# Patient Record
Sex: Female | Born: 1970 | Race: Black or African American | Hispanic: No | Marital: Married
Health system: Southern US, Academic
[De-identification: ages and names within clinical notes are randomized; demographics above are authoritative.]

## PROBLEM LIST (undated history)

## (undated) ENCOUNTER — Telehealth

## (undated) ENCOUNTER — Encounter

## (undated) ENCOUNTER — Ambulatory Visit: Payer: MEDICARE

## (undated) ENCOUNTER — Encounter: Attending: Nephrology | Primary: Nephrology

## (undated) ENCOUNTER — Ambulatory Visit: Payer: Medicare (Managed Care)

## (undated) ENCOUNTER — Ambulatory Visit

## (undated) ENCOUNTER — Other Ambulatory Visit

## (undated) ENCOUNTER — Inpatient Hospital Stay: Payer: Medicare (Managed Care)

## (undated) ENCOUNTER — Institutional Professional Consult (permissible substitution)

## (undated) ENCOUNTER — Inpatient Hospital Stay

## (undated) ENCOUNTER — Encounter: Attending: Adult Health | Primary: Adult Health

## (undated) ENCOUNTER — Ambulatory Visit: Payer: MEDICARE | Attending: Vascular Surgery | Primary: Vascular Surgery

## (undated) ENCOUNTER — Encounter: Attending: Registered Nurse | Primary: Registered Nurse

## (undated) ENCOUNTER — Telehealth: Attending: Internal Medicine | Primary: Internal Medicine

## (undated) ENCOUNTER — Telehealth
Attending: Student in an Organized Health Care Education/Training Program | Primary: Student in an Organized Health Care Education/Training Program

## (undated) ENCOUNTER — Encounter: Attending: Acute Care | Primary: Acute Care

## (undated) ENCOUNTER — Telehealth: Attending: Podiatrist | Primary: Podiatrist

## (undated) ENCOUNTER — Encounter
Payer: MEDICARE | Attending: Rehabilitative and Restorative Service Providers" | Primary: Rehabilitative and Restorative Service Providers"

## (undated) ENCOUNTER — Telehealth: Attending: Vascular Surgery | Primary: Vascular Surgery

## (undated) ENCOUNTER — Encounter
Attending: Student in an Organized Health Care Education/Training Program | Primary: Student in an Organized Health Care Education/Training Program

## (undated) ENCOUNTER — Ambulatory Visit: Attending: Podiatrist | Primary: Podiatrist

## (undated) ENCOUNTER — Telehealth: Attending: Nephrology | Primary: Nephrology

## (undated) ENCOUNTER — Ambulatory Visit: Attending: Adult Health | Primary: Adult Health

## (undated) ENCOUNTER — Encounter: Attending: Internal Medicine | Primary: Internal Medicine

## (undated) ENCOUNTER — Ambulatory Visit: Payer: MEDICARE | Attending: Psychologist | Primary: Psychologist

## (undated) ENCOUNTER — Ambulatory Visit: Attending: Internal Medicine | Primary: Internal Medicine

## (undated) ENCOUNTER — Ambulatory Visit
Payer: MEDICARE | Attending: Rehabilitative and Restorative Service Providers" | Primary: Rehabilitative and Restorative Service Providers"

## (undated) ENCOUNTER — Telehealth: Attending: Community Health | Primary: Community Health

## (undated) ENCOUNTER — Ambulatory Visit: Attending: Urology | Primary: Urology

## (undated) ENCOUNTER — Ambulatory Visit: Payer: MEDICARE | Attending: Obstetrics & Gynecology | Primary: Obstetrics & Gynecology

## (undated) ENCOUNTER — Encounter: Attending: Family | Primary: Family

## (undated) ENCOUNTER — Ambulatory Visit: Attending: Nephrology | Primary: Nephrology

## (undated) ENCOUNTER — Non-Acute Institutional Stay: Payer: MEDICARE

## (undated) ENCOUNTER — Encounter: Attending: Clinical | Primary: Clinical

## (undated) ENCOUNTER — Encounter: Attending: Vascular & Interventional Radiology | Primary: Vascular & Interventional Radiology

## (undated) ENCOUNTER — Encounter: Attending: Physical Medicine & Rehabilitation | Primary: Physical Medicine & Rehabilitation

## (undated) ENCOUNTER — Encounter: Payer: MEDICAID | Attending: Vascular Surgery | Primary: Vascular Surgery

## (undated) ENCOUNTER — Ambulatory Visit
Attending: Rehabilitative and Restorative Service Providers" | Primary: Rehabilitative and Restorative Service Providers"

## (undated) ENCOUNTER — Telehealth: Attending: Family | Primary: Family

## (undated) ENCOUNTER — Ambulatory Visit
Payer: Medicare (Managed Care) | Attending: Student in an Organized Health Care Education/Training Program | Primary: Student in an Organized Health Care Education/Training Program

## (undated) ENCOUNTER — Ambulatory Visit: Payer: MEDICARE | Attending: Clinical | Primary: Clinical

## (undated) ENCOUNTER — Non-Acute Institutional Stay
Payer: MEDICARE | Attending: Rehabilitative and Restorative Service Providers" | Primary: Rehabilitative and Restorative Service Providers"

## (undated) ENCOUNTER — Telehealth: Attending: Surgery | Primary: Surgery

## (undated) MED ORDER — APIXABAN 5 MG TABLET: Freq: Two times a day (BID) | ORAL | 0.00000 days | Status: SS

---
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
## 2017-04-30 ENCOUNTER — Ambulatory Visit
Admission: RE | Admit: 2017-04-30 | Discharge: 2017-04-30 | Disposition: A | Discharge status: Home / Self Care | Payer: MEDICAID | Attending: Nephrology | Admitting: Nephrology

## 2017-04-30 DIAGNOSIS — M549 Dorsalgia, unspecified: Secondary | ICD-10-CM

## 2017-04-30 DIAGNOSIS — N051 Unspecified nephritic syndrome with focal and segmental glomerular lesions: Principal | ICD-10-CM

## 2017-04-30 DIAGNOSIS — N183 Chronic kidney disease, stage 3 (moderate): Secondary | ICD-10-CM

## 2017-04-30 DIAGNOSIS — I150 Renovascular hypertension: Secondary | ICD-10-CM

## 2017-05-01 MED ORDER — LOSARTAN 50 MG TABLET
ORAL_TABLET | Freq: Every day | ORAL | 3 refills | 0.00000 days | Status: CP
Start: 2017-05-01 — End: 2018-05-08

## 2017-05-01 MED ORDER — LOSARTAN 50 MG TABLET: 25 mg | tablet | Freq: Every day | 3 refills | 0 days | Status: AC

## 2017-06-19 ENCOUNTER — Ambulatory Visit
Admit: 2017-06-19 | Discharge: 2017-06-19 | Disposition: A | Discharge status: Home / Self Care | Payer: MEDICAID | Attending: Family | Admitting: Family

## 2017-06-19 DIAGNOSIS — M109 Gout, unspecified: Principal | ICD-10-CM

## 2017-06-19 DIAGNOSIS — N183 Chronic kidney disease, stage 3 (moderate): Secondary | ICD-10-CM

## 2017-06-27 ENCOUNTER — Ambulatory Visit: Admission: RE | Admit: 2017-06-27 | Discharge: 2017-06-27 | Payer: MEDICAID

## 2017-06-27 DIAGNOSIS — L409 Psoriasis, unspecified: Principal | ICD-10-CM

## 2017-07-09 ENCOUNTER — Ambulatory Visit
Admission: RE | Admit: 2017-07-09 | Discharge: 2017-07-09 | Payer: MEDICAID | Attending: Internal Medicine | Admitting: Internal Medicine

## 2017-07-09 DIAGNOSIS — K529 Noninfective gastroenteritis and colitis, unspecified: Principal | ICD-10-CM

## 2017-07-09 DIAGNOSIS — R1013 Epigastric pain: Secondary | ICD-10-CM

## 2017-07-09 MED ORDER — CHOLESTYRAMINE-ASPARTAME 4 GRAM ORAL POWDER FOR SUSP IN A PACKET
PACK | Freq: Three times a day (TID) | ORAL | 3 refills | 0 days | Status: CP
Start: 2017-07-09 — End: 2018-07-01

## 2017-07-09 MED ORDER — HYOSCYAMINE SULFATE 0.125 MG TABLET
ORAL_TABLET | ORAL | 0 refills | 0 days | Status: CP | PRN
Start: 2017-07-09 — End: 2018-05-08

## 2017-08-13 ENCOUNTER — Emergency Department
Admission: EM | Admit: 2017-08-13 | Discharge: 2017-08-13 | Disposition: A | Discharge status: Home / Self Care | Source: Intra-hospital | Attending: Family | Admitting: Family

## 2017-08-13 ENCOUNTER — Emergency Department
Admission: EM | Admit: 2017-08-13 | Discharge: 2017-08-13 | Disposition: A | Discharge status: Home / Self Care | Payer: MEDICAID | Source: Intra-hospital

## 2017-08-13 DIAGNOSIS — R509 Fever, unspecified: Principal | ICD-10-CM

## 2017-08-13 MED ORDER — BENZONATATE 100 MG CAPSULE
ORAL_CAPSULE | Freq: Two times a day (BID) | ORAL | 0 refills | 0 days | Status: CP | PRN
Start: 2017-08-13 — End: 2017-08-20

## 2017-08-28 DIAGNOSIS — R197 Diarrhea, unspecified: Principal | ICD-10-CM

## 2017-08-29 ENCOUNTER — Ambulatory Visit
Admission: RE | Admit: 2017-08-29 | Discharge: 2017-08-29 | Disposition: A | Discharge status: Home / Self Care | Payer: MEDICAID | Attending: Anesthesiology | Admitting: Anesthesiology

## 2017-08-29 ENCOUNTER — Ambulatory Visit
Admission: RE | Admit: 2017-08-29 | Discharge: 2017-08-29 | Disposition: A | Discharge status: Home / Self Care | Payer: MEDICAID | Attending: Internal Medicine | Admitting: Internal Medicine

## 2017-08-29 DIAGNOSIS — R197 Diarrhea, unspecified: Principal | ICD-10-CM

## 2017-09-20 ENCOUNTER — Ambulatory Visit
Admit: 2017-09-20 | Discharge: 2017-09-20 | Disposition: A | Discharge status: Home / Self Care | Payer: MEDICAID | Attending: Registered Nurse | Admitting: Registered Nurse

## 2017-09-20 DIAGNOSIS — I129 Hypertensive chronic kidney disease with stage 1 through stage 4 chronic kidney disease, or unspecified chronic kidney disease: Principal | ICD-10-CM

## 2017-09-30 ENCOUNTER — Ambulatory Visit: Admit: 2017-09-30 | Discharge: 2017-09-30 | Disposition: A | Discharge status: Home / Self Care | Payer: MEDICAID

## 2017-10-09 ENCOUNTER — Ambulatory Visit
Admission: RE | Admit: 2017-10-09 | Discharge: 2017-10-09 | Disposition: A | Discharge status: Home / Self Care | Payer: MEDICAID

## 2017-10-09 DIAGNOSIS — N6452 Nipple discharge: Secondary | ICD-10-CM

## 2017-10-09 DIAGNOSIS — R928 Other abnormal and inconclusive findings on diagnostic imaging of breast: Principal | ICD-10-CM

## 2017-10-09 DIAGNOSIS — N644 Mastodynia: Principal | ICD-10-CM

## 2017-10-09 DIAGNOSIS — Z8543 Personal history of malignant neoplasm of ovary: Secondary | ICD-10-CM

## 2017-10-11 ENCOUNTER — Ambulatory Visit: Admit: 2017-10-11 | Discharge: 2017-10-11 | Payer: MEDICAID

## 2017-10-11 DIAGNOSIS — N644 Mastodynia: Principal | ICD-10-CM

## 2017-11-12 ENCOUNTER — Encounter: Admit: 2017-11-12 | Discharge: 2017-11-12 | Payer: MEDICARE | Attending: Registered Nurse | Primary: Registered Nurse

## 2017-11-12 DIAGNOSIS — I10 Essential (primary) hypertension: Principal | ICD-10-CM

## 2017-11-12 DIAGNOSIS — M109 Gout, unspecified: Secondary | ICD-10-CM

## 2018-01-13 ENCOUNTER — Encounter
Admit: 2018-01-13 | Discharge: 2018-01-13 | Payer: MEDICARE | Attending: Student in an Organized Health Care Education/Training Program | Primary: Student in an Organized Health Care Education/Training Program

## 2018-01-13 ENCOUNTER — Encounter: Admit: 2018-01-13 | Discharge: 2018-01-13 | Payer: MEDICARE

## 2018-01-13 DIAGNOSIS — N183 Chronic kidney disease, stage 3 (moderate): Principal | ICD-10-CM

## 2018-01-13 DIAGNOSIS — N051 Unspecified nephritic syndrome with focal and segmental glomerular lesions: Secondary | ICD-10-CM

## 2018-01-14 ENCOUNTER — Encounter
Admit: 2018-01-14 | Discharge: 2018-01-14 | Disposition: A | Discharge status: Home / Self Care | Payer: MEDICARE | Attending: Family

## 2018-01-14 DIAGNOSIS — S0990XA Unspecified injury of head, initial encounter: Principal | ICD-10-CM

## 2018-02-18 ENCOUNTER — Ambulatory Visit: Admit: 2018-02-18 | Discharge: 2018-02-19 | Payer: MEDICARE | Attending: Internal Medicine | Primary: Internal Medicine

## 2018-02-18 DIAGNOSIS — K580 Irritable bowel syndrome with diarrhea: Secondary | ICD-10-CM

## 2018-02-18 DIAGNOSIS — R109 Unspecified abdominal pain: Principal | ICD-10-CM

## 2018-02-18 MED ORDER — RIFAXIMIN 550 MG TABLET
ORAL_TABLET | Freq: Three times a day (TID) | ORAL | 0 refills | 0 days | Status: CP
Start: 2018-02-18 — End: 2018-03-04

## 2018-03-03 ENCOUNTER — Ambulatory Visit
Admit: 2018-03-03 | Discharge: 2018-03-04 | Payer: MEDICARE | Attending: Student in an Organized Health Care Education/Training Program | Primary: Student in an Organized Health Care Education/Training Program

## 2018-03-03 DIAGNOSIS — N183 Chronic kidney disease, stage 3 (moderate): Principal | ICD-10-CM

## 2018-03-05 MED ORDER — METRONIDAZOLE 500 MG TABLET
ORAL_TABLET | Freq: Two times a day (BID) | ORAL | 0 refills | 0 days | Status: CP
Start: 2018-03-05 — End: 2018-03-15

## 2018-03-07 ENCOUNTER — Non-Acute Institutional Stay: Admit: 2018-03-07 | Discharge: 2018-03-08 | Payer: MEDICARE

## 2018-03-07 ENCOUNTER — Encounter: Admit: 2018-03-07 | Discharge: 2018-03-08 | Payer: MEDICARE

## 2018-03-07 DIAGNOSIS — N183 Chronic kidney disease, stage 3 (moderate): Principal | ICD-10-CM

## 2018-04-23 ENCOUNTER — Ambulatory Visit: Admit: 2018-04-23 | Discharge: 2018-04-23 | Payer: MEDICARE

## 2018-04-23 DIAGNOSIS — R928 Other abnormal and inconclusive findings on diagnostic imaging of breast: Principal | ICD-10-CM

## 2018-05-05 ENCOUNTER — Encounter
Admit: 2018-05-05 | Discharge: 2018-05-05 | Payer: MEDICARE | Attending: Student in an Organized Health Care Education/Training Program | Primary: Student in an Organized Health Care Education/Training Program

## 2018-05-05 ENCOUNTER — Encounter: Admit: 2018-05-05 | Discharge: 2018-05-05 | Payer: MEDICARE

## 2018-05-05 DIAGNOSIS — R300 Dysuria: Secondary | ICD-10-CM

## 2018-05-05 DIAGNOSIS — I10 Essential (primary) hypertension: Secondary | ICD-10-CM

## 2018-05-05 DIAGNOSIS — N183 Chronic kidney disease, stage 3 (moderate): Principal | ICD-10-CM

## 2018-05-05 DIAGNOSIS — N189 Chronic kidney disease, unspecified: Secondary | ICD-10-CM

## 2018-05-05 DIAGNOSIS — N051 Unspecified nephritic syndrome with focal and segmental glomerular lesions: Secondary | ICD-10-CM

## 2018-05-05 MED ORDER — FUROSEMIDE 40 MG TABLET
ORAL_TABLET | Freq: Once | ORAL | 11 refills | 0.00000 days | Status: CP | PRN
Start: 2018-05-05 — End: ?

## 2018-05-08 ENCOUNTER — Encounter: Admit: 2018-05-08 | Discharge: 2018-05-08 | Payer: MEDICARE | Attending: Internal Medicine | Primary: Internal Medicine

## 2018-05-08 DIAGNOSIS — I10 Essential (primary) hypertension: Secondary | ICD-10-CM

## 2018-05-08 DIAGNOSIS — R0609 Other forms of dyspnea: Secondary | ICD-10-CM

## 2018-05-08 DIAGNOSIS — R0789 Other chest pain: Principal | ICD-10-CM

## 2018-05-08 DIAGNOSIS — N183 Chronic kidney disease, stage 3 (moderate): Secondary | ICD-10-CM

## 2018-05-08 MED ORDER — LOSARTAN 100 MG TABLET
ORAL_TABLET | Freq: Every day | ORAL | 3 refills | 0 days | Status: CP
Start: 2018-05-08 — End: 2019-05-08

## 2018-05-08 MED ORDER — ATORVASTATIN 40 MG TABLET
ORAL_TABLET | Freq: Every day | ORAL | 6 refills | 0.00000 days | Status: CP
Start: 2018-05-08 — End: 2018-07-01

## 2018-05-09 ENCOUNTER — Encounter: Admit: 2018-05-09 | Discharge: 2018-05-10 | Payer: MEDICARE

## 2018-05-09 DIAGNOSIS — N183 Chronic kidney disease, stage 3 (moderate): Principal | ICD-10-CM

## 2018-05-19 ENCOUNTER — Encounter: Admit: 2018-05-19 | Discharge: 2018-05-19 | Payer: MEDICARE

## 2018-05-19 ENCOUNTER — Encounter: Admit: 2018-05-19 | Discharge: 2018-06-01 | Payer: MEDICARE

## 2018-05-19 ENCOUNTER — Ambulatory Visit: Admit: 2018-05-19 | Discharge: 2018-05-19 | Payer: MEDICARE

## 2018-05-19 DIAGNOSIS — R0789 Other chest pain: Principal | ICD-10-CM

## 2018-06-10 ENCOUNTER — Encounter: Admit: 2018-06-10 | Discharge: 2018-06-10 | Attending: Registered Nurse | Primary: Registered Nurse

## 2018-07-01 ENCOUNTER — Ambulatory Visit: Admit: 2018-07-01 | Discharge: 2018-07-02 | Attending: Adult Health | Primary: Adult Health

## 2018-07-01 DIAGNOSIS — R079 Chest pain, unspecified: Principal | ICD-10-CM

## 2018-07-01 DIAGNOSIS — I10 Essential (primary) hypertension: Secondary | ICD-10-CM

## 2018-07-01 DIAGNOSIS — R0609 Other forms of dyspnea: Secondary | ICD-10-CM

## 2018-07-01 DIAGNOSIS — Z9189 Other specified personal risk factors, not elsewhere classified: Secondary | ICD-10-CM

## 2018-07-01 MED ORDER — ATORVASTATIN 80 MG TABLET
ORAL_TABLET | Freq: Every day | ORAL | 3 refills | 0 days | Status: CP
Start: 2018-07-01 — End: ?

## 2018-07-01 MED ORDER — METOPROLOL SUCCINATE ER 25 MG TABLET,EXTENDED RELEASE 24 HR
ORAL_TABLET | Freq: Every day | ORAL | 6 refills | 0 days | Status: CP
Start: 2018-07-01 — End: 2018-07-31

## 2018-07-15 ENCOUNTER — Ambulatory Visit: Admit: 2018-07-15 | Discharge: 2018-07-16 | Attending: Podiatrist | Primary: Podiatrist

## 2018-07-15 DIAGNOSIS — Z01818 Encounter for other preprocedural examination: Secondary | ICD-10-CM

## 2018-07-15 DIAGNOSIS — E118 Type 2 diabetes mellitus with unspecified complications: Secondary | ICD-10-CM

## 2018-07-15 DIAGNOSIS — L600 Ingrowing nail: Principal | ICD-10-CM

## 2018-07-15 DIAGNOSIS — L602 Onychogryphosis: Secondary | ICD-10-CM

## 2018-07-20 ENCOUNTER — Ambulatory Visit: Admit: 2018-07-20 | Discharge: 2018-07-22

## 2018-07-20 DIAGNOSIS — Z9189 Other specified personal risk factors, not elsewhere classified: Principal | ICD-10-CM

## 2018-07-31 ENCOUNTER — Ambulatory Visit: Admit: 2018-07-31 | Discharge: 2018-08-01 | Attending: Adult Health | Primary: Adult Health

## 2018-07-31 DIAGNOSIS — N183 Chronic kidney disease, stage 3 (moderate): Secondary | ICD-10-CM

## 2018-07-31 DIAGNOSIS — I10 Essential (primary) hypertension: Secondary | ICD-10-CM

## 2018-07-31 DIAGNOSIS — I208 Other forms of angina pectoris: Principal | ICD-10-CM

## 2018-07-31 DIAGNOSIS — G4733 Obstructive sleep apnea (adult) (pediatric): Secondary | ICD-10-CM

## 2018-07-31 DIAGNOSIS — E785 Hyperlipidemia, unspecified: Secondary | ICD-10-CM

## 2018-07-31 MED ORDER — ISOSORBIDE MONONITRATE ER 30 MG TABLET,EXTENDED RELEASE 24 HR
ORAL_TABLET | Freq: Every day | ORAL | 6 refills | 0 days | Status: CP
Start: 2018-07-31 — End: 2018-09-11

## 2018-07-31 MED ORDER — METOPROLOL SUCCINATE ER 50 MG TABLET,EXTENDED RELEASE 24 HR
ORAL_TABLET | Freq: Every day | ORAL | 6 refills | 0 days | Status: CP
Start: 2018-07-31 — End: 2018-09-11

## 2018-08-05 ENCOUNTER — Encounter: Admit: 2018-08-05 | Discharge: 2018-08-05 | Disposition: A | Discharge status: Home / Self Care | Payer: MEDICARE

## 2018-08-05 DIAGNOSIS — R1031 Right lower quadrant pain: Secondary | ICD-10-CM

## 2018-08-05 DIAGNOSIS — R1032 Left lower quadrant pain: Principal | ICD-10-CM

## 2018-08-14 ENCOUNTER — Ambulatory Visit: Admit: 2018-08-14 | Discharge: 2018-08-15 | Attending: Family | Primary: Family

## 2018-08-14 DIAGNOSIS — G4733 Obstructive sleep apnea (adult) (pediatric): Principal | ICD-10-CM

## 2018-09-09 ENCOUNTER — Ambulatory Visit: Admit: 2018-09-09 | Discharge: 2018-09-09 | Attending: Podiatrist | Primary: Podiatrist

## 2018-09-09 ENCOUNTER — Ambulatory Visit: Admit: 2018-09-09 | Discharge: 2018-09-09

## 2018-09-09 DIAGNOSIS — B351 Tinea unguium: Secondary | ICD-10-CM

## 2018-09-09 DIAGNOSIS — M79675 Pain in left toe(s): Secondary | ICD-10-CM

## 2018-09-09 DIAGNOSIS — L600 Ingrowing nail: Principal | ICD-10-CM

## 2018-09-09 DIAGNOSIS — Z01818 Encounter for other preprocedural examination: Principal | ICD-10-CM

## 2018-09-09 DIAGNOSIS — M79674 Pain in right toe(s): Secondary | ICD-10-CM

## 2018-09-11 ENCOUNTER — Ambulatory Visit: Admit: 2018-09-11 | Discharge: 2018-09-12 | Attending: Adult Health | Primary: Adult Health

## 2018-09-11 DIAGNOSIS — G4733 Obstructive sleep apnea (adult) (pediatric): Secondary | ICD-10-CM

## 2018-09-11 DIAGNOSIS — I208 Other forms of angina pectoris: Secondary | ICD-10-CM

## 2018-09-11 DIAGNOSIS — I10 Essential (primary) hypertension: Secondary | ICD-10-CM

## 2018-09-11 DIAGNOSIS — E785 Hyperlipidemia, unspecified: Principal | ICD-10-CM

## 2018-09-11 MED ORDER — METOPROLOL SUCCINATE ER 100 MG TABLET,EXTENDED RELEASE 24 HR
ORAL_TABLET | Freq: Every day | ORAL | 3 refills | 0.00000 days | Status: CP
Start: 2018-09-11 — End: ?

## 2018-09-11 MED ORDER — ISOSORBIDE MONONITRATE ER 30 MG TABLET,EXTENDED RELEASE 24 HR
ORAL_TABLET | Freq: Every day | ORAL | 3 refills | 0 days | Status: CP
Start: 2018-09-11 — End: 2018-11-06

## 2018-09-12 ENCOUNTER — Ambulatory Visit: Admit: 2018-09-12 | Discharge: 2018-09-13

## 2018-09-12 DIAGNOSIS — E785 Hyperlipidemia, unspecified: Principal | ICD-10-CM

## 2018-09-16 MED ORDER — EZETIMIBE 10 MG TABLET
ORAL_TABLET | Freq: Every day | ORAL | 3 refills | 0 days | Status: CP
Start: 2018-09-16 — End: 2019-09-16

## 2018-09-17 ENCOUNTER — Ambulatory Visit
Admit: 2018-09-17 | Discharge: 2018-09-17 | Attending: Student in an Organized Health Care Education/Training Program | Primary: Student in an Organized Health Care Education/Training Program

## 2018-09-17 ENCOUNTER — Ambulatory Visit: Admit: 2018-09-17 | Discharge: 2018-09-17

## 2018-09-17 DIAGNOSIS — N183 Chronic kidney disease, stage 3 (moderate): Principal | ICD-10-CM

## 2018-09-17 DIAGNOSIS — N184 Chronic kidney disease, stage 4 (severe): Principal | ICD-10-CM

## 2018-09-26 MED ORDER — CHOLECALCIFEROL (VITAMIN D3) 125 MCG (5,000 UNIT) TABLET
ORAL_TABLET | Freq: Every day | ORAL | 3 refills | 0 days | Status: CP
Start: 2018-09-26 — End: 2018-12-22

## 2018-11-03 ENCOUNTER — Encounter: Admit: 2018-11-03 | Discharge: 2018-11-04 | Payer: MEDICARE

## 2018-11-03 DIAGNOSIS — M533 Sacrococcygeal disorders, not elsewhere classified: Secondary | ICD-10-CM

## 2018-11-03 DIAGNOSIS — G8929 Other chronic pain: Principal | ICD-10-CM

## 2018-11-03 MED ORDER — TIZANIDINE 2 MG TABLET
ORAL_TABLET | Freq: Two times a day (BID) | ORAL | 3 refills | 0.00000 days | Status: CP | PRN
Start: 2018-11-03 — End: 2019-02-03

## 2018-11-03 MED ORDER — DICLOFENAC 1 % TOPICAL GEL
Freq: Two times a day (BID) | TOPICAL | 2 refills | 0 days | Status: CP | PRN
Start: 2018-11-03 — End: 2018-11-06

## 2018-11-05 ENCOUNTER — Encounter: Admit: 2018-11-05 | Discharge: 2018-11-06 | Payer: MEDICARE

## 2018-11-05 DIAGNOSIS — R928 Other abnormal and inconclusive findings on diagnostic imaging of breast: Principal | ICD-10-CM

## 2018-11-06 ENCOUNTER — Encounter: Admit: 2018-11-06 | Discharge: 2018-11-07

## 2018-11-06 ENCOUNTER — Ambulatory Visit: Admit: 2018-11-06 | Discharge: 2018-11-07 | Attending: Adult Health | Primary: Adult Health

## 2018-11-06 DIAGNOSIS — R69 Illness, unspecified: Principal | ICD-10-CM

## 2018-11-06 DIAGNOSIS — E782 Mixed hyperlipidemia: Secondary | ICD-10-CM

## 2018-11-06 DIAGNOSIS — G4733 Obstructive sleep apnea (adult) (pediatric): Secondary | ICD-10-CM

## 2018-11-06 DIAGNOSIS — I10 Essential (primary) hypertension: Secondary | ICD-10-CM

## 2018-11-06 DIAGNOSIS — I208 Other forms of angina pectoris: Secondary | ICD-10-CM

## 2018-11-06 MED ORDER — ISOSORBIDE MONONITRATE ER 120 MG TABLET,EXTENDED RELEASE 24 HR
ORAL_TABLET | Freq: Every day | ORAL | 3 refills | 0 days | Status: CP
Start: 2018-11-06 — End: ?

## 2018-11-21 ENCOUNTER — Encounter: Admit: 2018-11-21 | Discharge: 2018-11-21 | Disposition: A | Discharge status: Home / Self Care | Payer: MEDICARE

## 2018-11-21 DIAGNOSIS — M25561 Pain in right knee: Principal | ICD-10-CM

## 2018-11-21 MED ORDER — OXYCODONE 5 MG CAPSULE
ORAL_CAPSULE | ORAL | 0 refills | 0 days | Status: CP | PRN
Start: 2018-11-21 — End: 2018-11-26

## 2018-12-09 ENCOUNTER — Ambulatory Visit: Admit: 2018-12-09 | Discharge: 2018-12-10 | Attending: Podiatrist | Primary: Podiatrist

## 2018-12-09 DIAGNOSIS — L600 Ingrowing nail: Principal | ICD-10-CM

## 2018-12-09 DIAGNOSIS — I7300 Raynaud's syndrome without gangrene: Secondary | ICD-10-CM

## 2018-12-09 DIAGNOSIS — B351 Tinea unguium: Secondary | ICD-10-CM

## 2018-12-09 DIAGNOSIS — M79674 Pain in right toe(s): Secondary | ICD-10-CM

## 2018-12-09 DIAGNOSIS — M79675 Pain in left toe(s): Secondary | ICD-10-CM

## 2018-12-09 MED ORDER — CICLOPIROX 8 % TOPICAL SOLUTION
Freq: Every evening | TOPICAL | 5 refills | 0 days | Status: CP
Start: 2018-12-09 — End: ?

## 2018-12-10 ENCOUNTER — Encounter
Admit: 2018-12-10 | Discharge: 2018-12-10 | Disposition: A | Discharge status: Home / Self Care | Payer: MEDICARE | Attending: Emergency Medicine

## 2018-12-10 DIAGNOSIS — M25569 Pain in unspecified knee: Principal | ICD-10-CM

## 2018-12-10 MED ORDER — OXYCODONE-ACETAMINOPHEN 5 MG-325 MG TABLET
ORAL_TABLET | ORAL | 0 refills | 0 days | Status: CP | PRN
Start: 2018-12-10 — End: 2018-12-15

## 2018-12-10 MED ORDER — LIDOCAINE 4 % TOPICAL PATCH
MEDICATED_PATCH | Freq: Every day | TRANSDERMAL | 0 refills | 0 days | Status: CP
Start: 2018-12-10 — End: 2018-12-20

## 2018-12-22 ENCOUNTER — Ambulatory Visit
Admit: 2018-12-22 | Discharge: 2018-12-23 | Attending: Student in an Organized Health Care Education/Training Program | Primary: Student in an Organized Health Care Education/Training Program

## 2018-12-22 DIAGNOSIS — N19 Unspecified kidney failure: Principal | ICD-10-CM

## 2018-12-22 DIAGNOSIS — R3915 Urgency of urination: Principal | ICD-10-CM

## 2018-12-22 DIAGNOSIS — E7800 Pure hypercholesterolemia, unspecified: Principal | ICD-10-CM

## 2018-12-22 DIAGNOSIS — I10 Essential (primary) hypertension: Principal | ICD-10-CM

## 2018-12-22 DIAGNOSIS — K859 Acute pancreatitis without necrosis or infection, unspecified: Principal | ICD-10-CM

## 2018-12-22 DIAGNOSIS — J45909 Unspecified asthma, uncomplicated: Principal | ICD-10-CM

## 2018-12-22 DIAGNOSIS — N051 Unspecified nephritic syndrome with focal and segmental glomerular lesions: Principal | ICD-10-CM

## 2018-12-22 DIAGNOSIS — R569 Unspecified convulsions: Principal | ICD-10-CM

## 2018-12-22 DIAGNOSIS — Z9089 Acquired absence of other organs: Principal | ICD-10-CM

## 2018-12-22 DIAGNOSIS — R928 Other abnormal and inconclusive findings on diagnostic imaging of breast: Principal | ICD-10-CM

## 2018-12-22 DIAGNOSIS — N184 Chronic kidney disease, stage 4 (severe): Principal | ICD-10-CM

## 2018-12-22 DIAGNOSIS — M549 Dorsalgia, unspecified: Principal | ICD-10-CM

## 2018-12-22 MED ORDER — CHOLECALCIFEROL (VITAMIN D3) 125 MCG (5,000 UNIT) TABLET
ORAL_TABLET | Freq: Every day | ORAL | 3 refills | 0.00000 days | Status: CP
Start: 2018-12-22 — End: 2019-12-22

## 2018-12-22 MED ORDER — CIPROFLOXACIN 250 MG TABLET
ORAL_TABLET | Freq: Two times a day (BID) | ORAL | 0 refills | 0 days | Status: CP
Start: 2018-12-22 — End: 2018-12-27

## 2018-12-23 ENCOUNTER — Ambulatory Visit: Admit: 2018-12-23 | Discharge: 2018-12-24

## 2018-12-23 ENCOUNTER — Ambulatory Visit
Admit: 2018-12-23 | Discharge: 2018-12-24 | Attending: Rehabilitative and Restorative Service Providers" | Primary: Rehabilitative and Restorative Service Providers"

## 2018-12-23 DIAGNOSIS — R3915 Urgency of urination: Principal | ICD-10-CM

## 2018-12-23 DIAGNOSIS — N184 Chronic kidney disease, stage 4 (severe): Principal | ICD-10-CM

## 2018-12-23 DIAGNOSIS — E782 Mixed hyperlipidemia: Principal | ICD-10-CM

## 2018-12-25 ENCOUNTER — Ambulatory Visit: Admit: 2018-12-25 | Discharge: 2018-12-26

## 2018-12-25 DIAGNOSIS — M549 Dorsalgia, unspecified: Principal | ICD-10-CM

## 2018-12-25 DIAGNOSIS — M1990 Unspecified osteoarthritis, unspecified site: Principal | ICD-10-CM

## 2018-12-25 DIAGNOSIS — E7800 Pure hypercholesterolemia, unspecified: Principal | ICD-10-CM

## 2018-12-25 DIAGNOSIS — K219 Gastro-esophageal reflux disease without esophagitis: Principal | ICD-10-CM

## 2018-12-25 DIAGNOSIS — J45909 Unspecified asthma, uncomplicated: Principal | ICD-10-CM

## 2018-12-25 DIAGNOSIS — Z9089 Acquired absence of other organs: Principal | ICD-10-CM

## 2018-12-25 DIAGNOSIS — F419 Anxiety disorder, unspecified: Principal | ICD-10-CM

## 2018-12-25 DIAGNOSIS — N19 Unspecified kidney failure: Principal | ICD-10-CM

## 2018-12-25 DIAGNOSIS — M109 Gout, unspecified: Principal | ICD-10-CM

## 2018-12-25 DIAGNOSIS — I10 Essential (primary) hypertension: Principal | ICD-10-CM

## 2018-12-25 DIAGNOSIS — R928 Other abnormal and inconclusive findings on diagnostic imaging of breast: Principal | ICD-10-CM

## 2018-12-25 DIAGNOSIS — M25361 Other instability, right knee: Principal | ICD-10-CM

## 2018-12-25 DIAGNOSIS — F329 Major depressive disorder, single episode, unspecified: Principal | ICD-10-CM

## 2018-12-25 DIAGNOSIS — R569 Unspecified convulsions: Principal | ICD-10-CM

## 2018-12-25 DIAGNOSIS — K859 Acute pancreatitis without necrosis or infection, unspecified: Principal | ICD-10-CM

## 2018-12-25 DIAGNOSIS — N051 Unspecified nephritic syndrome with focal and segmental glomerular lesions: Principal | ICD-10-CM

## 2019-01-07 DIAGNOSIS — Z9089 Acquired absence of other organs: Principal | ICD-10-CM

## 2019-01-07 DIAGNOSIS — K859 Acute pancreatitis without necrosis or infection, unspecified: Principal | ICD-10-CM

## 2019-01-07 DIAGNOSIS — N19 Unspecified kidney failure: Principal | ICD-10-CM

## 2019-01-07 DIAGNOSIS — M549 Dorsalgia, unspecified: Principal | ICD-10-CM

## 2019-01-07 DIAGNOSIS — M1990 Unspecified osteoarthritis, unspecified site: Principal | ICD-10-CM

## 2019-01-07 DIAGNOSIS — I10 Essential (primary) hypertension: Principal | ICD-10-CM

## 2019-01-07 DIAGNOSIS — J45909 Unspecified asthma, uncomplicated: Principal | ICD-10-CM

## 2019-01-07 DIAGNOSIS — R569 Unspecified convulsions: Principal | ICD-10-CM

## 2019-01-07 DIAGNOSIS — F329 Major depressive disorder, single episode, unspecified: Principal | ICD-10-CM

## 2019-01-07 DIAGNOSIS — M109 Gout, unspecified: Principal | ICD-10-CM

## 2019-01-07 DIAGNOSIS — N051 Unspecified nephritic syndrome with focal and segmental glomerular lesions: Principal | ICD-10-CM

## 2019-01-07 DIAGNOSIS — K219 Gastro-esophageal reflux disease without esophagitis: Principal | ICD-10-CM

## 2019-01-07 DIAGNOSIS — F419 Anxiety disorder, unspecified: Principal | ICD-10-CM

## 2019-01-07 DIAGNOSIS — E7800 Pure hypercholesterolemia, unspecified: Principal | ICD-10-CM

## 2019-01-07 DIAGNOSIS — R928 Other abnormal and inconclusive findings on diagnostic imaging of breast: Principal | ICD-10-CM

## 2019-01-17 ENCOUNTER — Encounter: Admit: 2019-01-17 | Discharge: 2019-01-17 | Disposition: A | Discharge status: Home / Self Care | Payer: MEDICARE

## 2019-01-17 DIAGNOSIS — N189 Chronic kidney disease, unspecified: Principal | ICD-10-CM

## 2019-01-17 DIAGNOSIS — J45909 Unspecified asthma, uncomplicated: Principal | ICD-10-CM

## 2019-01-17 DIAGNOSIS — Z91018 Allergy to other foods: Principal | ICD-10-CM

## 2019-01-17 DIAGNOSIS — J111 Influenza due to unidentified influenza virus with other respiratory manifestations: Principal | ICD-10-CM

## 2019-01-17 DIAGNOSIS — E7800 Pure hypercholesterolemia, unspecified: Principal | ICD-10-CM

## 2019-01-17 DIAGNOSIS — R531 Weakness: Principal | ICD-10-CM

## 2019-01-17 DIAGNOSIS — Z79899 Other long term (current) drug therapy: Principal | ICD-10-CM

## 2019-01-17 DIAGNOSIS — M791 Myalgia, unspecified site: Principal | ICD-10-CM

## 2019-01-17 DIAGNOSIS — Z9221 Personal history of antineoplastic chemotherapy: Principal | ICD-10-CM

## 2019-01-17 DIAGNOSIS — I129 Hypertensive chronic kidney disease with stage 1 through stage 4 chronic kidney disease, or unspecified chronic kidney disease: Principal | ICD-10-CM

## 2019-01-17 DIAGNOSIS — R6889 Other general symptoms and signs: Principal | ICD-10-CM

## 2019-01-17 DIAGNOSIS — Z87891 Personal history of nicotine dependence: Principal | ICD-10-CM

## 2019-01-17 DIAGNOSIS — M6281 Muscle weakness (generalized): Principal | ICD-10-CM

## 2019-01-17 DIAGNOSIS — Z20828 Contact with and (suspected) exposure to other viral communicable diseases: Principal | ICD-10-CM

## 2019-01-17 DIAGNOSIS — R6883 Chills (without fever): Principal | ICD-10-CM

## 2019-01-17 DIAGNOSIS — R05 Cough: Principal | ICD-10-CM

## 2019-01-17 DIAGNOSIS — Z91012 Allergy to eggs: Principal | ICD-10-CM

## 2019-01-17 DIAGNOSIS — R0602 Shortness of breath: Principal | ICD-10-CM

## 2019-01-17 DIAGNOSIS — Z7982 Long term (current) use of aspirin: Principal | ICD-10-CM

## 2019-02-03 DIAGNOSIS — M545 Low back pain: Secondary | ICD-10-CM

## 2019-02-03 DIAGNOSIS — M25561 Pain in right knee: Secondary | ICD-10-CM

## 2019-02-03 DIAGNOSIS — G8929 Other chronic pain: Secondary | ICD-10-CM

## 2019-02-03 DIAGNOSIS — M533 Sacrococcygeal disorders, not elsewhere classified: Secondary | ICD-10-CM

## 2019-02-03 DIAGNOSIS — M25562 Pain in left knee: Secondary | ICD-10-CM

## 2019-02-03 MED ORDER — TIZANIDINE 2 MG TABLET
ORAL_TABLET | Freq: Two times a day (BID) | ORAL | 5 refills | 0 days | Status: CP | PRN
Start: 2019-02-03 — End: ?

## 2019-02-03 MED ORDER — DICLOFENAC 1 % TOPICAL GEL
Freq: Four times a day (QID) | TOPICAL | 5 refills | 0.00000 days | Status: CP | PRN
Start: 2019-02-03 — End: 2020-02-03

## 2019-03-06 ENCOUNTER — Ambulatory Visit
Admit: 2019-03-06 | Discharge: 2019-04-04 | Attending: Rehabilitative and Restorative Service Providers" | Primary: Rehabilitative and Restorative Service Providers"

## 2019-03-06 ENCOUNTER — Telehealth
Admit: 2019-03-06 | Discharge: 2019-04-04 | Attending: Rehabilitative and Restorative Service Providers" | Primary: Rehabilitative and Restorative Service Providers"

## 2019-03-06 DIAGNOSIS — R262 Difficulty in walking, not elsewhere classified: Secondary | ICD-10-CM

## 2019-03-06 DIAGNOSIS — G8929 Other chronic pain: Secondary | ICD-10-CM

## 2019-03-06 DIAGNOSIS — M25551 Pain in right hip: Secondary | ICD-10-CM

## 2019-03-06 DIAGNOSIS — M533 Sacrococcygeal disorders, not elsewhere classified: Secondary | ICD-10-CM

## 2019-03-06 DIAGNOSIS — M25561 Pain in right knee: Secondary | ICD-10-CM

## 2019-03-06 DIAGNOSIS — M25552 Pain in left hip: Secondary | ICD-10-CM

## 2019-03-06 DIAGNOSIS — M25562 Pain in left knee: Secondary | ICD-10-CM

## 2019-04-06 ENCOUNTER — Ambulatory Visit
Admit: 2019-04-06 | Discharge: 2019-05-04 | Attending: Rehabilitative and Restorative Service Providers" | Primary: Rehabilitative and Restorative Service Providers"

## 2019-04-06 ENCOUNTER — Telehealth
Admit: 2019-04-06 | Discharge: 2019-05-04 | Attending: Rehabilitative and Restorative Service Providers" | Primary: Rehabilitative and Restorative Service Providers"

## 2019-04-06 DIAGNOSIS — G8929 Other chronic pain: Secondary | ICD-10-CM

## 2019-04-06 DIAGNOSIS — M25562 Pain in left knee: Secondary | ICD-10-CM

## 2019-04-06 DIAGNOSIS — R262 Difficulty in walking, not elsewhere classified: Secondary | ICD-10-CM

## 2019-04-06 DIAGNOSIS — M25561 Pain in right knee: Secondary | ICD-10-CM

## 2019-04-06 DIAGNOSIS — M25551 Pain in right hip: Secondary | ICD-10-CM

## 2019-04-06 DIAGNOSIS — M25552 Pain in left hip: Secondary | ICD-10-CM

## 2019-04-07 ENCOUNTER — Encounter: Admit: 2019-04-07 | Discharge: 2019-04-08 | Payer: MEDICARE | Attending: Adult Health | Primary: Adult Health

## 2019-04-07 DIAGNOSIS — G4733 Obstructive sleep apnea (adult) (pediatric): Secondary | ICD-10-CM

## 2019-04-07 DIAGNOSIS — N183 Chronic kidney disease, stage 3 (moderate): Secondary | ICD-10-CM

## 2019-04-07 DIAGNOSIS — I10 Essential (primary) hypertension: Secondary | ICD-10-CM

## 2019-04-07 DIAGNOSIS — E785 Hyperlipidemia, unspecified: Secondary | ICD-10-CM

## 2019-04-07 DIAGNOSIS — I208 Other forms of angina pectoris: Principal | ICD-10-CM

## 2019-04-15 ENCOUNTER — Ambulatory Visit: Admit: 2019-04-15 | Discharge: 2019-04-16

## 2019-04-15 DIAGNOSIS — I208 Other forms of angina pectoris: Principal | ICD-10-CM

## 2019-04-17 DIAGNOSIS — M25552 Pain in left hip: Secondary | ICD-10-CM

## 2019-04-17 DIAGNOSIS — M25561 Pain in right knee: Secondary | ICD-10-CM

## 2019-04-17 DIAGNOSIS — M25551 Pain in right hip: Secondary | ICD-10-CM

## 2019-04-17 DIAGNOSIS — M25562 Pain in left knee: Secondary | ICD-10-CM

## 2019-04-17 DIAGNOSIS — G8929 Other chronic pain: Secondary | ICD-10-CM

## 2019-04-19 MED ORDER — AMLODIPINE 5 MG TABLET
ORAL_TABLET | Freq: Every day | ORAL | 3 refills | 0 days | Status: CP
Start: 2019-04-19 — End: 2020-04-18

## 2019-05-13 ENCOUNTER — Telehealth: Admit: 2019-05-13 | Discharge: 2019-05-14 | Payer: MEDICARE

## 2019-05-13 DIAGNOSIS — R3915 Urgency of urination: Secondary | ICD-10-CM

## 2019-05-13 DIAGNOSIS — R300 Dysuria: Secondary | ICD-10-CM

## 2019-05-13 DIAGNOSIS — N3281 Overactive bladder: Principal | ICD-10-CM

## 2019-05-13 MED ORDER — OXYBUTYNIN CHLORIDE 5 MG TABLET: 5 mg | tablet | Freq: Two times a day (BID) | 2 refills | 15 days | Status: AC

## 2019-05-13 MED ORDER — OXYBUTYNIN CHLORIDE 5 MG TABLET
ORAL_TABLET | Freq: Two times a day (BID) | ORAL | 2 refills | 15.00000 days | Status: CP
Start: 2019-05-13 — End: 2019-05-13

## 2019-05-15 MED ORDER — EVOLOCUMAB 140 MG/ML SUBCUTANEOUS PEN INJECTOR
SUBCUTANEOUS | 5 refills | 0 days | Status: CP
Start: 2019-05-15 — End: ?

## 2019-06-02 ENCOUNTER — Ambulatory Visit: Admit: 2019-06-02 | Discharge: 2019-06-03

## 2019-06-02 DIAGNOSIS — R300 Dysuria: Principal | ICD-10-CM

## 2019-06-02 DIAGNOSIS — N952 Postmenopausal atrophic vaginitis: Secondary | ICD-10-CM

## 2019-06-02 DIAGNOSIS — Z8543 Personal history of malignant neoplasm of ovary: Secondary | ICD-10-CM

## 2019-06-02 DIAGNOSIS — R3129 Other microscopic hematuria: Secondary | ICD-10-CM

## 2019-06-11 ENCOUNTER — Encounter
Admit: 2019-06-11 | Discharge: 2019-06-11 | Disposition: A | Discharge status: Home / Self Care | Payer: MEDICARE | Attending: Emergency Medicine

## 2019-06-11 DIAGNOSIS — R1013 Epigastric pain: Principal | ICD-10-CM

## 2019-06-11 DIAGNOSIS — R1900 Intra-abdominal and pelvic swelling, mass and lump, unspecified site: Secondary | ICD-10-CM

## 2019-06-11 DIAGNOSIS — R309 Painful micturition, unspecified: Secondary | ICD-10-CM

## 2019-06-11 MED ORDER — FAMOTIDINE 20 MG TABLET
ORAL_TABLET | Freq: Two times a day (BID) | ORAL | 0 refills | 15.00000 days | Status: CP
Start: 2019-06-11 — End: 2019-06-26

## 2019-06-23 ENCOUNTER — Emergency Department
Admit: 2019-06-23 | Discharge: 2019-06-24 | Disposition: A | Discharge status: Home / Self Care | Payer: MEDICARE | Attending: Family

## 2019-06-23 ENCOUNTER — Encounter
Admit: 2019-06-23 | Discharge: 2019-06-24 | Disposition: A | Discharge status: Home / Self Care | Payer: MEDICARE | Attending: Family

## 2019-06-23 DIAGNOSIS — R1084 Generalized abdominal pain: Secondary | ICD-10-CM

## 2019-06-23 DIAGNOSIS — R197 Diarrhea, unspecified: Secondary | ICD-10-CM

## 2019-06-23 DIAGNOSIS — Z888 Allergy status to other drugs, medicaments and biological substances status: Secondary | ICD-10-CM

## 2019-06-23 DIAGNOSIS — E7800 Pure hypercholesterolemia, unspecified: Secondary | ICD-10-CM

## 2019-06-23 DIAGNOSIS — Z9049 Acquired absence of other specified parts of digestive tract: Secondary | ICD-10-CM

## 2019-06-23 DIAGNOSIS — Z79899 Other long term (current) drug therapy: Secondary | ICD-10-CM

## 2019-06-23 DIAGNOSIS — Z87891 Personal history of nicotine dependence: Secondary | ICD-10-CM

## 2019-06-23 DIAGNOSIS — Z881 Allergy status to other antibiotic agents status: Secondary | ICD-10-CM

## 2019-06-23 DIAGNOSIS — Z91018 Allergy to other foods: Secondary | ICD-10-CM

## 2019-06-23 DIAGNOSIS — I10 Essential (primary) hypertension: Secondary | ICD-10-CM

## 2019-06-23 DIAGNOSIS — K219 Gastro-esophageal reflux disease without esophagitis: Secondary | ICD-10-CM

## 2019-06-23 DIAGNOSIS — D72829 Elevated white blood cell count, unspecified: Secondary | ICD-10-CM

## 2019-06-23 DIAGNOSIS — N051 Unspecified nephritic syndrome with focal and segmental glomerular lesions: Secondary | ICD-10-CM

## 2019-06-23 DIAGNOSIS — F419 Anxiety disorder, unspecified: Secondary | ICD-10-CM

## 2019-06-23 DIAGNOSIS — Z9221 Personal history of antineoplastic chemotherapy: Secondary | ICD-10-CM

## 2019-06-23 DIAGNOSIS — R1912 Hyperactive bowel sounds: Secondary | ICD-10-CM

## 2019-06-23 DIAGNOSIS — E896 Postprocedural adrenocortical (-medullary) hypofunction: Secondary | ICD-10-CM

## 2019-06-23 DIAGNOSIS — E785 Hyperlipidemia, unspecified: Secondary | ICD-10-CM

## 2019-06-23 DIAGNOSIS — R140 Abdominal distension (gaseous): Secondary | ICD-10-CM

## 2019-06-23 DIAGNOSIS — R569 Unspecified convulsions: Secondary | ICD-10-CM

## 2019-06-23 DIAGNOSIS — J45909 Unspecified asthma, uncomplicated: Secondary | ICD-10-CM

## 2019-06-23 DIAGNOSIS — Z91012 Allergy to eggs: Secondary | ICD-10-CM

## 2019-06-23 DIAGNOSIS — R10817 Generalized abdominal tenderness: Secondary | ICD-10-CM

## 2019-06-23 DIAGNOSIS — R110 Nausea: Secondary | ICD-10-CM

## 2019-06-23 DIAGNOSIS — Z7982 Long term (current) use of aspirin: Secondary | ICD-10-CM

## 2019-06-23 DIAGNOSIS — F329 Major depressive disorder, single episode, unspecified: Secondary | ICD-10-CM

## 2019-06-23 DIAGNOSIS — R109 Unspecified abdominal pain: Secondary | ICD-10-CM

## 2019-07-08 ENCOUNTER — Ambulatory Visit: Admit: 2019-07-08 | Discharge: 2019-07-09

## 2019-07-08 DIAGNOSIS — N952 Postmenopausal atrophic vaginitis: Secondary | ICD-10-CM

## 2019-07-08 DIAGNOSIS — Z8543 Personal history of malignant neoplasm of ovary: Secondary | ICD-10-CM

## 2019-07-08 MED ORDER — CLOBETASOL 0.05 % TOPICAL OINTMENT
0 refills | 0 days | Status: CP
Start: 2019-07-08 — End: ?

## 2019-07-09 ENCOUNTER — Ambulatory Visit: Admit: 2019-07-09 | Discharge: 2019-07-10

## 2019-07-09 DIAGNOSIS — L409 Psoriasis, unspecified: Secondary | ICD-10-CM

## 2019-07-09 DIAGNOSIS — N184 Chronic kidney disease, stage 4 (severe): Secondary | ICD-10-CM

## 2019-07-09 DIAGNOSIS — Z79899 Other long term (current) drug therapy: Secondary | ICD-10-CM

## 2019-07-09 MED ORDER — BETAMETHASONE DIPROPIONATE 0.05 % TOPICAL OINTMENT
Freq: Two times a day (BID) | TOPICAL | 2 refills | 0 days | Status: CP
Start: 2019-07-09 — End: 2020-07-08

## 2019-07-13 ENCOUNTER — Ambulatory Visit: Admit: 2019-07-13 | Discharge: 2019-07-13

## 2019-07-13 ENCOUNTER — Ambulatory Visit: Admit: 2019-07-13 | Discharge: 2019-07-13 | Attending: Internal Medicine | Primary: Internal Medicine

## 2019-07-13 DIAGNOSIS — N051 Unspecified nephritic syndrome with focal and segmental glomerular lesions: Secondary | ICD-10-CM

## 2019-07-13 DIAGNOSIS — I10 Essential (primary) hypertension: Secondary | ICD-10-CM

## 2019-07-13 DIAGNOSIS — N184 Chronic kidney disease, stage 4 (severe): Secondary | ICD-10-CM

## 2019-07-14 DIAGNOSIS — L409 Psoriasis, unspecified: Secondary | ICD-10-CM

## 2019-07-14 MED ORDER — ADALIMUMAB 40 MG/0.8 ML SUBCUTANEOUS PEN KIT
SUBCUTANEOUS | 3 refills | 84.00000 days | Status: CP
Start: 2019-07-14 — End: ?

## 2019-07-15 DIAGNOSIS — L409 Psoriasis, unspecified: Secondary | ICD-10-CM

## 2019-07-22 ENCOUNTER — Encounter: Admit: 2019-07-22 | Discharge: 2019-07-23 | Payer: MEDICARE | Attending: Internal Medicine | Primary: Internal Medicine

## 2019-07-22 MED ORDER — DICYCLOMINE 10 MG CAPSULE
ORAL_CAPSULE | Freq: Four times a day (QID) | ORAL | 11 refills | 30.00000 days | Status: CP
Start: 2019-07-22 — End: 2020-07-21

## 2019-07-23 ENCOUNTER — Ambulatory Visit: Admit: 2019-07-23 | Discharge: 2019-07-24

## 2019-07-23 ENCOUNTER — Ambulatory Visit: Admit: 2019-07-23 | Discharge: 2019-07-24 | Attending: Urology | Primary: Urology

## 2019-07-23 DIAGNOSIS — R3129 Other microscopic hematuria: Secondary | ICD-10-CM

## 2019-07-23 DIAGNOSIS — R300 Dysuria: Secondary | ICD-10-CM

## 2019-07-23 DIAGNOSIS — R399 Unspecified symptoms and signs involving the genitourinary system: Secondary | ICD-10-CM

## 2019-08-11 ENCOUNTER — Ambulatory Visit: Admit: 2019-08-11 | Discharge: 2019-08-12 | Attending: Adult Health | Primary: Adult Health

## 2019-08-11 DIAGNOSIS — I208 Other forms of angina pectoris: Principal | ICD-10-CM

## 2019-08-11 DIAGNOSIS — E785 Hyperlipidemia, unspecified: Principal | ICD-10-CM

## 2019-08-11 DIAGNOSIS — I10 Essential (primary) hypertension: Principal | ICD-10-CM

## 2019-08-11 MED ORDER — AMLODIPINE 10 MG TABLET: 10 mg | tablet | Freq: Every day | 3 refills | 90 days | Status: AC

## 2019-08-27 ENCOUNTER — Ambulatory Visit: Admit: 2019-08-27 | Discharge: 2019-08-28 | Attending: Surgery | Primary: Surgery

## 2019-08-27 DIAGNOSIS — N184 Chronic kidney disease, stage 4 (severe): Principal | ICD-10-CM

## 2019-09-01 ENCOUNTER — Encounter: Admit: 2019-09-01 | Discharge: 2019-09-01 | Disposition: A | Discharge status: Home / Self Care | Payer: MEDICARE

## 2019-09-01 ENCOUNTER — Ambulatory Visit: Admit: 2019-09-01 | Discharge: 2019-09-01 | Disposition: A | Discharge status: Home / Self Care | Payer: MEDICARE

## 2019-09-01 MED ORDER — AZITHROMYCIN 250 MG TABLET
ORAL_TABLET | Freq: Every day | ORAL | 0 refills | 4 days | Status: CP
Start: 2019-09-01 — End: 2019-09-05

## 2019-09-01 MED ORDER — AMOXICILLIN 875 MG-POTASSIUM CLAVULANATE 125 MG TABLET
ORAL_TABLET | Freq: Two times a day (BID) | ORAL | 0 refills | 7.00000 days | Status: CP
Start: 2019-09-01 — End: 2019-09-08

## 2019-09-25 DIAGNOSIS — L409 Psoriasis, unspecified: Principal | ICD-10-CM

## 2019-09-25 MED ORDER — TRIAMCINOLONE ACETONIDE 0.1 % TOPICAL OINTMENT
0 refills | 0 days | Status: CP
Start: 2019-09-25 — End: ?

## 2019-10-07 ENCOUNTER — Encounter: Admit: 2019-10-07 | Discharge: 2019-10-07 | Disposition: A | Discharge status: Home / Self Care | Payer: MEDICARE

## 2019-10-07 ENCOUNTER — Emergency Department: Admit: 2019-10-07 | Discharge: 2019-10-07 | Disposition: A | Discharge status: Home / Self Care | Payer: MEDICARE

## 2019-10-07 DIAGNOSIS — W540XXA Bitten by dog, initial encounter: Principal | ICD-10-CM

## 2019-10-07 MED ORDER — AMOXICILLIN 875 MG-POTASSIUM CLAVULANATE 125 MG TABLET
ORAL_TABLET | Freq: Two times a day (BID) | ORAL | 0 refills | 7.00000 days | Status: CP
Start: 2019-10-07 — End: 2019-10-14

## 2019-10-07 MED ORDER — OXYCODONE-ACETAMINOPHEN 5 MG-325 MG TABLET
ORAL_TABLET | ORAL | 0 refills | 1 days | Status: CP | PRN
Start: 2019-10-07 — End: 2019-10-12

## 2019-10-08 ENCOUNTER — Ambulatory Visit: Admit: 2019-10-08 | Discharge: 2019-10-09 | Attending: Surgery | Primary: Surgery

## 2019-10-08 ENCOUNTER — Ambulatory Visit: Admit: 2019-10-08 | Discharge: 2019-10-09

## 2019-10-20 DIAGNOSIS — Z1231 Encounter for screening mammogram for malignant neoplasm of breast: Principal | ICD-10-CM

## 2019-11-10 ENCOUNTER — Ambulatory Visit: Admit: 2019-11-10 | Discharge: 2019-11-11

## 2019-11-11 ENCOUNTER — Ambulatory Visit: Admit: 2019-11-11 | Discharge: 2019-11-12 | Attending: Adult Health | Primary: Adult Health

## 2019-11-11 ENCOUNTER — Ambulatory Visit
Admit: 2019-11-11 | Discharge: 2019-11-12 | Attending: Student in an Organized Health Care Education/Training Program | Primary: Student in an Organized Health Care Education/Training Program

## 2019-11-11 DIAGNOSIS — I10 Essential (primary) hypertension: Principal | ICD-10-CM

## 2019-11-11 DIAGNOSIS — N764 Abscess of vulva: Principal | ICD-10-CM

## 2019-11-11 DIAGNOSIS — E785 Hyperlipidemia, unspecified: Principal | ICD-10-CM

## 2019-11-11 DIAGNOSIS — N184 Chronic kidney disease, stage 4 (severe): Principal | ICD-10-CM

## 2019-11-11 DIAGNOSIS — I208 Other forms of angina pectoris: Principal | ICD-10-CM

## 2019-11-11 MED ORDER — HYDRALAZINE 25 MG TABLET
ORAL_TABLET | Freq: Three times a day (TID) | ORAL | 1 refills | 90 days | Status: CP
Start: 2019-11-11 — End: ?

## 2019-11-11 MED ORDER — NITROGLYCERIN 0.4 MG SUBLINGUAL TABLET
ORAL_TABLET | SUBLINGUAL | 0 refills | 1 days | Status: CP | PRN
Start: 2019-11-11 — End: 2020-11-10

## 2019-11-11 MED ORDER — METOPROLOL SUCCINATE ER 100 MG TABLET,EXTENDED RELEASE 24 HR
ORAL_TABLET | Freq: Two times a day (BID) | ORAL | 3 refills | 90 days | Status: CP
Start: 2019-11-11 — End: ?

## 2019-11-26 ENCOUNTER — Encounter
Admit: 2019-11-26 | Discharge: 2019-11-27 | Payer: MEDICARE | Attending: Student in an Organized Health Care Education/Training Program | Primary: Student in an Organized Health Care Education/Training Program

## 2019-11-26 DIAGNOSIS — L900 Lichen sclerosus et atrophicus: Principal | ICD-10-CM

## 2019-11-26 DIAGNOSIS — N941 Unspecified dyspareunia: Principal | ICD-10-CM

## 2019-11-26 DIAGNOSIS — N951 Menopausal and female climacteric states: Principal | ICD-10-CM

## 2019-11-26 MED ORDER — TELMISARTAN 20 MG TABLET
ORAL_TABLET | Freq: Every day | ORAL | 3 refills | 90.00000 days | Status: CP
Start: 2019-11-26 — End: 2020-11-25

## 2019-11-26 MED ORDER — PAROXETINE 10 MG TABLET
ORAL_TABLET | Freq: Every day | ORAL | 2 refills | 30.00000 days | Status: CP
Start: 2019-11-26 — End: 2020-11-25

## 2019-11-26 MED ORDER — ESTRADIOL 0.01% (0.1 MG/GRAM) VAGINAL CREAM
Freq: Every day | VAGINAL | 1 refills | 85.00000 days | Status: CP
Start: 2019-11-26 — End: 2020-11-25

## 2019-11-26 MED ORDER — TELMISARTAN 20 MG TABLET: 20 mg | tablet | Freq: Every day | 3 refills | 90 days | Status: AC

## 2019-12-09 ENCOUNTER — Encounter: Admit: 2019-12-09 | Discharge: 2019-12-10 | Payer: MEDICARE | Attending: Adult Health | Primary: Adult Health

## 2019-12-09 ENCOUNTER — Encounter: Admit: 2019-12-09 | Discharge: 2019-12-10 | Disposition: A | Discharge status: Home / Self Care | Payer: MEDICARE

## 2019-12-09 DIAGNOSIS — L0390 Cellulitis, unspecified: Principal | ICD-10-CM

## 2019-12-09 MED ORDER — TELMISARTAN 40 MG TABLET
ORAL_TABLET | Freq: Every day | ORAL | 3 refills | 90 days | Status: CP
Start: 2019-12-09 — End: 2020-12-08

## 2019-12-09 MED ORDER — DOXYCYCLINE HYCLATE 100 MG CAPSULE
ORAL_CAPSULE | Freq: Two times a day (BID) | ORAL | 0 refills | 10 days | Status: CP
Start: 2019-12-09 — End: 2019-12-19

## 2019-12-14 ENCOUNTER — Ambulatory Visit: Admit: 2019-12-14 | Discharge: 2019-12-14 | Attending: Internal Medicine | Primary: Internal Medicine

## 2019-12-14 ENCOUNTER — Ambulatory Visit: Admit: 2019-12-14 | Discharge: 2019-12-14

## 2019-12-14 DIAGNOSIS — N184 Chronic kidney disease, stage 4 (severe): Principal | ICD-10-CM

## 2019-12-15 DIAGNOSIS — N186 End stage renal disease: Principal | ICD-10-CM

## 2019-12-16 MED ORDER — ERGOCALCIFEROL (VITAMIN D2) 1,250 MCG (50,000 UNIT) CAPSULE
ORAL_CAPSULE | ORAL | 11 refills | 28.00000 days | Status: CP
Start: 2019-12-16 — End: 2020-12-15

## 2019-12-17 DIAGNOSIS — N186 End stage renal disease: Principal | ICD-10-CM

## 2019-12-25 ENCOUNTER — Ambulatory Visit: Admit: 2019-12-25 | Discharge: 2019-12-26 | Attending: Obstetrics & Gynecology | Primary: Obstetrics & Gynecology

## 2019-12-25 DIAGNOSIS — N952 Postmenopausal atrophic vaginitis: Principal | ICD-10-CM

## 2019-12-25 DIAGNOSIS — N951 Menopausal and female climacteric states: Principal | ICD-10-CM

## 2020-01-27 ENCOUNTER — Encounter: Admit: 2020-01-27 | Discharge: 2020-01-28 | Payer: MEDICARE

## 2020-01-29 ENCOUNTER — Encounter
Admit: 2020-01-29 | Discharge: 2020-01-29 | Payer: MEDICARE | Attending: Student in an Organized Health Care Education/Training Program | Primary: Student in an Organized Health Care Education/Training Program

## 2020-01-29 ENCOUNTER — Ambulatory Visit: Admit: 2020-01-29 | Discharge: 2020-01-29 | Payer: MEDICARE

## 2020-01-29 MED ORDER — TRAMADOL 50 MG TABLET
ORAL_TABLET | Freq: Four times a day (QID) | ORAL | 0 refills | 2.00000 days | Status: CP
Start: 2020-01-29 — End: 2020-01-31
  Filled 2020-01-29: qty 5, 2d supply, fill #0

## 2020-01-29 MED FILL — TRAMADOL 50 MG TABLET: 2 days supply | Qty: 5 | Fill #0 | Status: AC

## 2020-02-11 ENCOUNTER — Ambulatory Visit: Admit: 2020-02-11 | Discharge: 2020-02-12

## 2020-02-25 ENCOUNTER — Ambulatory Visit: Admit: 2020-02-25 | Discharge: 2020-02-26 | Payer: MEDICARE

## 2020-02-29 ENCOUNTER — Encounter
Admit: 2020-02-29 | Discharge: 2020-03-01 | Disposition: A | Discharge status: Home / Self Care | Payer: MEDICARE | Attending: Emergency Medicine

## 2020-02-29 DIAGNOSIS — N764 Abscess of vulva: Principal | ICD-10-CM

## 2020-02-29 MED ORDER — OXYCODONE 5 MG TABLET
ORAL_TABLET | Freq: Four times a day (QID) | ORAL | 0 refills | 2 days | Status: CP | PRN
Start: 2020-02-29 — End: 2020-03-02

## 2020-02-29 MED ORDER — DOXYCYCLINE HYCLATE 100 MG CAPSULE
ORAL_CAPSULE | Freq: Two times a day (BID) | ORAL | 0 refills | 7 days | Status: CP
Start: 2020-02-29 — End: 2020-03-07

## 2020-03-24 ENCOUNTER — Ambulatory Visit: Admit: 2020-03-24 | Discharge: 2020-03-25 | Payer: MEDICARE

## 2020-03-24 DIAGNOSIS — M7989 Other specified soft tissue disorders: Principal | ICD-10-CM

## 2020-03-28 ENCOUNTER — Ambulatory Visit: Admit: 2020-03-28 | Discharge: 2020-03-29 | Payer: MEDICARE

## 2020-03-28 DIAGNOSIS — I10 Essential (primary) hypertension: Principal | ICD-10-CM

## 2020-03-28 DIAGNOSIS — N185 Chronic kidney disease, stage 5: Principal | ICD-10-CM

## 2020-03-28 DIAGNOSIS — N184 Chronic kidney disease, stage 4 (severe): Principal | ICD-10-CM

## 2020-03-29 DIAGNOSIS — N186 End stage renal disease: Principal | ICD-10-CM

## 2020-04-12 MED ORDER — GABAPENTIN 100 MG CAPSULE
ORAL_CAPSULE | Freq: Three times a day (TID) | ORAL | 2 refills | 30 days | Status: CP
Start: 2020-04-12 — End: 2021-04-12

## 2020-04-21 MED ORDER — AMLODIPINE 10 MG TABLET
ORAL_TABLET | Freq: Every day | ORAL | 3 refills | 90.00000 days | Status: CP
Start: 2020-04-21 — End: 2021-04-21

## 2020-06-06 ENCOUNTER — Ambulatory Visit
Admit: 2020-06-06 | Discharge: 2020-06-07 | Payer: MEDICARE | Attending: Physician Assistant | Primary: Physician Assistant

## 2020-06-08 ENCOUNTER — Encounter: Admit: 2020-06-08 | Discharge: 2020-06-08 | Payer: MEDICARE

## 2020-06-08 ENCOUNTER — Ambulatory Visit: Admit: 2020-06-08 | Discharge: 2020-06-08 | Payer: MEDICARE

## 2020-06-08 MED ORDER — OXYCODONE 5 MG TABLET
ORAL_TABLET | ORAL | 0 refills | 2.00000 days | Status: CP | PRN
Start: 2020-06-08 — End: 2020-06-13

## 2020-06-08 MED ORDER — DOXYCYCLINE HYCLATE 100 MG CAPSULE
ORAL_CAPSULE | Freq: Two times a day (BID) | ORAL | 0 refills | 10.00000 days | Status: CP
Start: 2020-06-08 — End: 2020-06-18

## 2020-06-16 ENCOUNTER — Ambulatory Visit: Admit: 2020-06-16 | Discharge: 2020-06-17 | Payer: MEDICARE

## 2020-06-16 DIAGNOSIS — E782 Mixed hyperlipidemia: Principal | ICD-10-CM

## 2020-06-16 DIAGNOSIS — I10 Essential (primary) hypertension: Principal | ICD-10-CM

## 2020-06-16 DIAGNOSIS — R079 Chest pain, unspecified: Principal | ICD-10-CM

## 2020-06-23 ENCOUNTER — Institutional Professional Consult (permissible substitution): Admit: 2020-06-23 | Discharge: 2020-06-24 | Payer: MEDICARE

## 2020-06-23 DIAGNOSIS — N186 End stage renal disease: Principal | ICD-10-CM

## 2020-06-24 ENCOUNTER — Ambulatory Visit: Admit: 2020-06-24 | Discharge: 2020-06-25 | Payer: MEDICARE

## 2020-06-24 ENCOUNTER — Ambulatory Visit: Admit: 2020-06-24 | Discharge: 2020-07-07 | Payer: MEDICARE

## 2020-06-24 ENCOUNTER — Encounter: Admit: 2020-06-24 | Discharge: 2020-06-25 | Payer: MEDICARE

## 2020-06-24 DIAGNOSIS — R079 Chest pain, unspecified: Principal | ICD-10-CM

## 2020-06-29 ENCOUNTER — Ambulatory Visit: Admit: 2020-06-29 | Discharge: 2020-06-29 | Payer: MEDICARE

## 2020-06-29 ENCOUNTER — Ambulatory Visit: Admit: 2020-06-29 | Discharge: 2020-06-29 | Payer: MEDICARE | Attending: Internal Medicine | Primary: Internal Medicine

## 2020-06-29 DIAGNOSIS — N185 Chronic kidney disease, stage 5: Principal | ICD-10-CM

## 2020-06-29 DIAGNOSIS — I10 Essential (primary) hypertension: Principal | ICD-10-CM

## 2020-06-29 DIAGNOSIS — N184 Chronic kidney disease, stage 4 (severe): Principal | ICD-10-CM

## 2020-06-30 ENCOUNTER — Ambulatory Visit: Admit: 2020-06-30 | Discharge: 2020-07-01 | Payer: MEDICARE | Attending: Surgery | Primary: Surgery

## 2020-06-30 DIAGNOSIS — N186 End stage renal disease: Principal | ICD-10-CM

## 2020-07-14 ENCOUNTER — Encounter: Admit: 2020-07-14 | Payer: MEDICARE | Attending: Nephrology | Primary: Nephrology

## 2020-07-14 MED ORDER — OXYCODONE 5 MG TABLET
ORAL_TABLET | ORAL | 0 refills | 3.00000 days | Status: CP | PRN
Start: 2020-07-14 — End: 2020-07-19

## 2020-07-21 ENCOUNTER — Ambulatory Visit
Admit: 2020-07-21 | Discharge: 2020-07-22 | Payer: MEDICARE | Attending: Physician Assistant | Primary: Physician Assistant

## 2020-07-30 ENCOUNTER — Encounter
Admit: 2020-07-30 | Discharge: 2020-08-20 | Disposition: A | Discharge status: Rehabilitation Facility | Payer: MEDICARE | Attending: Student in an Organized Health Care Education/Training Program

## 2020-07-30 ENCOUNTER — Ambulatory Visit: Admit: 2020-07-30 | Discharge: 2020-08-20 | Disposition: A | Discharge status: Rehabilitation Facility | Payer: MEDICARE

## 2020-08-19 MED ORDER — ONDANSETRON 4 MG DISINTEGRATING TABLET
ORAL_TABLET | Freq: Every day | ORAL | 0 refills | 30 days | PRN
Start: 2020-08-19 — End: ?

## 2020-08-19 MED ORDER — APIXABAN 5 MG TABLET
ORAL_TABLET | Freq: Two times a day (BID) | ORAL | 0 refills | 45.00000 days
Start: 2020-08-19 — End: ?

## 2020-08-19 MED ORDER — METRONIDAZOLE 500 MG TABLET
ORAL_TABLET | Freq: Three times a day (TID) | ORAL | 0 refills | 11 days
Start: 2020-08-19 — End: 2020-08-30

## 2020-08-20 ENCOUNTER — Ambulatory Visit
Admission: TF | Admit: 2020-08-20 | Discharge: 2020-08-28 | Disposition: A | Discharge status: Home / Self Care | Payer: MEDICARE | Source: Intra-hospital | Admitting: Physical Medicine & Rehabilitation

## 2020-08-20 ENCOUNTER — Ambulatory Visit
Admission: TF | Admit: 2020-08-20 | Discharge: 2020-08-28 | Disposition: A | Discharge status: Home / Self Care | Payer: MEDICAID | Source: Intra-hospital | Admitting: Physical Medicine & Rehabilitation

## 2020-08-20 MED ORDER — CEFTRIAXONE IVPB 1 GRAM CONNECTOR BAG
INTRAVENOUS | 0 refills | 10 days
Start: 2020-08-20 — End: 2020-08-30

## 2020-08-20 MED ORDER — ALLOPURINOL 100 MG TABLET
ORAL_TABLET | ORAL | 11 refills | 28 days
Start: 2020-08-20 — End: 2021-08-20

## 2020-08-21 ENCOUNTER — Ambulatory Visit: Admit: 2020-08-21 | Discharge: 2020-08-22 | Payer: MEDICARE

## 2020-08-23 DIAGNOSIS — F419 Anxiety disorder, unspecified: Principal | ICD-10-CM

## 2020-08-23 DIAGNOSIS — I953 Hypotension of hemodialysis: Principal | ICD-10-CM

## 2020-08-23 DIAGNOSIS — L089 Local infection of the skin and subcutaneous tissue, unspecified: Principal | ICD-10-CM

## 2020-08-23 DIAGNOSIS — I70228 Atherosclerosis of native arteries of extremities with rest pain, other extremity: Principal | ICD-10-CM

## 2020-08-23 DIAGNOSIS — R29898 Other symptoms and signs involving the musculoskeletal system: Principal | ICD-10-CM

## 2020-08-23 DIAGNOSIS — K219 Gastro-esophageal reflux disease without esophagitis: Principal | ICD-10-CM

## 2020-08-23 DIAGNOSIS — Z992 Dependence on renal dialysis: Principal | ICD-10-CM

## 2020-08-23 DIAGNOSIS — N186 End stage renal disease: Principal | ICD-10-CM

## 2020-08-23 DIAGNOSIS — E7800 Pure hypercholesterolemia, unspecified: Principal | ICD-10-CM

## 2020-08-23 DIAGNOSIS — R200 Anesthesia of skin: Principal | ICD-10-CM

## 2020-08-23 DIAGNOSIS — R5381 Other malaise: Principal | ICD-10-CM

## 2020-08-23 DIAGNOSIS — E785 Hyperlipidemia, unspecified: Principal | ICD-10-CM

## 2020-08-23 DIAGNOSIS — Z7982 Long term (current) use of aspirin: Principal | ICD-10-CM

## 2020-08-23 DIAGNOSIS — I120 Hypertensive chronic kidney disease with stage 5 chronic kidney disease or end stage renal disease: Principal | ICD-10-CM

## 2020-08-23 DIAGNOSIS — D638 Anemia in other chronic diseases classified elsewhere: Principal | ICD-10-CM

## 2020-08-23 DIAGNOSIS — T82868A Thrombosis of vascular prosthetic devices, implants and grafts, initial encounter: Principal | ICD-10-CM

## 2020-08-23 DIAGNOSIS — T82868D Thrombosis of vascular prosthetic devices, implants and grafts, subsequent encounter: Principal | ICD-10-CM

## 2020-08-23 DIAGNOSIS — Z7901 Long term (current) use of anticoagulants: Principal | ICD-10-CM

## 2020-08-23 DIAGNOSIS — J45909 Unspecified asthma, uncomplicated: Principal | ICD-10-CM

## 2020-08-23 DIAGNOSIS — Z87891 Personal history of nicotine dependence: Principal | ICD-10-CM

## 2020-08-25 DIAGNOSIS — Z992 Dependence on renal dialysis: Principal | ICD-10-CM

## 2020-08-25 DIAGNOSIS — Z7982 Long term (current) use of aspirin: Principal | ICD-10-CM

## 2020-08-25 DIAGNOSIS — L089 Local infection of the skin and subcutaneous tissue, unspecified: Principal | ICD-10-CM

## 2020-08-25 DIAGNOSIS — R200 Anesthesia of skin: Principal | ICD-10-CM

## 2020-08-25 DIAGNOSIS — E7800 Pure hypercholesterolemia, unspecified: Principal | ICD-10-CM

## 2020-08-25 DIAGNOSIS — R5381 Other malaise: Principal | ICD-10-CM

## 2020-08-25 DIAGNOSIS — Z87891 Personal history of nicotine dependence: Principal | ICD-10-CM

## 2020-08-25 DIAGNOSIS — K219 Gastro-esophageal reflux disease without esophagitis: Principal | ICD-10-CM

## 2020-08-25 DIAGNOSIS — N186 End stage renal disease: Principal | ICD-10-CM

## 2020-08-25 DIAGNOSIS — R29898 Other symptoms and signs involving the musculoskeletal system: Principal | ICD-10-CM

## 2020-08-25 DIAGNOSIS — I953 Hypotension of hemodialysis: Principal | ICD-10-CM

## 2020-08-25 DIAGNOSIS — F419 Anxiety disorder, unspecified: Principal | ICD-10-CM

## 2020-08-25 DIAGNOSIS — T82868D Thrombosis of vascular prosthetic devices, implants and grafts, subsequent encounter: Principal | ICD-10-CM

## 2020-08-25 DIAGNOSIS — E785 Hyperlipidemia, unspecified: Principal | ICD-10-CM

## 2020-08-25 DIAGNOSIS — J45909 Unspecified asthma, uncomplicated: Principal | ICD-10-CM

## 2020-08-25 DIAGNOSIS — D638 Anemia in other chronic diseases classified elsewhere: Principal | ICD-10-CM

## 2020-08-25 DIAGNOSIS — I120 Hypertensive chronic kidney disease with stage 5 chronic kidney disease or end stage renal disease: Principal | ICD-10-CM

## 2020-08-25 DIAGNOSIS — Z7901 Long term (current) use of anticoagulants: Principal | ICD-10-CM

## 2020-08-25 DIAGNOSIS — T82868A Thrombosis of vascular prosthetic devices, implants and grafts, initial encounter: Principal | ICD-10-CM

## 2020-08-26 MED ORDER — PAROXETINE 10 MG TABLET
ORAL_TABLET | Freq: Every evening | ORAL | 0 refills | 30.00000 days | Status: CP
Start: 2020-08-26 — End: 2020-09-27
  Filled 2020-08-28: qty 30, 30d supply, fill #0

## 2020-08-26 MED ORDER — ATORVASTATIN 80 MG TABLET
ORAL_TABLET | Freq: Every day | ORAL | 0 refills | 30.00000 days | Status: CP
Start: 2020-08-26 — End: 2020-09-25
  Filled 2020-08-28: qty 30, 30d supply, fill #0

## 2020-08-26 MED ORDER — GABAPENTIN 100 MG CAPSULE
ORAL_CAPSULE | Freq: Three times a day (TID) | ORAL | 0 refills | 30 days | Status: CP
Start: 2020-08-26 — End: 2020-09-25
  Filled 2020-08-28: qty 90, 30d supply, fill #0

## 2020-08-26 MED ORDER — APIXABAN 5 MG TABLET
ORAL_TABLET | Freq: Two times a day (BID) | ORAL | 0 refills | 70.00000 days | Status: CP
Start: 2020-08-26 — End: 2020-11-04
  Filled 2020-08-28: qty 60, 30d supply, fill #0

## 2020-08-27 DIAGNOSIS — Z992 Dependence on renal dialysis: Principal | ICD-10-CM

## 2020-08-27 DIAGNOSIS — Z7901 Long term (current) use of anticoagulants: Principal | ICD-10-CM

## 2020-08-27 DIAGNOSIS — F419 Anxiety disorder, unspecified: Principal | ICD-10-CM

## 2020-08-27 DIAGNOSIS — N186 End stage renal disease: Principal | ICD-10-CM

## 2020-08-27 DIAGNOSIS — D638 Anemia in other chronic diseases classified elsewhere: Principal | ICD-10-CM

## 2020-08-27 DIAGNOSIS — R200 Anesthesia of skin: Principal | ICD-10-CM

## 2020-08-27 DIAGNOSIS — E785 Hyperlipidemia, unspecified: Principal | ICD-10-CM

## 2020-08-27 DIAGNOSIS — K219 Gastro-esophageal reflux disease without esophagitis: Principal | ICD-10-CM

## 2020-08-27 DIAGNOSIS — I120 Hypertensive chronic kidney disease with stage 5 chronic kidney disease or end stage renal disease: Principal | ICD-10-CM

## 2020-08-27 DIAGNOSIS — L089 Local infection of the skin and subcutaneous tissue, unspecified: Principal | ICD-10-CM

## 2020-08-27 DIAGNOSIS — Z87891 Personal history of nicotine dependence: Principal | ICD-10-CM

## 2020-08-27 DIAGNOSIS — I953 Hypotension of hemodialysis: Principal | ICD-10-CM

## 2020-08-27 DIAGNOSIS — J45909 Unspecified asthma, uncomplicated: Principal | ICD-10-CM

## 2020-08-27 DIAGNOSIS — T82868D Thrombosis of vascular prosthetic devices, implants and grafts, subsequent encounter: Principal | ICD-10-CM

## 2020-08-27 DIAGNOSIS — E7800 Pure hypercholesterolemia, unspecified: Principal | ICD-10-CM

## 2020-08-27 DIAGNOSIS — R5381 Other malaise: Principal | ICD-10-CM

## 2020-08-27 DIAGNOSIS — R29898 Other symptoms and signs involving the musculoskeletal system: Principal | ICD-10-CM

## 2020-08-27 DIAGNOSIS — Z7982 Long term (current) use of aspirin: Principal | ICD-10-CM

## 2020-08-27 MED ORDER — ISOSORBIDE MONONITRATE ER 120 MG TABLET,EXTENDED RELEASE 24 HR
ORAL_TABLET | Freq: Every day | ORAL | 0 refills | 30.00000 days | Status: CP
Start: 2020-08-27 — End: 2020-09-25
  Filled 2020-08-28: qty 30, 30d supply, fill #0

## 2020-08-27 MED ORDER — PANTOPRAZOLE 20 MG TABLET,DELAYED RELEASE
ORAL_TABLET | Freq: Every day | ORAL | 0 refills | 30.00000 days | Status: CP
Start: 2020-08-27 — End: 2020-09-26
  Filled 2020-08-28: qty 30, 30d supply, fill #0

## 2020-08-27 MED ORDER — ALLOPURINOL 100 MG TABLET
ORAL_TABLET | ORAL | 0 refills | 28 days | Status: CP
Start: 2020-08-27 — End: 2020-09-24
  Filled 2020-08-28: qty 12, 28d supply, fill #0

## 2020-08-27 MED ORDER — AMLODIPINE 10 MG TABLET
ORAL_TABLET | Freq: Every day | ORAL | 3 refills | 90.00000 days | Status: CP
Start: 2020-08-27 — End: 2021-08-27
  Filled 2020-10-04: qty 90, 90d supply, fill #0

## 2020-08-27 MED ORDER — ASPIRIN 81 MG CHEWABLE TABLET
ORAL_TABLET | Freq: Every day | ORAL | 0 refills | 36.00000 days | Status: CP
Start: 2020-08-27 — End: 2020-10-01
  Filled 2020-08-28: qty 36, 36d supply, fill #0

## 2020-08-28 MED FILL — ALLOPURINOL 100 MG TABLET: 28 days supply | Qty: 12 | Fill #0 | Status: AC

## 2020-08-28 MED FILL — ELIQUIS 5 MG TABLET: 30 days supply | Qty: 60 | Fill #0 | Status: AC

## 2020-08-28 MED FILL — ATORVASTATIN 80 MG TABLET: 30 days supply | Qty: 30 | Fill #0 | Status: AC

## 2020-08-28 MED FILL — PAROXETINE 10 MG TABLET: 30 days supply | Qty: 30 | Fill #0 | Status: AC

## 2020-08-28 MED FILL — ISOSORBIDE MONONITRATE ER 120 MG TABLET,EXTENDED RELEASE 24 HR: 30 days supply | Qty: 30 | Fill #0 | Status: AC

## 2020-08-28 MED FILL — PANTOPRAZOLE 20 MG TABLET,DELAYED RELEASE: 30 days supply | Qty: 30 | Fill #0 | Status: AC

## 2020-08-28 MED FILL — GABAPENTIN 100 MG CAPSULE: 30 days supply | Qty: 90 | Fill #0 | Status: AC

## 2020-08-28 MED FILL — ASPIRIN 81 MG CHEWABLE TABLET: 36 days supply | Qty: 36 | Fill #0 | Status: AC

## 2020-08-30 DIAGNOSIS — N051 Unspecified nephritic syndrome with focal and segmental glomerular lesions: Principal | ICD-10-CM

## 2020-08-30 DIAGNOSIS — N186 End stage renal disease: Principal | ICD-10-CM

## 2020-08-30 DIAGNOSIS — Z992 Dependence on renal dialysis: Principal | ICD-10-CM

## 2020-09-02 ENCOUNTER — Ambulatory Visit: Admit: 2020-09-02 | Discharge: 2020-09-03 | Payer: MEDICARE | Attending: Vascular Surgery | Primary: Vascular Surgery

## 2020-09-02 DIAGNOSIS — I70208 Unspecified atherosclerosis of native arteries of extremities, other extremity: Principal | ICD-10-CM

## 2020-09-07 ENCOUNTER — Ambulatory Visit: Admit: 2020-09-07 | Discharge: 2020-09-08 | Payer: MEDICARE

## 2020-09-07 ENCOUNTER — Encounter: Admit: 2020-09-07 | Discharge: 2020-09-08 | Payer: MEDICARE

## 2020-09-07 ENCOUNTER — Non-Acute Institutional Stay: Admit: 2020-09-07 | Discharge: 2020-09-08 | Payer: MEDICARE

## 2020-09-07 ENCOUNTER — Encounter: Admit: 2020-09-07 | Discharge: 2020-09-08 | Payer: MEDICARE | Attending: Vascular Surgery | Primary: Vascular Surgery

## 2020-09-07 DIAGNOSIS — Z87891 Personal history of nicotine dependence: Principal | ICD-10-CM

## 2020-09-07 DIAGNOSIS — I120 Hypertensive chronic kidney disease with stage 5 chronic kidney disease or end stage renal disease: Principal | ICD-10-CM

## 2020-09-07 DIAGNOSIS — I70228 Atherosclerosis of native arteries of extremities with rest pain, other extremity: Principal | ICD-10-CM

## 2020-09-07 DIAGNOSIS — N186 End stage renal disease: Principal | ICD-10-CM

## 2020-09-07 DIAGNOSIS — E785 Hyperlipidemia, unspecified: Principal | ICD-10-CM

## 2020-09-14 ENCOUNTER — Encounter: Admit: 2020-09-14 | Discharge: 2020-09-15 | Payer: MEDICARE | Attending: Vascular Surgery | Primary: Vascular Surgery

## 2020-09-14 DIAGNOSIS — Z09 Encounter for follow-up examination after completed treatment for conditions other than malignant neoplasm: Principal | ICD-10-CM

## 2020-09-14 DIAGNOSIS — N186 End stage renal disease: Principal | ICD-10-CM

## 2020-09-14 DIAGNOSIS — Z992 Dependence on renal dialysis: Principal | ICD-10-CM

## 2020-09-14 DIAGNOSIS — I998 Other disorder of circulatory system: Principal | ICD-10-CM

## 2020-09-18 ENCOUNTER — Encounter
Admit: 2020-09-18 | Discharge: 2020-09-19 | Disposition: A | Discharge status: Home / Self Care | Payer: MEDICARE | Attending: Emergency Medicine

## 2020-09-18 ENCOUNTER — Ambulatory Visit
Admit: 2020-09-18 | Discharge: 2020-09-19 | Disposition: A | Discharge status: Home / Self Care | Payer: MEDICAID | Attending: Emergency Medicine

## 2020-09-18 DIAGNOSIS — Z959 Presence of cardiac and vascular implant and graft, unspecified: Principal | ICD-10-CM

## 2020-09-18 DIAGNOSIS — N186 End stage renal disease: Principal | ICD-10-CM

## 2020-09-18 DIAGNOSIS — G40909 Epilepsy, unspecified, not intractable, without status epilepticus: Principal | ICD-10-CM

## 2020-09-18 DIAGNOSIS — Z7901 Long term (current) use of anticoagulants: Principal | ICD-10-CM

## 2020-09-18 DIAGNOSIS — Z992 Dependence on renal dialysis: Principal | ICD-10-CM

## 2020-09-18 DIAGNOSIS — I120 Hypertensive chronic kidney disease with stage 5 chronic kidney disease or end stage renal disease: Principal | ICD-10-CM

## 2020-09-18 DIAGNOSIS — F419 Anxiety disorder, unspecified: Principal | ICD-10-CM

## 2020-09-18 DIAGNOSIS — T829XXA Unspecified complication of cardiac and vascular prosthetic device, implant and graft, initial encounter: Principal | ICD-10-CM

## 2020-09-18 DIAGNOSIS — E876 Hypokalemia: Principal | ICD-10-CM

## 2020-09-18 DIAGNOSIS — R040 Epistaxis: Principal | ICD-10-CM

## 2020-09-18 DIAGNOSIS — K219 Gastro-esophageal reflux disease without esophagitis: Principal | ICD-10-CM

## 2020-09-18 MED ORDER — OXYMETAZOLINE 0.05 % NASAL SPRAY
Freq: Two times a day (BID) | NASAL | 0 refills | 75 days | Status: CP
Start: 2020-09-18 — End: 2020-09-21

## 2020-09-20 ENCOUNTER — Ambulatory Visit: Admit: 2020-09-20 | Discharge: 2020-09-21 | Payer: MEDICARE

## 2020-09-24 MED ORDER — ALLOPURINOL 100 MG TABLET
ORAL_TABLET | ORAL | 3 refills | 84 days | Status: CP
Start: 2020-09-24 — End: 2021-09-24
  Filled 2020-11-11: qty 36, 84d supply, fill #0

## 2020-09-27 MED ORDER — ISOSORBIDE MONONITRATE ER 120 MG TABLET,EXTENDED RELEASE 24 HR
ORAL_TABLET | Freq: Every day | ORAL | 0 refills | 30 days | Status: CP
Start: 2020-09-27 — End: ?
  Filled 2020-09-28: qty 30, 30d supply, fill #0

## 2020-09-27 MED ORDER — GABAPENTIN 100 MG CAPSULE
ORAL_CAPSULE | Freq: Three times a day (TID) | ORAL | 0 refills | 30 days | Status: CP
Start: 2020-09-27 — End: ?
  Filled 2020-09-28: qty 90, 30d supply, fill #0

## 2020-09-27 MED ORDER — PANTOPRAZOLE 20 MG TABLET,DELAYED RELEASE
ORAL_TABLET | Freq: Every day | ORAL | 0 refills | 30.00000 days | Status: CP
Start: 2020-09-27 — End: 2020-10-27
  Filled 2020-09-28: qty 30, 30d supply, fill #0

## 2020-09-27 MED ORDER — ASPIRIN 81 MG CHEWABLE TABLET
ORAL_TABLET | Freq: Every day | ORAL | 0 refills | 36.00000 days | Status: CP
Start: 2020-09-27 — End: 2020-11-02
  Filled 2020-09-28: qty 36, 36d supply, fill #0

## 2020-09-27 MED ORDER — ATORVASTATIN 80 MG TABLET
ORAL_TABLET | Freq: Every day | ORAL | 0 refills | 30 days | Status: CP
Start: 2020-09-27 — End: ?
  Filled 2020-09-28: qty 30, 30d supply, fill #0

## 2020-09-28 MED FILL — PANTOPRAZOLE 20 MG TABLET,DELAYED RELEASE: 30 days supply | Qty: 30 | Fill #0 | Status: AC

## 2020-09-28 MED FILL — GABAPENTIN 100 MG CAPSULE: 30 days supply | Qty: 90 | Fill #0 | Status: AC

## 2020-09-28 MED FILL — ELIQUIS 5 MG TABLET: ORAL | 30 days supply | Qty: 60 | Fill #0

## 2020-09-28 MED FILL — ATORVASTATIN 80 MG TABLET: 30 days supply | Qty: 30 | Fill #0 | Status: AC

## 2020-09-28 MED FILL — ASPIRIN 81 MG CHEWABLE TABLET: 36 days supply | Qty: 36 | Fill #0 | Status: AC

## 2020-09-28 MED FILL — ELIQUIS 5 MG TABLET: 30 days supply | Qty: 60 | Fill #0 | Status: AC

## 2020-09-28 MED FILL — ISOSORBIDE MONONITRATE ER 120 MG TABLET,EXTENDED RELEASE 24 HR: 30 days supply | Qty: 30 | Fill #0 | Status: AC

## 2020-10-04 MED FILL — AMLODIPINE 10 MG TABLET: 90 days supply | Qty: 90 | Fill #0 | Status: AC

## 2020-10-17 DIAGNOSIS — Z01818 Encounter for other preprocedural examination: Principal | ICD-10-CM

## 2020-10-17 DIAGNOSIS — N186 End stage renal disease: Principal | ICD-10-CM

## 2020-10-24 ENCOUNTER — Non-Acute Institutional Stay: Admit: 2020-10-24 | Discharge: 2020-10-24 | Payer: MEDICARE

## 2020-10-24 ENCOUNTER — Encounter: Admit: 2020-10-24 | Discharge: 2020-10-24 | Payer: MEDICARE

## 2020-10-24 ENCOUNTER — Encounter: Admit: 2020-10-24 | Discharge: 2020-10-24 | Payer: MEDICARE | Attending: Nephrology | Primary: Nephrology

## 2020-10-24 DIAGNOSIS — Z7901 Long term (current) use of anticoagulants: Principal | ICD-10-CM

## 2020-10-24 DIAGNOSIS — Z9049 Acquired absence of other specified parts of digestive tract: Principal | ICD-10-CM

## 2020-10-24 DIAGNOSIS — Z79899 Other long term (current) drug therapy: Principal | ICD-10-CM

## 2020-10-24 DIAGNOSIS — Z01818 Encounter for other preprocedural examination: Principal | ICD-10-CM

## 2020-10-24 DIAGNOSIS — E785 Hyperlipidemia, unspecified: Principal | ICD-10-CM

## 2020-10-24 DIAGNOSIS — Z992 Dependence on renal dialysis: Principal | ICD-10-CM

## 2020-10-24 DIAGNOSIS — N186 End stage renal disease: Principal | ICD-10-CM

## 2020-10-24 DIAGNOSIS — I120 Hypertensive chronic kidney disease with stage 5 chronic kidney disease or end stage renal disease: Principal | ICD-10-CM

## 2020-10-24 DIAGNOSIS — F17210 Nicotine dependence, cigarettes, uncomplicated: Principal | ICD-10-CM

## 2020-10-24 DIAGNOSIS — Z90710 Acquired absence of both cervix and uterus: Principal | ICD-10-CM

## 2020-10-24 DIAGNOSIS — M109 Gout, unspecified: Principal | ICD-10-CM

## 2020-10-24 DIAGNOSIS — Z90721 Acquired absence of ovaries, unilateral: Principal | ICD-10-CM

## 2020-10-24 DIAGNOSIS — Z7982 Long term (current) use of aspirin: Principal | ICD-10-CM

## 2020-10-25 DIAGNOSIS — Z01818 Encounter for other preprocedural examination: Principal | ICD-10-CM

## 2020-11-01 DIAGNOSIS — N186 End stage renal disease: Principal | ICD-10-CM

## 2020-11-01 DIAGNOSIS — Z01818 Encounter for other preprocedural examination: Principal | ICD-10-CM

## 2020-11-01 DIAGNOSIS — Z01810 Encounter for preprocedural cardiovascular examination: Principal | ICD-10-CM

## 2020-11-21 ENCOUNTER — Ambulatory Visit: Admit: 2020-11-21 | Discharge: 2020-11-22 | Payer: MEDICARE

## 2020-11-28 ENCOUNTER — Ambulatory Visit
Admit: 2020-11-28 | Discharge: 2020-11-29 | Payer: MEDICARE | Attending: Student in an Organized Health Care Education/Training Program | Primary: Student in an Organized Health Care Education/Training Program

## 2020-11-28 DIAGNOSIS — Z7682 Awaiting organ transplant status: Principal | ICD-10-CM

## 2020-12-16 ENCOUNTER — Ambulatory Visit: Admit: 2020-12-16 | Discharge: 2020-12-17 | Payer: MEDICARE

## 2020-12-16 DIAGNOSIS — Z01810 Encounter for preprocedural cardiovascular examination: Principal | ICD-10-CM

## 2020-12-16 DIAGNOSIS — E782 Mixed hyperlipidemia: Principal | ICD-10-CM

## 2020-12-16 DIAGNOSIS — I10 Essential (primary) hypertension: Principal | ICD-10-CM

## 2020-12-16 DIAGNOSIS — N764 Abscess of vulva: Principal | ICD-10-CM

## 2020-12-16 DIAGNOSIS — N184 Chronic kidney disease, stage 4 (severe): Principal | ICD-10-CM

## 2020-12-16 DIAGNOSIS — G4733 Obstructive sleep apnea (adult) (pediatric): Principal | ICD-10-CM

## 2020-12-16 MED ORDER — CLINDAMYCIN HCL 300 MG CAPSULE
ORAL_CAPSULE | Freq: Three times a day (TID) | ORAL | 0 refills | 10.00000 days | Status: CP
Start: 2020-12-16 — End: 2020-12-16

## 2020-12-16 MED ORDER — REPATHA SYRINGE 140 MG/ML SUBCUTANEOUS SYRINGE
SUBCUTANEOUS | 11 refills | 0 days | Status: CP
Start: 2020-12-16 — End: ?

## 2020-12-18 ENCOUNTER — Ambulatory Visit: Admit: 2020-12-18 | Discharge: 2020-12-19 | Payer: MEDICARE

## 2020-12-18 ENCOUNTER — Ambulatory Visit
Admit: 2020-12-18 | Discharge: 2020-12-18 | Disposition: A | Discharge status: Home / Self Care | Payer: MEDICARE | Attending: Student in an Organized Health Care Education/Training Program

## 2020-12-18 DIAGNOSIS — N764 Abscess of vulva: Principal | ICD-10-CM

## 2020-12-18 DIAGNOSIS — F32A Depression, unspecified depression type: Principal | ICD-10-CM

## 2020-12-18 MED ORDER — HYDROCODONE 5 MG-ACETAMINOPHEN 325 MG TABLET
ORAL_TABLET | Freq: Four times a day (QID) | ORAL | 0 refills | 3 days | Status: CP | PRN
Start: 2020-12-18 — End: 2020-12-23

## 2020-12-19 ENCOUNTER — Ambulatory Visit: Admit: 2020-12-19 | Discharge: 2020-12-20 | Payer: MEDICARE

## 2020-12-19 NOTE — Unmapped (Signed)
[facility] Specialty Medication Onboarding    Specialty Medication: Radio producer  Prior Authorization: Approved   Financial Assistance: No - copay  <$25  Final Copay/Day Supply: $3 / 70    Insurance Restrictions: None     Notes to Pharmacist:     The triage team has completed the benefits investigation and has determined that the patient is able to fill this medication at [facility]. Please contact the patient to complete the onboarding or follow up with the prescribing physician as needed.

## 2020-12-21 NOTE — Unmapped (Signed)
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 Will review with Dr. Doyne Keel fro PD cath placement.

## 2020-12-22 NOTE — Unmapped (Addendum)
[facility]   Patient Onboarding/Medication Counseling    Megan Robertson is a 50 y.o. female with hyperlipidemia who I am counseling today on continuation of therapy.  I am speaking to the patient.    Was a Nurse, learning disability used for this call? No    Verified patient's date of birth / HIPAA.    Specialty medication(s) to be sent: General Specialty: Repatha      Non-specialty medications/supplies to be sent: None at this time      Medications not needed at this time: n/a         Repatha (evolocumab)    The patient declined counseling on medication administration, missed dose instructions, goals of therapy, side effects and monitoring parameters, warnings and precautions, drug/food interactions and storage, handling precautions, and disposal because they have taken the medication previously. The information in the declined sections below are for informational purposes only and was not discussed with patient.       Medication & Administration     Dosage: Inject the contents of 1 pen (140mg ) under the skin every 2 weeks.    Administration: Administer under the skin of the abdomen, thigh or upper arm. Rotate sites with each injection.    ??? Injection instructions - Autoinjector:  o Remove 1 Repatha autoinjector from the refrigerator and let stand at room temperature for at least 30 minutes.  o Check the autoinjector for the following:  - Expiration date  - Absence of any cracks or damage  - The medicine is clear and colorless and does not contain any particles  - The orange cap is present and securely attached  o Choose your injection site and clean with an alcohol wipe. Allow to air dry completely.  o Pull the orange cap straight off and discard  o Pinch the skin (or stretch) with your thumb and fingers creating an area 2 inches wide  o Maintaining the pinch (or stretch) press the pen to your skin at a 90 degree angle. Firmly push the autoinjector down until the skin stops moving and the yellow safety guard is no longer visible.  o Do not touch the gray start button yet  o When you are ready to inject, press the gray start button. You will hear a click that signals the start of the injection  o Continue to press the pen to your skin and lift your thumb  o The injection may take up to 15 seconds. You will know the injection is complete when the medication window turns yellow. You may also hear a second click.  o Remove the pen from your skin and discard the pen in a sharps container.  o If there is blood at the injection site, press a cotton ball or gauze to the site. Do not rub the injection site.      Adherence/Missed dose instructions: Administer a missed dose within 7 days and resume your normal schedule.  If it has been more than 7 days and you inject every 2 weeks, skip the missed dose and resume your normal schedule..     Goals of Therapy     Lower cholesterol, prevention of cardiovascular events in patients with established cardiovascular disease    Side Effects & Monitoring Parameters   ??? Flu-like symptoms  ??? Signs of a common cold  ??? Back pain  ??? Injection site irritation  ??? Nose or throat irritation    The following side effects should be reported to the provider:  ???  Signs of an allergic reaction  ??? Signs of high blood sugar (confusion, drowsiness, increase thirst/hunger/urination, fast breathing, flushing)      Contraindications, Warnings, & Precautions     ??? Latex (the packaging of Repatha may contain natural rubber)    Drug/Food Interactions     ??? Medication list reviewed in Epic. The patient was instructed to inform the care team before taking any new medications or supplements. No drug interactions identified.     Storage, Handling Precautions, & Disposal   ??? Repatha should be stored in the refrigerator. If necessary, Repatha may be kept at room temperature for no more than 30 days.  ??? Place used devices in a sharps container for disposal.      Current Medications (including OTC/herbals), Comorbidities and Allergies     Current Outpatient Medications   Medication Sig Dispense Refill   ??? adalimumab (HUMIRA) 40 mg/0.8 mL subcutaneous pen kit Inject the contents of 2 pens (80mg ) under the skin once for initial dose, then inject 1 pen (40mg ) every 2 weeks. 6 each 3   ??? allopurinoL (ZYLOPRIM) 100 MG tablet Take 1 tablet (100 mg total) by mouth Every Tuesday, Thursday and Saturday. 36 tablet 3   ??? amLODIPine (NORVASC) 10 MG tablet Take 1 tablet (10 mg total) by mouth daily. 90 tablet 3   ??? aspirin 81 MG chewable tablet Chew 1 tablet (81 mg total) daily. 36 tablet 0   ??? atorvastatin (LIPITOR) 80 MG tablet Take 1 tablet (80 mg total) by mouth daily. 30 tablet 0   ??? beclomethasone dipropionate (QVAR REDIHALER) 80 mcg/actuation inhaler 1 puff every morning, 2 puffs every evening (Patient taking differently: continuous as needed. 1 puff every morning, 2 puffs every evening as needed) 10.6 g 0   ??? blood glucose control, normal (ACCU-CHEK SMARTVIEW CONTRL SOL) Soln 1 Bottle by Miscellaneous route once as needed. for up to 1 dose Use with each new box of test strips and as instructed 1 each 2   ??? blood sugar diagnostic (ACCU-CHEK SMARTVIEW TEST STRIP) Strp by Other route every morning before breakfast. For use with glucometer to check blood sugar daily 50 strip 5   ??? blood-glucose meter (ACCU-CHEK NANO) Misc 1 kit by Miscellaneous route every morning before breakfast. To check glucose level daily 1 each 0   ??? clindamycin (CLEOCIN) 300 MG capsule Take 1 capsule (300 mg total) by mouth Three (3) times a day for 10 days. 30 capsule 0   ??? clobetasoL (TEMOVATE) 0.05 % ointment Place ointment on outside of vulva nightly for 2 weeks and then twice a week thereafter. 45 g 0   ??? evolocumab (REPATHA SYRINGE) 140 mg/mL Syrg Inject the contents of 1 syringe (140 mg) under the skin every fourteen (14) days. 5 mL 11   ??? furosemide (LASIX) 40 MG tablet Take 1 tablet (40 mg total) by mouth once as needed. To be taken once a day if you develop leg swelling. 60 tablet 11   ??? gabapentin (NEURONTIN) 100 MG capsule Take 1 capsule (100 mg total) by mouth Three (3) times a day. 90 capsule 0   ??? HYDROcodone-acetaminophen (NORCO) 5-325 mg per tablet Take 1 tablet by mouth every six (6) hours as needed for pain for up to 5 days. 10 tablet 0   ??? isosorbide mononitrate (IMDUR) 120 MG 24 hr tablet Take 1 tablet (120 mg total) by mouth daily. 30 tablet 0   ??? lancets (ACCU-CHEK FASTCLIX LANCET DRUM) Misc 1 Units by Miscellaneous  route every morning before breakfast. 102 each 5   ??? lancing device with lancets (ACCU-CHEK FASTCLIX LANCING DEV) Kit 1 kit by Miscellaneous route every morning before breakfast. With glucometer/lancets to check blood sugar daily 1 each 1   ??? oxybutynin (DITROPAN) 5 MG tablet Take 1 tablet (5 mg total) by mouth Two (2) times a day. 30 tablet 2   ??? PARoxetine (PAXIL) 10 MG tablet Take 1 tablet (10 mg total) by mouth nightly. (Patient not taking: Reported on 12/16/2020) 30 tablet 0   ??? triamcinolone (KENALOG) 0.1 % ointment APPLY  TOPICALLY TWICE DAILY TO  AFFECTED  AREAS 454 g 0     No current facility-administered medications for this visit.       Allergies   Allergen Reactions   ??? Cephalexin Itching     unknown; tolerates cefepime   ??? Mango Itching   ??? Peach (Prunus Persica) Itching   ??? Wellbutrin [Bupropion Hcl] Other (See Comments)     Seizure   ??? Egg Itching   ??? Fish Containing Products Itching   ??? Lactase-Rennet Nausea And Vomiting   ??? Mushroom Itching   ??? Tomato (Solanum Lycopersicum) Itching       Patient Active Problem List   Diagnosis   ??? Stage 4 chronic kidney disease (CMS-HCC)   ??? Gout   ??? GERD (gastroesophageal reflux disease)   ??? Chronic diarrhea   ??? Hypertension   ??? Ductal hyperplasia of breast   ??? Affective disorder, psychotic (CMS-HCC)   ??? Bereavement   ??? PTSD (post-traumatic stress disorder)   ??? Nipple discharge   ??? Prediabetes   ??? History of head injury   ??? Mild persistent asthma without complication   ??? Other chest pain   ??? Dyspnea on exertion   ??? Dyslipidemia   ??? Stable angina (CMS-HCC)   ??? OSA (obstructive sleep apnea)   ??? Lichen sclerosus   ??? Vasomotor symptoms due to menopause   ??? Dyspareunia in female   ??? 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)   ??? Leukocytosis   ??? Anemia   ??? Thrombocytopenia (CMS-HCC)   ??? Hypokalemia   ??? Tachycardia   ??? Anemia in chronic kidney disease   ??? Anaphylactic shock, unspecified, initial encounter   ??? Coagulation defect, unspecified (CMS-HCC)   ??? Colitis   ??? End stage renal disease (CMS-HCC)   ??? Metabolic acidosis, NAG, bicarbonate losses   ??? Obesity   ??? S/P vaginal hysterectomy   ??? Secondary hyperparathyroidism of renal origin (CMS-HCC)       Reviewed and up to date in Epic.    Appropriateness of Therapy     Is medication and dose appropriate based on diagnosis? Yes    Prescription has been clinically reviewed: Yes    Baseline Quality of Life Assessment      How many days over the past month did your hyperlipidemia  keep you from your normal activities? For example, brushing your teeth or getting up in the morning. Patient declined to answer    Financial Information     Medication Assistance provided: Prior Authorization    Anticipated copay of $3 (84 days) reviewed with patient. Verified delivery address.    Delivery Information     Scheduled delivery date: 01/04/21    Expected start date: 01/09/21    Medication will be delivered via Same Day Courier to the prescription address in [facility].  This shipment will not require a signature.      Explained the services we  provide at [facility] and that each month we would call to set up refills.  Stressed importance of returning phone calls so that we could ensure they receive their medications in time each month.  Informed patient that we should be setting up refills 7-10 days prior to when they will run out of medication.  A pharmacist will reach out to perform a clinical assessment periodically.  Informed patient that a welcome packet, containing information about our pharmacy and other support services, a Notice of Privacy Practices, and a drug information handout will be sent.      Patient verbalized understanding of the above information as well as how to contact the pharmacy at [phone] option 4 with any questions/concerns.  The pharmacy is open Monday through Friday 8:30am-4:30pm.  A pharmacist is available 24/7 via pager to answer any clinical questions they may have.    Patient Specific Needs     - Does the patient have any physical, cognitive, or cultural barriers? No    - Patient prefers to have medications discussed with  Patient     - Is the patient or caregiver able to read and understand education materials at a high school level or above? Yes    - Patient's primary language is  English     - Is the patient high risk? No    - Does the patient require a Care Management Plan? No     - Does the patient require physician intervention or other additional services (i.e. nutrition, smoking cessation, social work)? No      Camillo Flaming  [facility] Specialty Pharmacist

## 2020-12-26 DIAGNOSIS — E785 Hyperlipidemia, unspecified: Principal | ICD-10-CM

## 2020-12-26 DIAGNOSIS — E782 Mixed hyperlipidemia: Principal | ICD-10-CM

## 2020-12-26 MED ORDER — REPATHA SYRINGE 140 MG/ML SUBCUTANEOUS SYRINGE
SUBCUTANEOUS | 11 refills | 0 days | Status: CN
Start: 2020-12-26 — End: ?

## 2020-12-26 MED ORDER — REPATHA SURECLICK 140 MG/ML SUBCUTANEOUS PEN INJECTOR
SUBCUTANEOUS | 3 refills | 0 days | Status: CP
Start: 2020-12-26 — End: 2021-03-26
  Filled 2021-01-04: qty 6, 84d supply, fill #0

## 2020-12-27 NOTE — Unmapped (Signed)
Received note from pharmacy that pt has been getting the Repatha autoinjector instead of the syringes. New Rx for Repatha  Auto injector pended for review by MD

## 2020-12-28 ENCOUNTER — Ambulatory Visit: Admit: 2020-12-28 | Discharge: 2021-01-10 | Payer: MEDICARE

## 2020-12-28 DIAGNOSIS — Z01810 Encounter for preprocedural cardiovascular examination: Principal | ICD-10-CM

## 2020-12-28 MED ADMIN — ondansetron (ZOFRAN-ODT) 4 MG disintegrating tablet: ORAL | @ 17:00:00 | Stop: 2020-12-28

## 2020-12-28 MED ADMIN — regadenoson (LEXISCAN) injection: .4 mg | INTRAVENOUS | @ 15:00:00 | Stop: 2020-12-28

## 2020-12-28 MED ADMIN — Tc-99m Sestamibi (Cardiolite): 11 | INTRAVENOUS | @ 15:00:00 | Stop: 2020-12-28

## 2020-12-28 MED ADMIN — Tc-99m Sestamibi (Cardiolite): 31 | INTRAVENOUS | @ 16:00:00 | Stop: 2020-12-28

## 2020-12-28 MED ADMIN — ondansetron (ZOFRAN-ODT) disintegrating tablet 4 mg: 4 mg | ORAL | @ 17:00:00

## 2021-01-09 ENCOUNTER — Ambulatory Visit
Admit: 2021-01-09 | Discharge: 2021-01-10 | Payer: MEDICARE | Attending: Obstetrics & Gynecology | Primary: Obstetrics & Gynecology

## 2021-01-09 DIAGNOSIS — N764 Abscess of vulva: Principal | ICD-10-CM

## 2021-01-09 NOTE — Unmapped (Signed)
1st attempt. LVM for pt to call and schedule appointments.

## 2021-01-09 NOTE — Unmapped (Signed)
Provider: Reginold Agent  Division: Ob/Gyn [facility]    ASSESSMENT AND PLAN     Abscess of vulva: Resolved.   -     Local heat to soften scarring  - Precautions reviewed     SUBJECTIVE     Chief Complaint:  Abscess     [name] is a 50 y.o. [id number] who presents upon request of Referred Self with complaints of right vulvar abscess. It has been present for approximately one month. It was treated with I&D then re-treated with Word Catheter which stayed in place for approximately 1 1/2 days. She was treated with clindamycin. She notes improvement, but residual mass is still present    Past Medical History:   Diagnosis Date   ??? Abnormal mammogram    ??? Anxiety    ??? Arthritis    ??? Asthma    ??? Back pain    ??? Depression    ??? ESRD (end stage renal disease) on dialysis (CMS-HCC)    ??? FSGS (focal segmental glomerulosclerosis) 1997    renal biopsy at [facility]   ??? GERD (gastroesophageal reflux disease)    ??? Gout    ??? H/O adenoidectomy     had adenoids removed   ??? Hypercholesteremia    ??? Hypertension    ??? Pancreatitis    ??? Seizure (CMS-HCC)     unknown etiology; none for several years     Past Surgical History:   Procedure Laterality Date   ??? APPENDECTOMY     ??? BREAST EXCISIONAL BIOPSY Right 11/28/2016   ??? CHG US GUIDE, VASCULAR ACCESS N/A 01/29/2020    Procedure: ULTRASOUND GUIDANCE FOR VASC ACCESS REQUIRING [place] EVAL OF POTENTIAL ACCESS SITES;  Surgeon: Leona Carry, MD;  Location: MAIN OR [facility];  Service: Transplant   ??? CHG US GUIDE, VASCULAR ACCESS N/A 06/08/2020    Procedure: ULTRASOUND GUIDANCE FOR VASC ACCESS REQUIRING [place] EVAL OF POTENTIAL ACCESS SITES;  Surgeon: Leona Carry, MD;  Location: MAIN OR [facility];  Service: Transplant   ??? CHOLECYSTECTOMY     ??? COMBINED HYSTEROSCOPY DIAGNOSTIC / D&C  07/07/2015    Planned for endometrial ablation, but patient had uterine perforation after D&C and unable to perform   ??? LEFT OOPHORECTOMY Left 04/12/2000    Removed with L fallopian tube for ectopic pregnancy   ??? PR CREAT AV FISTULA,AUTOGENOUS GRAFT Left 01/29/2020    Procedure: Create Av Fistula (Separt Proc); Verner Mould;  Surgeon: Leona Carry, MD;  Location: MAIN [facility];  Service: Transplant   ??? PR CREAT AV FISTULA,NON-AUTOGENOUS GRAFT Left 06/08/2020    Procedure: CREATE AV FISTULA (SEPARATE PROC); NONAUTOGENOUS GRAFT (EG, BIOLOGICAL COLLAGEN, THERMOPLASTIC GRAFT);  Surgeon: Leona Carry, MD;  Location: MAIN OR [facility];  Service: Transplant   ??? PR EXPLORATION N/FLWD SURG UPPER EXTREMITY ARTERY Left 08/02/2020    Procedure: Exploration Not Followed By Surgical Repair, Artery; Upper Extremity (Eg, Axillary, Brachial, Radial, Ulnar);  Surgeon: Earney Mallet, MD;  Location: MAIN OR [facility];  Service: Vascular   ??? PR INCIS/DRAIN ARM,DEEP ABSC/HEMATOMA Left 08/14/2020    Procedure: INCISION AND DRAINAGE, UPPER ARM OR ELBOW AREA; DEEP ABSCESS OR HEMATOMA;  Surgeon: Earney Mallet, MD;  Location: MA[facility];  Service: Vascular   ??? PR REMV ART CLOT AXILL-BRACH,ARM INCIS Left 08/02/2020    Procedure: Embolect/Thrombec; Axilry/Brachial Art-Arm Incs;  Surgeon: Earney Mallet, MD;  Location: MAIN OR [facility];  Service: Vascular   ??? PR UPPER GI ENDOSCOPY,BIOPSY N/A 08/29/2017  Procedure: UGI ENDOSCOPY; WITH BIOPSY, SINGLE OR MULTIPLE;  Surgeon: Neysa Hotter, MD;  Location: GI PROCEDURES [facility] [facility];  Service: Gastroenterology   ??? PR VEIN BYPASS GRAFT,SUBCL-BRACHIAL Left 08/05/2020    Procedure: Bypass Graft, With Vein; Subclavian-Brachial;  Surgeon: Earney Mallet, MD;  Location: MAIN OR [facility];  Service: Vascular   ??? SALPINGECTOMY Left 04/12/2000    Ectopic pregnancy   ??? SALPINGECTOMY Right 11/22/2015    at time of total vaginal hysterectomy   ??? TONSILECTOMY, ADENOIDECTOMY, BILATERAL MYRINGOTOMY AND TUBES     ??? TOTAL VAGINAL HYSTERECTOMY  11/22/2015    With R salpingectomy     OB History     Gravida   6    Para   2    Term   2    Preterm        AB   4    Living   2 SAB   3    IAB        Ectopic   1    Molar        Multiple        Live Births   2              Family History   Adopted: Yes   Problem Relation Age of Onset   ??? Heart attack Mother 40   ??? No Known Problems Father    ??? No Known Problems Sister    ??? No Known Problems Son    ??? No Known Problems Daughter    ??? No Known Problems Brother    ??? No Known Problems Maternal Aunt    ??? No Known Problems Maternal Uncle    ??? No Known Problems Paternal Aunt    ??? No Known Problems Paternal Uncle    ??? No Known Problems Maternal Grandmother    ??? No Known Problems Maternal Grandfather    ??? No Known Problems Paternal Grandmother    ??? No Known Problems Paternal Grandfather    ??? No Known Problems Other    ??? Anesthesia problems Neg Hx    ??? Broken bones Neg Hx    ??? Cancer Neg Hx    ??? Clotting disorder Neg Hx    ??? Collagen disease Neg Hx    ??? Diabetes Neg Hx    ??? Dislocations Neg Hx    ??? Fibromyalgia Neg Hx    ??? Gout Neg Hx    ??? Hemophilia Neg Hx    ??? Osteoporosis Neg Hx    ??? Rheumatologic disease Neg Hx    ??? Scoliosis Neg Hx    ??? Severe sprains Neg Hx    ??? Sickle cell anemia Neg Hx    ??? Spinal Compression Fracture Neg Hx    ??? Melanoma Neg Hx    ??? Basal cell carcinoma Neg Hx    ??? Squamous cell carcinoma Neg Hx      Social History     Socioeconomic History   ??? Marital status: Married     Spouse name: Not on file   ??? Number of children: Not on file   ??? Years of education: Not on file   ??? Highest education level: Not on file   Occupational History   ??? Not on file   Tobacco Use   ??? Smoking status: Former Smoker     Types: Cigarettes     Quit date: 06/30/2010     Years since quitting: 10.5   ??? Smokeless tobacco:  Never Used   Vaping Use   ??? Vaping Use: Never used   Substance and Sexual Activity   ??? Alcohol use: No   ??? Drug use: No   ??? Sexual activity: Yes     Partners: Male     Birth control/protection: None   Other Topics Concern   ??? Do you use sunscreen? No   ??? Tanning bed use? No   ??? Are you easily burned? No   ??? Excessive sun exposure? No   ??? Blistering sunburns? No   Social History Narrative    on disability since 1994, but does not qualify anymore since she got married and her husband has income.     Lives in [place] with husband, Lisbeth Ply and 2 children     Social Determinants of Health     Financial Resource Strain: Not on file   Food Insecurity: Not on file   Transportation Needs: Not on file   Physical Activity: Not on file   Stress: Not on file   Social Connections: Not on file       Medications  Current Outpatient Medications   Medication Sig Dispense Refill   ??? amLODIPine (NORVASC) 10 MG tablet Take 1 tablet (10 mg total) by mouth daily. 90 tablet 3   ??? atorvastatin (LIPITOR) 80 MG tablet Take 1 tablet (80 mg total) by mouth daily. 30 tablet 0   ??? adalimumab (HUMIRA) 40 mg/0.8 mL subcutaneous pen kit Inject the contents of 2 pens (80mg ) under the skin once for initial dose, then inject 1 pen (40mg ) every 2 weeks. 6 each 3   ??? allopurinoL (ZYLOPRIM) 100 MG tablet Take 1 tablet (100 mg total) by mouth Every Tuesday, Thursday and Saturday. 36 tablet 3   ??? aspirin 81 MG chewable tablet Chew 1 tablet (81 mg total) daily. 36 tablet 0   ??? beclomethasone dipropionate (QVAR REDIHALER) 80 mcg/actuation inhaler 1 puff every morning, 2 puffs every evening (Patient taking differently: continuous as needed. 1 puff every morning, 2 puffs every evening as needed) 10.6 g 0   ??? blood glucose control, normal (ACCU-CHEK SMARTVIEW CONTRL SOL) Soln 1 Bottle by Miscellaneous route once as needed. for up to 1 dose Use with each new box of test strips and as instructed 1 each 2   ??? blood sugar diagnostic (ACCU-CHEK SMARTVIEW TEST STRIP) Strp by Other route every morning before breakfast. For use with glucometer to check blood sugar daily 50 strip 5   ??? blood-glucose meter (ACCU-CHEK NANO) Misc 1 kit by Miscellaneous route every morning before breakfast. To check glucose level daily 1 each 0   ??? clobetasoL (TEMOVATE) 0.05 % ointment Place ointment on outside of vulva nightly for 2 weeks and then twice a week thereafter. 45 g 0   ??? evolocumab (REPATHA SURECLICK) 140 mg/mL PnIj Inject the contents of 1 pen (140 mg) under the skin every fourteen (14) days. 6 mL 3   ??? furosemide (LASIX) 40 MG tablet Take 1 tablet (40 mg total) by mouth once as needed. To be taken once a day if you develop leg swelling. 60 tablet 11   ??? gabapentin (NEURONTIN) 100 MG capsule Take 1 capsule (100 mg total) by mouth Three (3) times a day. 90 capsule 0   ??? isosorbide mononitrate (IMDUR) 120 MG 24 hr tablet Take 1 tablet (120 mg total) by mouth daily. 30 tablet 0   ??? lancets (ACCU-CHEK FASTCLIX LANCET DRUM) Misc 1 Units by Miscellaneous route every morning before breakfast.  102 each 5   ??? lancing device with lancets (ACCU-CHEK FASTCLIX LANCING DEV) Kit 1 kit by Miscellaneous route every morning before breakfast. With glucometer/lancets to check blood sugar daily 1 each 1   ??? oxybutynin (DITROPAN) 5 MG tablet Take 1 tablet (5 mg total) by mouth Two (2) times a day. 30 tablet 2   ??? PARoxetine (PAXIL) 10 MG tablet Take 1 tablet (10 mg total) by mouth nightly. (Patient not taking: Reported on 12/16/2020) 30 tablet 0   ??? triamcinolone (KENALOG) 0.1 % ointment APPLY  TOPICALLY TWICE DAILY TO  AFFECTED  AREAS 454 g 0     No current facility-administered medications for this visit.       Allergies  Allergies   Allergen Reactions   ??? Cephalexin Itching     unknown; tolerates cefepime   ??? Mango Itching   ??? Peach (Prunus Persica) Itching   ??? Wellbutrin [Bupropion Hcl] Other (See Comments)     Seizure   ??? Egg Itching   ??? Fish Containing Products Itching   ??? Lactase-Rennet Nausea And Vomiting   ??? Lexiscan [Regadenoson] Nausea And Vomiting     Patient had nausea after the Lexi then vomitted around1145hrs   ??? Mushroom Itching   ??? Tomato (Solanum Lycopersicum) Itching       Review of Systems  Pertinent items are noted in HPI.      OBJECTIVE     General: WDWNF in NAD  BP 118/88  - Pulse 94  - Wt 70.9 kg (156 lb 6.4 oz)  - LMP  (LMP Unknown)  - BMI 28.61 kg/m??     Abdomen:  Soft, non-tender, No masses, no organomegaly    Pelvic Exam:   EG/BUS:  Anteriror right mons with 2 cm of scarring without fluctuence, erythema or tenderness. No adenopathy

## 2021-01-10 NOTE — Unmapped (Signed)
Spoke with pt and confirmed phone visit on 02/01/21 and testing/surgeon on 02/27/21. Sent letters through [employer].

## 2021-01-11 ENCOUNTER — Ambulatory Visit: Admit: 2021-01-11 | Discharge: 2021-01-12 | Payer: MEDICARE

## 2021-01-11 NOTE — Unmapped (Signed)
Called to confirm appt. for 04/06.  Left message

## 2021-01-20 NOTE — Unmapped (Signed)
Patient states she is still on Eliquis. In basket message to Dr. Coralee Rud and Ms. Worthy Keeler asking if we can bridge to lovenox to proceed with the PD cath placement

## 2021-01-23 DIAGNOSIS — Z0000 Encounter for general adult medical examination without abnormal findings: Principal | ICD-10-CM

## 2021-01-23 DIAGNOSIS — Z01818 Encounter for other preprocedural examination: Principal | ICD-10-CM

## 2021-01-23 DIAGNOSIS — Z124 Encounter for screening for malignant neoplasm of cervix: Principal | ICD-10-CM

## 2021-01-23 DIAGNOSIS — N186 End stage renal disease: Principal | ICD-10-CM

## 2021-01-24 NOTE — Unmapped (Signed)
Spoke with patient. Reviewed upcoming appointment with Dr. Coralee Rud tomorrow fro duplex UE and appointment- we need recommendations for anticoagulation before proceeding with PD catheter.

## 2021-01-24 NOTE — Unmapped (Signed)
Still waiting or recommendations from Dr. Coralee Rud or Ms. Belgum (both vascular surgery) for anticoagulation. Will move PD cath from tomorrow to 4/13. PAtient notified

## 2021-01-25 ENCOUNTER — Ambulatory Visit: Admit: 2021-01-25 | Discharge: 2021-01-26 | Payer: MEDICARE | Attending: Vascular Surgery | Primary: Vascular Surgery

## 2021-01-25 ENCOUNTER — Ambulatory Visit: Admit: 2021-01-25 | Discharge: 2021-01-26 | Payer: MEDICARE

## 2021-01-25 DIAGNOSIS — Z992 Dependence on renal dialysis: Principal | ICD-10-CM

## 2021-01-25 DIAGNOSIS — N186 End stage renal disease: Principal | ICD-10-CM

## 2021-01-25 DIAGNOSIS — I998 Other disorder of circulatory system: Principal | ICD-10-CM

## 2021-01-25 NOTE — Unmapped (Signed)
Visit date:  01/25/21  ??  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  Duplex imaging demonstrates patent bypass however it appears she may have an obstruction distal to the bypass graft given dampened flow in the radial and ulnar arteries.  Given this finding we recommend she undergo LUE angiogram with possible intervention via femoral access.  She will need to hold her eliquis 2 days prior.  She was informed she may hold her eliquis prior to her PD catheter insertion, no bridge needed per Dr. Coralee Rud.       HPI: Megan Robertson is a 50 y.o. female with a history of asthma, FSGS, GERD, gout, HLD, HTN and ESRD on dialysis.  She underwent left??brachio-cephalic??vein AVF (01/29/20) which thrombosed prior to maturation and underwent left brachiobasilic AVG on 06/08/20 with Dr. Norma Fredrickson which also failed and was never used.  Since the brachiobasilic AVG was created in august, she reports left arm pain, numbness in the hand, and some weakness of the left upper extremity. These symptoms initially had some improvement with grip strengh exercises.  She presented to the hospital on 08/02/20 after having severe pain, cold fingertips, paresthesias and decreased LUE pulses with arterial duplex demonstrating occlusion of mid/distal brachial artery and no flow in distal radial artery or ulnar artery.  On 10/12 she underwent surgical exposure of left brachial artery and thromboembolectomy of left brachial artery and ulnar artery.  On 10/15 she underwent left subclavian to brachial artery bypass using reversed GSV with right lower extremity GSV harvest.  She developed leukocytosis with CT imaging demonstrating a 3 cm collection with air in the upper arm near the vein graft, and on 10/24 she underwent LUE wound exploration and washout, excisional debridement of subcutaneous tissue and wound vac placement.  She finished a 14 day course of antibiotics on 11/6.  She was last seen in clinic on 09/14/20 and presents today for follow-up.  She dialyzes TThSa.  She is scheduled for PD catheter insertion next week.       Megan Robertson presents to the clinic today with family.  She continues to experience LUR paresthesias.  She also notes tenderness in her left upper arm at the bypass site.  She continues to take eliquis.       Allergies   Allergen Reactions   ??? Cephalexin Itching     unknown; tolerates cefepime   ??? Mango Itching   ??? Peach (Prunus Persica) Itching   ??? Wellbutrin [Bupropion Hcl] Other (See Comments)     Seizure   ??? Egg Itching   ??? Fish Containing Products Itching   ??? Lactase-Rennet Nausea And Vomiting   ??? Lexiscan [Regadenoson] Nausea And Vomiting     Patient had nausea after the Lexi then vomitted [id number]   ??? Mushroom Itching   ??? Tomato (Solanum Lycopersicum) Itching       Current Outpatient Medications   Medication Sig Dispense Refill   ??? aspirin 81 MG chewable tablet Chew 1 tablet (  81 mg total) daily. 36 tablet 0   ??? adalimumab (HUMIRA) 40 mg/0.8 mL subcutaneous pen kit Inject the contents of 2 pens (80mg ) under the skin once for initial dose, then inject 1 pen (40mg ) every 2 weeks. 6 each 3   ??? allopurinoL (ZYLOPRIM) 100 MG tablet Take 1 tablet (100 mg total) by mouth Every Tuesday, Thursday and Saturday. 36 tablet 3   ??? amLODIPine (NORVASC) 10 MG tablet Take 1 tablet (10 mg total) by mouth daily. 90 tablet 3   ??? atorvastatin (LIPITOR) 80 MG tablet Take 1 tablet (80 mg total) by mouth daily. 30 tablet 0   ??? beclomethasone dipropionate (QVAR REDIHALER) 80 mcg/actuation inhaler 1 puff every morning, 2 puffs every evening (Patient taking differently: continuous as needed. 1 puff every morning, 2 puffs every evening as needed) 10.6 g 0   ??? blood glucose control, normal (ACCU-CHEK SMARTVIEW CONTRL SOL) Soln 1 Bottle by Miscellaneous route once as needed. for up to 1 dose Use with each new box of test strips and as instructed 1 each 2   ??? blood sugar diagnostic (ACCU-CHEK SMARTVIEW TEST STRIP) Strp by Other route every morning before breakfast. For use with glucometer to check blood sugar daily 50 strip 5   ??? blood-glucose meter (ACCU-CHEK NANO) Misc 1 kit by Miscellaneous route every morning before breakfast. To check glucose level daily 1 each 0   ??? clobetasoL (TEMOVATE) 0.05 % ointment Place ointment on outside of vulva nightly for 2 weeks and then twice a week thereafter. 45 g 0   ??? evolocumab (REPATHA SURECLICK) 140 mg/mL PnIj Inject the contents of 1 pen (140 mg) under the skin every fourteen (14) days. 6 mL 3   ??? furosemide (LASIX) 40 MG tablet Take 1 tablet (40 mg total) by mouth once as needed. To be taken once a day if you develop leg swelling. 60 tablet 11   ??? gabapentin (NEURONTIN) 100 MG capsule Take 1 capsule (100 mg total) by mouth Three (3) times a day. 90 capsule 0   ??? isosorbide mononitrate (IMDUR) 120 MG 24 hr tablet Take 1 tablet (120 mg total) by mouth daily. 30 tablet 0   ??? lancets (ACCU-CHEK FASTCLIX LANCET DRUM) Misc 1 Units by Miscellaneous route every morning before breakfast. 102 each 5   ??? lancing device with lancets (ACCU-CHEK FASTCLIX LANCING DEV) Kit 1 kit by Miscellaneous route every morning before breakfast. With glucometer/lancets to check blood sugar daily 1 each 1   ??? oxybutynin (DITROPAN) 5 MG tablet Take 1 tablet (5 mg total) by mouth Two (2) times a day. 30 tablet 2   ??? PARoxetine (PAXIL) 10 MG tablet Take 1 tablet (10 mg total) by mouth nightly. (Patient not taking: Reported on 12/16/2020) 30 tablet 0 ??? triamcinolone (KENALOG) 0.1 % ointment APPLY  TOPICALLY TWICE DAILY TO  AFFECTED  AREAS 454 g 0     No current facility-administered medications for this visit.       Past Medical History:   Diagnosis Date   ??? Abnormal mammogram    ??? Anxiety    ??? Arthritis    ??? Asthma    ??? Back pain    ??? Depression    ??? ESRD (end stage renal disease) on dialysis (CMS-HCC)    ??? FSGS (focal segmental glomerulosclerosis) 1997    renal biopsy at [facility]   ??? GERD (gastroesophageal reflux disease)    ??? Gout    ??? H/O adenoidectomy     had adenoids  removed   ??? Hypercholesteremia    ??? Hypertension    ??? Pancreatitis    ??? Seizure (CMS-HCC)     unknown etiology; none for several years       Past Surgical History:   Procedure Laterality Date   ??? APPENDECTOMY     ??? BREAST EXCISIONAL BIOPSY Right 11/28/2016   ??? CHG US GUIDE, VASCULAR ACCESS N/A 01/29/2020    Procedure: ULTRASOUND GUIDANCE FOR VASC ACCESS REQUIRING [place] EVAL OF POTENTIAL ACCESS SITES;  Surgeon: Leona Carry, MD;  Location: MAIN OR [facility];  Service: Transplant   ??? CHG US GUIDE, VASCULAR ACCESS N/A 06/08/2020    Procedure: ULTRASOUND GUIDANCE FOR VASC ACCESS REQUIRING [place] EVAL OF POTENTIAL ACCESS SITES;  Surgeon: Leona Carry, MD;  Location: MAIN OR [facility];  Service: Transplant   ??? CHOLECYSTECTOMY     ??? COMBINED HYSTEROSCOPY DIAGNOSTIC / D&C  07/07/2015    Planned for endometrial ablation, but patient had uterine perforation after D&C and unable to perform   ??? LEFT OOPHORECTOMY Left 04/12/2000    Removed with L fallopian tube for ectopic pregnancy   ??? PR CREAT AV FISTULA,AUTOGENOUS GRAFT Left 01/29/2020    Procedure: Create Av Fistula (Separt Proc); Verner Mould;  Surgeon: Leona Carry, MD;  Location: MAIN OR [facility];  Service: Transplant   ??? PR CREAT AV FISTULA,NON-AUTOGENOUS GRAFT Left 06/08/2020    Procedure: CREATE AV FISTULA (SEPARATE PROC); NONAUTOGENOUS GRAFT (EG, BIOLOGICAL COLLAGEN, THERMOPLASTIC GRAFT);  Surgeon: Leona Carry, MD;  Location: MAIN OR [facility];  Service: Transplant   ??? PR EXPLORATION N/FLWD SURG UPPER EXTREMITY ARTERY Left 08/02/2020    Procedure: Exploration Not Followed By Surgical Repair, Artery; Upper Extremity (Eg, Axillary, Brachial, Radial, Ulnar);  Surgeon: Earney Mallet, MD;  Location: MAIN OR [facility];  Service: Vascular   ??? PR INCIS/DRAIN ARM,DEEP ABSC/HEMATOMA Left 08/14/2020    Procedure: INCISION AND DRAINAGE, UPPER ARM OR ELBOW AREA; DEEP ABSCESS OR HEMATOMA;  Surgeon: Earney Mallet, MD;  Location: MAIN OR [facility];  Service: Vascular   ??? PR REMV ART CLOT AXILL-BRACH,ARM INCIS Left 08/02/2020    Procedure: Embolect/Thrombec; Axilry/Brachial Art-Arm Incs;  Surgeon: Earney Mallet, MD;  Location: MAIN OR [facility];  Service: Vascular   ??? PR UPPER GI ENDOSCOPY,BIOPSY N/A 08/29/2017    Procedure: UGI ENDOSCOPY; WITH BIOPSY, SINGLE OR MULTIPLE;  Surgeon: Neysa Hotter, MD;  Location: GI PROCEDURES [facility];  Service: Gastroenterology   ??? PR VEIN BYPASS GRAFT,SUBCL-BRACHIAL Left 08/05/2020    Procedure: Bypass Graft, With Vein; Subclavian-Brachial;  Surgeon: Earney Mallet, MD;  Location: MAIN OR [facility];  Service: Vascular   ??? SALPINGECTOMY Left 04/12/2000    Ectopic pregnancy   ??? SALPINGECTOMY Right 11/22/2015    at time of total vaginal hysterectomy   ??? TONSILECTOMY, ADENOIDECTOMY, BILATERAL MYRINGOTOMY AND TUBES     ??? TOTAL VAGINAL HYSTERECTOMY  11/22/2015    With R salpingectomy        Social History     Socioeconomic History   ??? Marital status: Married     Spouse name: Not on file   ??? Number of children: Not on file   ??? Years of education: Not on file   ??? Highest education level: Not on file   Occupational History   ??? Not on file   Tobacco Use   ??? Smoking status: Former Smoker     Types: Cigarettes     Quit date: 06/30/2010     Years since quitting: 10.5   ???  Smokeless tobacco: Never Used   Vaping Use   ??? Vaping Use: Never used   Substance and Sexual Activity   ??? Alcohol use: No   ??? Drug use: No   ??? Sexual activity: Yes     Partners: Male     Birth control/protection: None   Other Topics Concern   ??? Do you use sunscreen? No   ??? Tanning bed use? No   ??? Are you easily burned? No   ??? Excessive sun exposure? No   ??? Blistering sunburns? No   Social History Narrative    on disability since 1994, but does not qualify anymore since she got married and her husband has income.     Lives in [place] with husband, Lisbeth Ply and 2 children     Social Determinants of Health     Financial Resource Strain: Not on file   Food Insecurity: Not on file   Transportation Needs: Not on file   Physical Activity: Not on file   Stress: Not on file   Social Connections: Not on file       Family History   Adopted: Yes   Problem Relation Age of Onset   ??? Heart attack Mother 3   ??? No Known Problems Father    ??? No Known Problems Sister    ??? No Known Problems Son    ??? No Known Problems Daughter    ??? No Known Problems Brother    ??? No Known Problems Maternal Aunt    ??? No Known Problems Maternal Uncle    ??? No Known Problems Paternal Aunt    ??? No Known Problems Paternal Uncle    ??? No Known Problems Maternal Grandmother    ??? No Known Problems Maternal Grandfather    ??? No Known Problems Paternal Grandmother    ??? No Known Problems Paternal Grandfather    ??? No Known Problems Other    ??? Anesthesia problems Neg Hx    ??? Broken bones Neg Hx    ??? Cancer Neg Hx    ??? Clotting disorder Neg Hx    ??? Collagen disease Neg Hx    ??? Diabetes Neg Hx    ??? Dislocations Neg Hx    ??? Fibromyalgia Neg Hx    ??? Gout Neg Hx    ??? Hemophilia Neg Hx    ??? Osteoporosis Neg Hx    ??? Rheumatologic disease Neg Hx    ??? Scoliosis Neg Hx    ??? Severe sprains Neg Hx    ??? Sickle cell anemia Neg Hx    ??? Spinal Compression Fracture Neg Hx    ??? Melanoma Neg Hx    ??? Basal cell carcinoma Neg Hx    ??? Squamous cell carcinoma Neg Hx        PE:    Vitals:    01/25/21 1019   BP: 121/80   Pulse: 108   Temp: 36.2 ??C (97.2 ??F)       General: WD, WN well-appearing female in NAD.    Cardiovascular:  Left arm warm, well perfused; positive left radial and palmar arch signals.  Palpable LUE bypass.      Lungs:  Respirations even, nonlabored.  No wheezes, crackles, rhonchi.    Neurological: Alert and oriented x 4. Steady gait. Sensation intact.     Psych/Mental Health:  Appropriate affect.      Imaging:  01/25/21 LUE arterial duplex:  These images/results were reviewed by Dr. Coralee Rud  Final Interpretation     Left: Patent subclavian brachial RSVG - Dampened flow in the radial        and ulnar arteries - See comments above concerning area of        obstruction.

## 2021-01-25 NOTE — Unmapped (Signed)
Olene Floss, FNP  Jearld Lesch, RN  Megan Robertson,     We are ok with holding the eliquis with no bridge. ??Thank you for checking.       My Chart message to [name] to make her last dose of Eliquis Saturday, April 9. She will hold Sunday, Monday, Tuesday and morning of OR- Wednesday

## 2021-01-30 NOTE — Unmapped (Signed)
Patient aware to hold eliquis starting on 4/19 in preparation for angiogram on 4/21.  She should continue to take daily aspirin.  Patient verbalized understanding.

## 2021-01-31 MED ORDER — MIRCERA INJ
0 days
Start: 2021-01-31 — End: 2022-01-30

## 2021-02-01 ENCOUNTER — Encounter
Admit: 2021-02-01 | Discharge: 2021-02-01 | Payer: MEDICARE | Attending: Student in an Organized Health Care Education/Training Program | Primary: Student in an Organized Health Care Education/Training Program

## 2021-02-01 ENCOUNTER — Ambulatory Visit: Admit: 2021-02-01 | Discharge: 2021-02-01 | Payer: MEDICARE

## 2021-02-01 MED ORDER — OXYCODONE 5 MG TABLET
ORAL_TABLET | ORAL | 0 refills | 2.00000 days | Status: CP | PRN
Start: 2021-02-01 — End: 2021-02-06
  Filled 2021-02-01: qty 10, 2d supply, fill #0

## 2021-02-01 MED ADMIN — heparin 1,000 units/500 mL (2 units/mL) in 0.9% NaCl infusion: @ 16:00:00 | Stop: 2021-02-01

## 2021-02-01 MED ADMIN — fentaNYL (PF) (SUBLIMAZE) injection: INTRAVENOUS | @ 17:00:00 | Stop: 2021-02-01

## 2021-02-01 MED ADMIN — neostigmine (BLOXIVERZ) injection: INTRAVENOUS | @ 17:00:00 | Stop: 2021-02-01

## 2021-02-01 MED ADMIN — esmoloL (BREVIBLOC) injection: INTRAVENOUS | @ 15:00:00 | Stop: 2021-02-01

## 2021-02-01 MED ADMIN — propofoL (DIPRIVAN) injection: INTRAVENOUS | @ 17:00:00 | Stop: 2021-02-01

## 2021-02-01 MED ADMIN — sodium chloride irrigation (NS) 0.9 % irrigation solution: @ 16:00:00 | Stop: 2021-02-01

## 2021-02-01 MED ADMIN — dexamethasone (DECADRON) 4 mg/mL injection: INTRAVENOUS | @ 15:00:00 | Stop: 2021-02-01

## 2021-02-01 MED ADMIN — ondansetron (ZOFRAN) injection: INTRAVENOUS | @ 17:00:00 | Stop: 2021-02-01

## 2021-02-01 MED ADMIN — clindamycin (CLEOCIN) injection: INTRAVENOUS | @ 15:00:00 | Stop: 2021-02-01

## 2021-02-01 MED ADMIN — glycopyrrolate (ROBINUL) injection: INTRAVENOUS | @ 17:00:00 | Stop: 2021-02-01

## 2021-02-01 MED ADMIN — lidocaine (XYLOCAINE) 20 mg/mL (2 %) injection: INTRAVENOUS | @ 15:00:00 | Stop: 2021-02-01

## 2021-02-01 MED ADMIN — propofoL (DIPRIVAN) injection: INTRAVENOUS | @ 15:00:00 | Stop: 2021-02-01

## 2021-02-01 MED ADMIN — acetaminophen (TYLENOL) tablet 1,000 mg: 1000 mg | ORAL | @ 18:00:00 | Stop: 2021-02-01

## 2021-02-01 MED ADMIN — fentaNYL (PF) (SUBLIMAZE) injection: INTRAVENOUS | @ 15:00:00 | Stop: 2021-02-01

## 2021-02-01 MED ADMIN — CISATRacurium (NIMBEX) injection: INTRAVENOUS | @ 15:00:00 | Stop: 2021-02-01

## 2021-02-01 NOTE — Unmapped (Signed)
See AVS

## 2021-02-01 NOTE — Unmapped (Signed)
The patient came for PD cath insertion.  Eliquis on hold, no concerns.  Explained again about the procedure and possible complications.  The patient understands and agrees.

## 2021-02-03 NOTE — Unmapped (Signed)
Date of Surgery: 02/01/2021.     Pre-op Diagnosis: Chronic kidney disease    Post-op Diagnosis: Same     Procedure(s):  LAPAROSCOPY, SURGICAL; WITH INSERTION OF INTRAPERITONEAL CANNULA OR CATHETER, PERMANENT: 49324 (CPT??)     Performing Service: Transplant    Surgeons:  Loney Hering, MD - Primary  Tommy Rainwater, MD - Resident - Assisting     Findings: Intra-abdominal adhesions of omentum to abdominal wall, otherwise normal abdominal anatomy     Anesthesia: General     Estimated Blood Loss: 5 mL     Complications: None     Specimens: None collected     Procedure:  General anesthesia was performed.   Patient was prepped and draped.   PD cath entrance site was marked on the left abdomen using the measure tool.  Opti-view 5 mm trocar placed subcostal in LUQ, abdominal cavity inflated and observed.  5 mm trocar was placed on the mark sign angled towards the pelvis.  5 mm trocar was placed on the RLQ.  Adhesiolysis of omentum to abdominal wall was performed using Ligasure device.  PD catheter was inserted through the left port while internal cuff just above the posterior rectus sheath and placed in the pelvis.  It was tunneled superficially and extracted from the skin on the left lateral abdomen making sure it's not twisted.  The catheter function was confirmed by 1 L of fluid in and out.  200 ml of Hep Saline were injected through the catheter.  Trocars were taken out with no bleeding and abdominal cavity deflated.  Skin incisions were closed with 4-0 Monocryl intradermal suture.  Dressings applied.      I was present during the entire procedure.     Jacqulyn Ducking, MD

## 2021-02-07 NOTE — Unmapped (Signed)
Pre-call completed for VIR procedure.   Pt has prep instructions and understands them as well as pre-procedure diet restrictions.  Medication guidelines day of procedure reviewed.    NPO status, need for driver, arrival time/location reviewed.  All questions addressed and pt verbalizes understanding.    Covid screening questions reviewed.  Visitor information documented in pre-op checklist.

## 2021-02-09 ENCOUNTER — Ambulatory Visit: Admit: 2021-02-09 | Discharge: 2021-02-10 | Payer: MEDICARE

## 2021-02-09 MED ADMIN — midazolam (VERSED) injection: INTRAVENOUS | @ 13:00:00 | Stop: 2021-02-09

## 2021-02-09 MED ADMIN — protamine injection: INTRAVENOUS | @ 14:00:00 | Stop: 2021-02-09

## 2021-02-09 MED ADMIN — sodium chloride (NS) 0.9 % infusion: INTRAVENOUS | @ 13:00:00 | Stop: 2021-02-09

## 2021-02-09 MED ADMIN — heparin (porcine) 1000 unit/mL injection: INTRAVENOUS | @ 13:00:00 | Stop: 2021-02-09

## 2021-02-09 MED ADMIN — fentaNYL (PF) (SUBLIMAZE) injection: INTRAVENOUS | @ 14:00:00 | Stop: 2021-02-09

## 2021-02-09 MED ADMIN — midazolam (VERSED) injection: INTRAVENOUS | @ 14:00:00 | Stop: 2021-02-09

## 2021-02-09 MED ADMIN — fentaNYL (PF) (SUBLIMAZE) injection: INTRAVENOUS | @ 13:00:00 | Stop: 2021-02-09

## 2021-02-09 MED ADMIN — lidocaine (XYLOCAINE) 10 mg/mL (1 %) injection: INTRADERMAL | @ 13:00:00 | Stop: 2021-02-09

## 2021-02-09 MED ADMIN — iodixanoL (VISIPAQUE) 270 mg iodine/mL injection 150 mL: 150 mL | INTRAVENOUS | @ 14:00:00 | Stop: 2021-02-09

## 2021-02-09 MED ADMIN — heparin (porcine) 1000 unit/mL injection: INTRAVENOUS | @ 14:00:00 | Stop: 2021-02-09

## 2021-02-09 NOTE — Unmapped (Signed)
Pt brought back from procedure by Selena Batten, RN. Pt tolerated well. Dressing on L femoral site is CDI and skin surrounding is CDI and soft.

## 2021-02-10 ENCOUNTER — Institutional Professional Consult (permissible substitution): Admit: 2021-02-10 | Discharge: 2021-02-11 | Payer: MEDICARE | Attending: Psychologist | Primary: Psychologist

## 2021-02-10 NOTE — Unmapped (Signed)
VIR post call attempted, no answer.

## 2021-02-10 NOTE — Unmapped (Incomplete)
CONFIDENTIAL PSYCHOLOGICAL EVALUATION FOR TRANSPLANT    Patient Name: Megan Robertson  Medical Record Number: [id number]  Date of Service: February 10, 2021  Clinical Psychologist: Deedra Ehrich, PsyD  Evaluation Duration and Procedures: 70 minute clinical interview; record review; case consultation;         The patient reports they are currently: at home. I spent 70 minutes on the phone visit with the patient on the date of service. I spent an additional 60 minutes on pre- and post-visit activities on the date of service.     The patient was not located and I was not located within 250 yards of a hospital based location during the phone visit. The patient was physically located in [place] or a state in which I am permitted to provide care. The patient and/or parent/guardian understood that s/he may incur co-pays and cost sharing, and agreed to the telemedicine visit. The visit was reasonable and appropriate under the circumstances given the patient's presentation at the time.    The patient and/or parent/guardian has been advised of the potential risks and limitations of this mode of treatment (including, but not limited to, the absence of in-person examination) and has agreed to be treated using telemedicine. The patient's/patient's family's questions regarding telemedicine have been answered.    If the visit was completed in an ambulatory setting, the patient and/or parent/guardian has also been advised to contact their provider???s office for worsening conditions, and seek emergency medical treatment and/or call 911 if the patient deems either necessary.    This evaluation note may contain sensitive and confidential information regarding the patient???s psychosocial adjustment to living with a chronic medical condition. DO NOT share this information outside [facility] without written consent from the patient explicitly stating that mental health records may be released.     The limits of confidentiality and the purpose of the evaluation were reviewed. The patient was provided with a verbal description of the nature and purpose of the psychological evaluation. I also reviewed the referral source, specific referral question for this evaluation, foreseeable risks/discomforts, benefits, limits of confidentiality, and mandatory reporting requirements of this provider. The patient was given the opportunity to ask questions and receive answers about the present evaluation. Oral consent was provided by the patient.     BACKGROUND INFORMATION/REASON FOR REFERRAL: [name] was seen for a psychological evaluation as one part of a comprehensive assessment for kidney transplantation and for treatment planning. She is a 50 y.o. married African-American female from [place], [place]. She has been diagnosed with ESRD on dialysis.     BEHAVIORAL OBSERVATIONS:   Ms. Robertson arrived for her appointment on time. She was interviewed alone. Rapport was easily established. She did not seem motivated to present  herself in an overly favorable light.    MENTAL STATUS EXAM:  Appearance: unable to assess  Motor: unable to assess  Speech/Language: Normal rate, volume, tone, fluency  Mood: good  Affect: Calm and Cooperative  Thought Process: Logical, linear, clear, coherent, goal directed  Thought Content: Denies SI, HI, self harm, delusions, obsessions, paranoid ideation, or ideas of reference  Perceptual Disturbances: Denies auditory and visual hallucinations, behavior not concerning for response to internal stimuli  Orientation: Oriented to person, place, time, and general circumstances  Attention: Able to fully attend without fluctuations in consciousness  Concentration: Able to fully concentrate and attend  Memory: Immediate, short-term, long-term, and recall grossly intact  Fund of Knowledge: Consistent with level of education and development  Insight: Intact  Judgment: Intact  Impulse Control: Intact    HEALTH HISTORY:  Onset: In the 1990s; started dialysis 2021. FSGS caused kidney disease  Current Symptoms: easily fatigued  Pain (0=no pain; 10=worst pain imaginable): 4/10  Pain Medications:  use them as prescribed  Family Health History:   Family History   Adopted: Yes   Problem Relation Age of Onset   ??? Heart attack Mother 84   ??? No Known Problems Father    ??? No Known Problems Sister    ??? No Known Problems Son    ??? No Known Problems Daughter    ??? No Known Problems Brother    ??? No Known Problems Maternal Aunt    ??? No Known Problems Maternal Uncle    ??? No Known Problems Paternal Aunt    ??? No Known Problems Paternal Uncle    ??? No Known Problems Maternal Grandmother    ??? No Known Problems Maternal Grandfather    ??? No Known Problems Paternal Grandmother    ??? No Known Problems Paternal Grandfather    ??? No Known Problems Other    ??? Anesthesia problems Neg Hx    ??? Broken bones Neg Hx    ??? Cancer Neg Hx    ??? Clotting disorder Neg Hx    ??? Collagen disease Neg Hx    ??? Diabetes Neg Hx    ??? Dislocations Neg Hx    ??? Fibromyalgia Neg Hx    ??? Gout Neg Hx    ??? Hemophilia Neg Hx    ??? Osteoporosis Neg Hx    ??? Rheumatologic disease Neg Hx    ??? Scoliosis Neg Hx    ??? Severe sprains Neg Hx    ??? Sickle cell anemia Neg Hx    ??? Spinal Compression Fracture Neg Hx    ??? Melanoma Neg Hx    ??? Basal cell carcinoma Neg Hx    ??? Squamous cell carcinoma Neg Hx      is adopted.     Medications:   Current Outpatient Medications   Medication Sig Dispense Refill   ??? adalimumab (HUMIRA) 40 mg/0.8 mL subcutaneous pen kit Inject the contents of 2 pens (80mg ) under the skin once for initial dose, then inject 1 pen (40mg ) every 2 weeks. 6 each 3   ??? allopurinoL (ZYLOPRIM) 100 MG tablet Take 1 tablet (100 mg total) by mouth Every Tuesday, Thursday and Saturday. 36 tablet 3   ??? amLODIPine (NORVASC) 10 MG tablet Take 1 tablet (10 mg total) by mouth daily. 90 tablet 3   ??? apixaban (ELIQUIS) 5 mg Tab Take 5 mg by mouth daily.     ??? aspirin 81 MG chewable tablet Chew 1 tablet (81 mg total) daily. 36 tablet 0 ??? atorvastatin (LIPITOR) 80 MG tablet Take 1 tablet (80 mg total) by mouth daily. 30 tablet 0   ??? beclomethasone dipropionate (QVAR REDIHALER) 80 mcg/actuation inhaler 1 puff every morning, 2 puffs every evening (Patient taking differently: continuous as needed. 1 puff every morning, 2 puffs every evening as needed) 10.6 g 0   ??? blood glucose control, normal (ACCU-CHEK SMARTVIEW CONTRL SOL) Soln 1 Bottle by Miscellaneous route once as needed. for up to 1 dose Use with each new box of test strips and as instructed 1 each 2   ??? blood sugar diagnostic (ACCU-CHEK SMARTVIEW TEST STRIP) Strp by Other route every morning before breakfast. For use with glucometer to check blood sugar daily 50 strip 5   ???  blood-glucose meter (ACCU-CHEK NANO) Misc 1 kit by Miscellaneous route every morning before breakfast. To check glucose level daily 1 each 0   ??? clobetasoL (TEMOVATE) 0.05 % ointment Place ointment on outside of vulva nightly for 2 weeks and then twice a week thereafter. 45 g 0   ??? evolocumab (REPATHA SURECLICK) 140 mg/mL PnIj Inject the contents of 1 pen (140 mg) under the skin every fourteen (14) days. 6 mL 3   ??? furosemide (LASIX) 40 MG tablet Take 1 tablet (40 mg total) by mouth once as needed. To be taken once a day if you develop leg swelling. 60 tablet 11   ??? gabapentin (NEURONTIN) 100 MG capsule Take 1 capsule (100 mg total) by mouth Three (3) times a day. 90 capsule 0   ??? isosorbide mononitrate (IMDUR) 120 MG 24 hr tablet Take 1 tablet (120 mg total) by mouth daily. 30 tablet 0   ??? lancets (ACCU-CHEK FASTCLIX LANCET DRUM) Misc 1 Units by Miscellaneous route every morning before breakfast. 102 each 5   ??? lancing device with lancets (ACCU-CHEK FASTCLIX LANCING DEV) Kit 1 kit by Miscellaneous route every morning before breakfast. With glucometer/lancets to check blood sugar daily 1 each 1   ??? oxybutynin (DITROPAN) 5 MG tablet Take 1 tablet (5 mg total) by mouth Two (2) times a day. 30 tablet 2   ??? triamcinolone (KENALOG) 0.1 % ointment APPLY  TOPICALLY TWICE DAILY TO  AFFECTED  AREAS (Patient not taking: Reported on 01/27/2021) 454 g 0     No current facility-administered medications for this visit.     Psychiatric/Medical History:  Past Medical History:   Diagnosis Date   ??? Abnormal mammogram    ??? Anxiety    ??? Arthritis    ??? Asthma    ??? Back pain    ??? Depression    ??? ESRD (end stage renal disease) on dialysis (CMS-HCC)    ??? FSGS (focal segmental glomerulosclerosis) 1997    renal biopsy at [facility]   ??? GERD (gastroesophageal reflux disease)    ??? Gout    ??? H/O adenoidectomy     had adenoids removed   ??? Hypercholesteremia    ??? Hypertension    ??? Pancreatitis    ??? Seizure (CMS-HCC)     unknown etiology; none for several years     Surgical History:  Past Surgical History:   Procedure Laterality Date   ??? APPENDECTOMY     ??? BREAST EXCISIONAL BIOPSY Right 11/28/2016   ??? CHG US GUIDE, VASCULAR ACCESS N/A 01/29/2020    Procedure: ULTRASOUND GUIDANCE FOR VASC ACCESS REQUIRING [place] EVAL OF POTENTIAL ACCESS SITES;  Surgeon: Leona Carry, MD;  Location: MAIN OR [facility];  Service: Transplant   ??? CHG US GUIDE, VASCULAR ACCESS N/A 06/08/2020    Procedure: ULTRASOUND GUIDANCE FOR VASC ACCESS REQUIRING [place] EVAL OF POTENTIAL ACCESS SITES;  Surgeon: Leona Carry, MD;  Location: MAIN OR [facility];  Service: Transplant   ??? CHOLECYSTECTOMY     ??? COMBINED HYSTEROSCOPY DIAGNOSTIC / D&C  07/07/2015    Planned for endometrial ablation, but patient had uterine perforation after D&C and unable to perform   ??? LEFT OOPHORECTOMY Left 04/12/2000    Removed with L fallopian tube for ectopic pregnancy   ??? PR CREAT AV FISTULA,AUTOGENOUS GRAFT Left 01/29/2020    Procedure: Create Av Fistula (Separt Proc); Verner Mould;  Surgeon: Leona Carry, MD;  Location: MAIN OR [facility];  Service: Transplant   ??? PR CREAT AV  FISTULA,NON-AUTOGENOUS GRAFT Left 06/08/2020    Procedure: CREATE AV FISTULA (SEPARATE PROC); NONAUTOGENOUS GRAFT (EG, BIOLOGICAL COLLAGEN, THERMOPLASTIC GRAFT);  Surgeon: Leona Carry, MD;  Location: MAIN OR [facility];  Service: Transplant   ??? PR EXPLORATION N/FLWD SURG UPPER EXTREMITY ARTERY Left 08/02/2020    Procedure: Exploration Not Followed By Surgical Repair, Artery; Upper Extremity (Eg, Axillary, Brachial, Radial, Ulnar);  Surgeon: Earney Mallet, MD;  Location: MAIN OR [facility];  Service: Vascular   ??? PR INCIS/DRAIN ARM,DEEP ABSC/HEMATOMA Left 08/14/2020    Procedure: INCISION AND DRAINAGE, UPPER ARM OR ELBOW AREA; DEEP ABSCESS OR HEMATOMA;  Surgeon: Earney Mallet, MD;  Location: MAIN OR [facility];  Service: Vascular   ??? PR INSERTION TUNNEL INTRAPERITONEAL CATH DIAL OPEN Midline 02/01/2021    Procedure: INSERTION OF INTRAPERITONEAL CANNULA OR CATHETER FOR DRAINAGE OR DIALYSIS; PERMANENT;  Surgeon: Loney Hering, MD;  Location: MAIN OR [facility];  Service: Transplant   ??? PR REMV ART CLOT AXILL-BRACH,ARM INCIS Left 08/02/2020    Procedure: Embolect/Thrombec; Axilry/Brachial Art-Arm Incs;  Surgeon: Earney Mallet, MD;  Location: MAIN OR [facility];  Service: Vascular   ??? PR UPPER GI ENDOSCOPY,BIOPSY N/A 08/29/2017    Procedure: UGI ENDOSCOPY; WITH BIOPSY, SINGLE OR MULTIPLE;  Surgeon: Neysa Hotter, MD;  Location: GI PROCEDURES [place] [facility];  Service: Gastroenterology   ??? PR VEIN BYPASS GRAFT,SUBCL-BRACHIAL Left 08/05/2020    Procedure: Bypass Graft, With Vein; Subclavian-Brachial;  Surgeon: Earney Mallet, MD;  Location: MAIN OR [facility];  Service: Vascular   ??? SALPINGECTOMY Left 04/12/2000    Ectopic pregnancy   ??? SALPINGECTOMY Right 11/22/2015    at time of total vaginal hysterectomy   ??? TONSILECTOMY, ADENOIDECTOMY, BILATERAL MYRINGOTOMY AND TUBES     ??? TOTAL VAGINAL HYSTERECTOMY  11/22/2015    With R salpingectomy       ADHERENCE ISSUES:  Diet: Type: Low sodium and Renal  Diet Adherence: Good  Medication Management: Pt sometimes uses a pill box, but typically uses the bottles and takes them after her meals. Medication Independence: Independent  Medication Adherence: Good  Medication Concerns: denied problems taking medications, concerns about side effects, affordability, problems obtaining medications, and difficulty remembering medications  Attendance to appointments: Good; Husband drives and she takes [employer] transport to dialysis     Health Literacy Estimation:  Good     Labwork: Good  Dialysis:Good  Fluid Restrictions: N/A  Diabetes:  Borderline manages through diet  Sleep Apnea:  Pt is working on getting a CPAP      SUBSTANCE USE ISSUES:   Nicotine/Tobacco: Former smoker; quit several years ago. 1PPD prior  Current alcohol use: Denied; last use several years ago  Heaviest use in the past: very little  Past/current treatment: Denied  Illicit drug use: edible cannabis . Regular use in her 40s.  Last use Fall 2021. Pt made aware of cannabis policy.   -If yes type: marijuana  Licit substance abuse or misuse: Denied  Alcohol or drug-related legal problems: Denied  Relapse risk: low    TRANSPLANT ISSUES:  Want transplant? yes   Attitude toward transplant: positive, excited  Fears/Concerns about transplant: rejection   Understanding of the transplant process: Good  Caregivers: spouse and son  Ashby Dawes of caregiver's relationship to recipient: positive, supportive  Goals for the future/expectations after transplant: realistic  Pain Coping: Fair    MENTAL HEALTH HISTORY:   Psychiatric diagnoses: Pt reported a history a PTSD, Bipolar Disorder, and Schizophrenia.  She guessed this was in her 88's. She was  adopted and loss her adoptive parents. She was a single mother and was experiencing significant stress.     Pt endorsed seeing human shadows on rare occasion. She also endorsed experiencing trouble with reality at times. These symptoms appear to be associated with times of high stress or grief. She described memory impairment at as well. Unclear if this is related to a cognitive issue or distraction due to trauma/stress/depression. Pt attributed it to stress. Pt has been experiencing hypersomnia. She denied ever experiencing manic symptoms. Pt stated she was likely diagnosed with Bipolar because she would have verbal outbursts that sounded more consistent with irritability in the setting of increased stress and trauma.     Psychiatric hospitalizations: 3x ;  Suicide attempt, significant grief reaction after grand daughter (2016).   Previous treatment: Yes  Suicide attempts: Overdose attempt in her 70s.    Suicidal or homicidal ideation: In the past, but denied any recent thoughts.  Passive SI in fall 2021 during hospitalization   Thoughts of death: Denied  Self-Injurious behavior: Denied  Family mental health history: Biological mother - mentally challenged  Psychiatric medication: Paxil, Lexapro, Latuda, and others hse could not recall. No current meds for about 3 years.  She discontinued meds because of side effects. She has felt relatively stable since being off of the medications.   Mental health treatment: No, but open to it.   Psychotic symptoms: Pt described seeing shadows occasionally.  Past trauma/abuse: Yes  Aggression: Yes, verbally in the past  Impulsive behaviors: Denied  History of head injury: Denied    Coping Style: Functional    FAMILY AND SOCIAL FUNCTIONING:   Marital status: married  Children: 2 adult children  Current living situation: with spouse, son, and son's partner  Recent stressors: relationship with 36 year old daughter is tense right now.   Legal history: No  Education level: HS  Academic difficulties: Denied  Do any spiritual or religious beliefs impact medical care? 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 I reviewed the impact of the use of illicit substances, substance abuse, and alcohol use/abuse on patient and graft survival as well as factors that influence relapse risk. The consequences of noncompliance both pre- and post-transplant were reviewed. I reviewed the possibility of the development and/or worsening of psychiatric issues, including but not limited to depression, anxiety, acute and posttraumatic stress disorder, guilt, and body image issues, and encouraged the patient and their caregivers to speak with members of the transplant team if psychiatric symptoms develop or worsen. Understanding was expressed.    COLLATERAL INFORMATION:  Review of PMP [place]ware controlled substance database showed     Review of [address] Department [employer] Assess System showed no history.     PSYCHIATRIC DIAGNOSES:   PTSD; Prolonged Grief Disorder; History of suicidal ideation and attempt                            IMPRESSIONS:  1. Adherence: No concerns. Pt reported good adherence to medication regimen, attendance to appointments, and follow up on medical recommendations.   2. Substance Use Issues: Pt is a former smoker. She very rarely uses alcohol. She has a history of cannabis use, but has not used in over 6 months. Pt made aware of cannabis policy.  3. Transplant Issues:  Ms. Robertson appears to have a good understanding of her disease process and the transplant surgery. She was able to articulate the risks associated with surgery as well as the post-transplant care required. Her expectations of life post-transplant are reasonable, and she has had significant experience with being a patient and requiring frequent medical care. She expressed commitment and motivation to follow medical advice after transplant as well.   4. Psychological Issues: Pt has a history of mental health diagnoses including Bipolar Disorder, Schizophrenia, and PTSD. PTSD as well as Prolonged Grief Disorder appears to best account for her report of historical and current symptoms. Pt endorsed an increase of symptoms including hypersomnia, visual hallucinations, depersonalization, sadness, and irritability when under subjective stress. Pt experienced several deaths of significant people in life. Her symptoms are related to these losses. Pt was hospitalized three times in her 20's due to a few of these losses resulting in a suicide attempt by overdose. Pt denied experiencing any active SI or plan since that time, but did endorse vague/passive SI during her hospitalization in fall 2021. Pt would benefit from treatment to address her current symptoms and increase coping skills. She should establish care prior to consideration for listing.   5. Social: No concerns. Pt appears to have stable housing and finances. Her social support appears adequate. Please see transplant social work assessment for further details.     RECOMMENDATIONS:  Omar Robertson is believed to be a fair candidate for transplant from a psychological perspective.    We have some concerns about Ms. Elvis Coil candidacy on the transplant list. To optimize her outcomes while listed and after transplant, the following is recommended:     1. Pt should establish care with a therapist to address her current mental health symptoms and increase coping skills PRIOR to consideration for listing.   2. Pt should continue to adhere to all medical recommendations.   3. Pt should participate in annual assessment with psychology.     Should the patient or treatment team notice a change in functioning, please refer back to transplant psychology for further evaluation and treatment.    Final decision regarding listing status is based upon committee review at selection meeting.      Recommendations discussed with patient? yes  Agreed upon by patient? yes    [name] was given this writer's business card with confidential voice mail number and instructed to call 911 for emergencies. regarding listing status is based upon committee review at selection meeting.    ***    Recommendations discussed with patient? {TXPYES/NO:[id number]}  Agreed upon by patient? {TXPYES/NO:[id number]}    [name] was given this writer's business card with confidential voice mail number and instructed to call 911 for emergencies.

## 2021-02-13 NOTE — Unmapped (Signed)
*The following clinic H+P was reviewed and there are no changes    Transplant Surgery History and Physical  ??  Assessment/Recommendations:  Megan Robertson is a 50 y.o. female seen in consultation at the request of Dept--[place], Prudy Feeler* for evaluation for candidacy for renal transplantation in addition to peritoneal dialysis catheter placement evaluation.   ??  I spent 30 minutes with the patient obtaining the above history and physical examination, and greater than 50% of the time was spent on counseling and the substance of the discussion.  ??  Today we discussed renal transplantation going over the surgery, the hospital course including length of stay, anti-rejection medications and their side effects, results and the cadaveric donor system and living donor options.  ??  In regards to the surgical procedure, this is a major operation performed under general anesthesia with the risks of heart attack, stroke and death.  Multiple invasive means of monitoring may be necessary during the operation including an arterial line, a central venous catheter, a foley catheter inserted into the bladder, and a tube from your nose into your stomach to prevent stomach distension. After surgery, the patient usually goes to a monitored unit and is then sent to the regular ward. The incisional area is subject to the risk of infection and hernia and this risk is increased by obesity, diabetes and the immunosuppressive medications. Numbness can occur over the area of any surgical incision. The less common surgical complications (less than 5%) include blood clots in the legs or pelvis that may travel to the lungs, blood vessel infection or clotting, bleeding, urinary leaks, and lymphocele. Urgent/emergent re-operation may be required after a transplant for any of the above complications. One important point following surgery is that the hospital course can be prolonged, and dialysis may be necessary for some time, if the kidney does not function immediately (ATN). Less than 5% of the time, a transplanted kidney may never function or may function for only a short period of time.  ??  Anti-rejection medications, including FK506 (Prograf) or Cyclosporine (Neoral), Cellcept, steroids and others, will be needed as long as the kidney is viable. Problems include infection, cancer, hirsutism, tremors, gum swelling, hypertension, bone fractures, aggravation of diabetes or new onset diabetes, cataracts, and rashes.  ??  The donor system was reviewed. Although all donors are tested for all known infections, there is a persisting chance of transmission of viruses or other infections as well as possible transmission of tumors. We reviewed the advantages and disadvantages of living and deceased donor renal transplantation. We also reviewed deceased donation after cardiac death and extended criteria donors. We also discussed crossmatch tests and their inability to predict some delayed antibody-mediated rejection episodes.  ??  Finally, I reviewed with the patient how the surgery is expected to improve their health and quality of life, that the average length of hospitalization stay is 3-5 days, and that the length of their expected recovery period, including when normal daily activities may be resumed, will be patient dependent.  ??  Megan Robertson  had all questions answered and was urged to call us at any time if additional questions should arise.  ??  This patient was seen and evaluated with Dr. Doyne Keel, patient is a good surgical candidate but needs further testing/evaluation prior to surgical clearance.  ??  Plan:  Urgently needed tests prior to PD catheter placement:  -Needs Vascular Surgery/Hematology consulted for anticoagulation (apixaban) need due to AVF thrombosis with LUE bypass with acute limb ischemia  -  Needs cardiac testing and CT for vascular evaluation  -Has been consented for PD catheter procedure  ??  Other studies needed prior to kidney transplant:  -Needs clearance from OB-GYN due to hx of ovarian cancer  - walk test in clinic  -Physical therapy evaluation for deconditioning of right leg from prior surgery  -Dermatology consult for non-erythematous abdominal rash  ??  ??  HPI:  Megan Robertson is a 50 y.o. 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 Patient states she makes urine and voids multiple times per day, but volume has decreased over the past few months. She now gets dialysis via trialysis catheter in R internal jugular. Since her procedure in October 2021 for acute limb ischemia w/t AV access thrombosis, she has required a cane for ambulation, has been receiving PT. States she becomes dyspneic with exertion and it is difficult for her to climb 1 flight of stairs (quickly becomes SOB). Pt states since receiving her trialysis catheter, she is unable to tolerate iHD (has nausea, vomiting, fatigue, generalized malaise) and therefore prefers PD.  ??  Cause of kidney disease: FSGS  On dialysis?: Yes, TThS  Prior transplants: No  Prior blood product transfusions: None known  Kidney stones: No  Frequent UTI: No  History of heart disease: No????      ??  Allergies  Cephalexin, Mango, Peach (prunus persica), Wellbutrin [bupropion hcl], Egg, Fish containing products, Lactase-rennet, Mushroom, and Tomato (solanum lycopersicum)  ??  ??  Medications    Current Medications   Current Outpatient Medications   Medication Sig Dispense Refill   ??? adalimumab (HUMIRA) 40 mg/0.8 mL subcutaneous pen kit Inject the contents of 2 pens (80mg ) under the skin once for initial dose, then inject 1 pen (40mg ) every 2 weeks. 6 each 3   ??? allopurinoL (ZYLOPRIM) 100 MG tablet Take 1 tablet (100 mg total) by mouth Every Tuesday, Thursday and Saturday. 36 tablet 3   ??? amLODIPine (NORVASC) 10 MG tablet Take 1 tablet (10 mg total) by mouth daily. 90 tablet 3   ??? aspirin 81 MG chewable tablet Chew 1 tablet (81 mg total) daily. 36 tablet 0   ??? atorvastatin (LIPITOR) 80 MG tablet Take 1 tablet (80 mg total) by mouth daily. 30 tablet 0   ??? beclomethasone dipropionate (QVAR REDIHALER) 80 mcg/actuation inhaler 1 puff every morning, 2 puffs every evening (Patient taking differently: continuous as needed. 1 puff every morning, 2 puffs every evening as needed) 10.6 g 0   ??? blood glucose control, normal (ACCU-CHEK SMARTVIEW CONTRL SOL) Soln 1 Bottle by Miscellaneous route once as needed. for up to 1 dose Use with each new box of test strips and as instructed 1 each 2   ??? blood sugar diagnostic (ACCU-CHEK SMARTVIEW TEST STRIP) Strp by Other route every morning before breakfast. For use with glucometer to check blood sugar daily 50 strip 5   ??? blood-glucose meter (ACCU-CHEK NANO) Misc 1 kit by Miscellaneous route every morning before breakfast. To check glucose level daily 1 each 0   ??? clobetasoL (TEMOVATE) 0.05 % ointment Place ointment on outside of vulva nightly for 2 weeks and then twice a week thereafter. (Patient not taking: Reported on 10/24/2020) 45 g 0   ??? ergocalciferol (DRISDOL)  1,250 mcg (50,000 unit) capsule Take 1 capsule (50,000 Units total) by mouth once a week. (Patient not taking: Reported on 10/24/2020) 4 capsule 11   ??? furosemide (LASIX) 40 MG tablet Take 1 tablet (40 mg total) by mouth once as needed. To be taken once a day if you develop leg swelling. 60 tablet 11   ??? gabapentin (NEURONTIN) 100 MG capsule Take 1 capsule (100 mg total) by mouth Three (3) times a day. (Patient not taking: Reported on 10/24/2020) 90 capsule 0   ??? isosorbide mononitrate (IMDUR) 120 MG 24 hr tablet Take 1 tablet (120 mg total) by mouth daily. 30 tablet 0   ??? lancets (ACCU-CHEK FASTCLIX LANCET DRUM) Misc 1 Units by Miscellaneous route every morning before breakfast. 102 each 5   ??? lancing device with lancets (ACCU-CHEK FASTCLIX LANCING DEV) Kit 1 kit by Miscellaneous route every morning before breakfast. With glucometer/lancets to check blood sugar daily 1 each 1   ??? oxybutynin (DITROPAN) 5 MG tablet Take 1 tablet (5 mg total) by mouth Two (2) times a day. (Patient not taking: Reported on 10/24/2020) 30 tablet 2   ??? PARoxetine (PAXIL) 10 MG tablet Take 1 tablet (10 mg total) by mouth nightly. 30 tablet 0   ??? triamcinolone (KENALOG) 0.1 % ointment APPLY  TOPICALLY TWICE DAILY TO  AFFECTED  AREAS (Patient not taking: Reported on 10/24/2020) 454 g 0   ??  No current facility-administered medications for this visit.      ??  ??  ??  Past Medical History  Past Medical History[] Expand by Default        Past Medical History:   Diagnosis Date   ??? Abnormal mammogram ??   ??? Anxiety ??   ??? Arthritis ??   ??? Asthma ??   ??? Back pain ??   ??? Depression ??   ??? ESRD (end stage renal disease) on dialysis (CMS-HCC) ??   ??? FSGS (focal segmental glomerulosclerosis) 1997   ?? renal biopsy at [facility]   ??? GERD (gastroesophageal reflux disease) ??   ??? Gout ??   ??? H/O adenoidectomy ??   ?? had adenoids removed   ??? Hypercholesteremia ??   ??? Hypertension ??   ??? Pancreatitis ??   ??? Seizure (CMS-HCC) ??   ?? unknown etiology; none for several years      ??  ??  ??  Past Surgical History  Past Surgical History[] Expand by Default         Past Surgical History:   Procedure Laterality Date   ??? APPENDECTOMY ?? ??   ??? BREAST EXCISIONAL BIOPSY Right 11/28/2016   ??? CHG US GUIDE, VASCULAR ACCESS N/A 01/29/2020   ?? Procedure: ULTRASOUND GUIDANCE FOR VASC ACCESS REQUIRING [place] EVAL OF POTENTIAL ACCESS SITES;  Surgeon: Leona Carry, MD;  Location: MAIN OR [facility];  Service: Transplant   ??? CHG US GUIDE, VASCULAR ACCESS N/A 06/08/2020   ?? Procedure: ULTRASOUND GUIDANCE FOR VASC ACCESS REQUIRING [place] EVAL OF POTENTIAL ACCESS SITES;  Surgeon: Leona Carry, MD;  Location: MAIN OR [facility];  Service: Transplant   ??? CHOLECYSTECTOMY ?? ??   ??? COMBINED HYSTEROSCOPY DIAGNOSTIC / D&C ?? 07/07/2015   ?? Planned for endometrial ablation, but patient had uterine perforation after D&C and unable to perform   ??? LEFT OOPHORECTOMY Left 04/12/2000   ?? Removed with L fallopian tube for ectopic pregnancy   ??? PR CREAT AV FISTULA,AUTOGENOUS GRAFT Left 01/29/2020   ?? Procedure: Create Av Fistula (Separt Proc);  Verner Mould;  Surgeon: Leona Carry, MD;  Location: MAIN OR [facility];  Service: Transplant   ??? PR CREAT AV FISTULA,NON-AUTOGENOUS GRAFT Left 06/08/2020   ?? Procedure: CREATE AV FISTULA (SEPARATE PROC); NONAUTOGENOUS GRAFT (EG, BIOLOGICAL COLLAGEN, THERMOPLASTIC GRAFT);  Surgeon: Leona Carry, MD;  Location: MAIN OR [facility];  Service: Transplant   ??? PR EXPLORATION N/FLWD SURG UPPER EXTREMITY ARTERY Left 08/02/2020   ?? Procedure: Exploration Not Followed By Surgical Repair, Artery; Upper Extremity (Eg, Axillary, Brachial, Radial, Ulnar);  Surgeon: Earney Mallet, MD;  Location: MAIN OR [facility];  Service: Vascular   ??? PR INCIS/DRAIN ARM,DEEP ABSC/HEMATOMA Left 08/14/2020   ?? Procedure: INCISION AND DRAINAGE, UPPER ARM OR ELBOW AREA; DEEP ABSCESS OR HEMATOMA;  Surgeon: Earney Mallet, MD;  Location: MAIN OR [facility];  Service: Vascular   ??? PR REMV ART CLOT AXILL-BRACH,ARM INCIS Left 08/02/2020   ?? Procedure: Embolect/Thrombec; Axilry/Brachial Art-Arm Incs;  Surgeon: Earney Mallet, MD;  Location: MAIN OR [facility];  Service: Vascular   ??? PR UPPER GI ENDOSCOPY,BIOPSY N/A 08/29/2017   ?? Procedure: UGI ENDOSCOPY; WITH BIOPSY, SINGLE OR MULTIPLE;  Surgeon: Neysa Hotter, MD;  Location: GI PROCEDURES [facility];  Service: Gastroenterology   ??? PR VEIN BYPASS GRAFT,SUBCL-BRACHIAL Left 08/05/2020   ?? Procedure: Bypass Graft, With Vein; Subclavian-Brachial;  Surgeon: Earney Mallet, MD;  Location: MAIN OR [facility];  Service: Vascular   ??? SALPINGECTOMY Left 04/12/2000 Ectopic pregnancy   ??? SALPINGECTOMY Right 11/22/2015   ?? at time of total vaginal hysterectomy   ??? TONSILECTOMY, ADENOIDECTOMY, BILATERAL MYRINGOTOMY AND TUBES ?? ??   ??? TOTAL VAGINAL HYSTERECTOMY ?? 11/22/2015   ?? With R salpingectomy      ??  ??  ??  Family History  The patient's family history includes Heart attack (age of onset: 34) in her mother; No Known Problems in her brother, daughter, father, maternal aunt, maternal grandfather, maternal grandmother, maternal uncle, paternal aunt, paternal grandfather, paternal grandmother, paternal uncle, sister, son, and another family member. She was adopted..  ??  ??  Social History:  Tobacco use: former smoker  Alcohol use: occassional use  Drug use: denies  ??  ??  Review of Systems  A 12 system review of systems was negative except as noted in HPI  ??  PE: Blood pressure 130/79, pulse 101, temperature 37.4 ??C, temperature source Tympanic, height 157.5 cm (5' 2.01), weight 73.1 kg (161 lb 3.2 oz), not currently breastfeeding. Body mass index is 29.48 kg/m??.  General: Middle aged female, NAD  Lungs: Normal work of breathing on room air. Will perform 6-minute walk test in clinic today.  Heart: Regular rate and rhythm  Abd: soft, non-distended, non-tender. Prior transverse C-section scar well healed.   Pulses: 2+ bilateral femoral pulses  Skin: Diffuse non-erythematous papular rash throughout abdomen, non-tender  Ext: no edema, well perfused  Neuro: Alert, oriented, no focal deficits  ??  Test Results        Lab Results   Component Value Date   ?? WBC 6.2 10/24/2020   ?? HGB 12.9 10/24/2020   ?? HCT 43.0 10/24/2020   ?? PLT 246 10/24/2020   ??        Lab Results   Component Value Date   ?? NA 144 10/24/2020   ?? K 2.7 (L) 10/24/2020   ?? CL 107 10/24/2020   ?? CO2 27.0 10/24/2020   ?? BUN 13 10/24/2020   ?? CREATININE 3.67 (H) 10/24/2020   ?? CALCIUM 9.7 10/24/2020   ??  MG 1.9 10/24/2020   ?? PHOS 3.1 10/24/2020   ??        Lab Results   Component Value Date   ?? ALKPHOS 111 10/24/2020 BILITOT 0.7 10/24/2020   ?? BILIDIR 0.30 08/11/2020   ?? PROT 7.5 10/24/2020   ?? ALBUMIN 3.5 10/24/2020   ?? ALT 9 (L) 10/24/2020   ?? AST 23 10/24/2020   ??        Lab Results   Component Value Date   ?? INR 1.02 10/24/2020   ?? APTT 25.2 10/24/2020   ??  ??  Imaging: ECG 12 Lead  ??  Result Date: 09/19/2020  SINUS TACHYCARDIA OTHERWISE NORMAL ECG WHEN COMPARED WITH ECG OF 30-Jul-2020 16:17, NO SIGNIFICANT CHANGE WAS FOUND Confirmed by Rose-jones, Lisa ([id number]) on 09/19/2020 8:34:29 AM  ??  XR Chest 1 view Portable  ??  Result Date: 09/18/2020  EXAM: XR CHEST PORTABLE DATE: 09/18/2020 6:39 PM ACCESSION: [id number] UN DICTATED: 09/18/2020 8:00 PM INTERPRETATION LOCATION: [facility] CLINICAL INDICATION: 50 years old Female with CATHETER VASCULAR FIT&ADJ  COMPARISON: Chest radiograph 07/30/2020 TECHNIQUE: Portable Chest Radiograph. FINDINGS: Right IJ central venous catheter terminates over the right atrium. Lungs radiographically clear. No pleural effusion or pneumothorax. Unchanged cardiomediastinal silhouette. Surgical clips overlying the left lower neck/left lung apex.   ??  Right IJ approach central catheter terminates over the right atrium.

## 2021-02-14 ENCOUNTER — Ambulatory Visit: Admit: 2021-02-14 | Discharge: 2021-02-15 | Payer: MEDICARE

## 2021-02-14 DIAGNOSIS — L24A9 Wound drainage: Principal | ICD-10-CM

## 2021-02-14 NOTE — Unmapped (Signed)
Patient states she is doing well. No fevers. Pain very manahable. Sees her dialysis center today for PD cath eval

## 2021-02-27 ENCOUNTER — Ambulatory Visit: Admit: 2021-02-27 | Discharge: 2021-02-28 | Payer: MEDICARE

## 2021-02-27 ENCOUNTER — Ambulatory Visit
Admit: 2021-02-27 | Discharge: 2021-02-28 | Payer: MEDICARE | Attending: Student in an Organized Health Care Education/Training Program | Primary: Student in an Organized Health Care Education/Training Program

## 2021-02-27 DIAGNOSIS — N186 End stage renal disease: Principal | ICD-10-CM

## 2021-02-27 DIAGNOSIS — Z1231 Encounter for screening mammogram for malignant neoplasm of breast: Principal | ICD-10-CM

## 2021-02-27 DIAGNOSIS — Z0000 Encounter for general adult medical examination without abnormal findings: Principal | ICD-10-CM

## 2021-02-27 DIAGNOSIS — Z01818 Encounter for other preprocedural examination: Principal | ICD-10-CM

## 2021-02-27 NOTE — Unmapped (Signed)
Patient seen in surgery clinic 11/28/20 by  Dr. Doyne Keel for her kidney transplant evaluation. I spent 15 minutes with patient reviewing the kidney transplant evaluation process including testing, KSC, and listing procedure, pre-op, transplant surgery, and post-op considerations.  Patient verbalizes and acknowledges understanding of education provided today.  Patient education checklist signed today.  Patient able to have questions and concerns addressed with transplant team today.     Smoker:  no  Urine output:  yes  Living donor:  No and Amy Woodard, RN (Living Donor Coordinator) card given to pt for potential donors to call  SSN: Verified    HM: Already received:  PPD results         Still need to complete/request:  colonoscopy, pap smear, mammogram    Outstanding testing letter given to patient in clinic today and sent to dialysis unit / referring MD today as well.

## 2021-02-28 MED ORDER — GENTAMICIN 0.1 % TOPICAL CREAM
0.00000 days
Start: 2021-02-28 — End: ?

## 2021-03-01 NOTE — Unmapped (Signed)
The appointment was cancelled since the patient already cleared surgically for kidney transplant.

## 2021-03-08 ENCOUNTER — Ambulatory Visit: Admit: 2021-03-08 | Discharge: 2021-03-09 | Payer: MEDICARE | Attending: Vascular Surgery | Primary: Vascular Surgery

## 2021-03-08 ENCOUNTER — Ambulatory Visit: Admit: 2021-03-08 | Discharge: 2021-03-09 | Payer: MEDICARE

## 2021-03-08 DIAGNOSIS — M79602 Pain in left arm: Principal | ICD-10-CM

## 2021-03-08 DIAGNOSIS — I998 Other disorder of circulatory system: Principal | ICD-10-CM

## 2021-03-08 NOTE — Unmapped (Signed)
Visit date:  03/08/21  ??  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  She is one month s/p LUE angiogram with PTA of the distal anastomosis of the left subclavian to brachial bypass and PTA of the left brachial artery.  Duplex imaging demonstrates patent bypass however she has occlusion of the mid to distal brachial artery, dampened flow in the in the radial artery and dampened flow in the proximal to mid ulnar artery.  Her symptoms have not worsened in severity since her last visit.  Megan Robertson was informed that we have no further intervention to offer her at this time.  We recommend she continue with medical management with eliquis.  We will follow-up with her in 6 months with repeat LUE arterial duplex.       HPI: Megan Robertson is a 50 y.o. female with a history of asthma, FSGS, GERD, gout, HLD, HTN and ESRD on dialysis. ??She underwent left??brachio-cephalic??vein AVF??(01/29/20) which thrombosed prior to maturation and underwent left brachiobasilic AVG??on 06/08/20 with Dr. Norma Fredrickson which also failed and was never used. ??Since the brachiobasilic AVG was created in August, she experienced left arm pain, numbness in the hand, and some weakness of the left upper extremity. These symptoms initially had some improvement with grip strengh exercises.????She presented to the hospital on 08/02/20 after having severe pain, cold fingertips, paresthesias and decreased LUE pulses with arterial duplex demonstrating occlusion of mid/distal brachial artery??and no flow in distal radial artery or ulnar artery.????On 10/12 she underwent surgical exposure of left brachial artery??and thromboembolectomy of left brachial artery and ulnar artery. ??On 10/15 she underwent left subclavian to brachial artery bypass using reversed GSV??with right lower extremity GSV harvest. ??She developed leukocytosis with CT imaging demonstrating a 3 cm collection with air in the upper arm near the vein graft, and on 10/24 she underwent LUE wound exploration and washout, excisional debridement of subcutaneous tissue and wound vac placement. ??She finished a 14 day course of antibiotics on 11/6. ??She was last seen in clinic on 01/25/21 with duplex findings of patent bypass with possible obstruction distal to the bypass graft with dampened flow in the radial and ulnar arteries.  On 02/09/21 she underwent LUE angiogram with PTA of the distal anastomosis of the left subclavian to brachial bypass and PTA of the left brachial artery.  She presents today for one month follow-up.        Megan Robertson presents to the clinic today with family.   She reports the same symptoms as prior to her most recent intervention, including: arm pain that alternates throughout the entire arm to her fingers, paresthesias and decreased left hand grip.  She has done OT for her LUE.  She recently underwent PD catheter placement and is currently receiving education regarding PD.  She continues to take eliquis.     Allergies   Allergen Reactions   ??? Cephalexin Itching     unknown; tolerates cefepime   ??? Mango Itching   ???  Peach (Prunus Persica) Itching   ??? Wellbutrin [Bupropion Hcl] Other (See Comments)     Seizure   ??? Egg Itching   ??? Fish Containing Products Itching   ??? Lactase-Rennet Nausea And Vomiting   ??? Lexiscan [Regadenoson] Nausea And Vomiting     Patient had nausea after the Lexi then vomitted [id number]   ??? Mushroom Itching   ??? Tomato (Solanum Lycopersicum) Itching       Current Outpatient Medications   Medication Sig Dispense Refill   ??? adalimumab (HUMIRA) 40 mg/0.8 mL subcutaneous pen kit Inject the contents of 2 pens (80mg ) under the skin once for initial dose, then inject 1 pen (40mg ) every 2 weeks. 6 each 3   ??? allopurinoL (ZYLOPRIM) 100 MG tablet Take 1 tablet (100 mg total) by mouth Every Tuesday, Thursday and Saturday. 36 tablet 3   ??? amLODIPine (NORVASC) 10 MG tablet Take 1 tablet (10 mg total) by mouth daily. 90 tablet 3   ??? apixaban (ELIQUIS) 5 mg Tab Take 5 mg by mouth daily.     ??? aspirin 81 MG chewable tablet Chew 1 tablet (81 mg total) daily. 36 tablet 0   ??? atorvastatin (LIPITOR) 80 MG tablet Take 1 tablet (80 mg total) by mouth daily. 30 tablet 0   ??? beclomethasone dipropionate (QVAR REDIHALER) 80 mcg/actuation inhaler 1 puff every morning, 2 puffs every evening (Patient taking differently: continuous as needed. 1 puff every morning, 2 puffs every evening as needed) 10.6 g 0   ??? blood glucose control, normal (ACCU-CHEK SMARTVIEW CONTRL SOL) Soln 1 Bottle by Miscellaneous route once as needed. for up to 1 dose Use with each new box of test strips and as instructed 1 each 2   ??? blood sugar diagnostic (ACCU-CHEK SMARTVIEW TEST STRIP) Strp by Other route every morning before breakfast. For use with glucometer to check blood sugar daily 50 strip 5   ??? blood-glucose meter (ACCU-CHEK NANO) Misc 1 kit by Miscellaneous route every morning before breakfast. To check glucose level daily 1 each 0   ??? clobetasoL (TEMOVATE) 0.05 % ointment Place ointment on outside of vulva nightly for 2 weeks and then twice a week thereafter. 45 g 0   ??? evolocumab (REPATHA SURECLICK) 140 mg/mL PnIj Inject the contents of 1 pen (140 mg) under the skin every fourteen (14) days. 6 mL 3   ??? furosemide (LASIX) 40 MG tablet Take 1 tablet (40 mg total) by mouth once as needed. To be taken once a day if you develop leg swelling. 60 tablet 11   ??? gabapentin (NEURONTIN) 100 MG capsule Take 1 capsule (100 mg total) by mouth Three (3) times a day. 90 capsule 0   ??? isosorbide mononitrate (IMDUR) 120 MG 24 hr tablet Take 1 tablet (120 mg total) by mouth daily. 30 tablet 0   ??? lancets (ACCU-CHEK FASTCLIX LANCET DRUM) Misc 1 Units by Miscellaneous route every morning before breakfast. 102 each 5   ??? lancing device with lancets (ACCU-CHEK FASTCLIX LANCING DEV) Kit 1 kit by Miscellaneous route every morning before breakfast. With glucometer/lancets to check blood sugar daily 1 each 1   ??? oxybutynin (DITROPAN) 5 MG tablet Take 1 tablet (5 mg total) by mouth Two (2) times a day. 30 tablet 2   ??? triamcinolone (KENALOG) 0.1 % ointment APPLY  TOPICALLY TWICE DAILY TO  AFFECTED  AREAS (Patient not taking: Reported on 01/27/2021) 454 g 0     No current facility-administered medications for this visit.  Past Medical History:   Diagnosis Date   ??? Abnormal mammogram    ??? Anxiety    ??? Arthritis    ??? Asthma    ??? Back pain    ??? Depression    ??? ESRD (end stage renal disease) on dialysis (CMS-HCC)    ??? FSGS (focal segmental glomerulosclerosis) 1997    renal biopsy at [facility]   ??? GERD (gastroesophageal reflux disease)    ??? Gout    ??? H/O adenoidectomy     had adenoids removed   ??? Hypercholesteremia    ??? Hypertension    ??? Pancreatitis    ??? Seizure (CMS-HCC)     unknown etiology; none for several years       Past Surgical History:   Procedure Laterality Date   ??? APPENDECTOMY     ??? BREAST EXCISIONAL BIOPSY Right 11/28/2016   ??? CHG US GUIDE, VASCULAR ACCESS N/A 01/29/2020    Procedure: ULTRASOUND GUIDANCE FOR VASC ACCESS REQUIRING [place] EVAL OF POTENTIAL ACCESS SITES;  Surgeon: Leona Carry, MD;  Location: MAIN OR [facility];  Service: Transplant   ??? CHG US GUIDE, VASCULAR ACCESS N/A 06/08/2020    Procedure: ULTRASOUND GUIDANCE FOR VASC ACCESS REQUIRING [place] EVAL OF POTENTIAL ACCESS SITES;  Surgeon: Leona Carry, MD;  Location: MAIN OR [facility];  Service: Transplant   ??? CHOLECYSTECTOMY     ??? COMBINED HYSTEROSCOPY DIAGNOSTIC / D&C  07/07/2015 Planned for endometrial ablation, but patient had uterine perforation after D&C and unable to perform   ??? LEFT OOPHORECTOMY Left 04/12/2000    Removed with L fallopian tube for ectopic pregnancy   ??? PR CREAT AV FISTULA,AUTOGENOUS GRAFT Left 01/29/2020    Procedure: Create Av Fistula (Separt Proc); Verner Mould;  Surgeon: Leona Carry, MD;  Location: MAIN OR [facility];  Service: Transplant   ??? PR CREAT AV FISTULA,NON-AUTOGENOUS GRAFT Left 06/08/2020    Procedure: CREATE AV FISTULA (SEPARATE PROC); NONAUTOGENOUS GRAFT (EG, BIOLOGICAL COLLAGEN, THERMOPLASTIC GRAFT);  Surgeon: Leona Carry, MD;  Location: MAIN OR [facility];  Service: Transplant   ??? PR EXPLORATION N/FLWD SURG UPPER EXTREMITY ARTERY Left 08/02/2020    Procedure: Exploration Not Followed By Surgical Repair, Artery; Upper Extremity (Eg, Axillary, Brachial, Radial, Ulnar);  Surgeon: Earney Mallet, MD;  Location: MAIN OR [facility];  Service: Vascular   ??? PR INCIS/DRAIN ARM,DEEP ABSC/HEMATOMA Left 08/14/2020    Procedure: INCISION AND DRAINAGE, UPPER ARM OR ELBOW AREA; DEEP ABSCESS OR HEMATOMA;  Surgeon: Earney Mallet, MD;  Location: MAIN OR [facility];  Service: Vascular   ??? PR INSERTION TUNNEL INTRAPERITONEAL CATH DIAL OPEN Midline 02/01/2021    Procedure: INSERTION OF INTRAPERITONEAL CANNULA OR CATHETER FOR DRAINAGE OR DIALYSIS; PERMANENT;  Surgeon: Loney Hering, MD;  Location: MAIN OR [facility];  Service: Transplant   ??? PR REMV ART CLOT AXILL-BRACH,ARM INCIS Left 08/02/2020    Procedure: Embolect/Thrombec; Axilry/Brachial Art-Arm Incs;  Surgeon: Earney Mallet, MD;  Location: MAIN OR [facility];  Service: Vascular   ??? PR UPPER GI ENDOSCOPY,BIOPSY N/A 08/29/2017    Procedure: UGI ENDOSCOPY; WITH BIOPSY, SINGLE OR MULTIPLE;  Surgeon: Neysa Hotter, MD;  Location: GI PROCEDURES [facility] [facility];  Service: Gastroenterology   ??? PR VEIN BYPASS GRAFT,SUBCL-BRACHIAL Left 08/05/2020    Procedure: Bypass Graft, With Vein; Subclavian-Brachial;  Surgeon: Earney Mallet, MD;  Location: MAIN OR [facility];  Service: Vascular   ??? SALPINGECTOMY Left 04/12/2000    Ectopic pregnancy   ??? SALPINGECTOMY Right 11/22/2015    at time of total  vaginal hysterectomy   ??? TONSILECTOMY, ADENOIDECTOMY, BILATERAL MYRINGOTOMY AND TUBES     ??? TOTAL VAGINAL HYSTERECTOMY  11/22/2015    With R salpingectomy        Social History     Socioeconomic History   ??? Marital status: Married     Spouse name: Not on file   ??? Number of children: Not on file   ??? Years of education: Not on file   ??? Highest education level: Not on file   Occupational History   ??? Not on file   Tobacco Use   ??? Smoking status: Former Smoker     Types: Cigarettes     Quit date: 06/30/2010     Years since quitting: 10.6   ??? Smokeless tobacco: Never Used   Vaping Use   ??? Vaping Use: Never used   Substance and Sexual Activity   ??? Alcohol use: No   ??? Drug use: No   ??? Sexual activity: Yes     Partners: Male     Birth control/protection: None   Other Topics Concern   ??? Do you use sunscreen? No   ??? Tanning bed use? No   ??? Are you easily burned? No   ??? Excessive sun exposure? No   ??? Blistering sunburns? No   Social History Narrative    on disability since 1994, but does not qualify anymore since she got married and her husband has income.     Lives in [place] with husband, Megan Robertson and 2 children     Social Determinants of Health     Financial Resource Strain: Not on file   Food Insecurity: Not on file   Transportation Needs: Not on file   Physical Activity: Not on file   Stress: Not on file   Social Connections: Not on file       Family History   Adopted: Yes   Problem Relation Age of Onset   ??? Heart attack Mother 62   ??? No Known Problems Father    ??? No Known Problems Sister    ??? No Known Problems Son    ??? No Known Problems Daughter    ??? No Known Problems Brother    ??? No Known Problems Maternal Aunt    ??? No Known Problems Maternal Uncle    ??? No Known Problems Paternal Aunt    ??? No Known Problems Paternal Uncle    ??? No Known Problems Maternal Grandmother    ??? No Known Problems Maternal Grandfather    ??? No Known Problems Paternal Grandmother    ??? No Known Problems Paternal Grandfather    ??? No Known Problems Other    ??? Anesthesia problems Neg Hx    ??? Broken bones Neg Hx    ??? Cancer Neg Hx    ??? Clotting disorder Neg Hx    ??? Collagen disease Neg Hx    ??? Diabetes Neg Hx    ??? Dislocations Neg Hx    ??? Fibromyalgia Neg Hx    ??? Gout Neg Hx    ??? Hemophilia Neg Hx    ??? Osteoporosis Neg Hx    ??? Rheumatologic disease Neg Hx    ??? Scoliosis Neg Hx    ??? Severe sprains Neg Hx    ??? Sickle cell anemia Neg Hx    ??? Spinal Compression Fracture Neg Hx    ??? Melanoma Neg Hx    ??? Basal cell carcinoma Neg Hx    ???  Squamous cell carcinoma Neg Hx        ROS:  A 10 system ROS was reviewed and was negative aside from that mentioned in the HPI.      PE:    Vitals:    03/08/21 0853   BP: 118/85   Pulse: 82   Temp: 36.6 ??C (97.8 ??F)       General: WD, WN well-appearing female in NAD.    Cardiovascular:  RRR, S1, S2.  No murmurs, gallops or rubs.      Lungs: CTAB.  Respirations even, nonlabored.  No wheezes, crackles, rhonchi.    Skin:  No rashes, lesions, jaundice, skin breakdown.     Musculoskeletal:  No clubbing, cyanosis or edema.  Left hand grip decreased.      Neurological: Alert and oriented x 4.  CN II- XII grossly intact.  Steady gait. Sensation intact.     Psych/Mental Health:  Appropriate affect.    ??  Imaging:  03/08/21 LUE arterial duplex:  These images were reviewed by Dr. Coralee Rud  Final Interpretation     Left: Patent subclavian to brachial artery BPG - Short segment        occlusion of the mid too distal brachial artery - Dampened        flow in the in the radial artery - Dampened flow in the        proximal to mid ulnar artery but unable to detect flow at the        wrist - Decreased amplitude PPG tracings from fingers one and        four.

## 2021-03-10 NOTE — Unmapped (Addendum)
Called patient and she stated she had a total hysterectomy and chemo for ovarian cancer, no radiation.  Replied to Dr. Matilde Haymaker with that information as well.    ----- Message from Doyce Loose, MD sent at 03/04/2021  9:58 AM EDT -----  Dorna Leitz, do you know (or can you find out) if she had surgery for her ovarian cancer?  ----- Message -----  From: Durenda Age, RN  Sent: 03/03/2021  10:18 AM EDT  To: Doyce Loose, MD    02/27/21 CT for targets and follow up.      BMI 29.86  Megan Robertson is a 50 y.o. 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 Patient states she makes urine and voids multiple times per day, but volume has decreased over the past few months. She now gets dialysis via trialysis catheter in R internal jugular. Since her procedure in October 2021 for acute limb ischemia w/t AV access thrombosis, she has required a cane for ambulation, has been receiving PT. States she becomes dyspneic with exertion and it is difficult for her to climb 1 flight of stairs (quickly becomes SOB). Pt states since receiving her trialysis catheter, she is unable to tolerate iHD (has nausea, vomiting, fatigue, generalized malaise) and therefore prefers PD.  ??  Cause of kidney disease: FSGS  On dialysis?: Yes, TThS    Thanks!!  ----- Message -----  From: Interface, Rad Results In  Sent: 02/27/2021   9:51 AM EDT  To: Durenda Age, RN

## 2021-03-16 NOTE — Unmapped (Signed)
I reviewed this patient case and all documentation provided by the learner and was readily available for consultation during their interaction with the patient.  I agree with the assessment and plan listed below.    Megan Robertson    [facility] Specialty Pharmacist

## 2021-03-16 NOTE — Unmapped (Signed)
[facility] Clinical Assessment & Refill Coordination Note    Megan Robertson, DOB: April 29, 1971  Phone: [phone] (home)     All above HIPAA information was verified with patient.     Was a Nurse, learning disability used for this call? No    Specialty Medication(s):   General Specialty: Repatha     Current Outpatient Medications   Medication Sig Dispense Refill   ??? adalimumab (HUMIRA) 40 mg/0.8 mL subcutaneous pen kit Inject the contents of 2 pens (80mg ) under the skin once for initial dose, then inject 1 pen (40mg ) every 2 weeks. 6 each 3   ??? allopurinoL (ZYLOPRIM) 100 MG tablet Take 1 tablet (100 mg total) by mouth Every Tuesday, Thursday and Saturday. 36 tablet 3   ??? amLODIPine (NORVASC) 10 MG tablet Take 1 tablet (10 mg total) by mouth daily. 90 tablet 3   ??? apixaban (ELIQUIS) 5 mg Tab Take 5 mg by mouth daily.     ??? aspirin 81 MG chewable tablet Chew 1 tablet (81 mg total) daily. 36 tablet 0   ??? atorvastatin (LIPITOR) 80 MG tablet Take 1 tablet (80 mg total) by mouth daily. 30 tablet 0   ??? beclomethasone dipropionate (QVAR REDIHALER) 80 mcg/actuation inhaler 1 puff every morning, 2 puffs every evening (Patient taking differently: continuous as needed. 1 puff every morning, 2 puffs every evening as needed) 10.6 g 0   ??? blood glucose control, normal (ACCU-CHEK SMARTVIEW CONTRL SOL) Soln 1 Bottle by Miscellaneous route once as needed. for up to 1 dose Use with each new box of test strips and as instructed 1 each 2   ??? blood sugar diagnostic (ACCU-CHEK SMARTVIEW TEST STRIP) Strp by Other route every morning before breakfast. For use with glucometer to check blood sugar daily 50 strip 5   ??? blood-glucose meter (ACCU-CHEK NANO) Misc 1 kit by Miscellaneous route every morning before breakfast. To check glucose level daily 1 each 0   ??? clobetasoL (TEMOVATE) 0.05 % ointment Place ointment on outside of vulva nightly for 2 weeks and then twice a week thereafter. 45 g 0   ??? evolocumab (REPATHA SURECLICK) 140 mg/mL PnIj Inject the contents of 1 pen (140 mg) under the skin every fourteen (14) days. 6 mL 3   ??? furosemide (LASIX) 40 MG tablet Take 1 tablet (40 mg total) by mouth once as needed. To be taken once a day if you develop leg swelling. 60 tablet 11   ??? gabapentin (NEURONTIN) 100 MG capsule Take 1 capsule (100 mg total) by mouth Three (3) times a day. 90 capsule 0   ??? isosorbide mononitrate (IMDUR) 120 MG 24 hr tablet Take 1 tablet (120 mg total) by mouth daily. 30 tablet 0   ??? lancets (ACCU-CHEK FASTCLIX LANCET DRUM) Misc 1 Units by Miscellaneous route every morning before breakfast. 102 each 5   ??? lancing device with lancets (ACCU-CHEK FASTCLIX LANCING DEV) Kit 1 kit by Miscellaneous route every morning before breakfast. With glucometer/lancets to check blood sugar daily 1 each 1   ??? oxybutynin (DITROPAN) 5 MG tablet Take 1 tablet (5 mg total) by mouth Two (2) times a day. 30 tablet 2   ??? triamcinolone (KENALOG) 0.1 % ointment APPLY  TOPICALLY TWICE DAILY TO  AFFECTED  AREAS (Patient not taking: Reported on 01/27/2021) 454 g 0     No current facility-administered medications for this visit.        Changes to medications: Megan Robertson reports no changes at this time.  Allergies   Allergen Reactions   ??? Cephalexin Itching     unknown; tolerates cefepime   ??? Mango Itching   ??? Peach (Prunus Persica) Itching   ??? Wellbutrin [Bupropion Hcl] Other (See Comments)     Seizure   ??? Egg Itching   ??? Fish Containing Products Itching   ??? Lactase-Rennet Nausea And Vomiting   ??? Lexiscan [Regadenoson] Nausea And Vomiting     Patient had nausea after the Lexi then vomitted [id number]   ??? Mushroom Itching   ??? Tomato (Solanum Lycopersicum) Itching       Changes to allergies: No    SPECIALTY MEDICATION ADHERENCE     Repatha 140 mg/ml: 18 days of medicine on hand     Medication Adherence    Patient reported X missed doses in the last month: 0  Specialty Medication: Repatha 140mg /ml  Patient is on more than two specialty medications: No  Any gaps in refill history greater than 2 weeks in the last 3 months: no  Demonstrates understanding of importance of adherence: yes  Informant: patient          Specialty medication(s) dose(s) confirmed: Regimen is correct and unchanged.     Are there any concerns with adherence? No    Adherence counseling provided? Not needed    CLINICAL MANAGEMENT AND INTERVENTION      Clinical Benefit Assessment:    Do you feel the medicine is effective or helping your condition? Yes    Clinical Benefit counseling provided? Not needed    Adverse Effects Assessment:    Are you experiencing any side effects? No    Are you experiencing difficulty administering your medicine? No    Quality of Life Assessment:    How many days over the past month did your dyslipidemia  keep you from your normal activities? For example, brushing your teeth or getting up in the morning. Patient declined to answer    Have you discussed this with your provider? Not needed    Acute Infection Status:    Acute infections noted within Epic:  No active infections  Patient reported infection: None    Therapy Appropriateness:    Is therapy appropriate? Yes, therapy is appropriate and should be continued    DISEASE/MEDICATION-SPECIFIC INFORMATION      For patients on injectable medications: Patient currently has 1 doses left.  Next injection is scheduled for 03/20/21.    PATIENT SPECIFIC NEEDS     - Does the patient have any physical, cognitive, or cultural barriers? No    - Is the patient high risk? No    - Does the patient require a Care Management Plan? No     - Does the patient require physician intervention or other additional services (i.e. nutrition, smoking cessation, social work)? No      SHIPPING     Specialty Medication(s) to be Shipped:   General Specialty: Repatha    Other medication(s) to be shipped: No additional medications requested for fill at this time     Changes to insurance: No    Delivery Scheduled: Yes, Expected medication delivery date: 03/31/2021.     Medication will be delivered via Same Day Courier to the confirmed prescription address in [facility].    The patient will receive a drug information handout for each medication shipped and additional FDA Medication Guides as required.  Verified that patient has previously received a Conservation officer, historic buildings and a Surveyor, mining.    The patient or caregiver  noted above participated in the development of this care plan and knows that they can request review of or adjustments to the care plan at any time.      All of the patient's questions and concerns have been addressed.    Dorena Bodo   [facility] Specialty Pharmacist

## 2021-03-21 ENCOUNTER — Ambulatory Visit: Admit: 2021-03-21 | Discharge: 2021-03-22 | Payer: MEDICARE

## 2021-03-31 NOTE — Unmapped (Signed)
Megan Robertson 's Repatha [employer] shipment will be delayed as a result of patient's Medicaid shows part D enrollment, but patient doesn't have this info.     I have reached out to the patient  at [phone] and communicated the delay. We will wait for a call back from the patient to reschedule the delivery.  We have not confirmed the new delivery date.      Patient has to contact her case worker w/ social services and have them correct the information. She is aware of this.

## 2021-04-05 MED ORDER — FUROSEMIDE 40 MG TABLET
ORAL_TABLET | ORAL | 3 refills | 84.00000 days | Status: CP
Start: 2021-04-05 — End: 2022-03-31

## 2021-04-13 NOTE — Unmapped (Signed)
Called [employer] to obtain processing information. Repatha is scheduled to deliver on 06/24 same day courier.

## 2021-04-14 MED FILL — REPATHA SURECLICK 140 MG/ML SUBCUTANEOUS PEN INJECTOR: SUBCUTANEOUS | 28 days supply | Qty: 2 | Fill #1

## 2021-04-20 ENCOUNTER — Ambulatory Visit: Admit: 2021-04-20 | Discharge: 2021-04-21 | Payer: MEDICARE

## 2021-04-20 DIAGNOSIS — R29898 Other symptoms and signs involving the musculoskeletal system: Principal | ICD-10-CM

## 2021-04-20 DIAGNOSIS — I70208 Unspecified atherosclerosis of native arteries of extremities, other extremity: Principal | ICD-10-CM

## 2021-04-20 DIAGNOSIS — Z992 Dependence on renal dialysis: Principal | ICD-10-CM

## 2021-04-20 DIAGNOSIS — N186 End stage renal disease: Principal | ICD-10-CM

## 2021-04-20 NOTE — Unmapped (Signed)
For colonoscopy:  STOP Eliquis (apixaban) 4 day(s) prior to the procedure and resume 1 day(s) after the procedure.

## 2021-04-20 NOTE — Unmapped (Signed)
[facility]      ASSESSMENT/PLAN:    1. Stenosis of left brachial artery (CMS-HCC)  2. 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 Left arm weakness  Here today to have form filled out with ICD-10 codes for conditions (listed above) that would help get her home health services. Failed LUE fistula with recurrent thrombosis and multiple stents in LUE, now leaving her L arm weaker than her right. 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  - AMB REFERRAL TO OCCUPATIONAL THERAPY EVAL AND TREAT; Future    4. Colon cancer screening: she will stop her Xarelto 4 days prior to the procedure  - Colonoscopy; Future    5. Encounter for screening mammogram for malignant neoplasm of breast  - Mammo Digital Screening Bilateral; Future      Follow up 3 months    SUBJECTIVE:  HPI: Megan Robertson is a 50 y.o. female who presents to clinic today for the following issues:  Chief Complaint   Patient presents with   ??? 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  Husband of 5 years, Megan Robertson. Both were widowed.  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 Found polyps. Thinks she is on 5 year interval but unsure.    Pneumococcal: PPSV23 in 2013, will need PCV20 at age 54  Shingles: declined  COVID: declined    HIV: negative  HCV: negative  Pap: sees ob/gyn in July    EtOH: none  Tobacco: former. Quit in 2011, smoked for about a year or two  Drugs: none      OBJECTIVE:  BP 117/81 (BP Site: R Arm, BP Position: Sitting, BP Cuff Size: Large)  - Pulse 84  - Temp 36.6 ??C (Temporal)  - Ht 157.5 cm (5' 2.01)  - Wt 81.4 kg (179 lb 6.4 oz)  - LMP  (LMP Unknown)  - BMI 32.80 kg/m??     Physical Exam  Constitutional:       General: She is not in acute distress.  Cardiovascular:      Rate and Rhythm: Normal rate and regular rhythm.      Comments: No palpable thrill over L upper arm  Pulmonary:      Effort: Pulmonary effort is normal.      Breath sounds: Normal breath sounds.   Abdominal:      Comments: No abdominal tenderness; PD site without redness or tenderness   Musculoskeletal:      Comments: 4/5 grip strength on L, 5/5 on R   Skin:     Comments: Well-healed surgical site over L upper arm   Psychiatric:      Comments: Reserved, limited eye  contact           Tollie Eth, MD   [facility], PGY1      [facility]  [address], [place], [place] [address]   ??? Telephone [phone] ??? Fax [phone]  [email or web address]

## 2021-04-26 MED ORDER — PEG 3350-ELECTROLYTES 236 GRAM-22.74 GRAM-6.74 GRAM-5.86 GRAM SOLUTION
Freq: Once | ORAL | 0 refills | 1.00000 days | Status: CP
Start: 2021-04-26 — End: 2021-04-26

## 2021-04-26 NOTE — Unmapped (Signed)
Colonoscopy  Procedure #1      0  Procedure #2      [id number]  MRN      generic??  Endoscopist      FALSE  Urgent procedure      FALSE  Do you take: Plavix, Coumadin, Lovenox, Pradaxa, Effient, Xarelto, Eliquis, Pletal, or Brilinta?      FALSE  Do you have hemophilia, von Willebrand disease, thrombocytopenia?      FALSE  Do you have a pacemaker or implanted cardiac defibrillator?      FALSE  Are you pregnant?      FALSE  Has a [facility] provider specified the location(s)?        Which location(s) did the [facility] provider specify?      FALSE  ???? [facility]      FALSE  ???? [place]      FALSE  ???? [facility]      FALSE  ???? [facility]      FALSE  Is procedure indication for variceal banding?      TRUE  Do you have sleep apnea or wear a CPAP machine at night?      5  Height (feet)      2  Height (inches)      179  Weight (pounds)      32.7  BMI              TRUE  Do you have chronic kidney disease?      FALSE  Do you have chronic constipation or have you had poor quality bowel preps for past colonoscopies?      FALSE  Do you have Crohn's disease or ulcerative colitis?      FALSE  Have you had weight loss surgery?              TRUE  When you walk around your house or grocery store, do you have to stop and rest due to shortness of breath, chest pain, or light-headedness?      FALSE  Are you in the process of scheduling a heart ultrasound, stress test, or catheterization for a new problem?      FALSE  Have you had a heart attack, stroke or heart stent placement within the past 6 months?      FALSE  Do you ever use supplemental oxygen?      FALSE  Have you been hospitalized for cirrhosis of the liver or heart failure in the last 12 months?      FALSE  Have you been treated for mouth or throat cancer with radiation or surgery?      FALSE  Have you been told that it is difficult for doctors to insert a breathing tube in you during anesthesia?      FALSE  Have you had a heart or lung transplant?              TRUE  Are you on dialysis?      FALSE  Do you have cirrhosis of the liver?      FALSE  Do you have myasthenia gravis?      FALSE  Is the patient a prisoner?              FALSE  Are you younger than 30?      FALSE  Have you previously received propofol sedation administered by an anesthesiologist for a GI procedure?      FALSE  Do you drink an  average of more than 3 drinks of alcohol per day?      FALSE  Do you regularly take prescription medications for chronic pain?      FALSE  Do you regularly take Ativan, Klonopin, Xanax, Valium, lorazepam, clonazepam, alprazolam, or diazepam?      FALSE  Have you previously had difficulty with sedation during a GI procedure?      FALSE  Have you been diagnosed with PTSD?      FALSE  Are you allergic to fentanyl or midazolam (Versed)?      FALSE  Do you take medications for HIV?      ************************ ************************ ************************ ************************   MRN:????????????????  [id number]      Anticoag Review: No      Nurse Triage:  No      GI Clinic Consult: No      Procedure(s):  Colonoscopy 0     [facility]      Endoscopist:  generic??      Urgent:????????????????????  No ??     Prep:??????????????????????????  Nulytely Prep      ************************ ************************ ************************ ************************

## 2021-04-27 NOTE — Unmapped (Signed)
Addended by: Bethel Born E on: 04/26/2021 07:10 PM     Modules accepted: Level of Service

## 2021-04-28 ENCOUNTER — Ambulatory Visit: Admit: 2021-04-28 | Discharge: 2021-05-27 | Payer: MEDICARE

## 2021-04-28 ENCOUNTER — Ambulatory Visit
Admit: 2021-04-28 | Discharge: 2021-05-27 | Payer: MEDICARE | Attending: Rehabilitative and Restorative Service Providers" | Primary: Rehabilitative and Restorative Service Providers"

## 2021-04-28 NOTE — Unmapped (Signed)
OUTPATIENT OCCUPATIONAL THERAPY   Note Type: Evaluation       Patient Name: Megan Robertson  Date of Birth:01-28-1971  Date of Visit: 04/28/2021  Visit #:  1/10 towards re-assessment, 1 total.  Date of Injury/Onset: Oct 2021  Date of Evaluation: 04/28/21  Referring Physician: Karl Ito  Reason for Referral: Eval and Treat  Plan of Care: 04/28/21-07/29/21  Encounter Diagnoses   Name Primary?   ??? Left arm weakness Yes   ??? Stenosis of left brachial artery (CMS-HCC)    ??? 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 year old R handed female with history of ESRD with several attempts of fistula/graft placement in LUE which did not take throughout 2021, who presents with LUE weakness, numbness, and pain, impairing participation in ADLs and IADLs.  Patient's biggest challenges are with active grasp of LUE, bimanual tasks such as donning pants/shoes/socks and household chores of washing dishes/doing laundry.  She is currently wearing a store-bought wrist cockup splint which seems to be helping somewhat with pain management in wrist.  Patient additionally has sharp pains with LUE movement and any resistance added to LUE.  Would benefit from strengthening and stretching exercises and positioning management for pain relief and to maximize her overall LUE functional use.  Patient requires skilled outpatient Occupational Therapy services to address the above deficits and maximize independence with ADLs and IADLs in home environment.    Patient wore a mask for the entire therapy session., Patient's companion wore a mask for the entire therapy session. and Therapist wore a mask for the entire session.       Complexity:  Moderate Complexity Eval Code: Clinical decision making was of moderate complexity, as data from the client???s history, profile, detailed assessments, and consideration of several treatment options.  Patient presents with comorbidities that affect occupational performance.  Minimal to moderate modification of tasks is necessary.        Short Term Goals   1. In 7 sessions, patient will perform home exercise program with need for cuing to max IND with ADLs and IADLs.  2. In 7 sessions, patient will demonstrate improved AROM and tolerance to LUE ER to be able to wash her hair bimanually with no greater than 5/10 pain  3. In 7 sessions, patient will demonstrate ability to don her socks with BUE  4. In 7 sessions, patient will demonstrate improved LUE grasping to hold silverware for cutting (either fork stabilized in LUE or knife to cut)    Long Term Goals:   1. In 13 sessions, patient will perform upgraded home exercise program independently  to max IND with ADLs and IADLs.  2. In 13 sessions, patient will demonstrate improved LUE grip to 5lbs or greater to max functional use of arm  3. In 13 sessions, patient will demonstrate improved LUE coordination on 9hpt to complete in 40s or less  4. In 13 sessions, patient will demonstrate overall reduced LUE pain to 3/10 with movement & functional use  5. In 13 sessions, patient will demonstrate ability to utilize BUE to open 3 different types of containers (medicines, drink bottles, food storage, etc) to max ind    Patient in agreement with plan of care?: yes    Prognosis for goal achievement: Good due to good motivation, supportive family and multiple co-morbidities.    PLAN  Pt. will participate in:  Therapeutic Exercise  Therapeutic Acitivity  Manual Therapy  Neuromuscular Re-education  Orthotic management  Self-care home training    Next session: create HEP for shoulder, determine if there are pain reduction strategies that can help with shooting paints with movement (scap/shoulder stabilizing?)    Planned frequency and duration of treatment:  2x/week for 6 weeks.  Plan will be adjusted as needed.     Past Medical History:   Past Medical History:   Diagnosis Date   ??? Abnormal mammogram    ??? Anxiety    ??? Arthritis    ??? Asthma    ??? Back pain    ??? Depression    ??? ESRD (end stage renal disease) on dialysis (CMS-HCC)    ??? FSGS (focal segmental glomerulosclerosis) 1997    renal biopsy at [facility]   ??? GERD (gastroesophageal reflux disease)    ??? Gout    ??? H/O adenoidectomy     had adenoids removed   ??? Hypercholesteremia    ??? Hypertension    ??? Pancreatitis    ??? Seizure (CMS-HCC)     unknown etiology; none for several years       Past Surgical History:   Past Surgical History:   Procedure Laterality Date   ??? APPENDECTOMY     ??? BREAST EXCISIONAL BIOPSY Right 11/28/2016   ??? CHG US GUIDE, VASCULAR ACCESS N/A 01/29/2020    Procedure: ULTRASOUND GUIDANCE FOR VASC ACCESS REQUIRING [place] EVAL OF POTENTIAL ACCESS SITES;  Surgeon: Leona Carry, MD;  Location: MAIN OR [facility];  Service: Transplant   ??? CHG US GUIDE, VASCULAR ACCESS N/A 06/08/2020    Procedure: ULTRASOUND GUIDANCE FOR VASC ACCESS REQUIRING [place] EVAL OF POTENTIAL ACCESS SITES;  Surgeon: Leona Carry, MD;  Location: MAIN OR [facility];  Service: Transplant   ??? CHOLECYSTECTOMY     ??? COMBINED HYSTEROSCOPY DIAGNOSTIC / D&C  07/07/2015    Planned for endometrial ablation, but patient had uterine perforation after D&C and unable to perform   ??? LEFT OOPHORECTOMY Left 04/12/2000    Removed with L fallopian tube for ectopic pregnancy   ??? PR CREAT AV FISTULA,AUTOGENOUS GRAFT Left 01/29/2020    Procedure: Create Av Fistula (Separt Proc); Verner Mould;  Surgeon: Leona Carry, MD;  Location: MAIN OR [facility]  Service: Transplant   ??? PR CREAT AV FISTULA,NON-AUTOGENOUS GRAFT Left 06/08/2020    Procedure: CREATE AV FISTULA (SEPARATE PROC); NONAUTOGENOUS GRAFT (EG, BIOLOGICAL COLLAGEN, THERMOPLASTIC GRAFT);  Surgeon: Leona Carry, MD;  Location: MAIN OR [facility];  Service: Transplant   ??? PR EXPLORATION N/FLWD SURG UPPER EXTREMITY ARTERY Left 08/02/2020    Procedure: Exploration Not Followed By Surgical Repair, Artery; Upper Extremity (Eg, Axillary, Brachial, Radial, Ulnar);  Surgeon: Earney Mallet, MD; Location: MAIN OR [facility];  Service: Vascular   ??? PR INCIS/DRAIN ARM,DEEP ABSC/HEMATOMA Left 08/14/2020    Procedure: INCISION AND DRAINAGE, UPPER ARM OR ELBOW AREA; DEEP ABSCESS OR HEMATOMA;  Surgeon: Earney Mallet, MD;  Location: MAIN OR [facility];  Service: Vascular   ??? PR INSERTION TUNNEL INTRAPERITONEAL CATH DIAL OPEN Midline 02/01/2021    Procedure: INSERTION OF INTRAPERITONEAL CANNULA OR CATHETER FOR DRAINAGE OR DIALYSIS; PERMANENT;  Surgeon: Loney Hering, MD;  Location: MAIN OR [facility];  Service: Transplant   ??? PR REMV ART CLOT AXILL-BRACH,ARM INCIS Left 08/02/2020    Procedure: Embolect/Thrombec; Axilry/Brachial Art-Arm Incs;  Surgeon: Earney Mallet, MD;  Location: MAIN OR [facility];  Service: Vascular   ??? PR UPPER GI ENDOSCOPY,BIOPSY N/A 08/29/2017    Procedure: UGI ENDOSCOPY; WITH BIOPSY, SINGLE OR MULTIPLE;  Surgeon:  Neysa Hotter, MD;  Location: GI PROCEDURES [facility] [facility];  Service: Gastroenterology   ??? PR VEIN BYPASS GRAFT,SUBCL-BRACHIAL Left 08/05/2020    Procedure: Bypass Graft, With Vein; Subclavian-Brachial;  Surgeon: Earney Mallet, MD;  Location: MAIN OR [facility];  Service: Vascular   ??? SALPINGECTOMY Left 04/12/2000    Ectopic pregnancy   ??? SALPINGECTOMY Right 11/22/2015    at time of total vaginal hysterectomy   ??? TONSILECTOMY, ADENOIDECTOMY, BILATERAL MYRINGOTOMY AND TUBES     ??? TOTAL VAGINAL HYSTERECTOMY  11/22/2015    With R salpingectomy       SUBJECTIVE:  Dialysis- graft put in arm but it didn't work, fistula didn't work, multiple times- has nerve damage from all of this      History of Present Condition: Per referring physician's note:  ASSESSMENT/PLAN:  1. Stenosis of left brachial artery (CMS-HCC)  2. 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 Left arm weakness  Here today to have form filled out with ICD-10 codes for conditions (listed above) that would help get her home health services. Failed LUE fistula with recurrent thrombosis and multiple stents in LUE, now leaving her L arm weaker than her right. 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    Assessment/Plan:    [name] is a 50 y.o. 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 ??She is one month s/p LUE angiogram with PTA of the distal anastomosis of the left subclavian to brachial bypass and PTA of the left brachial artery.  Duplex imaging demonstrates patent bypass however she has??occlusion of the mid to distal brachial artery, dampened flow in the in the radial artery and dampened flow in the proximal to mid ulnar artery.  Her symptoms have not worsened in severity since her last visit.  [name] was informed that we have no further intervention to offer her at this time.  We recommend she continue with medical management with eliquis.  We will follow-up with her in 6 months with repeat LUE arterial duplex.   ??  HPI: [name] is a 50 y.o. female with a history of??asthma, FSGS, GERD, gout, HLD, HTN and ESRD on dialysis. ??She underwent left??brachio-cephalic??vein AVF??(01/29/20) which thrombosed prior to maturation and underwent left brachiobasilic AVG??on 06/08/20 with Dr. Norma Fredrickson which also failed and was never used. ??Since the brachiobasilic AVG was created in August, she experienced left arm pain, numbness in the hand, and some weakness of the left upper extremity. These symptoms initially had some improvement with grip strengh exercises.????She presented to the hospital on 08/02/20 after having severe pain, cold fingertips, paresthesias and decreased LUE pulses with arterial duplex demonstrating occlusion of mid/distal brachial artery??and no flow in distal radial artery or ulnar artery.????On 10/12 she underwent surgical exposure of left brachial artery??and thromboembolectomy of left brachial artery and ulnar artery. ??On 10/15 she underwent left subclavian to brachial artery bypass using reversed GSV??with right lower extremity GSV harvest. ??She developed leukocytosis with CT imaging demonstrating a 3 cm collection with air in the upper arm near the vein graft, and on 10/24 she underwent LUE wound exploration and washout, excisional debridement  of subcutaneous tissue and wound vac placement. ??She finished a 14 day course of antibiotics on 11/6. ??She was last seen in clinic on??01/25/21 with duplex findings of patent bypass with possible obstruction distal to the bypass graft with dampened flow in the radial and ulnar arteries.  On 02/09/21 she underwent LUE angiogram with PTA of the distal anastomosis of the left subclavian to brachial bypass and PTA of the left brachial artery.  She presents today for one month follow-up.      [name] presents to the clinic today with family.   She reports the same symptoms as prior to her most recent intervention, including: arm pain that alternates throughout the entire arm to her fingers, paresthesias and decreased left hand grip.  She has done OT for her LUE.  She recently underwent PD catheter placement and is currently receiving education regarding PD.  She continues to take eliquis.     Precautions: on home dialysis    Prior Therapies: AIR PT/OT    Patient???s Goals: get functional use back of my hand    Pain  Pain present?: LUE, 7/10- worse with movement and resistance, hurts from scapula and shoulder all the way down arm     Social History:   Lives with: husband Lisbeth Ply   Stories: trailer- 4STE  Bathroom Layout: tub shower   Home Equipment: shower bench, cane, wrist brace (bought it herself for wrist drop)    PLOF: prior to October 2021 no issues with LUE and completely independent     OBJECTIVE  Current Level of Function  Feeding: cutting food is hard, feeds self with RUE  Grooming: RUE only  Toileting: sometimes harder to pull up pants due to using only one hand   Bathing: sitting to bathe, hard to wash bottom, washing self with RUE but slightly hard   Dressing UE: wearing sports bras, shirts are fine  Dressing LE: pulling up pants hard, hard to put socks on with 1 hand (trying to put on L thumb to help but struggling), can't put on shoes  Transfers (bed, chair, toilet, shower): independent  Bed Mobility: independent    Instrumental ADLs:  Medication management: opening containers is hard- rec weekly pill box to sort pills to avoid struggling to open   Health management: not exercising currently   Caregiver Availability/Willingness/Capability: husband Garment/textile technologist:   Vocational Pursuits: not working currently (hasn't worked since 1994)  Communication Management: writing and texting ok with R hand, not typing much   Cooking: no  Shopping: goes grocery and clothes shopping- hard to pick things up with left hand while shopping  Laundry: struggling to fold clothes   Cleaning: no dishwasher, has to wash by hand, will hold with L hand and wash with R but this is very hard   Pet Care: 1 dog (husky pitbull)  Yard work: no  Driving per Patient report: No  Safety: Patient does use good safety/judgement for household/daily tasks.     Sleep: hard to get comfortable, hand aches most in the night, also has sleep apnea, reports if she elevates her LUE with a pillow it helps, Tylenol helps sometimes     Recommended trying to wear her splint at night to see if this alleviates some of her pain      Strength Testing  Slightly less than full range of flexion in BUEs  Remainder of RUE AROM full  Did not MMT due to significant pain in LUE     Left  ROM Right ROM   Shoulder flex < full WFL    Shoulder abd < full WFL    Biceps  full WFL    Triceps Slightly less than full WFL External rot Painful, unable to fully do [facility]    Internal rot Painful, unable to fully do [facility]    Wrist ext Limited due to fistula on volar wrist WFL    Wrist flex Limited due to fistula on volar wrist WFL    Supination [facility] WFL    Pronation [facility] WFL      Sharp pain in entire LUE with movement  Impaired opposition in LUE reaching 4th and 5th digit        Grip Strength:  Grip Strength: A quantitative and objective measure of isometric muscular strength of the hand and forearm.  This instrument is scored using force production in pounds    Standardized procedure for positioning consists of the subject seated with back, pelvis, and knees as close to 90 degrees as possible, shoulder is adducted and neutrally rotated, elbow flexed at 90 degrees, forearm neutral, wrist held between 0-15 degrees of ulnar deviation. The arm is not supported by examiner or armrest and the dynamometer is presented vertically and in line with the forearm. Maximum grip is the mean of three trials.        L grip (pounds): 0  R grip (pounds): 35, 31, 82  Female -  age 33-54 R: 72-87 L: 35-76      Coordination  Nine Hole Peg Test RUE: 21s LUE 49s  Age 31-54 R 13-23 L 14-26        Edema:   Mild swelling in LUE       Sensation:  Light Touch: reports equal feeling of light touch, but left arm feels like sandpaper  Sharp/Dull:   Proprioception: impaired finger to nose   Stereognosis: impaired  Comments: regularly feels like she's being stuck with needles, shooting pains, numb, electricity      Vision and Visual Perception:   Wears glasses for distance and reading      Treatment Rendered  Eval Only    Total Evaluation time:  40 minutes    Total Treatment Time:  40 minutes    Education:  Topics:Disease Glass blower/designer Exercise  Splinting  Home Medical illustrator  Education Provided to: patient and spouse  Education Type:Demonstration and Explanation  Response to education/teachback:Verbal understanding recieved    I reviewed the no-show/attendance policy with the patient and caregiver(s). The family is aware that they must call to cancel appointments more than 24 hours in advance. They are also aware that if they late cancel or no-show three times, we reserve the right to cancel their remaining appointments. This policy is in place to allow [place] to best serve the needs of our caseload.       I attest that I have reviewed the above information.  Signed: Earlie Counts, OT 04/28/2021 2:03 PM

## 2021-05-02 ENCOUNTER — Ambulatory Visit
Admit: 2021-05-02 | Discharge: 2021-05-03 | Payer: MEDICARE | Attending: Obstetrics & Gynecology | Primary: Obstetrics & Gynecology

## 2021-05-02 DIAGNOSIS — Z0000 Encounter for general adult medical examination without abnormal findings: Principal | ICD-10-CM

## 2021-05-02 DIAGNOSIS — Z01818 Encounter for other preprocedural examination: Principal | ICD-10-CM

## 2021-05-02 DIAGNOSIS — Z124 Encounter for screening for malignant neoplasm of cervix: Principal | ICD-10-CM

## 2021-05-02 DIAGNOSIS — N186 End stage renal disease: Principal | ICD-10-CM

## 2021-05-02 NOTE — Unmapped (Signed)
Assessment/Plan:     Pre-Transplant evaluation    -- Vaginal pap done today; patient low-risk for HPV; has a history of HPV + paps and CIN 1; however was unclear from [facility] what her last pap was, so this was done to be sure that there is no underlying condition.  She is s/p total vaginal hysterectomy, and does not have a cervix.  Will not need further pap testing if this pap is normal.   -- Importance of self-breast awareness discussed, breast exam done in-office. Patient encouraged to make appointment for mammogram which was ordered by a different provider.   -- Depression and DV screenings negative.     Diagnoses and all orders for this visit:    Pre-transplant evaluation for end stage renal disease  -     Ambulatory referral to Gynecology    ESRD (end stage renal disease) (CMS-HCC)  -     Ambulatory referral to Gynecology    Health care maintenance  -     Ambulatory referral to Gynecology    Pap smear for cervical cancer screening  -     Ambulatory referral to Gynecology    Other orders  -     gentamicin (GARAMYCIN) 0.1 % cream; APPLY A SMALL AMOUNT  TO EXIT SITE DAILY WITH DRESSING CHANGE  -     methoxy peg-epoetin beta (MIRCERA INJ); 75 mcg.        Subjective     HPI    Megan Robertson is a 50 y.o. 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 She is s/p total vaginal hysterectomy in 2017; patient with history some abnormal paps; had documented CIN1 in 2001. And HR HPV in 2012 but not documented colposcopy/biopsies.    Here today for a pre-transplant evaluation for upcoming kidney transplant. She reports that she is doing well overall. She reports some cramping pain every few weeks. She also reports vulvar abscesses requiring drainage every few months, but she has not had issues with those in several months. She is here to get a pap smear in advance of a renal transplant.     A 10-point review of systems was negative except as per HPI.           Chief Complaint Gynecologic Exam            Past Medical History:   Diagnosis Date   ??? Abnormal mammogram    ??? Anxiety    ??? Arthritis    ??? Asthma    ??? Back pain    ??? Depression    ??? ESRD (end stage renal disease) on dialysis (CMS-HCC)    ??? FSGS (focal segmental glomerulosclerosis) 1997    renal biopsy at [facility]   ??? GERD (gastroesophageal reflux disease)    ??? Gout    ??? H/O adenoidectomy     had adenoids removed   ??? Hypercholesteremia    ??? Hypertension    ??? Pancreatitis    ??? Seizure (CMS-HCC)     unknown etiology; none for several years     Past Surgical History:   Procedure Laterality Date   ??? APPENDECTOMY     ??? BREAST EXCISIONAL BIOPSY Right 11/28/2016   ??? CHG US GUIDE, VASCULAR ACCESS N/A 01/29/2020    Procedure: ULTRASOUND GUIDANCE FOR VASC ACCESS REQUIRING [place] EVAL OF POTENTIAL ACCESS SITES;  Surgeon: Leona Carry, MD;  Location: MAIN OR [facility];  Service: Transplant   ??? CHG US GUIDE, VASCULAR ACCESS N/A 06/08/2020  Procedure: ULTRASOUND GUIDANCE FOR VASC ACCESS REQUIRING [place] EVAL OF POTENTIAL ACCESS SITES;  Surgeon: Leona Carry, MD;  Location: MAIN OR [facility];  Service: Transplant   ??? CHOLECYSTECTOMY     ??? COMBINED HYSTEROSCOPY DIAGNOSTIC / D&C  07/07/2015    Planned for endometrial ablation, but patient had uterine perforation after D&C and unable to perform   ??? LEFT OOPHORECTOMY Left 04/12/2000    Removed with L fallopian tube for ectopic pregnancy   ??? PR CREAT AV FISTULA,AUTOGENOUS GRAFT Left 01/29/2020    Procedure: Create Av Fistula (Separt Proc); Verner Mould;  Surgeon: Leona Carry, MD;  Location: MAIN OR [facility];  Service: Transplant   ??? PR CREAT AV FISTULA,NON-AUTOGENOUS GRAFT Left 06/08/2020    Procedure: CREATE AV FISTULA (SEPARATE PROC); NONAUTOGENOUS GRAFT (EG, BIOLOGICAL COLLAGEN, THERMOPLASTIC GRAFT);  Surgeon: Leona Carry, MD;  Location: MAIN OR [facility];  Service: Transplant   ??? PR EXPLORATION N/FLWD SURG UPPER EXTREMITY ARTERY Left 08/02/2020    Procedure: Exploration Not Followed By Surgical Repair, Artery; Upper Extremity (Eg, Axillary, Brachial, Radial, Ulnar);  Surgeon: Earney Mallet, MD;  Location: MAIN OR [facility];  Service: Vascular   ??? PR INCIS/DRAIN ARM,DEEP ABSC/HEMATOMA Left 08/14/2020    Procedure: INCISION AND DRAINAGE, UPPER ARM OR ELBOW AREA; DEEP ABSCESS OR HEMATOMA;  Surgeon: Earney Mallet, MD;  Location: MAIN OR [facility];  Service: Vascular   ??? PR INSERTION TUNNEL INTRAPERITONEAL CATH DIAL OPEN Midline 02/01/2021    Procedure: INSERTION OF INTRAPERITONEAL CANNULA OR CATHETER FOR DRAINAGE OR DIALYSIS; PERMANENT;  Surgeon: Loney Hering, MD;  Location: MAIN OR [facility];  Service: Transplant   ??? PR REMV ART CLOT AXILL-BRACH,ARM INCIS Left 08/02/2020    Procedure: Embolect/Thrombec; Axilry/Brachial Art-Arm Incs;  Surgeon: Earney Mallet, MD;  Location: MAIN OR [facility];  Service: Vascular   ??? PR UPPER GI ENDOSCOPY,BIOPSY N/A 08/29/2017    Procedure: UGI ENDOSCOPY; WITH BIOPSY, SINGLE OR MULTIPLE;  Surgeon: Neysa Hotter, MD;  Location: GI PROCEDURES [facility] [facility];  Service: Gastroenterology   ??? PR VEIN BYPASS GRAFT,SUBCL-BRACHIAL Left 08/05/2020    Procedure: Bypass Graft, With Vein; Subclavian-Brachial;  Surgeon: Earney Mallet, MD;  Location: MAIN OR [facility];  Service: Vascular   ??? SALPINGECTOMY Left 04/12/2000    Ectopic pregnancy   ??? SALPINGECTOMY Right 11/22/2015    at time of total vaginal hysterectomy   ??? TONSILECTOMY, ADENOIDECTOMY, BILATERAL MYRINGOTOMY AND TUBES     ??? TOTAL VAGINAL HYSTERECTOMY  11/22/2015    With R salpingectomy       OB History     Gravida   6    Para   2    Term   2    Preterm        AB   4    Living   2       SAB   3    IAB        Ectopic   1    Molar        Multiple        Live Births   2                PGYN history:  No abnormal paps; no STIs, No fibroids/cysts           Social History     Socioeconomic History   ??? Marital status: Married   Tobacco Use   ??? Smoking status: Former Smoker     Types: Cigarettes     Quit  date: 06/30/2010     Years since quitting: 10.8   ??? Smokeless tobacco: Never Used   Vaping Use   ??? Vaping Use: Never used   Substance and Sexual Activity   ??? Alcohol use: No   ??? Drug use: No   ??? Sexual activity: Yes     Partners: Male     Birth control/protection: None   Other Topics Concern   ??? Do you use sunscreen? No   ??? Tanning bed use? No   ??? Are you easily burned? No   ??? Excessive sun exposure? No   ??? Blistering sunburns? No   Social History Narrative    on disability since 1994, but does not qualify anymore since she got married and her husband has income.     Lives in [place] with husband, Lisbeth Ply and 2 children     Family History   Adopted: Yes   Problem Relation Age of Onset   ??? Heart attack Mother 64   ??? No Known Problems Father    ??? No Known Problems Sister    ??? No Known Problems Son    ??? No Known Problems Daughter    ??? No Known Problems Brother    ??? No Known Problems Maternal Aunt    ??? No Known Problems Maternal Uncle    ??? No Known Problems Paternal Aunt    ??? No Known Problems Paternal Uncle    ??? No Known Problems Maternal Grandmother    ??? No Known Problems Maternal Grandfather    ??? No Known Problems Paternal Grandmother    ??? No Known Problems Paternal Grandfather    ??? No Known Problems Other    ??? Anesthesia problems Neg Hx    ??? Broken bones Neg Hx    ??? Cancer Neg Hx    ??? Clotting disorder Neg Hx    ??? Collagen disease Neg Hx    ??? Diabetes Neg Hx    ??? Dislocations Neg Hx    ??? Fibromyalgia Neg Hx    ??? Gout Neg Hx    ??? Hemophilia Neg Hx    ??? Osteoporosis Neg Hx    ??? Rheumatologic disease Neg Hx    ??? Scoliosis Neg Hx    ??? Severe sprains Neg Hx    ??? Sickle cell anemia Neg Hx    ??? Spinal Compression Fracture Neg Hx    ??? Melanoma Neg Hx    ??? Basal cell carcinoma Neg Hx    ??? Squamous cell carcinoma Neg Hx          Meds- see Epic  All- see Epic    A 10-point review of systems was negative except as per HPI.       Objective   BP 115/75  - Pulse 101  - Wt 81.3 kg (179 lb 3.2 oz)  - LMP  (LMP Unknown)  - BMI 32.77 kg/m??     Gen: well-appearing, well-developed, well-nourished female in NAD  HEENT: no thyromegaly  CVS:  RRR, S1, S2  Breast: equal and symmetric bilaterally. No masses/nipple retraction/discharge.  No axillary LAD  Abd: soft, obese, nontender, nondistended. Peritoneal catheter in place  Pelvic: nefg,slightly atrophic vaginal mucosa, No cervix, s/p TVH. No discharge. No bleeding.Cuff intact. No adnexal tenderness/masses.   Ext: no calf tenderness, no edema, normal ROM; multiple well-healed scars on legs and arms from fistula placement    Filomena Jungling, MS3

## 2021-05-02 NOTE — Unmapped (Addendum)
Assessment/Plan:     Pre-Transplant evaluation    -- Vaginal pap done today; patient low-risk for HPV; has a history of HPV + paps and CIN 1; however was unclear from [facility] what her last pap was, so this was done to be sure that there is no underlying condition.  She is s/p total vaginal hysterectomy, and does not have a cervix.  Will not need further pap testing if this pap is normal.   -- Importance of self-breast awareness discussed, breast exam done in-office. Patient encouraged to make appointment for mammogram which was ordered by a different provider.   -- Depression and DV screenings negative.     Diagnoses and all orders for this visit:    Pre-transplant evaluation for end stage renal disease  -     Ambulatory referral to Gynecology    ESRD (end stage renal disease) (CMS-HCC)  -     Ambulatory referral to Gynecology    Health care maintenance  -     Ambulatory referral to Gynecology    Pap smear for cervical cancer screening  -     Ambulatory referral to Gynecology    Other orders  -     gentamicin (GARAMYCIN) 0.1 % cream; APPLY A SMALL AMOUNT  TO EXIT SITE DAILY WITH DRESSING CHANGE  -     methoxy peg-epoetin beta (MIRCERA INJ); 75 mcg.        Subjective     HPI    Megan Robertson is a 50 y.o. 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 She is s/p total vaginal hysterectomy in 2017; patient with history some abnormal paps; had documented CIN1 in 2001. And HR HPV in 2012 but not documented colposcopy/biopsies.    Here today for a pre-transplant evaluation for upcoming kidney transplant. She reports that she is doing well overall. She reports some cramping pain every few weeks. She also reports vulvar abscesses requiring drainage every few months, but she has not had issues with those in several months. She is here to get a pap smear in advance of a renal transplant.     A 10-point review of systems was negative except as per HPI.           Chief Complaint Gynecologic Exam            Past Medical History:   Diagnosis Date   ??? Abnormal mammogram    ??? Anxiety    ??? Arthritis    ??? Asthma    ??? Back pain    ??? Depression    ??? ESRD (end stage renal disease) on dialysis (CMS-HCC)    ??? FSGS (focal segmental glomerulosclerosis) 1997    renal biopsy at [facility]   ??? GERD (gastroesophageal reflux disease)    ??? Gout    ??? H/O adenoidectomy     had adenoids removed   ??? Hypercholesteremia    ??? Hypertension    ??? Pancreatitis    ??? Seizure (CMS-HCC)     unknown etiology; none for several years     Past Surgical History:   Procedure Laterality Date   ??? APPENDECTOMY     ??? BREAST EXCISIONAL BIOPSY Right 11/28/2016   ??? CHG US GUIDE, VASCULAR ACCESS N/A 01/29/2020    Procedure: ULTRASOUND GUIDANCE FOR VASC ACCESS REQUIRING [place] EVAL OF POTENTIAL ACCESS SITES;  Surgeon: Leona Carry, MD;  Location: MAIN OR [facility];  Service: Transplant   ??? CHG US GUIDE, VASCULAR ACCESS N/A 06/08/2020  Procedure: ULTRASOUND GUIDANCE FOR VASC ACCESS REQUIRING [place] EVAL OF POTENTIAL ACCESS SITES;  Surgeon: Leona Carry, MD;  Location: MAIN OR [facility];  Service: Transplant   ??? CHOLECYSTECTOMY     ??? COMBINED HYSTEROSCOPY DIAGNOSTIC / D&C  07/07/2015    Planned for endometrial ablation, but patient had uterine perforation after D&C and unable to perform   ??? LEFT OOPHORECTOMY Left 04/12/2000    Removed with L fallopian tube for ectopic pregnancy   ??? PR CREAT AV FISTULA,AUTOGENOUS GRAFT Left 01/29/2020    Procedure: Create Av Fistula (Separt Proc); Verner Mould;  Surgeon: Leona Carry, MD;  Location: MAIN OR [facility];  Service: Transplant   ??? PR CREAT AV FISTULA,NON-AUTOGENOUS GRAFT Left 06/08/2020    Procedure: CREATE AV FISTULA (SEPARATE PROC); NONAUTOGENOUS GRAFT (EG, BIOLOGICAL COLLAGEN, THERMOPLASTIC GRAFT);  Surgeon: Leona Carry, MD;  Location: MAIN OR [facility];  Service: Transplant   ??? PR EXPLORATION N/FLWD SURG UPPER EXTREMITY ARTERY Left 08/02/2020    Procedure: Exploration Not Followed By Surgical Repair, Artery; Upper Extremity (Eg, Axillary, Brachial, Radial, Ulnar);  Surgeon: Earney Mallet, MD;  Location: MAIN OR [facility];  Service: Vascular   ??? PR INCIS/DRAIN ARM,DEEP ABSC/HEMATOMA Left 08/14/2020    Procedure: INCISION AND DRAINAGE, UPPER ARM OR ELBOW AREA; DEEP ABSCESS OR HEMATOMA;  Surgeon: Earney Mallet, MD;  Location: MAIN OR [facility];  Service: Vascular   ??? PR INSERTION TUNNEL INTRAPERITONEAL CATH DIAL OPEN Midline 02/01/2021    Procedure: INSERTION OF INTRAPERITONEAL CANNULA OR CATHETER FOR DRAINAGE OR DIALYSIS; PERMANENT;  Surgeon: Loney Hering, MD;  Location: MAIN OR [facility];  Service: Transplant   ??? PR REMV ART CLOT AXILL-BRACH,ARM INCIS Left 08/02/2020    Procedure: Embolect/Thrombec; Axilry/Brachial Art-Arm Incs;  Surgeon: Earney Mallet, MD;  Location: MAIN OR [facility];  Service: Vascular   ??? PR UPPER GI ENDOSCOPY,BIOPSY N/A 08/29/2017    Procedure: UGI ENDOSCOPY; WITH BIOPSY, SINGLE OR MULTIPLE;  Surgeon: Neysa Hotter, MD;  Location: [facility] PROCEDURES [place] [facility];  Service: Gastroenterology   ??? PR VEIN BYPASS GRAFT,SUBCL-BRACHIAL Left 08/05/2020    Procedure: Bypass Graft, With Vein; Subclavian-Brachial;  Surgeon: Earney Mallet, MD;  Location: MAIN OR [facility];  Service: Vascular   ??? SALPINGECTOMY Left 04/12/2000    Ectopic pregnancy   ??? SALPINGECTOMY Right 11/22/2015    at time of total vaginal hysterectomy   ??? TONSILECTOMY, ADENOIDECTOMY, BILATERAL MYRINGOTOMY AND TUBES     ??? TOTAL VAGINAL HYSTERECTOMY  11/22/2015    With R salpingectomy       OB History     Gravida   6    Para   2    Term   2    Preterm        AB   4    Living   2       SAB   3    IAB        Ectopic   1    Molar        Multiple        Live Births   2                PGYN history:  No abnormal paps; no STIs, No fibroids/cysts           Social History     Socioeconomic History   ??? Marital status: Married   Tobacco Use   ??? Smoking status: Former Smoker     Types: Cigarettes     Quit  date: 06/30/2010     Years since quitting: 10.8   ??? Smokeless tobacco: Never Used   Vaping Use   ??? Vaping Use: Never used   Substance and Sexual Activity   ??? Alcohol use: No   ??? Drug use: No   ??? Sexual activity: Yes     Partners: Male     Birth control/protection: None   Other Topics Concern   ??? Do you use sunscreen? No   ??? Tanning bed use? No   ??? Are you easily burned? No   ??? Excessive sun exposure? No   ??? Blistering sunburns? No   Social History Narrative    on disability since 1994, but does not qualify anymore since she got married and her husband has income.     Lives in [place] with husband, Lisbeth Ply and 2 children     Family History   Adopted: Yes   Problem Relation Age of Onset   ??? Heart attack Mother 64   ??? No Known Problems Father    ??? No Known Problems Sister    ??? No Known Problems Son    ??? No Known Problems Daughter    ??? No Known Problems Brother    ??? No Known Problems Maternal Aunt    ??? No Known Problems Maternal Uncle    ??? No Known Problems Paternal Aunt    ??? No Known Problems Paternal Uncle    ??? No Known Problems Maternal Grandmother    ??? No Known Problems Maternal Grandfather    ??? No Known Problems Paternal Grandmother    ??? No Known Problems Paternal Grandfather    ??? No Known Problems Other    ??? Anesthesia problems Neg Hx    ??? Broken bones Neg Hx    ??? Cancer Neg Hx    ??? Clotting disorder Neg Hx    ??? Collagen disease Neg Hx    ??? Diabetes Neg Hx    ??? Dislocations Neg Hx    ??? Fibromyalgia Neg Hx    ??? Gout Neg Hx    ??? Hemophilia Neg Hx    ??? Osteoporosis Neg Hx    ??? Rheumatologic disease Neg Hx    ??? Scoliosis Neg Hx    ??? Severe sprains Neg Hx    ??? Sickle cell anemia Neg Hx    ??? Spinal Compression Fracture Neg Hx    ??? Melanoma Neg Hx    ??? Basal cell carcinoma Neg Hx    ??? Squamous cell carcinoma Neg Hx          Meds- see Epic  All- see Epic    A 10-point review of systems was negative except as per HPI.       Objective   BP 115/75  - Pulse 101  - Wt 81.3 kg (179 lb 3.2 oz)  - LMP  (LMP Unknown)  - BMI 32.77 kg/m??     Gen: well-appearing, well-developed, well-nourished female in NAD  HEENT: no thyromegaly  CVS:  RRR, S1, S2  Breast: equal and symmetric bilaterally. No masses/nipple retraction/discharge.  No axillary LAD  Abd: soft, obese, nontender, nondistended. Peritoneal catheter in place  Pelvic: nefg,slightly atrophic vaginal mucosa, No cervix, s/p TVH. No discharge. No bleeding.Cuff intact. No adnexal tenderness/masses.   Ext: no calf tenderness, no edema, normal ROM; multiple well-healed scars on legs and arms from fistula placement    Megan Robertson, MS3

## 2021-05-03 NOTE — Unmapped (Signed)
Immediately after or during the visit, I reviewed with the resident the medical history and the resident???s findings on physical examination.  I discussed with the resident the patient???s diagnosis and concur with the treatment plan as documented in the resident note. Duanne Moron, MD

## 2021-05-15 NOTE — Unmapped (Signed)
OUTPATIENT OCCUPATIONAL THERAPY   Note Type: Treatment Note       Patient Name: Megan Robertson  Date of Birth:[birth year]  Date of Visit: 05/15/2021  Visit #:  2/10 towards re-assessment, 2 total.  Date of Injury/Onset: Oct 2021  Date of Evaluation: 04/28/21  Referring Physician: Karl Ito  Reason for Referral: Eval and Treat  Plan of Care: 04/28/21-07/29/21  Encounter Diagnoses   Name Primary?   ??? Left arm weakness Yes   ??? Stenosis of left brachial artery (CMS-HCC)    ??? 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 year old R handed female with history of ESRD with several attempts of fistula/graft placement in LUE which did not take throughout 2021, who presents with LUE weakness, numbness, and pain, impairing participation in ADLs and IADLs.  Patient's biggest challenges are with active grasp of LUE, bimanual tasks such as donning pants/shoes/socks and household chores of washing dishes/doing laundry.  She is currently wearing a store-bought wrist cockup splint which seems to be helping somewhat with pain management in wrist.  Patient additionally has sharp pains with LUE movement and any resistance added to LUE.  Would benefit from strengthening and stretching exercises and positioning management for pain relief and to maximize her overall LUE functional use. This session patient focused on range of motion from shoulder to wrist. Attention to scar management as patient presents with hypertrophic scarring and reports pulling sensation. Pt to benefit from scar massage daily and use of Scar Away strips nightly.   Patient requires skilled outpatient Occupational Therapy services to address the above deficits and maximize independence with ADLs and IADLs in home environment.    Patient wore a mask for the entire therapy session., Patient's companion wore a mask for the entire therapy session. and Therapist wore a mask for the entire session.       Complexity:  Moderate Complexity Eval Code: Clinical decision making was of moderate complexity, as data from the client???s history, profile, detailed assessments, and consideration of several treatment options.  Patient presents with comorbidities that affect occupational performance.  Minimal to moderate modification of tasks is necessary.        Short Term Goals   1. In 7 sessions, patient will perform home exercise program with need for cuing to max IND with ADLs and IADLs.  2. In 7 sessions, patient will demonstrate improved AROM and tolerance to LUE ER to be able to wash her hair bimanually with no greater than 5/10 pain  3. In 7 sessions, patient will demonstrate ability to don her socks with BUE  4. In 7 sessions, patient will demonstrate improved LUE grasping to hold silverware for cutting (either fork stabilized in LUE or knife to cut)    Long Term Goals:   1. In 13 sessions, patient will perform upgraded home exercise program independently  to max IND with ADLs and IADLs.  2. In 13 sessions, patient will demonstrate improved LUE grip to 5lbs or greater to max functional use of arm  3. In 13 sessions, patient will demonstrate improved LUE coordination on 9hpt to complete in 40s or less  4. In 13 sessions, patient will demonstrate overall reduced LUE pain to 3/10 with movement & functional use  5. In 13 sessions, patient will demonstrate ability to utilize BUE to open 3 different types of containers (medicines, drink bottles, food storage, etc) to max ind    Patient in agreement with plan of  care?: yes    Prognosis for goal achievement: Good due to good motivation, supportive family and multiple co-morbidities.    PLAN  Pt. will participate in:  Therapeutic Exercise  Therapeutic Acitivity  Manual Therapy  Neuromuscular Re-education  Orthotic management  Self-care home training    Next session: continue with UE ROM     Planned frequency and duration of treatment:  2x/week for 6 weeks.  Plan will be adjusted as needed.     Past Medical History: Past Medical History:   Diagnosis Date   ??? Abnormal mammogram    ??? Abnormal Pap smear of cervix    ??? Anxiety    ??? Arthritis    ??? Asthma    ??? Back pain    ??? Depression    ??? ESRD (end stage renal disease) on dialysis (CMS-HCC)    ??? FSGS (focal segmental glomerulosclerosis) 1997    renal biopsy at [facility]   ??? GERD (gastroesophageal reflux disease)    ??? Gout    ??? H/O adenoidectomy     had adenoids removed   ??? Hypercholesteremia    ??? Hypertension    ??? Pancreatitis    ??? Seizure (CMS-HCC)     unknown etiology; none for several years       Past Surgical History:   Past Surgical History:   Procedure Laterality Date   ??? APPENDECTOMY     ??? BREAST EXCISIONAL BIOPSY Right 11/28/2016   ??? CHG US GUIDE, VASCULAR ACCESS N/A 01/29/2020    Procedure: ULTRASOUND GUIDANCE FOR VASC ACCESS REQUIRING [place] EVAL OF POTENTIAL ACCESS SITES;  Surgeon: Leona Carry, MD;  Location: MAIN OR [facility];  Service: Transplant   ??? CHG US GUIDE, VASCULAR ACCESS N/A 06/08/2020    Procedure: ULTRASOUND GUIDANCE FOR VASC ACCESS REQUIRING [place] EVAL OF POTENTIAL ACCESS SITES;  Surgeon: Leona Carry, MD;  Location: MAIN OR [facility];  Service: Transplant   ??? CHOLECYSTECTOMY     ??? COMBINED HYSTEROSCOPY DIAGNOSTIC / D&C  07/07/2015    Planned for endometrial ablation, but patient had uterine perforation after D&C and unable to perform   ??? LEFT OOPHORECTOMY Left 04/12/2000    Removed with L fallopian tube for ectopic pregnancy   ??? PR CREAT AV FISTULA,AUTOGENOUS GRAFT Left 01/29/2020    Procedure: Create Av Fistula (Separt Proc); Verner Mould;  Surgeon: Leona Carry, MD;  Location: MAIN OR [facility];  Service: Transplant   ??? PR CREAT AV FISTULA,NON-AUTOGENOUS GRAFT Left 06/08/2020    Procedure: CREATE AV FISTULA (SEPARATE PROC); NONAUTOGENOUS GRAFT (EG, BIOLOGICAL COLLAGEN, THERMOPLASTIC GRAFT);  Surgeon: Leona Carry, MD;  Location: MAIN OR [facility];  Service: Transplant   ??? PR EXPLORATION N/FLWD SURG UPPER EXTREMITY ARTERY Left 08/02/2020 Procedure: Exploration Not Followed By Surgical Repair, Artery; Upper Extremity (Eg, Axillary, Brachial, Radial, Ulnar);  Surgeon: Earney Mallet, MD;  Location: MAIN OR [facility];  Service: Vascular   ??? PR INCIS/DRAIN ARM,DEEP ABSC/HEMATOMA Left 08/14/2020    Procedure: INCISION AND DRAINAGE, UPPER ARM OR ELBOW AREA; DEEP ABSCESS OR HEMATOMA;  Surgeon: Earney Mallet, MD;  Location: MAIN OR [facility];  Service: Vascular   ??? PR INSERTION TUNNEL INTRAPERITONEAL CATH DIAL OPEN Midline 02/01/2021    Procedure: INSERTION OF INTRAPERITONEAL CANNULA OR CATHETER FOR DRAINAGE OR DIALYSIS; PERMANENT;  Surgeon: Loney Hering, MD;  Location: MAIN OR [facility];  Service: Transplant   ??? PR REMV ART CLOT AXILL-BRACH,ARM INCIS Left 08/02/2020    Procedure: Embolect/Thrombec; Axilry/Brachial Art-Arm Incs;  Surgeon: Earney Mallet, MD;  Location: MAIN OR [facility];  Service: Vascular   ??? PR UPPER GI ENDOSCOPY,BIOPSY N/A 08/29/2017    Procedure: UGI ENDOSCOPY; WITH BIOPSY, SINGLE OR MULTIPLE;  Surgeon: Neysa Hotter, MD;  Location: GI PROCED[facility] [facility];  Service: Gastroenterology   ??? PR VEIN BYPASS GRAFT,SUBCL-BRACHIAL Left 08/05/2020    Procedure: Bypass Graft, With Vein; Subclavian-Brachial;  Surgeon: Earney Mallet, MD;  Location: MAIN OR [facility];  Service: Vascular   ??? SALPINGECTOMY Left 04/12/2000    Ectopic pregnancy   ??? SALPINGECTOMY Right 11/22/2015    at time of total vaginal hysterectomy   ??? TONSILECTOMY, ADENOIDECTOMY, BILATERAL MYRINGOTOMY AND TUBES     ??? TOTAL VAGINAL HYSTERECTOMY  11/22/2015    With R salpingectomy       SUBJECTIVE:  The pain is still there      History of Present Condition: Per referring physician's note:  ASSESSMENT/PLAN:  1. Stenosis of left brachial artery (CMS-HCC)  2. 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 Left arm weakness  Here today to have form filled out with ICD-10 codes for conditions (listed above) that would help get her home health services. Failed LUE fistula with recurrent thrombosis and multiple stents in LUE, now leaving her L arm weaker than her right. 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    Assessment/Plan:    [name] is a 50 y.o. 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 ??She is one month s/p LUE angiogram with PTA of the distal anastomosis of the left subclavian to brachial bypass and PTA of the left brachial artery.  Duplex imaging demonstrates patent bypass however she has??occlusion of the mid to distal brachial artery, dampened flow in the in the radial artery and dampened flow in the proximal to mid ulnar artery.  Her symptoms have not worsened in severity since her last visit.  [name] was informed that we have no further intervention to offer her at this time.  We recommend she continue with medical management with eliquis.  We will follow-up with her in 6 months with repeat LUE arterial duplex.   ??  HPI: [name] is a 50 y.o. female with a history of??asthma, FSGS, GERD, gout, HLD, HTN and ESRD on dialysis. ??She underwent left??brachio-cephalic??vein AVF??(01/29/20) which thrombosed prior to maturation and underwent left brachiobasilic AVG??on 06/08/20 with Dr. Norma Fredrickson which also failed and was never used. ??Since the brachiobasilic AVG was created in August, she experienced left arm pain, numbness in the hand, and some weakness of the left upper extremity. These symptoms initially had some improvement with grip strengh exercises.????She presented to the hospital on 08/02/20 after having severe pain, cold fingertips, paresthesias and decreased LUE pulses with arterial duplex demonstrating occlusion of mid/distal brachial artery??and no flow in distal radial artery or ulnar artery.????On 10/12 she underwent surgical exposure of left brachial artery??and thromboembolectomy of left brachial artery and ulnar artery. ??On 10/15 she underwent left subclavian to brachial artery bypass using reversed GSV??with right lower extremity GSV harvest. ??She developed leukocytosis with CT imaging demonstrating a 3 cm collection with air in the upper arm near the vein  graft, and on 10/24 she underwent LUE wound exploration and washout, excisional debridement of subcutaneous tissue and wound vac placement. ??She finished a 14 day course of antibiotics on 11/6. ??She was last seen in clinic on??01/25/21 with duplex findings of patent bypass with possible obstruction distal to the bypass graft with dampened flow in the radial and ulnar arteries.  On 02/09/21 she underwent LUE angiogram with PTA of the distal anastomosis of the left subclavian to brachial bypass and PTA of the left brachial artery.  She presents today for one month follow-up.      [name] presents to the clinic today with family.   She reports the same symptoms as prior to her most recent intervention, including: arm pain that alternates throughout the entire arm to her fingers, paresthesias and decreased left hand grip.  She has done OT for her LUE.  She recently underwent PD catheter placement and is currently receiving education regarding PD.  She continues to take eliquis.     Precautions: on home dialysis    Prior Therapies: AIR PT/OT    Patient???s Goals: get functional use back of my hand    Pain  Pain present?: LUE, 7/10- worse with movement and resistance, hurts from scapula and shoulder all the way down arm     Social History:   Lives with: husband Lisbeth Ply   Stories: trailer- 4STE  Bathroom Layout: tub shower   Home Equipment: shower bench, cane, wrist brace (bought it herself for wrist drop)    PLOF: prior to October 2021 no issues with LUE and completely independent     OBJECTIVE  Current Level of Function  Feeding: cutting food is hard, feeds self with RUE  Grooming: RUE only  Toileting: sometimes harder to pull up pants due to using only one hand   Bathing: sitting to bathe, hard to wash bottom, washing self with RUE but slightly hard   Dressing UE: wearing sports bras, shirts are fine  Dressing LE: pulling up pants hard, hard to put socks on with 1 hand (trying to put on L thumb to help but struggling), can't put on shoes  Transfers (bed, chair, toilet, shower): independent  Bed Mobility: independent    Instrumental ADLs:  Medication management: opening containers is hard- rec weekly pill box to sort pills to avoid struggling to open   Health management: not exercising currently   Caregiver Availability/Willingness/Capability: husband Garment/textile technologist:   Vocational Pursuits: not working currently (hasn't worked since 1994)  Communication Management: writing and texting ok with R hand, not typing much   Cooking: no  Shopping: goes grocery and clothes shopping- hard to pick things up with left hand while shopping  Laundry: struggling to fold clothes   Cleaning: no dishwasher, has to wash by hand, will hold with L hand and wash with R but this is very hard   Pet Care: 1 dog (husky pitbull)  Yard work: no  Driving per Patient report: No  Safety: Patient does use good safety/judgement for household/daily tasks.     Sleep: hard to get comfortable, hand aches most in the night, also has sleep apnea, reports if she elevates her LUE with a pillow it helps, Tylenol helps sometimes     Recommended trying to wear her splint at night to see if this alleviates some of her pain      Strength Testing  Slightly less than full range of flexion in BUEs  Remainder of RUE AROM full  Did  not MMT due to significant pain in LUE     Left ROM Right ROM   Shoulder flex < full WFL    Shoulder abd < full WFL    Biceps  full WFL    Triceps Slightly less than full WFL    External rot Painful, unable to fully do [facility]    Internal rot Painful, unable to fully do [facility]    Wrist ext Limited due to fistula on volar wrist WFL    Wrist flex Limited due to fistula on volar wrist WFL    Supination [facility] WFL    Pronation [facility] WFL      Sharp pain in entire LUE with movement  Impaired opposition in LUE reaching 4th and 5th digit        Grip Strength:  Grip Strength: A quantitative and objective measure of isometric muscular strength of the hand and forearm.  This instrument is scored using force production in pounds    Standardized procedure for positioning consists of the subject seated with back, pelvis, and knees as close to 90 degrees as possible, shoulder is adducted and neutrally rotated, elbow flexed at 90 degrees, forearm neutral, wrist held between 0-15 degrees of ulnar deviation. The arm is not supported by examiner or armrest and the dynamometer is presented vertically and in line with the forearm. Maximum grip is the mean of three trials.        L grip (pounds): 0  R grip (pounds): 35, 31, 11  Female -  age 79-54 R: 6-87 L: 35-76      Coordination  Nine Hole Peg Test RUE: 21s LUE 49s  Age 41-54 R 13-23 L 14-26        Edema:   Mild swelling in LUE       Sensation:  Light Touch: reports equal feeling of light touch, but left arm feels like sandpaper  Sharp/Dull:   Proprioception: impaired finger to nose   Stereognosis: impaired  Comments: regularly feels like she's being stuck with needles, shooting pains, numb, electricity      Vision and Visual Perception:   Wears glasses for distance and reading      Treatment Rendered  Therapeutic exercises (38 mins)  AROM Wrist flexion, extension, radial and ulnar deviation, wrist circumduction   Shoulder rolls, scapular retraction, external rotation, shoulder flexion towel slide on table top, shoulder flexion in supine   Discussion of and completion of scar massage to scars as pt reports pulling sensation throughout scars. Discussed Scar Away silicon strip product for scar management.    HEP  Wrist flexion/extension/Rd/Ud/Circumduction  Shoulder rolls, external rotation, scap squeezes, towel slide, supine flexion   Scar massage   Scar away strips      Total Treatment Time:  38 minutes    Education:  Topics:Disease Glass blower/designer Exercise  Splinting  Home Medical illustrator  Education Provided to: patient and spouse  Education Type:Demonstration and Explanation  Response to education/teachback:Verbal understanding recieved    I reviewed the no-show/attendance policy with the patient and caregiver(s). The family is aware that they must call to cancel appointments more than 24 hours in advance. They are also aware that if they late cancel or no-show three times, we reserve the right to cancel their remaining appointments. This policy is in place to allow [place] to best serve the needs of our caseload.       I attest that I have reviewed the  above information.  Signed: Julian Hy, OT 05/15/2021 3:05 PM

## 2021-05-18 NOTE — Unmapped (Signed)
You have a fax in your box form  requiring your signature

## 2021-05-18 NOTE — Unmapped (Signed)
[facility]  [address]                                    [place], [place] [address]    Silvana did not show for their scheduled Occupational Therapy follow-up session.  Aerabella called within 24 hours of her scheduled appointment to cancel due to pain. This is the patient's first offense.      Signed: Lavone Orn Marlies Ligman, OT  05/18/2021, 3:15 PM

## 2021-05-19 MED FILL — PEG 3350-ELECTROLYTES 236 GRAM-22.74 GRAM-6.74 GRAM-5.86 GRAM SOLUTION: ORAL | 1 days supply | Qty: 4000 | Fill #0

## 2021-05-19 NOTE — Unmapped (Signed)
Called patient and LM.  Attempting to see if patient has found a local MH counselor.  Also attempting to see if her colonoscopy and mammogram are completed.  Pap smear completed 05/02/2021.

## 2021-05-22 NOTE — Unmapped (Signed)
Patient returned call.  Called patient back and left another message for patient to return call.

## 2021-05-22 NOTE — Unmapped (Signed)
[facility]  [address]                                    [place], [place] [address]    Megan Robertson did not show for their scheduled Occupational Therapy follow-up session.  [name] called to cancel within 24 hours of her appointment time.  This is the patient's second offense.      Signed: Julian Hy, OT  05/22/2021, 3:56 PM

## 2021-05-26 NOTE — Unmapped (Signed)
Patient called back and stated that she has not engaged in local mental health counseling.  She does have her mammogram and colonoscopy scheduled though.  She gave the phone to her husband to help her answer my question about the mental health counselor.  Her husband stated she has not started therapy but it would be helpful.  They do not have any idea on who to contact.  I let them know I would have someone send a list of local therapists that she may be able to engage with.  She was also directed to her dialysis social worker for some assistance on finding a Scientific laboratory technician.  This message was routed to Dr. Bishop Limbo as she may have a list for the patient.

## 2021-05-26 NOTE — Unmapped (Signed)
Patient called again and LM.  I called patient and left another message for her to return call.

## 2021-05-29 NOTE — Unmapped (Signed)
Faxed

## 2021-06-06 NOTE — Unmapped (Signed)
Patient Advice Request - Route to Ancillary Staff [facility] Name and relation (if other than patient): self  Question/Concern for provider (Specific): The patient would like for her provider to give her a call concerning her left arm that is extremely sore with a knot located on the arm and is tender to the touch.  Best Contact Time: asap  Other info: I tried scheduling an appointment with her provider and her provider didn't have anything until October.  The patient declined to see another provider.  She only wanted me to send a message.  I sent the patient to nurse connect.

## 2021-06-06 NOTE — Unmapped (Signed)
Called x2 and left message. No contact    Answer Assessment - Initial Assessment Questions  N/A  No contact    Protocols used: NO CONTACT OR DUPLICATE CONTACT CALL-ADULT-[place]

## 2021-06-06 NOTE — Unmapped (Signed)
Regarding: pt. would like the nurse to call her  ----- Message from Heath Lark sent at 06/06/2021  3:50 PM EDT -----  After a procedure, patient's left arm is extremely sore, knot located on the arm tender to touch

## 2021-06-06 NOTE — Unmapped (Signed)
Recent:   What is the date of your last related visit? 02/09/21 stents placed in L arm. Does follow up at [facility] with Dr Derrill Kay?  Related acute medications Rx'd:  eliquis  Home treatment tried:  tylenol  COVID Vaccine: n/a    Relevant:   Allergies: Cephalexin, Mango, Peach (prunus persica), Wellbutrin [bupropion hcl], Egg, Fish containing products, Lactase-rennet, Lexiscan [regadenoson], Mushroom, and Tomato (solanum lycopersicum)  Medications: n/a  Health History: ESRD  Weight: n/a    Dispo: Go to ED. Advised calling [place] MD for second opinion since complicated case and pt see's them for f/u      Reason for Disposition  ??? Sounds like a serious complication to the triager    Answer Assessment - Initial Assessment Questions  1. SYMPTOM: What's the main symptom you're concerned about? (e.g., pain, fever, vomiting)      L arm sore and has a knot  2. ONSET: When did knot start?      Started 3 weeks ago  3. SURGERY: What surgery was performed?      Multiple stent placement  4. DATE of SURGERY: When was surgery performed?       02/10/20  5. ANESTHESIA:  What type of anesthesia did you have? (e.g., general, spinal, epidural, local)      n/a  6. PAIN: Is there any pain? If Yes, ask: How bad is it?  (Scale 1-10; or mild, moderate, severe)      Pain is currently rated 10/10  7. FEVER: Do you have a fever? If Yes, ask: What is your temperature, how was it measured, and when did it start?      Denies   8. VOMITING: Is there any vomiting? If yes, ask: How many times?      Denies   9. BLEEDING: Is there any bleeding? If Yes, ask: How much? and Where?      Denies   10. OTHER SYMPTOMS: Do you have any other symptoms? (e.g., drainage from wound, painful urination, constipation)       Knot in arm is about quarter sized and very tender to touch, pain rated 10/10    Protocols used: POST-OP SYMPTOMS AND QUESTIONS-A-AH

## 2021-06-08 ENCOUNTER — Ambulatory Visit: Admit: 2021-06-08 | Discharge: 2021-06-09 | Payer: MEDICARE

## 2021-06-09 ENCOUNTER — Ambulatory Visit: Admit: 2021-06-09 | Discharge: 2021-06-13 | Payer: MEDICARE

## 2021-06-09 MED ORDER — PEG 3350-ELECTROLYTES 236 GRAM-22.74 GRAM-6.74 GRAM-5.86 GRAM SOLUTION
Freq: Once | ORAL | 0 refills | 1 days | Status: CP
Start: 2021-06-09 — End: 2021-06-09

## 2021-06-09 NOTE — Unmapped (Signed)
06/09/21 Records show patient is currently in the hospital. sed

## 2021-06-12 NOTE — Unmapped (Signed)
[facility] Refill Coordination Note    Specialty Medication(s) to be Shipped:   General Specialty: Repatha    Other medication(s) to be shipped: No additional medications requested for fill at this time     Megan Robertson, DOB: 1971-10-08  Phone: [phone] (home)       All above HIPAA information was verified with patient.     Was a Nurse, learning disability used for this call? No    Completed refill call assessment today to schedule patient's medication shipment from the [facility] [phone]).  All relevant notes have been reviewed.     Specialty medication(s) and dose(s) confirmed: Regimen is correct and unchanged.   Changes to medications: Tamea reports no changes at this time.  Changes to insurance: No  New side effects reported not previously addressed with a pharmacist or physician: None reported  Questions for the pharmacist: No    Confirmed patient received a Conservation officer, historic buildings and a Surveyor, mining with first shipment. The patient will receive a drug information handout for each medication shipped and additional FDA Medication Guides as required.       DISEASE/MEDICATION-SPECIFIC INFORMATION        For patients on injectable medications: Patient currently has 1 doses left.  Next injection is scheduled for 8/31.    SPECIALTY MEDICATION ADHERENCE     Medication Adherence    Patient reported X missed doses in the last month: 0  Specialty Medication: Repatha 140mg /ml  Patient is on additional specialty medications: No  Patient is on more than two specialty medications: No  Any gaps in refill history greater than 2 weeks in the last 3 months: no  Demonstrates understanding of importance of adherence: yes  Informant: patient              Were doses missed due to medication being on hold? No    Repatha 140mg /ml: Patient has 14 days of medication on hand    REFERRAL TO PHARMACIST     Referral to the pharmacist: Not needed      [facility]     Shipping address confirmed in Epic.     Delivery Scheduled: Yes, Expected medication delivery date: 8/26.     Medication will be delivered via Same Day Courier to the prescription address in Epic WAM.    Olga Millers   [facility] Specialty Technician

## 2021-06-14 ENCOUNTER — Ambulatory Visit: Admit: 2021-06-14 | Discharge: 2021-06-15 | Payer: MEDICARE | Attending: Vascular Surgery | Primary: Vascular Surgery

## 2021-06-14 DIAGNOSIS — M79602 Pain in left arm: Principal | ICD-10-CM

## 2021-06-14 NOTE — Unmapped (Unsigned)
Visit date:  06/14/21  ??  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 is a 50 y.o. 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 ??She is s/p LUE angiogram with PTA of the distal anastomosis of the left subclavian to brachial bypass and PTA of the left brachial artery.  She presents today with left arm pain with worsening pain with hand/wrist movement. She has + distal doppler signals.   No vascular cause seen for her symptoms.  Given her symptoms worsen with movement we recommend she orthopedics; referral submitted.  Follow-up in November per previous plan.       HPI: [name] is a 50 y.o. female with a history of??asthma, FSGS, GERD, gout, HLD, HTN and ESRD on dialysis. ??She underwent left??brachio-cephalic??vein AVF??(01/29/20) which thrombosed prior to maturation and underwent left brachiobasilic AVG??on 06/08/20 with Dr. Norma Robertson which also failed and was never used. ??Since the brachiobasilic AVG was created in August, she experienced left arm pain, numbness in the hand, and some weakness of the left upper extremity. These symptoms initially had some improvement with grip strengh exercises.????She presented to the hospital on 08/02/20 after having severe pain, cold fingertips, paresthesias and decreased LUE pulses with arterial duplex demonstrating occlusion of mid/distal brachial artery??and no flow in distal radial artery or ulnar artery.????On 10/12 she underwent surgical exposure of left brachial artery??and thromboembolectomy of left brachial artery and ulnar artery. ??On 10/15 she underwent left subclavian to brachial artery bypass using reversed GSV??with right lower extremity GSV harvest. ??She developed leukocytosis with CT imaging demonstrating a 3 cm collection with air in the upper arm near the vein graft, and on 10/24 she underwent LUE wound exploration and washout, excisional debridement of subcutaneous tissue and wound vac placement. ??She finished a 14 day course of antibiotics on 11/6. ??She was last seen in clinic on??01/25/21 with duplex findings of patent bypass with possible obstruction distal to the bypass graft with dampened flow in the radial and ulnar arteries.  On 02/09/21 she underwent LUE angiogram with PTA of the distal anastomosis of the left subclavian to brachial bypass and PTA of the left brachial artery.  She was last seen in clinic on 03/08/21 and presents today with complaints of a knot and pain in her left forearm.  This started 2 weeks ago.  She denies trauma to her arm.  She endorses LUE swelling and a tight and pulling feeling in her left forearm.  It hurts most when moving her hand.  She also reports right thigh soreness and pulling at her vein harvest site.  She states her right thigh incision has healed.  [name] presents to the clinic today with her husband.       Allergies   Allergen Reactions   ??? Cephalexin Itching     unknown; tolerates cefepime   ??? Mango Itching   ??? Peach (Prunus Persica) Itching   ??? Wellbutrin [Bupropion Hcl] Other (See Comments)     Seizure   ??? Egg Itching   ??? Fish Containing Products Itching   ??? Lactase-Rennet Nausea And  Vomiting   ??? Lexiscan [Regadenoson] Nausea And Vomiting     Patient had nausea after the Lexi then vomitted [id number]   ??? Mushroom Itching   ??? Tomato (Solanum Lycopersicum) Itching       Current Outpatient Medications   Medication Sig Dispense Refill   ??? adalimumab (HUMIRA) 40 mg/0.8 mL subcutaneous pen kit Inject the contents of 2 pens (80mg ) under the skin once for initial dose, then inject 1 pen (40mg ) every 2 weeks. 6 each 3   ??? allopurinoL (ZYLOPRIM) 100 MG tablet Take 1 tablet (100 mg total) by mouth Every Tuesday, Thursday and Saturday. 36 tablet 3   ??? amLODIPine (NORVASC) 10 MG tablet Take 1 tablet (10 mg total) by mouth daily. 90 tablet 3   ??? apixaban (ELIQUIS) 5 mg Tab Take 5 mg by mouth daily.     ??? atorvastatin (LIPITOR) 80 MG tablet Take 1 tablet (80 mg total) by mouth daily. 30 tablet 0   ??? beclomethasone dipropionate (QVAR REDIHALER) 80 mcg/actuation inhaler 1 puff every morning, 2 puffs every evening (Patient taking differently: continuous as needed. 1 puff every morning, 2 puffs every evening as needed) 10.6 g 0   ??? blood glucose control, normal (ACCU-CHEK SMARTVIEW CONTRL SOL) Soln 1 Bottle by Miscellaneous route once as needed. for up to 1 dose Use with each new box of test strips and as instructed 1 each 2   ??? blood sugar diagnostic (ACCU-CHEK SMARTVIEW TEST STRIP) Strp by Other route every morning before breakfast. For use with glucometer to check blood sugar daily 50 strip 5   ??? blood-glucose meter (ACCU-CHEK NANO) Misc 1 kit by Miscellaneous route every morning before breakfast. To check glucose level daily 1 each 0   ??? clobetasoL (TEMOVATE) 0.05 % ointment Place ointment on outside of vulva nightly for 2 weeks and then twice a week thereafter. 45 g 0   ??? evolocumab (REPATHA SURECLICK) 140 mg/mL PnIj Inject the contents of 1 pen (140 mg) under the skin every fourteen (14) days. 6 mL 3   ??? furosemide (LASIX) 40 MG tablet Take 1 tablet (40 mg total) by mouth 3 (three) times a week. To be taken once a day if you develop leg swelling. 36 tablet 3   ??? gentamicin (GARAMYCIN) 0.1 % cream APPLY A SMALL AMOUNT  TO EXIT SITE DAILY WITH DRESSING CHANGE     ??? isosorbide mononitrate (IMDUR) 120 MG 24 hr tablet Take 1 tablet (120 mg total) by mouth daily. 30 tablet 0   ??? lancets (ACCU-CHEK FASTCLIX LANCET DRUM) Misc 1 Units by Miscellaneous route every morning before breakfast. 102 each 5   ??? lancing device with lancets (ACCU-CHEK FASTCLIX LANCING DEV) Kit 1 kit by Miscellaneous route every morning before breakfast. With glucometer/lancets to check blood sugar daily 1 each 1   ??? methoxy peg-epoetin beta (MIRCERA INJ) 75 mcg.     ??? sevelamer (RENVELA) 800 mg tablet TAKE 1 TABLET BY MOUTH THREE TIMES A DAY WITH MEALS     ??? triamcinolone (KENALOG) 0.1 % ointment APPLY  TOPICALLY TWICE DAILY TO  AFFECTED  AREAS (Patient taking differently: APPLY  TOPICALLY TWICE DAILY TO  AFFECTED  AREAS) 454 g 0   ??? aspirin 81 MG chewable tablet Chew 1 tablet (81 mg total) daily. 36 tablet 0     No current facility-administered medications for this visit.       Past Medical History:   Diagnosis Date   ??? Abnormal mammogram    ???  Abnormal Pap smear of cervix    ??? Anxiety    ??? Arthritis    ??? Asthma    ??? Back pain    ??? Depression    ??? ESRD (end stage renal disease) on dialysis (CMS-HCC)    ??? FSGS (focal segmental glomerulosclerosis) 1997    renal biopsy at [facility]   ??? GERD (gastroesophageal reflux disease)    ??? Gout    ??? H/O adenoidectomy     had adenoids removed   ??? Hypercholesteremia    ??? Hypertension    ??? Ovarian cancer (CMS-HCC)    ??? Pancreatitis    ??? Seizure (CMS-HCC)     unknown etiology; none for several years       Past Surgical History:   Procedure Laterality Date   ??? APPENDECTOMY     ??? BREAST EXCISIONAL BIOPSY Right 11/28/2016   ??? CHEMOTHERAPY      for ovarian CA in 2000   ??? CHG US GUIDE, VASCULAR ACCESS N/A 01/29/2020    Procedure: ULTRASOUND GUIDANCE FOR VASC ACCESS REQUIRING [place] EVAL OF POTENTIAL ACCESS SITES;  Surgeon: Leona Carry, MD;  Location: MAIN OR [facility];  Service: Transplant   ??? CHG US GUIDE, VASCULAR ACCESS N/A 06/08/2020    Procedure: ULTRASOUND GUIDANCE FOR VASC ACCESS REQUIRING [place] EVAL OF POTENTIAL ACCESS SITES;  Surgeon: Leona Carry, MD;  Location: MAIN OR [facility];  Service: Transplant   ??? CHOLECYSTECTOMY     ??? COMBINED HYSTEROSCOPY DIAGNOSTIC / D&C  07/07/2015    Planned for endometrial ablation, but patient had uterine perforation after D&C and unable to perform   ??? HYSTERECTOMY     ??? LEFT OOPHORECTOMY Left 04/12/2000    Removed with L fallopian tube for ectopic pregnancy   ??? OOPHORECTOMY     ??? PR CREAT AV FISTULA,AUTOGENOUS GRAFT Left 01/29/2020    Procedure: Create Av Fistula (Separt Proc); Verner Mould;  Surgeon: Leona Carry, MD;  Location: MAIN OR [facility];  Service: Transplant   ??? PR CREAT AV FISTULA,NON-AUTOGENOUS GRAFT Left 06/08/2020    Procedure: CREATE AV FISTULA (SEPARATE PROC); NONAUTOGENOUS GRAFT (EG, BIOLOGICAL COLLAGEN, THERMOPLASTIC GRAFT);  Surgeon: Leona Carry, MD;  Location: MAIN OR [facility];  Service: Transplant   ??? PR EXPLORATION N/FLWD SURG UPPER EXTREMITY ARTERY Left 08/02/2020    Procedure: Exploration Not Followed By Surgical Repair, Artery; Upper Extremity (Eg, Axillary, Brachial, Radial, Ulnar);  Surgeon: Earney Mallet, MD;  Location: MAIN OR [facility];  Service: Vascular   ??? PR INCIS/DRAIN ARM,DEEP ABSC/HEMATOMA Left 08/14/2020    Procedure: INCISION AND DRAINAGE, UPPER ARM OR ELBOW AREA; DEEP ABSCESS OR HEMATOMA;  Surgeon: Earney Mallet, MD;  Location: MAIN OR [facility];  Service: Vascular   ??? PR INSERTION TUNNEL INTRAPERITONEAL CATH DIAL OPEN Midline 02/01/2021    Procedure: INSERTION OF INTRAPERITONEAL CANNULA OR CATHETER FOR DRAINAGE OR DIALYSIS; PERMANENT;  Surgeon: Loney Hering, MD;  Location: MAIN OR [facility];  Service: Transplant   ??? PR REMV ART CLOT AXILL-BRACH,ARM INCIS Left 08/02/2020    Procedure: Embolect/Thrombec; Axilry/Brachial Art-Arm Incs;  Surgeon: Earney Mallet, MD;  Location: MAIN OR [facility];  Service: Vascular   ??? PR UPPER GI ENDOSCOPY,BIOPSY N/A 08/29/2017    Procedure: UGI ENDOSCOPY; WITH BIOPSY, SINGLE OR MULTIPLE;  Surgeon: Neysa Hotter, MD;  Location: GI PROCEDURES [place] [facility];  Service: Gastroenterology   ??? PR VEIN BYPASS GRAFT,SUBCL-BRACHIAL Left 08/05/2020 Procedure: Bypass Graft, With Vein; Subclavian-Brachial;  Surgeon: Earney Mallet, MD;  Location: MAIN OR [facility];  Service: Vascular   ??? SALPINGECTOMY Left 04/12/2000    Ectopic pregnancy   ??? SALPINGECTOMY Right 11/22/2015    at time of total vaginal hysterectomy   ??? TONSILECTOMY, ADENOIDECTOMY, BILATERAL MYRINGOTOMY AND TUBES     ??? TOTAL VAGINAL HYSTERECTOMY  11/22/2015    With R salpingectomy        Social History     Socioeconomic History   ??? Marital status: Married   Tobacco Use   ??? Smoking status: Former Smoker     Types: Cigarettes     Quit date: 06/30/2010     Years since quitting: 10.9   ??? Smokeless tobacco: Never Used   Vaping Use   ??? Vaping Use: Never used   Substance and Sexual Activity   ??? Alcohol use: No   ??? Drug use: No   ??? Sexual activity: Yes     Partners: Male     Birth control/protection: None   Other Topics Concern   ??? Do you use sunscreen? No   ??? Tanning bed use? No   ??? Are you easily burned? No   ??? Excessive sun exposure? No   ??? Blistering sunburns? No   Social History Narrative    on disability since 1994, but does not qualify anymore since she got married and her husband has income.     Lives in [place] with husband, Lisbeth Ply and 2 children       Family History   Adopted: Yes   Problem Relation Age of Onset   ??? Heart attack Mother 38   ??? No Known Problems Father    ??? No Known Problems Sister    ??? No Known Problems Daughter    ??? No Known Problems Maternal Grandmother    ??? No Known Problems Maternal Grandfather    ??? No Known Problems Paternal Grandmother    ??? No Known Problems Paternal Grandfather    ??? No Known Problems Brother    ??? No Known Problems Son    ??? No Known Problems Maternal Aunt    ??? No Known Problems Maternal Uncle    ??? No Known Problems Paternal Aunt    ??? No Known Problems Paternal Uncle    ??? No Known Problems Other    ??? Anesthesia problems Neg Hx    ??? Broken bones Neg Hx    ??? Cancer Neg Hx    ??? Clotting disorder Neg Hx    ??? Collagen disease Neg Hx    ??? Diabetes Neg Hx    ??? Dislocations Neg Hx    ??? Fibromyalgia Neg Hx    ??? Gout Neg Hx    ??? Hemophilia Neg Hx    ??? Osteoporosis Neg Hx    ??? Rheumatologic disease Neg Hx    ??? Scoliosis Neg Hx    ??? Severe sprains Neg Hx    ??? Sickle cell anemia Neg Hx    ??? Spinal Compression Fracture Neg Hx    ??? Melanoma Neg Hx    ??? Basal cell carcinoma Neg Hx    ??? Squamous cell carcinoma Neg Hx    ??? Breast cancer Neg Hx        ROS:  A 10 system ROS was reviewed and was negative aside from that mentioned in the HPI.      PE:    Vitals:    06/14/21 1024   BP: 133/77   Pulse: 102   Temp: 37.2 ??C (98.9 ??F)  General: WD, WN female in NAD.    Cardiovascular:  HDS. + left radial doppler and left ulnar doppler signals.     Lungs: Respirations even, nonlabored.    Musculoskeletal:  No clubbing, cyanosis or edema.  Good ROM of right hand.     Neurological: Alert and oriented x 4.  CN II- XII grossly intact.  Steady gait. Sensation intact.     Psych/Mental Health:  Appropriate affect. appreciable fluid wave shift.  No pulsatile masses.    Genitourinary: No CVA tenderness bilaterally.    Musculoskeletal:  No clubbing, cyanosis or edema.  No joint pain upon palpation.  No popliteal masses present.    Neurological: Alert and oriented x 4.  CN II- XII grossly intact.  Steady gait. Sensation intact.     Psych/Mental Health:  Appropriate affect.

## 2021-06-16 DIAGNOSIS — E785 Hyperlipidemia, unspecified: Principal | ICD-10-CM

## 2021-06-16 MED FILL — REPATHA SURECLICK 140 MG/ML SUBCUTANEOUS PEN INJECTOR: SUBCUTANEOUS | 28 days supply | Qty: 2 | Fill #2

## 2021-06-19 ENCOUNTER — Ambulatory Visit: Admit: 2021-06-19 | Discharge: 2021-06-20 | Payer: MEDICARE | Attending: Internal Medicine | Primary: Internal Medicine

## 2021-06-20 NOTE — Unmapped (Signed)
ORTHOPAEDIC NEW CLINIC NOTE     Megan Robertson is seen in consultation at the request of Olene Floss, Fnp  [address]  [address]; [address]  [place],  [place] [address] For evaluation of Arm Pain       ASSESSMENT:  Megan Robertson is a 50 y.o. female with:  1. Pain of left upper extremity         PLAN:  She does have limited range of motion in her left elbow, likely indicating some arthritic change.  However, the area of pain on her posterior forearm is unlikely to be related to a primary elbow issue.  Deferred x-rays.  Her orthopedic musculoskeletal exam does not localize her pain.  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  Performed limited musculoskeletal ultrasound of the area and saw no cystic change along the extensor tendons.  However, there was a very enlarged, partially compressible venous structure posteriorly, superficial to the extensor tendons and muscles.  I will discuss this finding with Dr. Coralee Rud     Follow Up: Return if symptoms worsen or fail to improve.     No Procedures Performed      SUBJECTIVE:  Chief Complaint:  Arm Pain       History of Present Illness:     50 y.o. female  that presents to clinic today for:     Left dorsal forearm pain.  She has a complex recent medical history and is referred by Dr. Coralee Rud of vascular surgery.  She has a history of a left upper extremity graft which clotted and required bypass with thromboembolectomy complicated by a wound infection.  She reports being told that there was nerve injury as a complication of this condition and treatment and she is been attending hand therapy since her surgery.  Around two months ago, she began to experience pain in the mid distal aspect of her dorsal forearm with associated swelling.  She is unable to fully extend or supinate her elbow.  She has decreased grip strength.  Endorses numbness and tingling in her hand.  All motions of her hand and wrist are painful.  She also has rest pain.  The dorsal area of pain and swelling is exquisitely tender to touch and has appeared to enlarge in her few    She is accompanied by her husband.        All relevant orthopedic medical chart documents reviewed.      Medical History  Past Medical History:   Diagnosis Date   ??? Abnormal mammogram    ??? Abnormal Pap smear of cervix    ??? Anxiety    ??? Arthritis    ??? Asthma    ??? Back pain    ??? Depression    ??? ESRD (end stage renal disease) on dialysis (CMS-HCC)    ??? FSGS (focal segmental glomerulosclerosis) 1997    renal biopsy at [facility]   ??? GERD (gastroesophageal reflux disease)    ??? Gout    ??? H/O adenoidectomy     had adenoids removed   ??? Hypercholesteremia    ??? Hypertension    ??? Ovarian cancer (CMS-HCC)    ??? Pancreatitis    ??? Seizure (CMS-HCC)     unknown etiology; none for several years    Surgical History  Past Surgical History:   Procedure Laterality Date   ??? APPENDECTOMY     ???  BREAST EXCISIONAL BIOPSY Right 11/28/2016   ??? CHEMOTHERAPY      for ovarian CA in 2000   ??? CHG US GUIDE, VASCULAR ACCESS N/A 01/29/2020    Procedure: ULTRASOUND GUIDANCE FOR VASC ACCESS REQUIRING [place] EVAL OF POTENTIAL ACCESS SITES;  Surgeon: Leona Carry, MD;  Location: MAIN OR [facility];  Service: Transplant   ??? CHG US GUIDE, VASCULAR ACCESS N/A 06/08/2020    Procedure: ULTRASOUND GUIDANCE FOR VASC ACCESS REQUIRING [place] EVAL OF POTENTIAL ACCESS SITES;  Surgeon: Leona Carry, MD;  Location: MAIN OR [facility];  Service: Transplant   ??? CHOLECYSTECTOMY     ??? COMBINED HYSTEROSCOPY DIAGNOSTIC / D&C  07/07/2015    Planned for endometrial ablation, but patient had uterine perforation after D&C and unable to perform   ??? HYSTERECTOMY     ??? LEFT OOPHORECTOMY Left 04/12/2000    Removed with L fallopian tube for ectopic pregnancy   ??? OOPHORECTOMY     ??? PR CREAT AV FISTULA,AUTOGENOUS GRAFT Left 01/29/2020    Procedure: Create Av Fistula (Separt Proc); Verner Mould;  Surgeon: Leona Carry, MD;  Location: MAIN OR [facility];  Service: Transplant   ??? PR CREAT AV FISTULA,NON-AUTOGENOUS GRAFT Left 06/08/2020    Procedure: CREATE AV FISTULA (SEPARATE PROC); NONAUTOGENOUS GRAFT (EG, BIOLOGICAL COLLAGEN, THERMOPLASTIC GRAFT);  Surgeon: Leona Carry, MD;  Location: MAIN OR [facility];  Service: Transplant   ??? PR EXPLORATION N/FLWD SURG UPPER EXTREMITY ARTERY Left 08/02/2020    Procedure: Exploration Not Followed By Surgical Repair, Artery; Upper Extremity (Eg, Axillary, Brachial, Radial, Ulnar);  Surgeon: Earney Mallet, MD;  Location: MAIN OR [facility];  Service: Vascular   ??? PR INCIS/DRAIN ARM,DEEP ABSC/HEMATOMA Left 08/14/2020    Procedure: INCISION AND DRAINAGE, UPPER ARM OR ELBOW AREA; DEEP ABSCESS OR HEMATOMA;  Surgeon: Earney Mallet, MD;  Location: MAIN OR [facility];  Service: Vascular   ??? PR INSERTION TUNNEL INTRAPERITONEAL CATH DIAL OPEN Midline 02/01/2021    Procedure: INSERTION OF INTRAPERITONEAL CANNULA OR CATHETER FOR DRAINAGE OR DIALYSIS; PERMANENT;  Surgeon: Loney Hering, MD;  Location: MAIN OR [facility];  Service: Transplant   ??? PR REMV ART CLOT AXILL-BRACH,ARM INCIS Left 08/02/2020    Procedure: Embolect/Thrombec; Axilry/Brachial Art-Arm Incs;  Surgeon: Earney Mallet, MD;  Location: MAIN OR [facility];  Service: Vascular   ??? PR UPPER GI ENDOSCOPY,BIOPSY N/A 08/29/2017    Procedure: UGI ENDOSCOPY; WITH BIOPSY, SINGLE OR MULTIPLE;  Surgeon: Neysa Hotter, MD;  [facility]: GI PROCED[facility];  Service: Gastroenterology   ??? PR VEIN BYPASS GRAFT,SUBCL-BRACHIAL Left 08/05/2020    Procedure: Bypass Graft, With Vein; Subclavian-Brachial;  Surgeon: Earney Mallet, MD;  Location: MAIN OR [facility];  Service: Vascular   ??? SALPINGECTOMY Left 04/12/2000    Ectopic pregnancy   ??? SALPINGECTOMY Right 11/22/2015    at time of total vaginal hysterectomy   ??? TONSILECTOMY, ADENOIDECTOMY, BILATERAL MYRINGOTOMY AND TUBES     ??? TOTAL VAGINAL HYSTERECTOMY  11/22/2015 With R salpingectomy    Medications  has a current medication list which includes the following prescription(s): alteplase, sevelamer, adalimumab, allopurinol, amlodipine, apixaban, aspirin, atorvastatin, beclomethasone dipropionate, blood glucose control, normal, blood sugar diagnostic, blood-glucose meter, clobetasol, repatha sureclick, furosemide, gentamicin, isosorbide mononitrate, lancets, lancing device with lancets, methoxy peg-epoetin beta, sevelamer, and triamcinolone.   Tobacco/Alcohol History  Social History     Tobacco Use   Smoking Status Former Smoker   ??? Types: Cigarettes   ??? Quit date: 06/30/2010   ??? Years since quitting: 10.9  Smokeless Tobacco Never Used     Social History     Substance and Sexual Activity   Alcohol Use No    Social History        Occupational History   ??? Not on file       Home Address:  [address]  [place] [address] Family History  Family History   Adopted: Yes   Problem Relation Age of Onset   ??? Heart attack Mother 68   ??? No Known Problems Father    ??? No Known Problems Sister    ??? No Known Problems Daughter    ??? No Known Problems Maternal Grandmother    ??? No Known Problems Maternal Grandfather    ??? No Known Problems Paternal Grandmother    ??? No Known Problems Paternal Grandfather    ??? No Known Problems Brother    ??? No Known Problems Son    ??? No Known Problems Maternal Aunt    ??? No Known Problems Maternal Uncle    ??? No Known Problems Paternal Aunt    ??? No Known Problems Paternal Uncle    ??? No Known Problems Other    ??? Anesthesia problems Neg Hx    ??? Broken bones Neg Hx    ??? Cancer Neg Hx    ??? Clotting disorder Neg Hx    ??? Collagen disease Neg Hx    ??? Diabetes Neg Hx    ??? Dislocations Neg Hx    ??? Fibromyalgia Neg Hx    ??? Gout Neg Hx    ??? Hemophilia Neg Hx    ??? Osteoporosis Neg Hx    ??? Rheumatologic disease Neg Hx    ??? Scoliosis Neg Hx    ??? Severe sprains Neg Hx    ??? Sickle cell anemia Neg Hx    ??? Spinal Compression Fracture Neg Hx    ??? Melanoma Neg Hx    ??? Basal cell carcinoma Neg Hx    ??? Squamous cell carcinoma Neg Hx    ??? Breast cancer Neg Hx         Allergies   Cephalexin, Mango, Peach (prunus persica), Wellbutrin [bupropion hcl], Egg, Fish containing products, Lactase-rennet, Lexiscan [regadenoson], Mushroom, and Tomato (solanum lycopersicum)       Review of Systems  .   Marland Kitchen         OBJECTIVE:   Physical Exam:   DETAILED PHYSICAL EXAM  General Appearance ?? well-nourished and no acute distress   Mood and Affect ?? alert, cooperative and pleasant   Gait and Station ?? Smooth, heel-toe, non-antalgic gait   Cardiovascular ?? well-perfused distally and no swelling   Skin ?? Normal.   Respiratory ?? Non labored breathing   lymph ?? No lymphedema or adenopathy noted   Sensation ?? Sensation intact to light touch distally     Elbow Exam       Musculoskeletal Exam    Inspection Left   Skin  swelling of the dorsal forearm   Palpation    Tenderness Mid distal dorsal forearm, centrally but proximal to the wrist joint.   Crepitus None   Range of Motion    Flexion (passive) 160   Flexion (active) 160   Extension  lacks 10 degrees   Pronation normal   Supination  lacks 15 degrees   Strength    Flexion  5/5   Extension 5/5   Pronation  5/5   Supination  5/5   Special Tests  Lateral epicondyle pain with resisted supination Negative    Lateral epicondyle pain with resisted middle finger extension Negative    Lateral epicondyle pain with resisted wrist extension Negative    Medial epicondyle pain with resisted pronation Negative    Medial epicondyle pain with resisted wrist flexion  Negative    Instability    Generalized Laxity No   Varus stress No instability   Valgus stress  No instability       Test and Notes Review:    Imaging reviewed: Bedside ultrasound, previous vascular ultrasound.  Notes reviewed: Dr. Coralee Rud      This note was dictated using Dragon Dictation software.  Please excuse any spelling or grammatical errors.     MEDICAL DECISION MAKING (level of service defined by 2/3 elements) Number/Complexity of Problems Addressed 1 undiagnosed new problem with uncertain prognosis (99204/99214)   Amount/Complexity of Data to be Reviewed/Analyzed 3 points: Review prior notes (1 point per unique source); Review test results (1 point per unique test); Order tests (1 point per unique test); Assessment requiring an independent historian (99204/99214)   Risk of Complications/Morbidity/Mortality of Management --LOW Risk of Morbidity from Additional Diagnostic Testing or Treatment (99203/99213)--     TIME     Total Time for E/M Services on the Date of Encounter Time-based coding not utilized for this encounter     MODIFIER -25     Significant, Separately Billable Evaluation and Management No

## 2021-06-20 NOTE — Unmapped (Signed)
Thank you for coming to the [facility] and our clinic today!     We aim to provide you with the highest quality care.  If you need to schedule future appointments please call [phone].  If you need more direct help feel free to call our nurse line at [phone] or use MyChart to reach out to [place].    We look forward to seeing you again in the future and appreciate you coming to [facility].    Thank you.    Saunders Glance. Enis Slipper, MD            We provide innovative and comprehensive patient centered care that is supported by evidence-based research                                                                                                    COMING SOON!    Please check out our current research studies to see if you or someone you know may qualify at:    [email or web address]    .

## 2021-07-14 NOTE — Unmapped (Signed)
The [facility] has made a second and final attempt to reach this patient to refill the following medication:Repatha.      We have left voicemails on the following phone numbers: [phone] and have sent a MyChart message.    Dates contacted: 09/16,09/23  Last scheduled delivery: 08/26    The patient may be at risk of non-compliance with this medication. The patient should call the [facility] at [phone]  Option 4, then Option 2 (all other specialty patients) to refill medication.    Megan Robertson   [facility] Specialty Technician

## 2021-07-14 NOTE — Unmapped (Signed)
[facility] Refill Coordination Note    Specialty Medication(s) to be Shipped:   General Specialty: Repatha Sureclick 140mg /ml  Other medication(s) to be shipped: No additional medications requested for fill at this time     Megan Robertson, DOB: October 29, 1970  Phone: [phone] (home)     All above HIPAA information was verified with patient.     Was a Nurse, learning disability used for this call? No    Completed refill call assessment today to schedule patient's medication shipment from the [facility] [phone]).  All relevant notes have been reviewed.     Specialty medication(s) and dose(s) confirmed: Regimen is correct and unchanged.   Changes to medications: Megan Robertson reports no changes at this time.  Changes to insurance: No  New side effects reported not previously addressed with a pharmacist or physician: None reported  Questions for the pharmacist: No    Confirmed patient received a Conservation officer, historic buildings and a Surveyor, mining with first shipment. The patient will receive a drug information handout for each medication shipped and additional FDA Medication Guides as required.       DISEASE/MEDICATION-SPECIFIC INFORMATION        For patients on injectable medications: Patient currently has 0 doses left.  Next injection is scheduled for Unknown.    SPECIALTY MEDICATION ADHERENCE     Medication Adherence    Patient reported X missed doses in the last month: 0  Specialty Medication: Repatha Sureclick 140mg /ml  Patient is on additional specialty medications: No  Patient is on more than two specialty medications: No  Informant: patient  Reliability of informant: reliable  Reasons for non-adherence: no problems identified  Confirmed plan for next specialty medication refill: delivery by pharmacy  Refills needed for supportive medications: not needed        Were doses missed due to medication being on hold? No    Repatha 140mg /ml: 0 days of medicine on hand     REFERRAL TO PHARMACIST     Referral to the pharmacist: Not needed    [facility]     Shipping address confirmed in Epic.     Delivery Scheduled: Yes, Expected medication delivery date: 07/18/2021.     Medication will be delivered via Same Day Courier to the prescription address in Epic WAM.    Megan Robertson P Allena Katz   [facility] Specialty Technician

## 2021-07-18 MED FILL — REPATHA SURECLICK 140 MG/ML SUBCUTANEOUS PEN INJECTOR: SUBCUTANEOUS | 28 days supply | Qty: 2 | Fill #3

## 2021-07-27 ENCOUNTER — Ambulatory Visit: Admit: 2021-07-27 | Discharge: 2021-07-28 | Payer: MEDICARE

## 2021-07-27 DIAGNOSIS — G4733 Obstructive sleep apnea (adult) (pediatric): Principal | ICD-10-CM

## 2021-07-27 DIAGNOSIS — L299 Pruritus, unspecified: Principal | ICD-10-CM

## 2021-07-27 DIAGNOSIS — G2581 Restless legs syndrome: Principal | ICD-10-CM

## 2021-07-27 NOTE — Unmapped (Signed)
CLINIC NOTE   Sleep Clinic, Neurology  [facility]      NEW CONSULT/ EVALUATION  Date of Service: 07/27/2021  Patient Name: Megan Robertson     MRN: [id number]      Date of Birth: [birth year]  Primary Care Physician: [facility]   Referring Provider: Ardeth Sportsman, Md  [address]  [place],  [place] [address]    Assessment and Plan:   Impression:   Megan Robertson is a 50 y.o. female with a past medical history of OSA, ESRD on PD, and HTN seen for initial consultation at the request of  Ardeth Sportsman for an opinion regarding potential etiologies, diagnostic work-up that should be considered, and/or recommendations for treatment options for OSA.    [name] presents with a prior diagnosis of OSA, but was never treated.  In addition she has symptoms of RLS and a delayed sleep phase.  I discussed the pathophysiology of sleep apnea including increased risk of stroke, heart attack, and death from all causes.  Also discussed the association between sleep apnea and the development of dementia.  I also spoke regarding treatment options including PAP therapy, oral appliance, and surgeries.  I recommend PAP therapy as it is the most effective and least invasive if tolerated. She also has symptoms consistent with RLS, which is common in patients with ESRD.  I recommended that she have her iron and ferritin checked.  I have also asked her to check with her nephrologist about possibly starting low dose gabapentin. She is on PD daily, so I assume she would have removal of gabapentin without toxic buildup because of this.  Finally, she has a very delayed sleep phase and I recommended measures to advance her phase.    Plan:    1. OSA (obstructive sleep apnea)  - PSG  2. RLS  - Check iron panel and ferritin  - Check with neurologist about low dose gabapentin  3. Delayed sleep phase  - Bright light exposure at noon-1pm  - Blue light blocking glasses after 8pm  - Take 1-3mg  of melatonin at 6pm        Today's visit consisted of  60 minutes of  face to face time with the patient. I spent an additional 10 minutes on pre- and post-visit activities.     Subjective:     Chief Complaint: OSA    History of Present Illness:    Megan Robertson is a 50 y.o. female with a past medical history of HTN, OSA seen for initial consultation at the request of Ardeth Sportsman for an opinion regarding potential etiologies, diagnostic work-up that should be considered, and/or recommendations for treatment options for OSA.      Megan Robertson presents with her husband for OSA.  She was diagnosed with OSA a few years ago, but do to insurance issues was unable to get a CPAP.  She reports difficulty with breathing at night and wakes up choking and gasping. She reports RLS symptoms as well.      Megan Robertson eats dinner at various times and can eat even around 1am. The patient does not continue eating, but does cotinue drinking prior to bed. The hour before bed is spent watching TV or playing on her phone.   The patient goes to bed anywhere from 11:30 to 3am and falls asleep in minutes.  Once asleep, the patient does wake up 3-4 times and has no problem falling back  asleep. The patient does have nocturia 3-4 times per night.  The patient wakes up at 2-3pm and does sometime feel refreshed. On weekends, the sleep schedule is the same.  The patient does sometimes feel sleepy/fatigued during the day.  The patients does not nap during the day.    Patient reports no history of motor vehicle accidents or near misses.       Megan Robertson does use caffeine products of: 4-5 caffeinated sodas.  The patient does not use tobacco products.   The patient does not drink alcohol products.   The patient does no exercise.   Preferred time to sleep is 8pm-8am. States 12 hours of sleep are required to feel rested.     Megan Robertson does snore and have witnessed apnea spells. The patient does not have headaches, but dry mouth in the morning.  The patient does not have sleep walking, cataplexy, sleep paralysis, hypnogogic and/or hypnopompic hallucinations. She does report sleep talking.  She has had a few episodes of dream enactment.  Denies symptoms of GERD, congestion.  She does have coughing and night sweats.    The sleep environment is dark,  is cool, and is quiet. Room is shared with husband. There is a TV in the bedroom.         History obtained from source other than patient: husband    Epworth Sleepiness Scale  How likely are you to doze off or fall asleep in the following situations, in contrast to feeling just tired? This refers to your usual way of life in recent times. Even if you have not done some of these things recently try to work out how they would have affected you. Use the following scale to choose the most appropriate number for each situation:  0 = would never doze or sleep.  1 = slight chance of dozing or sleeping  2 = moderate chance of dozing or sleeping  3 = high chance of dozing or sleeping     Situation      Chance of Dozing or Sleeping    Sitting and reading 1   Watching TV 3   Sitting inactive in a public place 0   Being a passenger in a motor vehicle for an hour or more 3   Lying down in the afternoon  1   Sitting and talking to someone  1   Sitting quietly after lunch (no alcohol) 3   Stopped for a few minutes in traffic   while driving  1   Total score   13     Past Medical History:   Diagnosis Date   ??? Abnormal mammogram    ??? Abnormal Pap smear of cervix    ??? Anemia    ??? Anxiety    ??? Arthritis    ??? Asthma    ??? Back pain    ??? Cancer (CMS-HCC) Ovarian   ??? Depression    ??? Diabetes mellitus (CMS-HCC) Borderline   ??? ESRD (end stage renal disease) on dialysis (CMS-HCC)    ??? FSGS (focal segmental glomerulosclerosis) 1997    renal biopsy at [facility]   ??? GERD (gastroesophageal reflux disease)    ??? Gout    ??? H/O adenoidectomy     had adenoids removed   ??? Hypercholesteremia    ??? Hypertension    ??? Ovarian cancer (CMS-HCC)    ??? Pancreatitis    ??? Seizure (CMS-HCC)     unknown etiology; none for several years   ???  Stroke (CMS-HCC) 2000       Family History   Adopted: Yes   Problem Relation Age of Onset   ??? Heart attack Mother 43   ??? Alcohol abuse Mother    ??? No Known Problems Father    ??? No Known Problems Sister    ??? No Known Problems Daughter    ??? No Known Problems Maternal Grandmother    ??? No Known Problems Maternal Grandfather    ??? No Known Problems Paternal Grandmother    ??? No Known Problems Paternal Grandfather    ??? No Known Problems Brother    ??? No Known Problems Son    ??? No Known Problems Maternal Aunt    ??? No Known Problems Maternal Uncle    ??? No Known Problems Paternal Aunt    ??? No Known Problems Paternal Uncle    ??? No Known Problems Other    ??? Anesthesia problems Neg Hx    ??? Broken bones Neg Hx    ??? Cancer Neg Hx    ??? Clotting disorder Neg Hx    ??? Collagen disease Neg Hx    ??? Diabetes Neg Hx    ??? Dislocations Neg Hx    ??? Fibromyalgia Neg Hx    ??? Gout Neg Hx    ??? Hemophilia Neg Hx    ??? Osteoporosis Neg Hx    ??? Rheumatologic disease Neg Hx    ??? Scoliosis Neg Hx    ??? Severe sprains Neg Hx    ??? Sickle cell anemia Neg Hx    ??? Spinal Compression Fracture Neg Hx    ??? Melanoma Neg Hx    ??? Basal cell carcinoma Neg Hx    ??? Squamous cell carcinoma Neg Hx    ??? Breast cancer Neg Hx             Current Outpatient Medications on File Prior to Visit   Medication Sig Dispense Refill   ??? adalimumab (HUMIRA) 40 mg/0.8 mL subcutaneous pen kit Inject the contents of 2 pens (80mg ) under the skin once for initial dose, then inject 1 pen (40mg ) every 2 weeks. 6 each 3   ??? allopurinoL (ZYLOPRIM) 100 MG tablet Take 1 tablet (100 mg total) by mouth Every Tuesday, Thursday and Saturday. 36 tablet 3   ??? amLODIPine (NORVASC) 10 MG tablet Take 1 tablet (10 mg total) by mouth daily. 90 tablet 3   ??? apixaban (ELIQUIS) 5 mg Tab Take 5 mg by mouth daily.     ??? atorvastatin (LIPITOR) 80 MG tablet Take 1 tablet (80 mg total) by mouth daily. 30 tablet 0   ??? beclomethasone dipropionate (QVAR REDIHALER) 80 mcg/actuation inhaler 1 puff every morning, 2 puffs every evening (Patient taking differently: continuous as needed. 1 puff every morning, 2 puffs every evening as needed) 10.6 g 0   ??? clobetasoL (TEMOVATE) 0.05 % ointment Place ointment on outside of vulva nightly for 2 weeks and then twice a week thereafter. 45 g 0   ??? evolocumab (REPATHA SURECLICK) 140 mg/mL PnIj Inject the contents of 1 pen (140 mg) under the skin every fourteen (14) days. 6 mL 3   ??? furosemide (LASIX) 40 MG tablet Take 1 tablet (40 mg total) by mouth 3 (three) times a week. To be taken once a day if you develop leg swelling. 36 tablet 3   ??? gentamicin (GARAMYCIN) 0.1 % cream APPLY A SMALL AMOUNT  TO EXIT SITE DAILY WITH DRESSING CHANGE     ???  isosorbide mononitrate (IMDUR) 120 MG 24 hr tablet Take 1 tablet (120 mg total) by mouth daily. 30 tablet 0   ??? sevelamer (RENVELA) 800 mg tablet TAKE 1 TABLET BY MOUTH THREE TIMES A DAY WITH MEALS     ??? triamcinolone (KENALOG) 0.1 % ointment APPLY  TOPICALLY TWICE DAILY TO  AFFECTED  AREAS (Patient taking differently: APPLY  TOPICALLY TWICE DAILY TO  AFFECTED  AREAS) 454 g 0   ??? ALTEPLASE INJ 2 mg. (Patient not taking: Reported on 07/27/2021)     ??? aspirin 81 MG chewable tablet Chew 1 tablet (81 mg total) daily. 36 tablet 0   ??? blood glucose control, normal (ACCU-CHEK SMARTVIEW CONTRL SOL) Soln 1 Bottle by Miscellaneous route once as needed. for up to 1 dose Use with each new box of test strips and as instructed 1 each 2   ??? blood sugar diagnostic (ACCU-CHEK SMARTVIEW TEST STRIP) Strp by Other route every morning before breakfast. For use with glucometer to check blood sugar daily 50 strip 5   ??? blood-glucose meter (ACCU-CHEK NANO) Misc 1 kit by Miscellaneous route every morning before breakfast. To check glucose level daily 1 each 0   ??? lancets (ACCU-CHEK FASTCLIX LANCET DRUM) Misc 1 Units by Miscellaneous route every morning before breakfast. 102 each 5   ??? lancing device with lancets (ACCU-CHEK FASTCLIX LANCING DEV) Kit 1 kit by Miscellaneous route every morning before breakfast. With glucometer/lancets to check blood sugar daily 1 each 1   ??? methoxy peg-epoetin beta (MIRCERA INJ) 75 mcg. (Patient not taking: Reported on 07/27/2021)     ??? sevelamer (RENVELA) 800 mg tablet Take 1 tablet by mouth.       No current facility-administered medications on file prior to visit.       Allergies   Allergen Reactions   ??? Cephalexin Itching     unknown; tolerates cefepime   ??? Mango Itching   ??? Peach (Prunus Persica) Itching   ??? Wellbutrin [Bupropion Hcl] Other (See Comments)     Seizure   ??? Egg Itching   ??? Fish Containing Products Itching   ??? Lactase-Rennet Nausea And Vomiting   ??? Lexiscan [Regadenoson] Nausea And Vomiting     Patient had nausea after the Lexi then vomitted [id number]   ??? Mushroom Itching   ??? Tomato (Solanum Lycopersicum) Itching       Review of Systems:  A 12 point review of systems was negative except for pertinent items noted in the HPI.      The problem list was reviewed today.     Objective:     Physical Exam:     Blood pressure 115/83, pulse 86, resp. rate 18, height 157.5 cm (5' 2.01), weight 89.1 kg (196 lb 7 oz), not currently breastfeeding.   Vitals:    07/27/21 0827   Weight: 89.1 kg (196 lb 7 oz)     Body mass index is 35.92 kg/m??.       Prior PSG Results:        Data/ Lab/ Inventory Review:   Polysomnography: See above  History obtained from source other than patient:   Blood/ serum laboratory tests ordered as below:   Lab Results   Component Value Date    FERRITIN 346.8 (H) 10/24/2020     Lab Results   Component Value Date    IRON 39 (L) 10/24/2020    TIBC 195 (L) 10/24/2020    FERRITIN 346.8 (H) 10/24/2020     Lab Results  Component Value Date    TSH 1.812 07/30/2020     Lab Results   Component Value Date    VITAMINB12 >2,000 (H) 08/01/2020           Author:  Nilsa Nutting 07/27/2021 8:34 AM

## 2021-07-27 NOTE — Unmapped (Addendum)
We need to repeat a sleep study. Call the sleep lab at [phone].  Get exposure to bright light around noon-1pm, preferably with a walk outside, and then again in the afternoon.  Wear blue light blocking glasses after 8pm. Skyper UVEX is a good brand.  Take 1-3mg  of melatonin at 6pm. [employer], [employer], [employer], or Natrol are the best brands.  Avoid eating and drinking within 3 hours of bedtime.   Get your iron and ferritin.  Talk to your nephrologist about trying gabapentin at low dose for restless leg symptoms.

## 2021-08-10 ENCOUNTER — Ambulatory Visit: Admit: 2021-08-10 | Discharge: 2021-08-11 | Payer: MEDICARE

## 2021-08-10 MED ORDER — APIXABAN 5 MG TABLET
ORAL_TABLET | Freq: Every day | ORAL | 0 refills | 30 days | Status: CP
Start: 2021-08-10 — End: ?

## 2021-08-10 NOTE — Unmapped (Addendum)
Squeeze stress ball with L hand during commercial breaks  Eliquis refill - ask about if this medication is needed forever at your next Vascular Surgery visit  Stop eliquis 4 days before colonoscopy  Return disability placard application

## 2021-08-10 NOTE — Unmapped (Signed)
[facility]      ASSESSMENT/PLAN:    1. 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 Dr. Elisabeth Most note from 11/12 strongly recommends indefinite anticoagulation. Will give refill for a month, has vascular surgery appt next month.  - eliquis refill    3. Need for shingles vaccine  Declines shingles vaccine  Also declines flu and covid  Continue to discuss at future visits    4. 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 Follow-up       HPI: Megan Robertson is a 50 y.o. female who presents to clinic today for the following issues:    Legs still numb, especially after sitting a lot time  Pulling/stretching sensation in medial thighs; not a cramping or soreness  Still disappointed in decreased function of L hand after bypass in L upper arm  OT two office visits, not very helpful to her  L arm cramping and weakness    OBJECTIVE:  BP 107/86 (BP Site: R Arm, BP Position: Sitting, BP Cuff Size: Large)  - Pulse 104  - Temp 36.3 ??C (97.3 ??F) (Temporal)  - Ht 157.5 cm (5' 2.01)  - Wt 87.8 kg (193 lb 9.6 oz)  - LMP  (LMP Unknown)  - BMI 35.40 kg/m??       Physical Exam  Constitutional:       General: She is not in acute distress.  Cardiovascular:      Rate and Rhythm: Normal rate and regular rhythm.      Comments: No palpable thrill over L upper arm  Pulmonary:      Effort: Pulmonary effort is normal.      Breath sounds: Normal breath sounds.   Abdominal:      Comments: No abdominal tenderness; PD site without redness or tenderness   Musculoskeletal:      Comments: 4/5 grip strength on L, 5/5 on R   Skin:     Comments: Well-healed surgical site over L upper arm             Tollie Eth, MD   [facility], PGY2        [facility]  [address], [place], [place] [address]   ??? Telephone [phone] ??? Fax [phone]  [email or web address]

## 2021-08-14 NOTE — Unmapped (Signed)
The [facility] has made a second and final attempt to reach this patient to refill the following medication:Repatha.      We have left voicemails on the following phone numbers: [phone].    Dates contacted: 08/10/21, 08/14/21  Last scheduled delivery: 07/18/21    The patient may be at risk of non-compliance with this medication. The patient should call the [facility] at [phone]  Option 4, then Option 2 (all other specialty patients) to refill medication.    Kayhan Boardley Celedonio Savage   [facility]

## 2021-08-15 NOTE — Unmapped (Signed)
Called, no answer  Sent mychart message    Thanks!

## 2021-08-16 NOTE — Unmapped (Signed)
Immediately after or during the visit, I reviewed with the resident the medical history and the resident???s findings on physical examination.  I discussed with the resident the patient???s diagnosis and concur with the treatment plan as documented in the resident note. Nestor Ramp, MD

## 2021-08-17 NOTE — Unmapped (Signed)
[facility] Refill Coordination Note    Specialty Medication(s) to be Shipped:   General Specialty: Repatha Sureclick 140mg /ml     Other medication(s) to be shipped: No additional medications requested for fill at this time     Megan Robertson, DOB: 1971-09-16  Phone: [phone] (home)     All above HIPAA information was verified with patient.     Was a Nurse, learning disability used for this call? No    Completed refill call assessment today to schedule patient's medication shipment from the [facility] [phone]).  All relevant notes have been reviewed.     Specialty medication(s) and dose(s) confirmed: Regimen is correct and unchanged.   Changes to medications: [name] reports no changes at this time.  Changes to insurance: No  New side effects reported not previously addressed with a pharmacist or physician: None reported  Questions for the pharmacist: No    Confirmed patient received a Conservation officer, historic buildings and a Surveyor, mining with first shipment. The patient will receive a drug information handout for each medication shipped and additional FDA Medication Guides as required.       DISEASE/MEDICATION-SPECIFIC INFORMATION        For patients on injectable medications: Patient currently has 1 doses left.  Next injection is scheduled for 08/17/2021.    SPECIALTY MEDICATION ADHERENCE     Medication Adherence    Patient reported X missed doses in the last month: 0  Specialty Medication: Repatha  Patient is on additional specialty medications: No  Any gaps in refill history greater than 2 weeks in the last 3 months: no  Demonstrates understanding of importance of adherence: yes  Informant: patient  Reliability of informant: reliable  Confirmed plan for next specialty medication refill: delivery by pharmacy  Refills needed for supportive medications: not needed        Were doses missed due to medication being on hold? No    Repatha 140mg /ml: 14 days of medicine on hand REFERRAL TO PHARMACIST     Referral to the pharmacist: Not needed    [facility]     Shipping address confirmed in Epic.     Delivery Scheduled: Yes, Expected medication delivery date: 08/18/2021.     Medication will be delivered via Same Day Courier to the prescription address in Epic WAM.    Garald Rhew D Karan Ramnauth   [facility] Specialty Technician

## 2021-08-18 MED FILL — REPATHA SURECLICK 140 MG/ML SUBCUTANEOUS PEN INJECTOR: SUBCUTANEOUS | 28 days supply | Qty: 2 | Fill #4

## 2021-08-22 MED FILL — PEG 3350-ELECTROLYTES 236 GRAM-22.74 GRAM-6.74 GRAM-5.86 GRAM SOLUTION: ORAL | 1 days supply | Qty: 4000 | Fill #0

## 2021-08-23 NOTE — Unmapped (Signed)
Patient Advice Request     Patient Name: Megan Robertson  Caller: Self (Patient)  Contact Method: Telephone Call: Time- Any Time [phone]   Reason for Call: Patient called and advised that DHB31 form for patient to receive certified nursing assistance has not been faxed to [facility]. Patient advised that form was left 3 months ago, but Dr. Jennye Moccasin advised pt that it was faxed during last visit on 08/10/2021. If new form is needed provider can go to [email or web address] click the second link Medicaid [employer] and print the DHB31 form. The form is 3 pages and pcp would need to complete pages 1 & 2 and then fax to [phone] no attention needed.    Previously Discussed: yes  Appointment Offered: Yes

## 2021-08-27 MED ORDER — AMLODIPINE 10 MG TABLET
ORAL_TABLET | Freq: Every day | ORAL | 3 refills | 90 days
Start: 2021-08-27 — End: 2022-08-27

## 2021-08-28 MED ORDER — AMLODIPINE 10 MG TABLET
ORAL_TABLET | Freq: Every day | ORAL | 0 refills | 90.00000 days
Start: 2021-08-28 — End: 2021-11-26

## 2021-08-31 MED ORDER — AMLODIPINE 10 MG TABLET
ORAL_TABLET | Freq: Every day | ORAL | 0 refills | 90.00000 days | Status: CP
Start: 2021-08-31 — End: 2021-11-29
  Filled 2021-09-01: qty 90, 90d supply, fill #0

## 2021-09-06 ENCOUNTER — Ambulatory Visit: Admit: 2021-09-06 | Discharge: 2021-09-06 | Payer: MEDICARE

## 2021-09-06 ENCOUNTER — Ambulatory Visit: Admit: 2021-09-06 | Discharge: 2021-09-06 | Payer: MEDICARE | Attending: Vascular Surgery | Primary: Vascular Surgery

## 2021-09-06 DIAGNOSIS — I70228 Atherosclerosis of native arteries of extremities with rest pain, other extremity: Principal | ICD-10-CM

## 2021-09-07 NOTE — Unmapped (Signed)
Visit date:  09/06/21  ??  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  She is doing well and states her arm is getting better.  We will follow-up with Ms. Hemrick in 6 months with repeat LUE arterial duplex.  She should continue taking daily eliquis.       HPI: Megan Robertson is a 50 y.o. female with a history of??asthma, FSGS, GERD, gout, HLD, HTN and ESRD on dialysis. ??She underwent left??brachio-cephalic??vein AVF??(01/29/20) which thrombosed prior to maturation and underwent left brachiobasilic AVG??on 06/08/20 with Dr. Norma Fredrickson which also failed and was never used. ??Since the brachiobasilic AVG was created in??August, she??experienced??left arm pain, numbness in the hand, and some weakness of the left upper extremity. These symptoms initially had some improvement with grip strengh exercises.????She presented to the hospital on 08/02/20 after having severe pain, cold fingertips, paresthesias and decreased LUE pulses with arterial duplex demonstrating occlusion of mid/distal brachial artery??and no flow in distal radial artery or ulnar artery.????On 10/12 she underwent surgical exposure of left brachial artery??and thromboembolectomy of left brachial artery and ulnar artery. ??On 10/15 she underwent left subclavian to brachial artery bypass using reversed GSV??with right lower extremity GSV harvest. ??She developed leukocytosis with CT imaging demonstrating a 3 cm collection with air in the upper arm near the vein graft, and on 10/24 she underwent LUE wound exploration and washout, excisional debridement of subcutaneous tissue and wound vac placement. ??She finished a 14 day course of antibiotics on 08/27/20. ??She was last seen in clinic on??01/25/21 with duplex findings of patent bypass with possible obstruction distal to the bypass graft with dampened flow in the radial and ulnar arteries. ??On 02/09/21 she underwent LUE angiogram with PTA of the distal anastomosis of the left subclavian to brachial bypass and PTA of the left brachial artery.  She was last seen in clinic on 06/14/21 and presents today for follow-up.     Megan Robertson presents to the clinic today with her husband.  She denies changes in her medical/surgical history since her last visit.  She states her left arm is getting better.  She continues to have decreased sensation, cramping and paresthesias in her LUE.  She denies any wounds on her arms.  She denies pain in her left hand/wrist.  She denies chest pain. She gets short of breath when laying down.  She is on peritoneal dialysis.  She continues to take daily eliquis.        Allergies   Allergen Reactions   ??? Cephalexin Itching     unknown; tolerates cefepime   ??? Mango Itching   ??? Peach (Prunus Persica) Itching   ??? Wellbutrin [Bupropion Hcl] Other (See Comments)     Seizure   ??? Egg Itching   ??? Fish Containing Products Itching   ???  Lactase-Rennet Nausea And Vomiting   ??? Lexiscan [Regadenoson] Nausea And Vomiting     Patient had nausea after the Lexi then vomitted [id number]   ??? Mushroom Itching   ??? Tomato (Solanum Lycopersicum) Itching       Current Outpatient Medications   Medication Sig Dispense Refill   ??? adalimumab (HUMIRA) 40 mg/0.8 mL subcutaneous pen kit Inject the contents of 2 pens (80mg ) under the skin once for initial dose, then inject 1 pen (40mg ) every 2 weeks. 6 each 3   ??? allopurinoL (ZYLOPRIM) 100 MG tablet Take 1 tablet (100 mg total) by mouth Every Tuesday, Thursday and Saturday. 36 tablet 3   ??? ALTEPLASE INJ 2 mg.     ??? amLODIPine (NORVASC) 10 MG tablet Take 1 tablet (10 mg total) by mouth daily. 90 tablet 0   ??? apixaban (ELIQUIS) 5 mg Tab Take 1 tablet (5 mg total) by mouth in the morning. 30 tablet 0   ??? atorvastatin (LIPITOR) 80 MG tablet Take 1 tablet (80 mg total) by mouth daily. 30 tablet 0   ??? beclomethasone dipropionate (QVAR REDIHALER) 80 mcg/actuation inhaler 1 puff every morning, 2 puffs every evening (Patient taking differently: continuous as needed. 1 puff every morning, 2 puffs every evening as needed) 10.6 g 0   ??? blood glucose control, normal (ACCU-CHEK SMARTVIEW CONTRL SOL) Soln 1 Bottle by Miscellaneous route once as needed. for up to 1 dose Use with each new box of test strips and as instructed 1 each 2   ??? blood sugar diagnostic (ACCU-CHEK SMARTVIEW TEST STRIP) Strp by Other route every morning before breakfast. For use with glucometer to check blood sugar daily 50 strip 5   ??? blood-glucose meter (ACCU-CHEK NANO) Misc 1 kit by Miscellaneous route every morning before breakfast. To check glucose level daily 1 each 0   ??? clobetasoL (TEMOVATE) 0.05 % ointment Place ointment on outside of vulva nightly for 2 weeks and then twice a week thereafter. 45 g 0   ??? evolocumab (REPATHA SURECLICK) 140 mg/mL PnIj Inject the contents of 1 pen (140 mg) under the skin every fourteen (14) days. 6 mL 3   ??? furosemide (LASIX) 40 MG tablet Take 1 tablet (40 mg total) by mouth 3 (three) times a week. To be taken once a day if you develop leg swelling. 36 tablet 3   ??? gentamicin (GARAMYCIN) 0.1 % cream APPLY A SMALL AMOUNT  TO EXIT SITE DAILY WITH DRESSING CHANGE     ??? isosorbide mononitrate (IMDUR) 120 MG 24 hr tablet Take 1 tablet (120 mg total) by mouth daily. 30 tablet 0   ??? lancets (ACCU-CHEK FASTCLIX LANCET DRUM) Misc 1 Units by Miscellaneous route every morning before breakfast. 102 each 5   ??? lancing device with lancets (ACCU-CHEK FASTCLIX LANCING DEV) Kit 1 kit by Miscellaneous route every morning before breakfast. With glucometer/lancets to check blood sugar daily 1 each 1   ??? methoxy peg-epoetin beta (MIRCERA INJ) 75 mcg.     ??? sevelamer (RENVELA) 800 mg tablet TAKE 1 TABLET BY MOUTH THREE TIMES A DAY WITH MEALS     ??? sevelamer (RENVELA) 800 mg tablet Take 1 tablet by mouth.     ??? triamcinolone (KENALOG) 0.1 % ointment APPLY  TOPICALLY TWICE DAILY TO  AFFECTED  AREAS (Patient taking differently: APPLY  TOPICALLY TWICE DAILY TO  AFFECTED  AREAS) 454 g 0   ??? aspirin 81 MG chewable tablet Chew 1 tablet (81 mg total) daily. 36 tablet 0  No current facility-administered medications for this visit.       Past Medical History:   Diagnosis Date   ??? Abnormal mammogram    ??? Abnormal Pap smear of cervix    ??? Anemia    ??? Anxiety    ??? Arthritis    ??? Asthma    ??? Back pain    ??? Cancer (CMS-HCC) Ovarian   ??? Depression    ??? Diabetes mellitus (CMS-HCC) Borderline   ??? ESRD (end stage renal disease) on dialysis (CMS-HCC)    ??? FSGS (focal segmental glomerulosclerosis) 1997    renal biopsy at [facility]   ??? GERD (gastroesophageal reflux disease)    ??? Gout    ??? H/O adenoidectomy     had adenoids removed   ??? Hypercholesteremia    ??? Hypertension    ??? Ovarian cancer (CMS-HCC)    ??? Pancreatitis    ??? Seizure (CMS-HCC)     unknown etiology; none for several years   ??? Stroke (CMS-HCC) 2000       Past Surgical History:   Procedure Laterality Date   ??? APPENDECTOMY     ??? BREAST EXCISIONAL BIOPSY Right 11/28/2016   ??? CHEMOTHERAPY      for ovarian CA in 2000   ??? CHG US GUIDE, VASCULAR ACCESS N/A 01/29/2020    Procedure: ULTRASOUND GUIDANCE FOR VASC ACCESS REQUIRING [place] EVAL OF POTENTIAL ACCESS SITES;  Surgeon: Leona Carry, MD;  Location: MAIN OR [facility]; Service: Transplant   ??? CHG US GUIDE, VASCULAR ACCESS N/A 06/08/2020    Procedure: ULTRASOUND GUIDANCE FOR VASC ACCESS REQUIRING [place] EVAL OF POTENTIAL ACCESS SITES;  Surgeon: Leona Carry, MD;  Location: MAIN OR [facility];  Service: Transplant   ??? CHOLECYSTECTOMY     ??? COMBINED HYSTEROSCOPY DIAGNOSTIC / D&C  07/07/2015    Planned for endometrial ablation, but patient had uterine perforation after D&C and unable to perform   ??? HYSTERECTOMY     ??? LEFT OOPHORECTOMY Left 04/12/2000    Removed with L fallopian tube for ectopic pregnancy   ??? OOPHORECTOMY     ??? PR CREAT AV FISTULA,AUTOGENOUS GRAFT Left 01/29/2020    Procedure: Create Av Fistula (Separt Proc); Verner Mould;  Surgeon: Leona Carry, MD;  Location: MAIN OR [facility];  Service: Transplant   ??? PR CREAT AV FISTULA,NON-AUTOGENOUS GRAFT Left 06/08/2020    Procedure: CREATE AV FISTULA (SEPARATE PROC); NONAUTOGENOUS GRAFT (EG, BIOLOGICAL COLLAGEN, THERMOPLASTIC GRAFT);  Surgeon: Leona Carry, MD;  Location: MAIN OR [facility];  Service: Transplant   ??? PR EXPLORATION N/FLWD SURG UPPER EXTREMITY ARTERY Left 08/02/2020    Procedure: Exploration Not Followed By Surgical Repair, Artery; Upper Extremity (Eg, Axillary, Brachial, Radial, Ulnar);  Surgeon: Earney Mallet, MD;  Location: MAIN OR [facility];  Service: Vascular   ??? PR INCIS/DRAIN ARM,DEEP ABSC/HEMATOMA Left 08/14/2020    Procedure: INCISION AND DRAINAGE, UPPER ARM OR ELBOW AREA; DEEP ABSCESS OR HEMATOMA;  Surgeon: Earney Mallet, MD;  Location: MAIN OR [facility];  Service: Vascular   ??? PR INSERTION TUNNEL INTRAPERITONEAL CATH DIAL OPEN Midline 02/01/2021    Procedure: INSERTION OF INTRAPERITONEAL CANNULA OR CATHETER FOR DRAINAGE OR DIALYSIS; PERMANENT;  Surgeon: Loney Hering, MD;  Location: MAIN OR [facility];  Service: Transplant   ??? PR REMV ART CLOT AXILL-BRACH,ARM INCIS Left 08/02/2020    Procedure: Embolect/Thrombec; Axilry/Brachial Art-Arm Incs;  Surgeon: Earney Mallet, MD; Location: MAIN OR [facility];  Service: Vascular   ??? PR UPPER GI ENDOSCOPY,BIOPSY N/A 08/29/2017  Procedure: UGI ENDOSCOPY; WITH BIOPSY, SINGLE OR MULTIPLE;  Surgeon: Neysa Hotter, MD;  Location: GI PROCEDURES [facility];  Service: Gastroenterology   ??? PR VEIN BYPASS GRAFT,SUBCL-BRACHIAL Left 08/05/2020    Procedure: Bypass Graft, With Vein; Subclavian-Brachial;  Surgeon: Earney Mallet, MD;  Location: MAIN OR [facility];  Service: Vascular   ??? SALPINGECTOMY Left 04/12/2000    Ectopic pregnancy   ??? SALPINGECTOMY Right 11/22/2015    at time of total vaginal hysterectomy   ??? TONSILECTOMY, ADENOIDECTOMY, BILATERAL MYRINGOTOMY AND TUBES     ??? TOTAL VAGINAL HYSTERECTOMY  11/22/2015    With R salpingectomy        Social History     Socioeconomic History   ??? Marital status: Married   Tobacco Use   ??? Smoking status: Former     Types: Cigarettes     Quit date: 06/30/2010     Years since quitting: 11.1   ??? Smokeless tobacco: Never   Vaping Use   ??? Vaping Use: Never used   Substance and Sexual Activity   ??? Alcohol use: No   ??? Drug use: No   ??? Sexual activity: Yes     Partners: Male     Birth control/protection: Bilateral Tubal Ligation   Other Topics Concern   ??? Do you use sunscreen? No   ??? Tanning bed use? No   ??? Are you easily burned? No   ??? Excessive sun exposure? No   ??? Blistering sunburns? No   Social History Narrative    on disability since 1994, but does not qualify anymore since she got married and her husband has income.     Lives in [place] with husband, Lisbeth Ply and 2 children       Family History   Adopted: Yes   Problem Relation Age of Onset   ??? Heart attack Mother 41   ??? Alcohol abuse Mother    ??? Mental illness Mother    ??? No Known Problems Father    ??? No Known Problems Sister    ??? No Known Problems Daughter    ??? No Known Problems Maternal Grandmother    ??? No Known Problems Maternal Grandfather    ??? No Known Problems Paternal Grandmother    ??? No Known Problems Paternal Grandfather    ??? No Known Problems Brother    ??? No Known Problems Son    ??? No Known Problems Maternal Aunt    ??? No Known Problems Maternal Uncle    ??? No Known Problems Paternal Aunt    ??? No Known Problems Paternal Uncle    ??? No Known Problems Other    ??? Anesthesia problems Neg Hx    ??? Broken bones Neg Hx    ??? Cancer Neg Hx    ??? Clotting disorder Neg Hx    ??? Collagen disease Neg Hx    ??? Diabetes Neg Hx    ??? Dislocations Neg Hx    ??? Fibromyalgia Neg Hx    ??? Gout Neg Hx    ??? Hemophilia Neg Hx    ??? Osteoporosis Neg Hx    ??? Rheumatologic disease Neg Hx    ??? Scoliosis Neg Hx    ??? Severe sprains Neg Hx    ??? Sickle cell anemia Neg Hx    ??? Spinal Compression Fracture Neg Hx    ??? Melanoma Neg Hx    ??? Basal cell carcinoma Neg Hx    ??? Squamous cell carcinoma Neg Hx    ???  Breast cancer Neg Hx        ROS:  A 10 system ROS was reviewed and was negative aside from that mentioned in the HPI.      PE:    Vitals:    09/06/21 1255   BP: 117/79   Pulse: 85   Temp: 36.7 ??C (98 ??F)       General: WD, WN well-appearing female in NAD.    Cardiovascular:  RRR, S1, S2.  No murmurs, gallops or rubs.     Lungs: CTAB.  Respirations even, nonlabored.  No wheezes, crackles, rhonchi..    Musculoskeletal:  No clubbing, cyanosis or edema.      Neurological: Alert and oriented x 4.      Psych/Mental Health:  Appropriate affect.    ??  Imaging:    09/07/21 LUE arterial duplex: these images were reviewed by Dr. Coralee Rud   Final Interpretation     Left: Patent subclavian to mid brachial BPG - 50-74% stenosis in        distal brachial artery - Dampened flow in the distal radial        and ulnar arteries - Diminished PPG tacings in fingers one and        four.

## 2021-09-08 NOTE — Unmapped (Signed)
[facility] Refill Coordination Note    Specialty Medication(s) to be Shipped:   General Specialty: Repatha    Other medication(s) to be shipped: No additional medications requested for fill at this time     Megan Robertson, DOB: Oct 14, 1971  Phone: [phone] (home)       All above HIPAA information was verified with patient.     Was a Nurse, learning disability used for this call? No    Completed refill call assessment today to schedule patient's medication shipment from the [facility] [phone]).  All relevant notes have been reviewed.     Specialty medication(s) and dose(s) confirmed: Regimen is correct and unchanged.   Changes to medications: Toshi reports no changes at this time.  Changes to insurance: No  New side effects reported not previously addressed with a pharmacist or physician: None reported  Questions for the pharmacist: No    Confirmed patient received a Conservation officer, historic buildings and a Surveyor, mining with first shipment. The patient will receive a drug information handout for each medication shipped and additional FDA Medication Guides as required.       DISEASE/MEDICATION-SPECIFIC INFORMATION        For patients on injectable medications: Patient currently has 1 doses left.  Next injection is scheduled for 09/14/21.    SPECIALTY MEDICATION ADHERENCE     Medication Adherence    Patient reported X missed doses in the last month: 0  Specialty Medication: Repatha 140mg /ml  Patient is on additional specialty medications: No  Patient is on more than two specialty medications: No              Were doses missed due to medication being on hold? No    Repatha 140 mg/ml: 0 days of medicine on hand       REFERRAL TO PHARMACIST     Referral to the pharmacist: Not needed      [facility]     Shipping address confirmed in Epic.     Delivery Scheduled: Yes, Expected medication delivery date: 09/22/21.     Medication will be delivered via Same Day Courier to the prescription address in Epic WAM.    Nancy Nordmann [facility] Specialty Technician

## 2021-09-12 ENCOUNTER — Ambulatory Visit: Admit: 2021-09-12 | Discharge: 2021-09-12 | Payer: MEDICARE

## 2021-09-12 MED ADMIN — sodium chloride (NS) 0.9 % infusion: 10 mL/h | INTRAVENOUS | @ 18:00:00 | Stop: 2021-09-12

## 2021-09-12 MED ADMIN — propofoL (DIPRIVAN) injection: INTRAVENOUS | @ 19:00:00 | Stop: 2021-09-12

## 2021-09-12 MED ADMIN — propofol (DIPRIVAN) infusion 10 mg/mL: INTRAVENOUS | @ 19:00:00 | Stop: 2021-09-12

## 2021-09-20 ENCOUNTER — Ambulatory Visit
Admit: 2021-09-20 | Discharge: 2021-09-21 | Payer: MEDICARE | Attending: Student in an Organized Health Care Education/Training Program | Primary: Student in an Organized Health Care Education/Training Program

## 2021-09-20 DIAGNOSIS — Z79899 Other long term (current) drug therapy: Principal | ICD-10-CM

## 2021-09-20 DIAGNOSIS — L409 Psoriasis, unspecified: Principal | ICD-10-CM

## 2021-09-20 MED ORDER — ADALIMUMAB 40 MG/0.8 ML SUBCUTANEOUS PEN KIT
SUBCUTANEOUS | 3 refills | 84.00000 days | Status: CP
Start: 2021-09-20 — End: ?

## 2021-09-20 MED ORDER — TRIAMCINOLONE ACETONIDE 0.1 % TOPICAL OINTMENT
Freq: Two times a day (BID) | TOPICAL | 2 refills | 0.00000 days | Status: CP
Start: 2021-09-20 — End: 2022-09-20

## 2021-09-20 NOTE — Unmapped (Signed)
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 Previously well controlled on Humira, currently flaring off biologics   - restart Humira 80 mg [place] x 1 then 40 mg [place] every other week thereafter  - for now she was prescribed triamcinolone ointment to apply BID  - insurance does not cover clobetasol, halobetasol, or augmented betamethasone  - adalimumab (HUMIRA) 40 mg/0.8 mL subcutaneous pen kit; Inject the contents of 2 pens (80mg ) under the skin once for initial dose, then inject 1 pen (40mg ) every 2 weeks  - Discussed takes 6-12 months to determine response; SER increased risk of infections (fungal, TB, URI, pneumonia), HBV reactivation, injection site reaction, cytopenia and lymphoma/malignancy, rash (paradoxical psoriasis, lupus like reaction); contraindications NOT present; reviewed lab monitoring: baseline CBCd, CMP, Hep panel,  quant gold; then CBC, CMP at 6months and annual PPD/quant gold; avoid live vaccines.    High risk medication use (restart Humira)  - cbc w diff and cmp from 10/2020 reviewed  - hepatitis panel wnl   - needs updated quant gold  - Quantiferon TB Gold Plus; Future      The patient was advised to call for an appointment should any new, changing, or symptomatic lesions develop.     RTC: Return in about 6 months (around 03/20/2022). or sooner as needed   _________________________________________________________________      Chief Complaint     Chief Complaint   Patient presents with   ??? Follow-up     On psoriasis pt currently having a flare on back        HPI     Megan Robertson is a 50 y.o. female who presents as a returning patient (last seen 07/09/2019) to [facility] for follow up of psoriasis. LV: started Humira. She has been lost to follow up since    Today  - she notes she was on humira from LV to August2021, during which time she was hospitalized for AV fistula graft revision.   - she notes her skin was clear on Humira and she denies issues with injections  - her psoriasis has been flaring on scalp, ears, back, elbows for the past several months  - she denies joint pains  - she has used triamcinolone and clobetasol in the past but they are not helpful    The patient denies any other new or changing lesions or areas of concern.     Pertinent Past Medical History   Psoriasis history:  Long history of psoriasis (first manifested around age 20). ??Has been treated with topical steroids, light, MTX (many years ago; injections, pills d/c because of GI upset) and Enbrel (d/c 2014 because of improving disease and insurance changes?).     ESRD  HLD  HTN  Asthma  FSGS    Problem List    None          Past Medical History, Family History, Social History, Medication List, Allergies, and Problem List were reviewed in the rooming section of Epic.     ROS: Other than symptoms mentioned in the HPI, no fevers, chills, or other skin complaints    Physical Examination     GENERAL: Well-appearing female in no acute distress, resting comfortably.  NEURO: Alert and oriented, answers questions appropriately  PSYCH: Normal mood and affect  SKIN: Examination of the hair, scalp, ears, face, neck, chest, back and bilateral upper extremities and hands was performed  -  scaly plaques on scalp, lumbosacral back, extensor elbows    All areas not commented on are within normal limits or unremarkable  - nail exam limited by artificial nails      (Approved Template 07/04/2020)

## 2021-09-22 DIAGNOSIS — L409 Psoriasis, unspecified: Principal | ICD-10-CM

## 2021-09-22 MED ORDER — ADALIMUMAB 40 MG/0.8 ML SUBCUTANEOUS PEN KIT
SUBCUTANEOUS | 3 refills | 84.00000 days | Status: CP
Start: 2021-09-22 — End: ?

## 2021-09-22 MED ORDER — HUMIRA PEN 40 MG/0.8 ML SUBCUTANEOUS KIT
SUBCUTANEOUS | 0 refills | 0.00000 days | Status: CP
Start: 2021-09-22 — End: ?
  Filled 2021-10-12: qty 3, 30d supply, fill #0

## 2021-09-22 MED FILL — REPATHA SURECLICK 140 MG/ML SUBCUTANEOUS PEN INJECTOR: SUBCUTANEOUS | 28 days supply | Qty: 2 | Fill #5

## 2021-09-25 ENCOUNTER — Other Ambulatory Visit: Admit: 2021-09-25 | Discharge: 2021-09-26 | Payer: MEDICARE

## 2021-09-25 LAB — IRON PANEL
IRON SATURATION (CALC): 31 %
IRON: 49 ug/dL — ABNORMAL LOW
TOTAL IRON BINDING CAPACITY (CALC): 157.5 ug/dL — ABNORMAL LOW (ref 250.0–425.0)
TRANSFERRIN: 125 mg/dL — ABNORMAL LOW

## 2021-09-25 LAB — FERRITIN: FERRITIN: 255.6 ng/mL

## 2021-09-26 DIAGNOSIS — L409 Psoriasis, unspecified: Principal | ICD-10-CM

## 2021-09-27 LAB — TB AG1: TB AG1 VALUE: 0.07

## 2021-09-27 LAB — TB NIL: TB NIL VALUE: 0.09

## 2021-09-27 LAB — QUANTIFERON TB GOLD PLUS
QUANTIFERON ANTIGEN 1 MINUS NIL: -0.02 [IU]/mL
QUANTIFERON ANTIGEN 2 MINUS NIL: -0.03 [IU]/mL
QUANTIFERON MITOGEN: 9.91 [IU]/mL
QUANTIFERON TB GOLD PLUS: NEGATIVE
QUANTIFERON TB NIL VALUE: 0.09 [IU]/mL

## 2021-09-27 LAB — TB AG2: TB AG2 VALUE: 0.06

## 2021-09-27 LAB — TB MITOGEN: TB MITOGEN VALUE: 10

## 2021-09-28 NOTE — Unmapped (Signed)
I saw and evaluated the patient, participating in the key portions of the service.  I reviewed the resident’s note.  I agree with the resident’s findings and plan.     Dorene Bruni, MD

## 2021-09-28 NOTE — Unmapped (Signed)
Pt's labs reviewed. Quant gold negative. Pt notified via mychart    Plan resume Humira

## 2021-10-03 NOTE — Unmapped (Signed)
[facility] Specialty Medication Onboarding    Specialty Medication: Humira 80mg //40mg  Starter kit  Prior Authorization: Approved   Financial Assistance: No - copay  <$25  Final Copay/Day Supply: $4 / 28 days    Insurance Restrictions: Yes - max 1 month supply     Notes to Pharmacist: Pt also on Repatha.    The triage team has completed the benefits investigation and has determined that the patient is able to fill this medication at [facility]. Please contact the patient to complete the onboarding or follow up with the prescribing physician as needed.    [facility] Specialty Medication Onboarding    Specialty Medication: Humira cf pen 40mg /0.32ml maintenance  Prior Authorization: Approved   Financial Assistance: No - copay  <$25  Final Copay/Day Supply: $4 / 28 days    Insurance Restrictions: Yes - max 1 month supply     Notes to Pharmacist: Pt also on Repatha.    The triage team has completed the benefits investigation and has determined that the patient is able to fill this medication at [facility]. Please contact the patient to complete the onboarding or follow up with the prescribing physician as needed.

## 2021-10-04 NOTE — Unmapped (Signed)
[facility]   Patient Onboarding/Medication Counseling    Megan Robertson is a 50 y.o. female with psoriasis who I am counseling today on re-initiation of therapy.  I am speaking to the patient.    Was a Nurse, learning disability used for this call? No    Verified patient's date of birth / HIPAA.    Specialty medication(s) to be sent: Inflammatory Disorders: Humira      Non-specialty medications/supplies to be sent: none at this time      Medications not needed at this time: n/a         Humira (adalimumab)    The patient declined counseling on medication administration, missed dose instructions, goals of therapy, side effects and monitoring parameters, warnings and precautions, drug/food interactions and storage, handling precautions, and disposal because they have taken the medication previously. The information in the declined sections below are for informational purposes only and was not discussed with patient.       Medication & Administration     Dosage: Plaque psoriasis: Inject 80mg  under the skin on day 1, then 40mg  every 14 days    Lab tests required prior to treatment initiation:  ??? Tuberculosis: Tuberculosis screening resulted in a non-reactive Quantiferon TB Gold assay.  ??? Hepatitis B: Hepatitis B serology studies are complete and non-reactive.    Administration:     Prefilled auto-injector pen  1. Gather all supplies needed for injection on a clean, flat working surface: medication pen removed from packaging, alcohol swab, sharps container, etc.  2. Look at the medication label - look for correct medication, correct dose, and check the expiration date  3. Look at the medication - the liquid visible in the window on the side of the pen device should appear clear and colorless  4. Lay the auto-injector pen on a flat surface and allow it to warm up to room temperature for at least 30-45 minutes  5. Select injection site - you can use the front of your thigh or your belly (but not the area 2 inches around your belly button); if someone else is giving you the injection you can also use your upper arm in the skin covering your triceps muscle  6. Prepare injection site - wash your hands and clean the skin at the injection site with an alcohol swab and let it air dry, do not touch the injection site again before the injection  7. Pull the 2 safety caps straight off - gray/white to uncover the needle cover and the plum cap to uncover the plum activator button, do not remove until immediately prior to injection and do not touch the white needle cover  8. Gently squeeze the area of cleaned skin and hold it firmly to create a firm surface at the selected injection site  9. Put the white needle cover against your skin at the injection site at a 90 degree angle, hold the pen such that you can see the clear medication window  10. Press down and hold the pen firmly against your skin, press the plum activator button to initiate the injection, there will be a click when the injection starts  11. Continue to hold the pen firmly against your skin for about 10-15 seconds - the window will start to turn solid yellow  12. To verify the injection is complete after 10-15 seconds, look and ensure the window is solid yellow and then pull the pen away from your skin  13. Dispose of the used auto-injector pen  immediately in your sharps disposal container the needle will be covered automatically  14. If you see any blood at the injection site, press a cotton ball or gauze on the site and maintain pressure until the bleeding stops, do not rub the injection site    Adherence/Missed dose instructions:  If your injection is given more than 3 days after your scheduled injection date - consult your pharmacist for additional instructions on how to adjust your dosing schedule.    Goals of Therapy     - Achieve remission/inactive disease or low/minimal disease activity  - Maintenance of function  - Minimization of systemic manifestations and comorbidities  - Maintenance of effective psychosocial functioning    Side Effects & Monitoring Parameters     ??? Injection site reaction (redness, irritation, inflammation localized to the site of administration)  ??? Signs of a common cold - minor sore throat, runny or stuffy nose, etc.  ??? Upset stomach  ??? Headache    The following side effects should be reported to the provider:  ??? Signs of a hypersensitivity reaction - rash; hives; itching; red, swollen, blistered, or peeling skin; wheezing; tightness in the chest or throat; difficulty breathing, swallowing, or talking; swelling of the mouth, face, lips, tongue, or throat; etc.  ??? Reduced immune function - report signs of infection such as fever; chills; body aches; very bad sore throat; ear or sinus pain; cough; more sputum or change in color of sputum; pain with passing urine; wound that will not heal, etc.  Also at a slightly higher risk of some malignancies (mainly skin and blood cancers) due to this reduced immune function.  o In the case of signs of infection - the patient should hold the next dose of Humira?? and call your primary care provider to ensure adequate medical care.  Treatment may be resumed when infection is treated and patient is asymptomatic.  ??? Changes in skin - a new growth or lump that forms; changes in shape, size, or color of a previous mole or marking  ??? Signs of unexplained bruising or bleeding - throwing up blood or emesis that looks like coffee grounds; black, tarry, or bloody stool; etc.  ??? Signs of new or worsening heart failure - shortness of breath; sudden weight gain; heartbeat that is not normal; swelling in the arms or legs that is new or worse      Contraindications, Warnings, & Precautions     ??? Have your bloodwork checked as you have been told by your prescriber  ??? Talk with your doctor if you are pregnant, planning to become pregnant, or breastfeeding  ??? Discuss the possible need for holding your dose(s) of Humira?? when a planned procedure is scheduled with the prescriber as it may delay healing/recovery timeline       Drug/Food Interactions     ??? Medication list reviewed in Epic. The patient was instructed to inform the care team before taking any new medications or supplements. No drug interactions identified.   ??? Talk with you prescriber or pharmacist before receiving any live vaccinations while taking this medication and after you stop taking it    Storage, Handling Precautions, & Disposal     ??? Store this medication in the refrigerator.  Do not freeze  ??? If needed, you may store at room temperature for up to 14 days  ??? Store in original packaging, protected from light  ??? Do not shake  ??? Dispose of used syringes/pens in a sharps disposal  container            Current Medications (including OTC/herbals), Comorbidities and Allergies     Current Outpatient Medications   Medication Sig Dispense Refill   ??? ADALIMUMAB PEN CITRATE FREE 40 MG/0.4 ML Inject the contents of 1 pen (40 mg total) under the skin every fourteen (14) days. 6 each 3   ??? allopurinoL (ZYLOPRIM) 100 MG tablet Take 1 tablet (100 mg total) by mouth Every Tuesday, Thursday and Saturday. 36 tablet 3   ??? amLODIPine (NORVASC) 10 MG tablet Take 1 tablet (10 mg total) by mouth daily. 90 tablet 0   ??? apixaban (ELIQUIS) 5 mg Tab Take 1 tablet (5 mg total) by mouth in the morning. 30 tablet 0   ??? aspirin 81 MG chewable tablet Chew 1 tablet (81 mg total) daily. 36 tablet 0   ??? atorvastatin (LIPITOR) 80 MG tablet Take 1 tablet (80 mg total) by mouth daily. 30 tablet 0   ??? beclomethasone dipropionate (QVAR REDIHALER) 80 mcg/actuation inhaler 1 puff every morning, 2 puffs every evening (Patient taking differently: continuous as needed. 1 puff every morning, 2 puffs every evening as needed) 10.6 g 0   ??? evolocumab (REPATHA SURECLICK) 140 mg/mL PnIj Inject the contents of 1 pen (140 mg) under the skin every fourteen (14) days. 6 mL 3   ??? furosemide (LASIX) 40 MG tablet Take 1 tablet (40 mg total) by mouth 3 (three) times a week. To be taken once a day if you develop leg swelling. 36 tablet 3   ??? adalimumab 80 mg/0.8 mL-40 mg/0.4 mL PnKt Inject the contents of 1 pen (80mg ) under the skin for one dose, THEN 1 pen (40mg ) every 14 days thereafter. 3 each 0   ??? isosorbide mononitrate (IMDUR) 120 MG 24 hr tablet Take 1 tablet (120 mg total) by mouth daily. 30 tablet 0   ??? sevelamer (RENVELA) 800 mg tablet TAKE 1 TABLET BY MOUTH THREE TIMES A DAY WITH MEALS     ??? sevelamer (RENVELA) 800 mg tablet Take 1 tablet by mouth.     ??? triamcinolone (KENALOG) 0.1 % ointment Apply topically Two (2) times a day. To psoriasis spots. Stop when skin is clear 454 g 2     No current facility-administered medications for this visit.       Allergies   Allergen Reactions   ??? Cephalexin Itching     unknown; tolerates cefepime   ??? Mango Itching   ??? Peach (Prunus Persica) Itching   ??? Wellbutrin [Bupropion Hcl] Other (See Comments)     Seizure   ??? Egg Itching   ??? Fish Containing Products Itching   ??? Lactase-Rennet Nausea And Vomiting   ??? Lexiscan [Regadenoson] Nausea And Vomiting     Patient had nausea after the Lexi then vomitted [id number]   ??? Mushroom Itching   ??? Tomato (Solanum Lycopersicum) Itching       Patient Active Problem List   Diagnosis   ??? Stage 4 chronic kidney disease (CMS-HCC)   ??? Gout   ??? GERD (gastroesophageal reflux disease)   ??? Chronic diarrhea   ??? Hypertension   ??? Ductal hyperplasia of breast   ??? Affective disorder, psychotic (CMS-HCC)   ??? Bereavement   ??? PTSD (post-traumatic stress disorder)   ??? Nipple discharge   ??? Prediabetes   ??? History of head injury   ??? Mild persistent asthma without complication   ??? Other chest pain   ??? Dyspnea on exertion   ???  Dyslipidemia   ??? Stable angina (CMS-HCC)   ??? OSA (obstructive sleep apnea)   ??? Lichen sclerosus   ??? Vasomotor symptoms due to menopause   ??? Dyspareunia in female   ??? 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)   ??? Leukocytosis   ??? Anemia   ??? Thrombocytopenia (CMS-HCC) ??? Hypokalemia   ??? Tachycardia   ??? Anemia in chronic kidney disease   ??? Anaphylactic shock, unspecified, initial encounter   ??? Coagulation defect, unspecified (CMS-HCC)   ??? Colitis   ??? End stage renal disease (CMS-HCC)   ??? Metabolic acidosis, NAG, bicarbonate losses   ??? Obesity   ??? S/P vaginal hysterectomy   ??? Secondary hyperparathyroidism of renal origin (CMS-HCC)   ??? Stenosis of left brachial artery (CMS-HCC)   ??? Need for shingles vaccine       Reviewed and up to date in Epic.    Appropriateness of Therapy     Acute infections noted within Epic:  No active infections  Patient reported infection: None    Is medication and dose appropriate based on diagnosis and infection status? Yes    Prescription has been clinically reviewed: Yes      Baseline Quality of Life Assessment      How many days over the past month did your psoriasis  keep you from your normal activities? For example, brushing your teeth or getting up in the morning. Patient declined to answer    Financial Information     Medication Assistance provided: Prior Authorization    Anticipated copay of $4 (28 days) reviewed with patient. Verified delivery address.    Delivery Information     Scheduled delivery date: 10/12/21    Expected start date: 10/12/21    Medication will be delivered via Same Day Courier to the prescription address in [facility].  This shipment will not require a signature.      Explained the services we provide at [facility] and that each month we would call to set up refills.  Stressed importance of returning phone calls so that we could ensure they receive their medications in time each month.  Informed patient that we should be setting up refills 7-10 days prior to when they will run out of medication.  A pharmacist will reach out to perform a clinical assessment periodically.  Informed patient that a welcome packet, containing information about our pharmacy and other support services, a Notice of Privacy Practices, and a drug information handout will be sent.      The patient or caregiver noted above participated in the development of this care plan and knows that they can request review of or adjustments to the care plan at any time.      Patient or caregiver verbalized understanding of the above information as well as how to contact the pharmacy at [phone] option 4 with any questions/concerns.  The pharmacy is open Monday through Friday 8:30am-4:30pm.  A pharmacist is available 24/7 via pager to answer any clinical questions they may have.    Patient Specific Needs     - Does the patient have any physical, cognitive, or cultural barriers? No    - Does the patient have adequate living arrangements? (i.e. the ability to store and take their medication appropriately) Yes    - Did you identify any home environmental safety or security hazards? No    - Patient prefers to have medications discussed with  Patient     - Is the patient or  caregiver able to read and understand education materials at a high school level or above? Yes    - Patient's primary language is  English     - Is the patient high risk? No    SOCIAL DETERMINANTS OF HEALTH     At the [facility], we have learned that life circumstances - like trouble affording food, housing, utilities, or transportation can affect the health of many of our patients.   That is why we wanted to ask: are you currently experiencing any life circumstances that are negatively impacting your health and/or quality of life? Patient declined to answer    Social Determinants of Health     Food Insecurity: Not on file   Tobacco Use: Medium Risk   ??? Smoking Tobacco Use: Former   ??? Smokeless Tobacco Use: Never   ??? Passive Exposure: Not on file   Transportation Needs: Not on file   Alcohol Use: Not on file   Housing/Utilities: Unknown   ??? Within the past 12 months, have you ever stayed: outside, in a car, in a tent, in an overnight shelter, or temporarily in someone else's home (i.e. couch-surfing)?: No   ??? Are you worried about losing your housing?: Not on file   ??? Within the past 12 months, have you been unable to get utilities (heat, electricity) when it was really needed?: Not on file   Substance Use: Not on file   Financial Resource Strain: Not on file   Physical Activity: Not on file   Health Literacy: Low Risk    ??? : Never   Stress: Not on file   Intimate Partner Violence: Not on file   Depression: At risk   ??? PHQ-2 Score: 6   Social Connections: Not on file       Would you be willing to receive help with any of the needs that you have identified today? Not applicable       Camillo Flaming  [facility] Specialty Pharmacist

## 2021-10-09 NOTE — Unmapped (Signed)
[facility] Refill Coordination Note    Specialty Medication(s) to be Shipped:   General Specialty: Repatha    Other medication(s) to be shipped: No additional medications requested for fill at this time     Megan Robertson, DOB: 04/12/1971  Phone: [phone] (home)       All above HIPAA information was verified with patient.     Was a Nurse, learning disability used for this call? No    Completed refill call assessment today to schedule patient's medication shipment from the [facility] [phone]).  All relevant notes have been reviewed.     Specialty medication(s) and dose(s) confirmed: Regimen is correct and unchanged.   Changes to medications: Megan Robertson reports no changes at this time.  Changes to insurance: No  New side effects reported not previously addressed with a pharmacist or physician: None reported  Questions for the pharmacist: No    Confirmed patient received a Conservation officer, historic buildings and a Surveyor, mining with first shipment. The patient will receive a drug information handout for each medication shipped and additional FDA Medication Guides as required.       DISEASE/MEDICATION-SPECIFIC INFORMATION        For patients on injectable medications: Patient currently has 1 doses left.  Next injection is scheduled for 10/12/21.    SPECIALTY MEDICATION ADHERENCE          Were doses missed due to medication being on hold? No    Repatha 140 mg/ml: 17 days of medicine on hand       REFERRAL TO PHARMACIST     Referral to the pharmacist: Not needed      Megan Robertson     Shipping address confirmed in Epic.     Delivery Scheduled: Yes, Expected medication delivery date: 10/12/21.     Medication will be delivered via Same Day Courier to the prescription address in Epic W[facility].    Megan Robertson   [facility] Specialty Pharmacist

## 2021-10-12 MED FILL — REPATHA SURECLICK 140 MG/ML SUBCUTANEOUS PEN INJECTOR: SUBCUTANEOUS | 28 days supply | Qty: 2 | Fill #6

## 2021-11-01 ENCOUNTER — Ambulatory Visit: Admit: 2021-11-01 | Discharge: 2021-11-03 | Payer: MEDICARE

## 2021-11-08 MED ORDER — CINACALCET 30 MG TABLET
ORAL_TABLET | Freq: Every day | ORAL | 0 refills | 90 days | Status: CP
Start: 2021-11-08 — End: 2022-02-06

## 2021-11-13 NOTE — Unmapped (Signed)
Per Dr. Dan Humphreys Could you let this patient know that her sleep study showed she had positional sleep apnea. ??Her overall AHI was normal, but her apnea was severe in supine sleep. ??She should avoid sleeping on her back. ??She can make a hme made device by sewing tennis balls into the back of a t-shirt, but there are commercial devices such as Rematee and Zzoma. ??If this does not help then we may need to repeat a sleep study where she spends more time on her back.    Left message for return call

## 2021-11-14 MED ORDER — AMLODIPINE 10 MG TABLET
ORAL_TABLET | Freq: Every day | ORAL | 0 refills | 90 days | Status: CP
Start: 2021-11-14 — End: 2022-02-12
  Filled 2021-11-22: qty 90, 90d supply, fill #0

## 2021-11-14 NOTE — Unmapped (Signed)
[facility] Clinical Assessment & Refill Coordination Note    Megan Robertson, DOB: 1971-06-01  Phone: [phone] (home)     All above HIPAA information was verified with patient.     Was a Nurse, learning disability used for this call? No    Specialty Medication(s):   Inflammatory Disorders: Humira and General Specialty: Repatha     Current Outpatient Medications   Medication Sig Dispense Refill   ??? ADALIMUMAB PEN CITRATE FREE 40 MG/0.4 ML Inject the contents of 1 pen (40 mg total) under the skin every fourteen (14) days. 6 each 3   ??? allopurinoL (ZYLOPRIM) 100 MG tablet Take 1 tablet (100 mg total) by mouth Every Tuesday, Thursday and Saturday. 36 tablet 3   ??? amLODIPine (NORVASC) 10 MG tablet Take 1 tablet (10 mg total) by mouth daily. 90 tablet 0   ??? apixaban (ELIQUIS) 5 mg Tab Take 1 tablet (5 mg total) by mouth in the morning. 30 tablet 0   ??? aspirin 81 MG chewable tablet Chew 1 tablet (81 mg total) daily. 36 tablet 0   ??? atorvastatin (LIPITOR) 80 MG tablet Take 1 tablet (80 mg total) by mouth daily. 30 tablet 0   ??? beclomethasone dipropionate (QVAR REDIHALER) 80 mcg/actuation inhaler 1 puff every morning, 2 puffs every evening (Patient taking differently: continuous as needed. 1 puff every morning, 2 puffs every evening as needed) 10.6 g 0   ??? cinacalcet (SENSIPAR) 30 MG tablet Take 1 tablet (30 mg total) by mouth daily. 90 tablet 0   ??? evolocumab (REPATHA SURECLICK) 140 mg/mL PnIj Inject the contents of 1 pen (140 mg) under the skin every fourteen (14) days. 6 mL 3   ??? furosemide (LASIX) 40 MG tablet Take 1 tablet (40 mg total) by mouth 3 (three) times a week. To be taken once a day if you develop leg swelling. 36 tablet 3   ??? adalimumab 80 mg/0.8 mL-40 mg/0.4 mL PnKt Inject the contents of 1 pen (80mg ) under the skin for one dose, THEN 1 pen (40mg ) every 14 days thereafter. 3 each 0   ??? isosorbide mononitrate (IMDUR) 120 MG 24 hr tablet Take 1 tablet (120 mg total) by mouth daily. 30 tablet 0   ??? sevelamer (RENVELA) 800 mg tablet TAKE 1 TABLET BY MOUTH THREE TIMES A DAY WITH MEALS     ??? sevelamer (RENVELA) 800 mg tablet Take 1 tablet by mouth.     ??? triamcinolone (KENALOG) 0.1 % ointment Apply topically Two (2) times a day. To psoriasis spots. Stop when skin is clear 454 g 2     No current facility-administered medications for this visit.        Changes to medications: Deema reports no changes at this time.    Allergies   Allergen Reactions   ??? Cephalexin Itching     unknown; tolerates cefepime   ??? Mango Itching   ??? Peach (Prunus Persica) Itching   ??? Wellbutrin [Bupropion Hcl] Other (See Comments)     Seizure   ??? Egg Itching   ??? Fish Containing Products Itching   ??? Lactase-Rennet Nausea And Vomiting   ??? Lexiscan [Regadenoson] Nausea And Vomiting     Patient had nausea after the Lexi then vomitted [id number]   ??? Mushroom Itching   ??? Tomato (Solanum Lycopersicum) Itching       Changes to allergies: No    SPECIALTY MEDICATION ADHERENCE     Humira 40mg /0.36mL : 14 days of medicine on  hand   Repatha 140 mg/ml: 14 days of medicine on hand       Medication Adherence    Patient reported X missed doses in the last month: 0  Specialty Medication: Humira 40mg /0.77mL  Patient is on additional specialty medications: Yes  Additional Specialty Medications: Repatha 140 mg/mL  Patient Reported Additional Medication X Missed Doses in the Last Month: 0  Informant: patient          Specialty medication(s) dose(s) confirmed: Regimen is correct and unchanged.     Are there any concerns with adherence? No    Adherence counseling provided? Not needed    CLINICAL MANAGEMENT AND INTERVENTION      Clinical Benefit Assessment:    Do you feel the medicine is effective or helping your condition? Yes    Clinical Benefit counseling provided? Not needed    Adverse Effects Assessment:    Are you experiencing any side effects? No    Are you experiencing difficulty administering your medicine? No    Quality of Life Assessment:     How many days over the past month did your dyslipidemia and psoriasis  keep you from your normal activities? For example, brushing your teeth or getting up in the morning. Patient declined to answer    Have you discussed this with your provider? Not needed    Acute Infection Status:    Acute infections noted within Epic:  No active infections  Patient reported infection: None    Therapy Appropriateness:    Is therapy appropriate and patient progressing towards therapeutic goals? Yes, therapy is appropriate and should be continued    DISEASE/MEDICATION-SPECIFIC INFORMATION      For patients on injectable medications:   Patient currently has 0 Humira doses left.  Next injection is scheduled for 11/28/21.  Patient currently has 0 Humira doses left.  Next injection is scheduled for 11/28/21.    PATIENT SPECIFIC NEEDS     - Does the patient have any physical, cognitive, or cultural barriers? No    - Is the patient high risk? No    - Does the patient require a Care Management Plan? No     SOCIAL DETERMINANTS OF HEALTH     At the [facility], we have learned that life circumstances - like trouble affording food, housing, utilities, or transportation can affect the health of many of our patients.   That is why we wanted to ask: are you currently experiencing any life circumstances that are negatively impacting your health and/or quality of life? Patient declined to answer    Social Determinants of Health     Food Insecurity: Not on file   Tobacco Use: Medium Risk   ??? Smoking Tobacco Use: Former   ??? Smokeless Tobacco Use: Never   ??? Passive Exposure: Not on file   Transportation Needs: Not on file   Alcohol Use: Not on file   Housing/Utilities: Unknown   ??? Within the past 12 months, have you ever stayed: outside, in a car, in a tent, in an overnight shelter, or temporarily in someone else's home (i.e. couch-surfing)?: No   ??? Are you worried about losing your housing?: Not on file   ??? Within the past 12 months, have you been unable to get utilities (heat, electricity) when it was really needed?: Not on file   Substance Use: Not on file   Financial Resource Strain: Not on file   Physical Activity: Not on file   Health Literacy: Low Risk    ??? :  Never   Stress: Not on file   Intimate Partner Violence: Not on file   Depression: At risk   ??? PHQ-2 Score: 6   Social Connections: Not on file       Would you be willing to receive help with any of the needs that you have identified today? Not applicable       SHIPPING     Specialty Medication(s) to be Shipped:   Inflammatory Disorders: Humira and General Specialty: Repatha    Other medication(s) to be shipped: amlodipine     Changes to insurance: No    Delivery Scheduled: Yes, Expected medication delivery date: 11/22/21.  However, Rx request for amlodpine refills was sent to the provider as there are none remaining.     Medication will be delivered via Same Day Courier to the confirmed prescription address in [facility].    The patient will receive a drug information handout for each medication shipped and additional FDA Medication Guides as required.  Verified that patient has previously received a Conservation officer, historic buildings and a Surveyor, mining.    The patient or caregiver noted above participated in the development of this care plan and knows that they can request review of or adjustments to the care plan at any time.      All of the patient's questions and concerns have been addressed.    Camillo Flaming   [facility] Specialty Pharmacist

## 2021-11-15 DIAGNOSIS — E785 Hyperlipidemia, unspecified: Principal | ICD-10-CM

## 2021-11-22 MED FILL — HUMIRA PEN CITRATE FREE 40 MG/0.4 ML: SUBCUTANEOUS | 28 days supply | Qty: 2 | Fill #0

## 2021-11-22 MED FILL — REPATHA SURECLICK 140 MG/ML SUBCUTANEOUS PEN INJECTOR: SUBCUTANEOUS | 28 days supply | Qty: 2 | Fill #7

## 2021-12-14 NOTE — Unmapped (Signed)
[facility] Refill Coordination Note    Specialty Medication(s) to be Shipped:   Inflammatory Disorders: Humira and General Specialty: Repatha    Other medication(s) to be shipped: No additional medications requested for fill at this time     Megan Robertson, DOB: 11/27/1970  Phone: [phone] (home)       All above HIPAA information was verified with patient.     Was a Nurse, learning disability used for this call? No    Completed refill call assessment today to schedule patient's medication shipment from the [facility] [phone]).  All relevant notes have been reviewed.     Specialty medication(s) and dose(s) confirmed: Regimen is correct and unchanged.   Changes to medications: Megan Robertson reports no changes at this time.  Changes to insurance: No  New side effects reported not previously addressed with a pharmacist or physician: None reported  Questions for the pharmacist: No    Confirmed patient received a Conservation officer, historic buildings and a Surveyor, mining with first shipment. The patient will receive a drug information handout for each medication shipped and additional FDA Medication Guides as required.       DISEASE/MEDICATION-SPECIFIC INFORMATION        For patients on injectable medications: Patient currently has 0 doses left.  Next injection is scheduled for 12/21/2021.    SPECIALTY MEDICATION ADHERENCE     Medication Adherence    Patient reported X missed doses in the last month: 0  Specialty Medication: Repatha 140mg /ml  Patient is on additional specialty medications: Yes  Additional Specialty Medications: Humira  Patient Reported Additional Medication X Missed Doses in the Last Month: 0  Patient is on more than two specialty medications: No        Were doses missed due to medication being on hold? No    REFERRAL TO PHARMACIST     Referral to the pharmacist: Not needed      [facility]     Shipping address confirmed in Epic.     Delivery Scheduled: Yes, Expected medication delivery date: 12/19/2021.     Medication will be delivered via Same Day Courier to the prescription address in Epic W[facility].    Oretha Milch   [facility] Specialty Technician

## 2021-12-19 MED FILL — HUMIRA PEN CITRATE FREE 40 MG/0.4 ML: SUBCUTANEOUS | 28 days supply | Qty: 2 | Fill #1

## 2021-12-19 MED FILL — REPATHA SURECLICK 140 MG/ML SUBCUTANEOUS PEN INJECTOR: SUBCUTANEOUS | 28 days supply | Qty: 2 | Fill #8

## 2022-01-15 DIAGNOSIS — E785 Hyperlipidemia, unspecified: Principal | ICD-10-CM

## 2022-01-15 MED ORDER — REPATHA SURECLICK 140 MG/ML SUBCUTANEOUS PEN INJECTOR
SUBCUTANEOUS | 3 refills | 84 days | Status: CP
Start: 2022-01-15 — End: 2022-04-15
  Filled 2022-02-27: qty 6, 84d supply, fill #0

## 2022-01-15 NOTE — Unmapped (Signed)
The [facility] has made a second and final attempt to reach this patient to refill the following medication:Repatha.      We have left voicemails on the following phone numbers: [phone], [facility].    Dates contacted: 01/09/22, 01/15/22  Last scheduled delivery: 12/19/21    The patient may be at risk of non-compliance with this medication. The patient should call the [facility] at [phone]  Option 4, then Option 2 (all other specialty patients) to refill medication.    Megan Robertson   [facility]

## 2022-02-20 NOTE — Unmapped (Signed)
1st attempt. LVM for pt to call and schedule phone visit.

## 2022-02-22 NOTE — Unmapped (Signed)
r/s 07/18 appt

## 2022-02-22 NOTE — Unmapped (Signed)
[facility] Refill Coordination Note    Specialty Medication(s) to be Shipped:   Inflammatory Disorders: Humira and General Specialty: Repatha    Other medication(s) to be shipped: No additional medications requested for fill at this time     Megan Robertson, DOB: Feb 28, 1971  Phone: [phone] (home)       All above HIPAA information was verified with patient.     Was a Nurse, learning disability used for this call? No    Completed refill call assessment today to schedule patient's medication shipment from the [facility] [phone]).  All relevant notes have been reviewed.     Specialty medication(s) and dose(s) confirmed: Regimen is correct and unchanged.   Changes to medications: Megan Robertson reports no changes at this time.  Changes to insurance: No  New side effects reported not previously addressed with a pharmacist or physician: None reported  Questions for the pharmacist: No    Confirmed patient received a Conservation officer, historic buildings and a Surveyor, mining with first shipment. The patient will receive a drug information handout for each medication shipped and additional FDA Medication Guides as required.       DISEASE/MEDICATION-SPECIFIC INFORMATION        For patients on injectable medications: Patient currently has 0 doses left.  Next injection is scheduled for patient could not confirm exact date for next injection.    SPECIALTY MEDICATION ADHERENCE     Medication Adherence    Patient reported X missed doses in the last month: 0  Specialty Medication: Humira 40mg /0.10ml  Patient is on additional specialty medications: Yes  Additional Specialty Medications: Repatha 140mg /ml  Patient Reported Additional Medication X Missed Doses in the Last Month: 0  Patient is on more than two specialty medications: No  Informant: patient        Were doses missed due to medication being on hold? No    REFERRAL TO PHARMACIST     Referral to the pharmacist: Not needed      [facility]     Shipping address confirmed in Epic.     Delivery Scheduled: Yes, Expected medication delivery date: 02/27/22.     Medication will be delivered via Same Day Courier to the prescription address in Epic WAM.    Megan Robertson   [facility] Specialty Technician

## 2022-02-22 NOTE — Unmapped (Signed)
2nd attempt. Lvm for pt to call and schedule appointment.

## 2022-02-23 NOTE — Unmapped (Signed)
Pt called back and confirmed phone visit on 03/20/22.  Sent letter.

## 2022-02-27 DIAGNOSIS — E785 Hyperlipidemia, unspecified: Principal | ICD-10-CM

## 2022-02-27 DIAGNOSIS — L409 Psoriasis, unspecified: Principal | ICD-10-CM

## 2022-02-27 MED FILL — HUMIRA PEN CITRATE FREE 40 MG/0.4 ML: SUBCUTANEOUS | 28 days supply | Qty: 2 | Fill #2

## 2022-03-20 ENCOUNTER — Institutional Professional Consult (permissible substitution): Admit: 2022-03-20 | Payer: MEDICARE | Attending: Psychologist | Primary: Psychologist

## 2022-03-20 NOTE — Unmapped (Signed)
CONFIDENTIAL PSYCHOLOGICAL NOTE/FOLLOW-UP    Patient Name: Megan Robertson [facility]  Medical Record Number: [id number]  Date of Service: Mar 20, 2022  Clinical Psychologist: Deedra Ehrich, PsyD  Evaluation Duration and Procedures: 10 minute clinical interview; record review; case consultation; 10 minutes spent on pre- and post- appt activities      The patient reports they are currently: at home. I spent 10 minutes on the phone visit with the patient on the date of service. I spent an additional 10 minutes on pre- and post-visit activities on the date of service.     The patient was not located and I was located within 250 yards of a hospital based location during the phone visit. The patient was physically located in [place] or a state in which I am permitted to provide care. The patient and/or parent/guardian understood that s/he may incur co-pays and cost sharing, and agreed to the telemedicine visit. The visit was reasonable and appropriate under the circumstances given the patient's presentation at the time.    The patient and/or parent/guardian has been advised of the potential risks and limitations of this mode of treatment (including, but not limited to, the absence of in-person examination) and has agreed to be treated using telemedicine. The patient's/patient's family's questions regarding telemedicine have been answered.    If the visit was completed in an ambulatory setting, the patient and/or parent/guardian has also been advised to contact their provider???s office for worsening conditions, and seek emergency medical treatment and/or call 911 if the patient deems either necessary.      This evaluation note may contain sensitive and confidential information regarding the patient???s psychosocial adjustment to living with a chronic medical condition. DO NOT share this information outside [facility] without written consent from the patient explicitly stating that mental health records may be released. The limits of confidentiality and the purpose of the evaluation were reviewed. The patient was provided with a verbal description of the nature and purpose of the psychological evaluation. I also reviewed the referral source, specific referral question for this evaluation, foreseeable risks/discomforts, benefits, limits of confidentiality, and mandatory reporting requirements of this provider. The patient was given the opportunity to ask questions and receive answers about the present evaluation. Oral consent was provided by the patient.     BACKGROUND INFORMATION/RECORD REVIEW:   Megan Robertson was initially seen for a psychological evaluation as one part of a comprehensive assessment for kidney transplantation and for treatment planning. She is a 51 y.o. married African-American female from [place], [place]. She has been diagnosed with ESRD on dialysis.       BEHAVIORAL OBSERVATIONS:  Megan Robertson arrived for her appointment  on time.  She was interviewed alone.  Rapport was easily established.  She did not seem motivated to present  herself in an overly favorable light.    MENTAL STATUS EXAMINATION:  Appearance: unable to assess  Motor: unable to assess  Speech/Language: Normal rate, volume, tone, fluency  Mood: good  Affect: Calm and Cooperative  Thought Process: Logical, linear, clear, coherent, goal directed  Thought Content: Denies SI, HI, self harm, delusions, obsessions, paranoid ideation, or ideas of reference  Perceptual Disturbances: Denies auditory and visual hallucinations, behavior not concerning for response to internal stimuli  Orientation: Oriented to person, place, time, and general circumstances  Attention: Able to fully attend without fluctuations in consciousness  Concentration: Able to fully concentrate and attend  Memory: Immediate, short-term, long-term, and recall grossly intact  Fund of Knowledge: Consistent with  level of education and development  Insight: Intact  Judgment: Intact  Impulse Control: Intact    INTERVIEW:  Full assessment not completed today, as pt has completed prior recommendations. She reported she has been doing the same from a mental health standpoint. Writer reiterated prior recommendations and reviewed ways to connect with a local provider. Pt stated she would get an appointment scheduled with a therapist and reach back out to writer.    INTERVENTION:  N/A     PSYCHIATRIC DIAGNOSIS:   PTSD; Prolonged Grief Disorder; History of suicidal ideation and attempt     IMPRESSIONS AND RECOMMENDATIONS:      Megan Robertson was seen today for pre-transplant follow up. She reported no change in mental health symptoms. She reported she has not established care with a therapist to address her mental health symptoms. Writer provided information on how to connect with a provider and pt reported she would Higher education careers adviser when she is able to schedule an appointment.     There are apparent psychological contraindications to transplant.     We have sone concerns about Megan Robertson candidacy on the transplant list. To optimize her Megan Robertson outcomes while listed and after transplant, the following is recommended:     1. Pt should establish care with a therapist to address her current mental health symptoms and increase coping skills PRIOR to consideration for listing.   2. Pt should contact writer and/or [facility] when she has established care with a mental health provider.     Final decision regarding listing status is based upon committee review at selection meeting.     Recommendations discussed with patient?  yes  Agreed upon by patient?  yes     Megan Robertson was given this writer's business card with confidential voice mail number and instructed to call 911 for emergencies.

## 2022-03-21 ENCOUNTER — Ambulatory Visit
Admit: 2022-03-21 | Discharge: 2022-03-22 | Payer: MEDICARE | Attending: Student in an Organized Health Care Education/Training Program | Primary: Student in an Organized Health Care Education/Training Program

## 2022-03-21 DIAGNOSIS — L409 Psoriasis, unspecified: Principal | ICD-10-CM

## 2022-03-21 DIAGNOSIS — Z79899 Other long term (current) drug therapy: Principal | ICD-10-CM

## 2022-03-21 MED ORDER — FLUOCINOLONE 0.01 % TOPICAL BODY OIL
Freq: Every day | TOPICAL | 5 refills | 0 days | Status: CP | PRN
Start: 2022-03-21 — End: ?

## 2022-03-21 NOTE — Unmapped (Addendum)
Dermatology Note     Assessment and Plan:      Psoriasis  - skin & joints improved on humira, but with some residual activity  - continue Humira 40 mg [place] every other week  - continue triamcinolone ointment BID PRN  - start fluocinolone 0.01 % external oil; Apply topically daily as needed. Use for psoriasis and scalp scaling.  - Counseled on appropriate use of topical steroids. Discussed the common side effects of topical steroids including epidermal atrophy, striae formation, hypopigmentation.    High risk medication use, humira  - tolerating humira well w/o side effects  - Quant gold negative 09/25/21    The patient was advised to call for an appointment should any new, changing, or symptomatic lesions develop.     RTC: Return in about 7 months (around 10/21/2022) for psoriasis follow up. or sooner as needed   _________________________________________________________________      Chief Complaint     Chief Complaint   Patient presents with   ??? Follow-up     Psoriasis no flares no areas of concern vno pain        HPI     Megan Robertson is a 51 y.o. female who presents as a returning patient (last seen 09/20/2021) to Dermatology for follow up of psoriasis.     At last visit, the plan was:  - restart Humira 80 mg [place] x 1 then 40 mg  every other week thereafter  - prescribed triamcinolone ointment to apply BID  - insurance does not cover clobetasol, halobetasol, or augmented betamethasone    Today, patient reports:  - skin and joints improved on humira  - persistent areas include postauricular and lumbosacral areas  - no issues w/ injections or injection site reactions, no increase in infections/sickness    The patient denies any other new or changing lesions or areas of concern.     Pertinent Past Medical History     Psoriasis history:  Long history of psoriasis (first manifested around age 32). ??Has been treated with topical steroids, light, MTX (many years ago; injections, pills d/c because of GI upset) and Enbrel (d/c??2014??because of improving disease and insurance changes?).??  ??  ESRD  HLD  HTN  Asthma  FSGS    Past Medical History, Family History, Social History, Medication List, Allergies, and Problem List were reviewed in the rooming section of Epic.     ROS: Other than symptoms mentioned in the HPI, no fevers, chills, or other skin complaints    Physical Examination     GENERAL: Well-appearing female in no acute distress, resting comfortably.  NEURO: Alert and oriented, answers questions appropriately  PSYCH: Normal mood and affect  RESP: No increased work of breathing  SKIN (Focal Skin Exam): Per patient request, examination of lower back, arms, scalp, ears, upper face not covered by mask was performed  - hyperpigmented plaques w/ overlying silver scale on lumbosacral back and thin plaque on R elbow  - scaling postauricularly and along hair line    All areas not commented on are within normal limits or unremarkable  - scalp exam limited by hair  - nail exam limited by artificial nails  - skin exam limited by medical device (PD catheter belt)      (Approved Template 07/04/2020)

## 2022-03-21 NOTE — Unmapped (Signed)
It was nice to see you today! Your resident physician was Dr. Hollis Oh    If any of your medications are too expensive, look for a coupon at GoodRx.com or reference the application on a smartphone  - Enter the medication name, size, and your zip code to find coupons for local pharmacies.   - Print a coupon and bring it to the pharmacy, or pull up the coupon on a smartphone.  - You can also call pharmacies to ask about the cost of your medication before you pick it up.  If you still cannot afford your medication, please let us know.     Please call our clinic at [phone] with any concerns or to schedule a follow up appointment. We look forward to seeing you again!    [facility] releases most results to you as soon as they are available. Therefore, you may see some results before we do. Please give us 2 business days to review the tests and contact you by phone or through MyChart. If you are concerned that some results may be upsetting or confusing, you may wish to wait until we contact you before looking at the report in MyChart. If you have an urgent question, you can send us a message or call our clinic. Otherwise, we prefer that you wait 2 business days for us to contact you.

## 2022-03-23 ENCOUNTER — Ambulatory Visit: Admit: 2022-03-23 | Discharge: 2022-03-23 | Disposition: A | Discharge status: Home / Self Care | Payer: MEDICARE

## 2022-03-23 ENCOUNTER — Emergency Department: Admit: 2022-03-23 | Discharge: 2022-03-23 | Disposition: A | Discharge status: Home / Self Care | Payer: MEDICARE

## 2022-03-23 LAB — COMPREHENSIVE METABOLIC PANEL
ALBUMIN: 3.1 g/dL — ABNORMAL LOW (ref 3.4–5.0)
ALKALINE PHOSPHATASE: 151 U/L — ABNORMAL HIGH (ref 46–116)
ALT (SGPT): 8 U/L — ABNORMAL LOW (ref 10–49)
ANION GAP: 10 mmol/L (ref 5–14)
AST (SGOT): 17 U/L (ref <=34)
BILIRUBIN TOTAL: 0.2 mg/dL — ABNORMAL LOW (ref 0.3–1.2)
BLOOD UREA NITROGEN: 37 mg/dL — ABNORMAL HIGH (ref 9–23)
BUN / CREAT RATIO: 6
CALCIUM: 8.8 mg/dL (ref 8.7–10.4)
CHLORIDE: 110 mmol/L — ABNORMAL HIGH (ref 98–107)
CO2: 20.8 mmol/L (ref 20.0–31.0)
CREATININE: 5.94 mg/dL — ABNORMAL HIGH
EGFR CKD-EPI (2021) FEMALE: 8 mL/min/{1.73_m2} — ABNORMAL LOW (ref >=60)
GLUCOSE RANDOM: 91 mg/dL (ref 70–179)
POTASSIUM: 4 mmol/L (ref 3.4–4.8)
PROTEIN TOTAL: 6.9 g/dL (ref 5.7–8.2)
SODIUM: 141 mmol/L (ref 135–145)

## 2022-03-23 LAB — BASIC METABOLIC PANEL
ANION GAP: 11 mmol/L (ref 5–14)
BLOOD UREA NITROGEN: 37 mg/dL — ABNORMAL HIGH (ref 9–23)
BUN / CREAT RATIO: 6
CALCIUM: 8.7 mg/dL (ref 8.7–10.4)
CHLORIDE: 110 mmol/L — ABNORMAL HIGH (ref 98–107)
CO2: 20.7 mmol/L (ref 20.0–31.0)
CREATININE: 5.96 mg/dL — ABNORMAL HIGH
EGFR CKD-EPI (2021) FEMALE: 8 mL/min/{1.73_m2} — ABNORMAL LOW (ref >=60)
GLUCOSE RANDOM: 92 mg/dL (ref 70–179)
POTASSIUM: 4 mmol/L (ref 3.4–4.8)
SODIUM: 142 mmol/L (ref 135–145)

## 2022-03-23 LAB — CBC W/ AUTO DIFF
BASOPHILS ABSOLUTE COUNT: 0.1 10*9/L (ref 0.0–0.1)
BASOPHILS RELATIVE PERCENT: 0.9 %
EOSINOPHILS ABSOLUTE COUNT: 0.2 10*9/L (ref 0.0–0.5)
EOSINOPHILS RELATIVE PERCENT: 1.9 %
HEMATOCRIT: 38.6 % (ref 34.0–44.0)
HEMOGLOBIN: 12.9 g/dL (ref 11.3–14.9)
LYMPHOCYTES ABSOLUTE COUNT: 4.4 10*9/L — ABNORMAL HIGH (ref 1.1–3.6)
LYMPHOCYTES RELATIVE PERCENT: 38.9 %
MEAN CORPUSCULAR HEMOGLOBIN CONC: 33.5 g/dL (ref 32.0–36.0)
MEAN CORPUSCULAR HEMOGLOBIN: 29.9 pg (ref 25.9–32.4)
MEAN CORPUSCULAR VOLUME: 89.2 fL (ref 77.6–95.7)
MEAN PLATELET VOLUME: 7.2 fL (ref 6.8–10.7)
MONOCYTES ABSOLUTE COUNT: 0.7 10*9/L (ref 0.3–0.8)
MONOCYTES RELATIVE PERCENT: 5.7 %
NEUTROPHILS ABSOLUTE COUNT: 6 10*9/L (ref 1.8–7.8)
NEUTROPHILS RELATIVE PERCENT: 52.6 %
NUCLEATED RED BLOOD CELLS: 0 /100{WBCs} (ref <=4)
PLATELET COUNT: 283 10*9/L (ref 150–450)
RED BLOOD CELL COUNT: 4.33 10*12/L (ref 3.95–5.13)
RED CELL DISTRIBUTION WIDTH: 13.1 % (ref 12.2–15.2)
WBC ADJUSTED: 11.4 10*9/L — ABNORMAL HIGH (ref 3.6–11.2)

## 2022-03-23 LAB — PROTIME-INR
INR: 0.98
PROTIME: 11.1 s (ref 9.8–12.8)

## 2022-03-23 LAB — APTT
APTT: 30.8 s (ref 25.1–36.5)
HEPARIN CORRELATION: 0.2

## 2022-03-23 LAB — CALCIUM: CALCIUM: 8.7 mg/dL (ref 8.7–10.4)

## 2022-03-23 MED ORDER — OXYCODONE 5 MG TABLET
ORAL_TABLET | Freq: Three times a day (TID) | ORAL | 0 refills | 4 days | Status: CP | PRN
Start: 2022-03-23 — End: 2022-03-26

## 2022-03-23 MED ADMIN — oxyCODONE-acetaminophen (PERCOCET) 5-325 mg tablet 1 tablet: 1 | ORAL | @ 06:00:00 | Stop: 2022-03-23

## 2022-03-23 MED ADMIN — oxyCODONE (ROXICODONE) immediate release tablet 5 mg: 5 mg | ORAL | @ 07:00:00 | Stop: 2022-03-23

## 2022-03-23 NOTE — Unmapped (Signed)
Bed: HALL-01A  Expected date:   Expected time:   Means of arrival:   Comments:

## 2022-03-23 NOTE — Unmapped (Signed)
ED Progress Note    7:02 AM Signout received from outgoing provider.     This is a 51 y.o. female with a history of ovarian cancer, stroke, ESRD on dialysis, GERD, gout, asthma, hypertension, and hypercholesterolemia who presents with a few days of left arm pain with associated numbness, decreased sensation, and decreased strength in her left hand concerning for PE.     ED Course as of 03/23/22 1259   Fri Mar 23, 2022   1055 10:55 AM Prelim read on PVL does not show evidence of DVT.        Discussed with vascular surgery. Her graft is patent, vascular surgery is not concerned about occlusion or DVT at this time. Plan for continuation of Eliquis 5mg  at home. Nephro okay with discharge as well. Patient to do PD at home. Prescribing 10 tabs of oxycodone as needed for pain.     Paula Compton, MD, PhD  Internal Medicine & Pediatrics, PGY-1  [facility]   [phone]

## 2022-03-23 NOTE — Unmapped (Signed)
[facility]  Emergency Department Provider Note        ED Clinical Impression     Final diagnoses:   Left arm pain (Primary)       ED Assessment/Plan   This is a 51 y.o. female with a history of ovarian cancer, stroke, ESRD on dialysis, GERD, gout, asthma, hypertension, and hypercholesterolemia who presents with a few days of left arm pain with associated numbness, decreased sensation, and decreased strength in her left hand.    On arrival, she is tachycardic to 111. Otherwise, her vitals are relatively reassuring. Upon entering the room, the patient is non toxic-appearing and in NAD. Normal cardiopulmonary exam. LUE with mild non-pitting edema to the elbow. Warmth without erythema. No bruit or thrill appreciated. Brisk capillary refill. Contractures of the left hand. Subjective decreased sensation to light touch in all digits on the ulnar and radial aspects.     Concern for DVT. Patient will also need peritoneal dialysis tonight and possible vascular surgery evaluation if there is e/o extensive clot burden again on imaging.     Will plan for transfer to main campus. Pt in agreement with plan. Transfer accepted by Dr. Ardelle Anton.          Discussion of Management with other Physicians, QHP or Appropriate Source: Ardelle Anton, [facility] attending  Independent Interpretation of Studies: EKG  N/A; RAD  N/A, POCUS  N/A  External Records Reviewed: Patient's most recent outpatient clinic note  Escalation of Care, Consideration of Admission/Observation/Transfer: Transfer as above  Social determinants that significantly affected care: None applicable  Prescription drug(s) considered but not prescribed: Heparin/lovenox not indicated at this time  Diagnostic tests considered but not performed: Unable to obtain PVL, will transfer as above  History obtained from other sources: Family      History     Chief Complaint   Patient presents with   ??? Arm Pain       Megan Robertson- 51 y.o. female with a history of ovarian cancer, stroke, ESRD on dialysis, GERD, gout, asthma, hypertension, and hypercholesterolemia who presents with arm pain. The patient reports a few days of left arm pain that begins at her elbow and travels to the tips of her fingers. She also endorses associated numbness, decreased sensation, and decreased strength in her left hand. Of note, she had a fistula graft placed in her left arm 2 years ago during which time she developed blood clots in the area that required surgical intervention. She gets peritoneal dialysis at home. She denies weakness or tingling elsewhere. She further denies chest pain or fevers.        Past Medical History:   Diagnosis Date   ??? Abnormal mammogram    ??? Abnormal Pap smear of cervix    ??? Allergic    ??? Anemia    ??? Anxiety    ??? Arthritis    ??? Asthma    ??? Back pain    ??? Cancer (CMS-HCC) Ovarian   ??? Depression    ??? Eczema    ??? ESRD (end stage renal disease) on dialysis (CMS-HCC)    ??? FSGS (focal segmental glomerulosclerosis) 1997    renal biopsy at [facility]   ??? GERD (gastroesophageal reflux disease)    ??? Gout    ??? H/O adenoidectomy     had adenoids removed   ??? Hypercholesteremia    ??? Hypertension    ??? Keloid    ??? Ovarian cancer (CMS-HCC)    ???  Pancreatitis    ??? Psoriasis    ??? Seizure (CMS-HCC)     unknown etiology; none for several years   ??? Stroke (CMS-HCC) 2000       Past Surgical History:   Procedure Laterality Date   ??? APPENDECTOMY     ??? BREAST EXCISIONAL BIOPSY Right 11/28/2016   ??? CHEMOTHERAPY      for ovarian CA in 2000   ??? CHG US GUIDE, VASCULAR ACCESS N/A 01/29/2020    Procedure: ULTRASOUND GUIDANCE FOR VASC ACCESS REQUIRING [place] EVAL OF POTENTIAL ACCESS SITES;  Surgeon: Leona Carry, MD;  Location: MAIN OR [facility];  Service: Transplant   ??? CHG US GUIDE, VASCULAR ACCESS N/A 06/08/2020    Procedure: ULTRASOUND GUIDANCE FOR VASC ACCESS REQUIRING [place] EVAL OF POTENTIAL ACCESS SITES;  Surgeon: Leona Carry, MD;  Location: MAIN OR [facility];  Service: Transplant ??? CHOLECYSTECTOMY     ??? COMBINED HYSTEROSCOPY DIAGNOSTIC / D&C  07/07/2015    Planned for endometrial ablation, but patient had uterine perforation after D&C and unable to perform   ??? HYSTERECTOMY     ??? LEFT OOPHORECTOMY Left 04/12/2000    Removed with L fallopian tube for ectopic pregnancy   ??? OOPHORECTOMY     ??? PR COLONOSCOPY W/BIOPSY SINGLE/MULTIPLE  09/12/2021    Procedure: COLONOSCOPY, FLEXIBLE, PROXIMAL TO SPLENIC FLEXURE; WITH BIOPSY, SINGLE OR MULTIPLE;  Surgeon: Maris Berger, MD;  Location: [facility];  Service: Gastroenterology   ??? PR COLSC FLX W/RMVL OF TUMOR POLYP LESION SNARE TQ N/A 09/12/2021    Procedure: COLONOSCOPY FLEX; W/REMOV TUMOR/LES BY SNARE;  Surgeon: Maris Berger, MD;  Location: [facility];  Service: Gastroenterology   ??? PR CREAT AV FISTULA,AUTOGENOUS GRAFT Left 01/29/2020    Procedure: Create Av Fistula (Separt Proc); Verner Mould;  Surgeon: Leona Carry, MD;  Location: MAIN OR [facility];  Service: Transplant   ??? PR CREAT AV FISTULA,NON-AUTOGENOUS GRAFT Left 06/08/2020    Procedure: CREATE AV FISTULA (SEPARATE PROC); NONAUTOGENOUS GRAFT (EG, BIOLOGICAL COLLAGEN, THERMOPLASTIC GRAFT);  Surgeon: Leona Carry, MD;  Location: MAIN OR [facility];  Service: Transplant   ??? PR EXPLORATION N/FLWD SURG UPPER EXTREMITY ARTERY Left 08/02/2020    Procedure: Exploration Not Followed By Surgical Repair, Artery; Upper Extremity (Eg, Axillary, Brachial, Radial, Ulnar);  Surgeon: Earney Mallet, MD;  Location: MAIN OR [facility];  Service: Vascular   ??? PR INCIS/DRAIN ARM,DEEP ABSC/HEMATOMA Left 08/14/2020    Procedure: INCISION AND DRAINAGE, UPPER ARM OR ELBOW AREA; DEEP ABSCESS OR HEMATOMA;  Surgeon: Earney Mallet, MD;  Location: MAIN OR [facility];  Service: Vascular   ??? PR INSERTION TUNNEL INTRAPERITONEAL CATH DIAL OPEN Midline 02/01/2021    Procedure: INSERTION OF INTRAPERITONEAL CANNULA OR CATHETER FOR DRAINAGE OR DIALYSIS; PERMANENT;  Surgeon: Loney Hering, MD;  Location: MAIN OR [facility];  Service: Transplant   ??? PR REMV ART CLOT AXILL-BRACH,ARM INCIS Left 08/02/2020    Procedure: Embolect/Thrombec; Axilry/Brachial Art-Arm Incs;  Surgeon: Earney Mallet, MD;  Location: MAIN OR [facility];  Service: Vascular   ??? PR UPPER GI ENDOSCOPY,BIOPSY N/A 08/29/2017    Procedure: UGI ENDOSCOPY; WITH BIOPSY, SINGLE OR MULTIPLE;  Surgeon: Neysa Hotter, MD;  Location: GI PROCEDURES [place] [facility];  Service: Gastroenterology   ??? PR VEIN BYPASS GRAFT,SUBCL-BRACHIAL Left 08/05/2020    Procedure: Bypass Graft, With Vein; Subclavian-Brachial;  Surgeon: Earney Mallet, MD;  Location: MAIN OR [facility];  Service: Vascular   ??? SALPINGECTOMY Left 04/12/2000    Ectopic pregnancy   ???  SALPINGECTOMY Right 11/22/2015    at time of total vaginal hysterectomy   ??? TONSILECTOMY, ADENOIDECTOMY, BILATERAL MYRINGOTOMY AND TUBES     ??? TOTAL VAGINAL HYSTERECTOMY  11/22/2015    With R salpingectomy       Family History   Adopted: Yes   Problem Relation Age of Onset   ??? Heart attack Mother 51   ??? Alcohol abuse Mother    ??? Mental illness Mother    ??? No Known Problems Father    ??? No Known Problems Sister    ??? No Known Problems Daughter    ??? No Known Problems Maternal Grandmother    ??? No Known Problems Maternal Grandfather    ??? No Known Problems Paternal Grandmother    ??? No Known Problems Paternal Grandfather    ??? No Known Problems Brother    ??? No Known Problems Son    ??? No Known Problems Maternal Aunt    ??? No Known Problems Maternal Uncle    ??? No Known Problems Paternal Aunt    ??? No Known Problems Paternal Uncle    ??? No Known Problems Other    ??? Anesthesia problems Neg Hx    ??? Broken bones Neg Hx    ??? Cancer Neg Hx    ??? Clotting disorder Neg Hx    ??? Collagen disease Neg Hx    ??? Diabetes Neg Hx    ??? Dislocations Neg Hx    ??? Fibromyalgia Neg Hx    ??? Gout Neg Hx    ??? Hemophilia Neg Hx    ??? Osteoporosis Neg Hx    ??? Rheumatologic disease Neg Hx    ??? Scoliosis Neg Hx    ??? Severe sprains Neg Hx    ??? Sickle cell anemia Neg Hx    ??? Spinal Compression Fracture Neg Hx    ??? Melanoma Neg Hx    ??? Basal cell carcinoma Neg Hx    ??? Squamous cell carcinoma Neg Hx    ??? Breast cancer Neg Hx        Social History     Socioeconomic History   ??? Marital status: Married     Spouse name: None   ??? Number of children: None   ??? Years of education: None   ??? Highest education level: None   Tobacco Use   ??? Smoking status: Former     Packs/day: 2.00     Years: 1.00     Pack years: 2.00     Types: Cigarettes   ??? Smokeless tobacco: Never   Vaping Use   ??? Vaping Use: Never used   Substance and Sexual Activity   ??? Alcohol use: No   ??? Drug use: No   ??? Sexual activity: Yes     Partners: Male     Birth control/protection: Bilateral Tubal Ligation   Other Topics Concern   ??? Do you use sunscreen? No   ??? Tanning bed use? No   ??? Are you easily burned? No   ??? Excessive sun exposure? No   ??? Blistering sunburns? No   Social History Narrative    on disability since 1994, but does not qualify anymore since she got married and her husband has income.     Lives in [place] with husband, Lisbeth Ply and 2 children       No current facility-administered medications for this encounter.     Current Outpatient Medications   Medication Sig Dispense Refill   ???  ADALIMUMAB PEN CITRATE FREE 40 MG/0.4 ML Inject the contents of 1 pen (40 mg total) under the skin every fourteen (14) days. 6 each 3   ??? adalimumab 80 mg/0.8 mL-40 mg/0.4 mL PnKt Inject the contents of 1 pen (80mg ) under the skin for one dose, THEN 1 pen (40mg ) every 14 days thereafter. 3 each 0   ??? allopurinoL (ZYLOPRIM) 100 MG tablet Take 1 tablet (100 mg total) by mouth Every Tuesday, Thursday and Saturday. 36 tablet 3   ??? amLODIPine (NORVASC) 10 MG tablet Take 1 tablet (10 mg total) by mouth daily. 90 tablet 0   ??? apixaban (ELIQUIS) 5 mg Tab Take 1 tablet (5 mg total) by mouth in the morning. 30 tablet 0   ??? aspirin 81 MG chewable tablet Chew 1 tablet (81 mg total) daily. 36 tablet 0   ??? atorvastatin (LIPITOR) 80 MG tablet Take 1 tablet (80 mg total) by mouth daily. 30 tablet 0   ??? beclomethasone dipropionate (QVAR REDIHALER) 80 mcg/actuation inhaler 1 puff every morning, 2 puffs every evening (Patient taking differently: continuous as needed. 1 puff every morning, 2 puffs every evening as needed) 10.6 g 0   ??? evolocumab (REPATHA SURECLICK) 140 mg/mL PnIj Inject the contents of 1 pen (140 mg) under the skin every fourteen (14) days. 6 mL 3   ??? fluocinolone 0.01 % external oil Apply topically daily as needed. Use for psoriasis and scalp scaling. 120 mL 5   ??? furosemide (LASIX) 40 MG tablet Take 1 tablet (40 mg total) by mouth 3 (three) times a week. To be taken once a day if you develop leg swelling. (Patient not taking: Reported on 03/20/2022) 36 tablet 3   ??? isosorbide mononitrate (IMDUR) 120 MG 24 hr tablet Take 1 tablet (120 mg total) by mouth daily. 30 tablet 0   ??? sevelamer (RENVELA) 800 mg tablet Take 1 tablet (800 mg total) by mouth.     ??? triamcinolone (KENALOG) 0.1 % ointment Apply topically Two (2) times a day. To psoriasis spots. Stop when skin is clear 454 g 2       Review of Systems  Constitutional: Negative for fever.  Eyes: Negative for visual changes.  ENT: Negative for sore throat.  Cardiovascular: Negative for chest pain.  Respiratory: Negative for shortness of breath.  Gastrointestinal: Negative for abdominal pain, vomiting or diarrhea.  Genitourinary: Negative for dysuria.  Musculoskeletal: Positive for arm pain.   Skin: Negative for rash.  Neurological: Negative for headaches, weakness or numbness.    Physical Exam     BP 144/90  - Pulse 111  - Temp 37 ??C (98.6 ??F) (Oral)  - Resp 18  - Ht 157.5 cm (5' 2)  - Wt 91.2 kg (201 lb)  - LMP  (LMP Unknown)  - SpO2 99%  - BMI 36.76 kg/m??     Constitutional: Alert and oriented. Non toxic-appearing and in NAD.   Eyes: Conjunctivae are normal.  ENT       Head: Normocephalic and atraumatic.       Nose: No congestion.       Mouth/Throat: Mucous membranes are moist.       Neck: No stridor, neck is supple.  Hematological/Lymphatic/Immunilogical: No cervical lymphadenopathy.  Cardiovascular: Normal rate, regular rhythm. No m/g/r. Normal and symmetric distal pulses are present in all extremities. No bruit or thrill appreciated. Brisk capillary refill.   Respiratory: Normal respiratory effort. Breath sounds are normal. No wheezes, rales, rhonchi.  Gastrointestinal: Soft and nontender.  Genitourinary: Deferred.   Musculoskeletal: LUE with mild non-pitting edema to the elbow. Warmth without erythema. Contractures of the left hand.       Right lower leg: No tenderness or edema.       Left lower leg: No tenderness or edema.  Neurologic: Normal speech and language.  Subjective decreased sensation to light touch in all digits on the ulnar and radial aspects.   Skin: Skin is warm, dry and intact. No rash noted.  Psychiatric: Mood and affect are normal. Speech and behavior are normal.           Documentation assistance was provided by Johnathan Hausen, Scribe on March 23, 2022 at 1:11 AM for Idalia Needle, MD.     March 23, 2022 1:11 AM. Documentation assistance provided by the scribe. I was present during the time the encounter was recorded. The information recorded by the scribe was done at my direction and has been reviewed and validated by me.             Jamas Lav, MD  03/24/22 1329

## 2022-03-23 NOTE — Unmapped (Addendum)
Pt endorsing numbness, pain and tingling in the L forearm, wrist, and hand. Started three days ago. No known injury. Denies hx of arthritis or carpel tunnel. States that she has had an attempted dialysis fistula in the same arm, had clots in the arm afterward (approx 1 year ago). Concerned for another blood clot. PMS intact distally, cap refill <3sec, warm to touch.

## 2022-03-23 NOTE — Unmapped (Cosign Needed)
Vascular Surgery Consult Note    Requesting Attending Physician:  No att. providers found  Service Requesting Consult:  Emergency Medicine  Service Providing Consult: Vascular Surgery  Consulting Attending: Coralee Rud    Assessment:  Megan Robertson is a 51 y.o. female with history ESRD on PD and chronic left hand ischemia  Who presented in October 2021??with acute on chronic LUE limb threatening ischemia s/p thromboembolectomy of L axillary and brachial artery, and explantation of LUE BB AVG on 10/12 followed by L subclavian to distal brachial artery venous bypass with thromboembolectomy and RLE GSV harvest on 08/05/20 c/b by LUE surgical site infection s/p excisional debridement and wound vac placement on 10/24. Subsequently she underwent LUE angiogram on 01/2021 with balloon angioplasty of the distal anastomosis and left brachial artery. Today on exam she has numbness and tingling, swelling, and diminished brachial and radial doppler signals. Venous duplex is without DVT. Arterial Duplex shows patent bypass graft with native distal brachial artery with stenosis distal to the bypass. She does have flow in the radial and ulnar artery although the waveforms do show diminished flow. At this time we do not have any further revascularization options and we have discussed with her in the past that due to the level of her disease the bypass graft may eventually occluded. We recommend ongoing medical management with anticoagulation as she has been doing.        PLAN:   -No indication for surgical intervention or admission as there are no revascularization options at this time  -Recommend therapeutic anticoagulation with Eliquis     If you have any questions, concerns or changes in the patient's clinical status, please feel free to contact SRV consult pager [phone]. Thank you for this interesting consult.    Maryland Pink, MD  Vascular Surgery PGY2    History of Present Illness:   Chief Complaint:  LUE weakness, swelling, and function    Megan Robertson is a 51 y.o. female who is seen in consultation for LUE weakness, swelling, and function  at the request of No att. providers found on the Emergency Medicine service.     Megan Robertson is 51 y.o. female with history as stated below who presented to [facility] yesterday following 3 days of left arm heaviness, numbness and tingling, and decreased strength. She states that the weakness is waxing and waning over the past 3 days. She also reports that her arm feels very heavy and the tightness is preventing her from being able to fully use her hand. She has been taking her eliquis as prescribed everyday. She has also transitioned to PD dialysis and has been doing that at home but she missed her session yesterday.       Past Medical History:   Past Medical History:   Diagnosis Date   ??? Abnormal mammogram    ??? Abnormal Pap smear of cervix    ??? Allergic    ??? Anemia    ??? Anxiety    ??? Arthritis    ??? Asthma    ??? Back pain    ??? Cancer (CMS-HCC) Ovarian   ??? Depression    ??? Eczema    ??? ESRD (end stage renal disease) on dialysis (CMS-HCC)    ??? FSGS (focal segmental glomerulosclerosis) 1997    renal biopsy at [facility]   ??? GERD (gastroesophageal reflux disease)    ??? Gout    ??? H/O adenoidectomy     had adenoids removed   ??? Hypercholesteremia    ???  Hypertension    ??? Keloid    ??? Ovarian cancer (CMS-HCC)    ??? Pancreatitis    ??? Psoriasis    ??? Seizure (CMS-HCC)     unknown etiology; none for several years   ??? Stroke (CMS-HCC) 2000       Past Surgical History:  Past Surgical History:   Procedure Laterality Date   ??? APPENDECTOMY     ??? BREAST EXCISIONAL BIOPSY Right 11/28/2016   ??? CHEMOTHERAPY      for ovarian CA in 2000   ??? CHG US GUIDE, VASCULAR ACCESS N/A 01/29/2020    Procedure: ULTRASOUND GUIDANCE FOR VASC ACCESS REQUIRING [place] EVAL OF POTENTIAL ACCESS SITES;  Surgeon: Leona Carry, MD;  Location: MAIN OR [facility];  Service: Transplant   ??? CHG US GUIDE, VASCULAR ACCESS N/A 06/08/2020    Procedure: ULTRASOUND GUIDANCE FOR VASC ACCESS REQUIRING [place] EVAL OF POTENTIAL ACCESS SITES;  Surgeon: Leona Carry, MD;  Location: MAIN OR [facility];  Service: Transplant   ??? CHOLECYSTECTOMY     ??? COMBINED HYSTEROSCOPY DIAGNOSTIC / D&C  07/07/2015    Planned for endometrial ablation, but patient had uterine perforation after D&C and unable to perform   ??? HYSTERECTOMY     ??? LEFT OOPHORECTOMY Left 04/12/2000    Removed with L fallopian tube for ectopic pregnancy   ??? OOPHORECTOMY     ??? PR COLONOSCOPY W/BIOPSY SINGLE/MULTIPLE  09/12/2021    Procedure: COLONOSCOPY, FLEXIBLE, PROXIMAL TO SPLENIC FLEXURE; WITH BIOPSY, SINGLE OR MULTIPLE;  Surgeon: Maris Berger, MD;  Location: [facility]URES [facility];  Service: Gastroenterology   ??? PR COLSC FLX W/RMVL OF TUMOR POLYP LESION SNARE TQ N/A 09/12/2021    Procedure: COLONOSCOPY FLEX; W/REMOV TUMOR/LES BY SNARE;  Surgeon: Maris Berger, MD;  Location: [facility];  Service: Gastroenterology   ??? PR CREAT AV FISTULA,AUTOGENOUS GRAFT Left 01/29/2020    Procedure: Create Av Fistula (Separt Proc); Verner Mould;  Surgeon: Leona Carry, MD;  Location: MAIN OR [facility];  Service: Transplant   ??? PR CREAT AV FISTULA,NON-AUTOGENOUS GRAFT Left 06/08/2020    Procedure: CREATE AV FISTULA (SEPARATE PROC); NONAUTOGENOUS GRAFT (EG, BIOLOGICAL COLLAGEN, THERMOPLASTIC GRAFT);  Surgeon: Leona Carry, MD;  Location: MA[facility] OR [facility];  Service: Transplant   ??? PR EXPLORATION N/FLWD SURG UPPER EXTREMITY ARTERY Left 08/02/2020    Procedure: Exploration Not Followed By Surgical Repair, Artery; Upper Extremity (Eg, Axillary, Brachial, Radial, Ulnar);  Surgeon: Earney Mallet, MD;  Location: MAIN OR [facility];  Service: Vascular   ??? PR INCIS/DRAIN ARM,DEEP ABSC/HEMATOMA Left 08/14/2020    Procedure: INCISION AND DRAINAGE, UPPER ARM OR ELBOW AREA; DEEP ABSCESS OR HEMATOMA;  Surgeon: Earney Mallet, MD;  Location: MAIN OR [facility];  Service: Vascular   ??? PR INSERTION TUNNEL INTRAPERITONEAL CATH DIAL OPEN Midline 02/01/2021    Procedure: INSERTION OF INTRAPERITONEAL CANNULA OR CATHETER FOR DRAINAGE OR DIALYSIS; PERMANENT;  Surgeon: Loney Hering, MD;  Location: MAIN OR [facility];  Service: Transplant   ??? PR REMV ART CLOT AXILL-BRACH,ARM INCIS Left 08/02/2020    Procedure: Embolect/Thrombec; Axilry/Brachial Art-Arm Incs;  Surgeon: Earney Mallet, MD;  Location: MAIN OR [facility];  Service: Vascular   ??? PR UPPER GI ENDOSCOPY,BIOPSY N/A 08/29/2017    Procedure: UGI ENDOSCOPY; WITH BIOPSY, SINGLE OR MULTIPLE;  Surgeon: Neysa Hotter, MD;  Location: GI PROCEDURES [place] [facility];  Service: Gastroenterology   ??? PR VEIN BYPASS GRAFT,SUBCL-BRACHIAL Left 08/05/2020    Procedure: Bypass Graft, With Vein; Subclavian-Brachial;  Surgeon:  Earney Mallet, MD;  Location: MAIN OR [facility];  Service: Vascular   ??? SALPINGECTOMY Left 04/12/2000    Ectopic pregnancy   ??? SALPINGECTOMY Right 11/22/2015    at time of total vaginal hysterectomy   ??? TONSILECTOMY, ADENOIDECTOMY, BILATERAL MYRINGOTOMY AND TUBES     ??? TOTAL VAGINAL HYSTERECTOMY  11/22/2015    With R salpingectomy       Medications:  No current facility-administered medications on file prior to encounter.     Current Outpatient Medications on File Prior to Encounter   Medication Sig Dispense Refill   ??? ADALIMUMAB PEN CITRATE FREE 40 MG/0.4 ML Inject the contents of 1 pen (40 mg total) under the skin every fourteen (14) days. 6 each 3   ??? adalimumab 80 mg/0.8 mL-40 mg/0.4 mL PnKt Inject the contents of 1 pen (80mg ) under the skin for one dose, THEN 1 pen (40mg ) every 14 days thereafter. 3 each 0   ??? allopurinoL (ZYLOPRIM) 100 MG tablet Take 1 tablet (100 mg total) by mouth Every Tuesday, Thursday and Saturday. 36 tablet 3   ??? amLODIPine (NORVASC) 10 MG tablet Take 1 tablet (10 mg total) by mouth daily. 90 tablet 0   ??? apixaban (ELIQUIS) 5 mg Tab Take 1 tablet (5 mg total) by mouth in the morning. 30 tablet 0   ??? aspirin 81 MG chewable tablet Chew 1 tablet (81 mg total) daily. 36 tablet 0   ??? atorvastatin (LIPITOR) 80 MG tablet Take 1 tablet (80 mg total) by mouth daily. 30 tablet 0   ??? beclomethasone dipropionate (QVAR REDIHALER) 80 mcg/actuation inhaler 1 puff every morning, 2 puffs every evening (Patient taking differently: continuous as needed. 1 puff every morning, 2 puffs every evening as needed) 10.6 g 0   ??? evolocumab (REPATHA SURECLICK) 140 mg/mL PnIj Inject the contents of 1 pen (140 mg) under the skin every fourteen (14) days. 6 mL 3   ??? fluocinolone 0.01 % external oil Apply topically daily as needed. Use for psoriasis and scalp scaling. 120 mL 5   ??? furosemide (LASIX) 40 MG tablet Take 1 tablet (40 mg total) by mouth 3 (three) times a week. To be taken once a day if you develop leg swelling. (Patient not taking: Reported on 03/20/2022) 36 tablet 3   ??? isosorbide mononitrate (IMDUR) 120 MG 24 hr tablet Take 1 tablet (120 mg total) by mouth daily. 30 tablet 0   ??? sevelamer (RENVELA) 800 mg tablet Take 1 tablet (800 mg total) by mouth.     ??? triamcinolone (KENALOG) 0.1 % ointment Apply topically Two (2) times a day. To psoriasis spots. Stop when skin is clear 454 g 2       Allergies:  Allergies   Allergen Reactions   ??? Cephalexin Itching     unknown; tolerates cefepime   ??? Mango Itching   ??? Peach (Prunus Persica) Itching   ??? Wellbutrin [Bupropion Hcl] Other (See Comments)     Seizure   ??? Egg Itching   ??? Fish Containing Products Itching   ??? Lactase-Rennet Nausea And Vomiting   ??? Lexiscan [Regadenoson] Nausea And Vomiting     Patient had nausea after the Lexi then vomitted [id number]   ??? Mushroom Itching   ??? Tomato (Solanum Lycopersicum) Itching       Family History:  Family History   Adopted: Yes   Problem Relation Age of Onset   ??? Heart attack Mother 18   ??? Alcohol abuse Mother    ???  Mental illness Mother    ??? No Known Problems Father    ??? No Known Problems Sister    ??? No Known Problems Daughter    ??? No Known Problems Maternal Grandmother    ??? No Known Problems Maternal Grandfather    ??? No Known Problems Paternal Grandmother    ??? No Known Problems Paternal Grandfather    ??? No Known Problems Brother    ??? No Known Problems Son    ??? No Known Problems Maternal Aunt    ??? No Known Problems Maternal Uncle    ??? No Known Problems Paternal Aunt    ??? No Known Problems Paternal Uncle    ??? No Known Problems Other    ??? Anesthesia problems Neg Hx    ??? Broken bones Neg Hx    ??? Cancer Neg Hx    ??? Clotting disorder Neg Hx    ??? Collagen disease Neg Hx    ??? Diabetes Neg Hx    ??? Dislocations Neg Hx    ??? Fibromyalgia Neg Hx    ??? Gout Neg Hx    ??? Hemophilia Neg Hx    ??? Osteoporosis Neg Hx    ??? Rheumatologic disease Neg Hx    ??? Scoliosis Neg Hx    ??? Severe sprains Neg Hx    ??? Sickle cell anemia Neg Hx    ??? Spinal Compression Fracture Neg Hx    ??? Melanoma Neg Hx    ??? Basal cell carcinoma Neg Hx    ??? Squamous cell carcinoma Neg Hx    ??? Breast cancer Neg Hx        Social History:   Social History     Tobacco Use   ??? Smoking status: Former     Packs/day: 2.00     Years: 1.00     Pack years: 2.00     Types: Cigarettes   ??? Smokeless tobacco: Never   Vaping Use   ??? Vaping Use: Never used   Substance Use Topics   ??? Alcohol use: No   ??? Drug use: No       Review of Systems  10 systems were reviewed and are negative except as noted specifically in the HPI.    Objective  Vitals:   Temp:  [37 ??C (98.6 ??F)-37.1 ??C (98.7 ??F)] 37.1 ??C (98.7 ??F)  Heart Rate:  [89-111] 89  SpO2 Pulse:  [54-98] 54  Resp:  [18-20] 18  BP: (139-167)/(73-98) 139/85  MAP (mmHg):  [102-118] 102  SpO2:  [97 %-100 %] 97 %  BMI (Calculated):  [36.75] 36.75      Intake/Output last 24 hours:  No intake or output data in the 24 hours ending 03/23/22 0942    Physical Exam:    General: chronically ill appeaing, well-nourished, resting comfortably in no acute distress  Eyes: Sclera anicteric, EOM intact  ENT: Nares without discharge, moist mucous membranes, trachea midline   Cardiac: Regular rate and rhythm, no appreciable murmurs   Pulmonary: Non labored breathing, stable on room air  Abdomen: Soft, non-tender, non distended, PD catheter in place  Extremities: BUE warm, Left arm with moderate swelling and monophasic brachial and radial signals in the left arm. No ulnar signal found but intact flow noted through the palmar arch. 4/5 motor strength in the left hand.   Neuro: Alert and oriented x3    Pertinent Diagnostic Tests:  I have reviewed the labs and studies from the last 24 hours.  Imaging:  PVL Arterial Duplex Upper Extremity Left    Result Date: 03/23/2022    Peripheral Vascular Lab     [address]   [place], [place] [address]  PVL ARTERIAL DUPLEX UPPER EXTREMITY LEFT Patient Demographics Pt. Name: KHLOEI ANN -Caulfield Location: PVL Inpatient Lab MRN:      [id number]                   Sex:      F DOB:      [birth year]                 Age:      2 years  Study Information Authorizing Provider [phone] [facility]        Performed Time       03/23/2022 9:50:33 Name                 GUARINO                                   AM Ordering Physician   Cheryle Horsfall       Patient Location     [facility] Accession Number     [id number]        Technologist         Glennon Hamilton                                                                RVT Diagnosis:                                Assisting                                           Technologist Ordered Reason For Exam: r/o arterial thromboembolism  Other Indication: R/o arterial thromboembolism. History:          H/o multiple failed dialysis access grafts with left UE AVG                   explantation on 08/02/2020 followed by left                   subclavian-brachial artery vein graft on 08/05/2020. s/p                   balloon angioplasty of DAG of BPG and native brachial artery                   on 02/09/2021.  Risk Factors: Hypertension, hyperlipidemia, and smoking (former 1 ppd x 2 yrs               quit in 2016).  Examination Protocol: Duplex (color and spectral Doppler) is used to assess specific areas of concern per ordering physician. If no specific area is identified in the order, color Doppler and spectral Doppler is used to evaluate and document bilateral brachial, radial, and ulnar arteries. Additional color and spectral Doppler waveforms may be obtained from the axillary and subclavian artery, or other segements of  the arterial tree at the discretion of the technologist/ordering physician. Blood pressures may be obtained from one or more levels of the arm bilaterally to support duplex findings. Physiologic PPG waveforms were obtained from the digits in the presence of hand or finger symptoms. Findings  Left: The subclavian-brachial BPG is patent with mid BPG near axilla PSV of 42       cm/s. Proximal native brachial artery PSV is 51 cm/s (V1). There is a       stenosis at mid upper arm in the native brachial artery, distal to the BPG       with a PSV of 190 cm/s, VR of 3.7 suggestive of 75-99% stenosis. There is       a second narrowing of the brachial artery with an elevated velocity of 131       cm/s and mild PST. The radial and ulnar arteries have diminished Doppler       waveforms. PPG tracings from the 1st and 4th finger demonstrate diminished       amplitude.  Summary of Findings Right: Not evaluated. Left: The subclavian-brachial BPG is patent. There is a stenosis of 75-99%       stenosis in the native brachial artery mid UA. Diminished radial and ulnar       artery waveforms. PPG tracings from the 1st and 4th finger demonstrate       diminished amplitude.   --------------------------------------------------------------------------------   Preliminary     PVL Venous Duplex Upper Extremity Left    Result Date: 03/23/2022   Peripheral Vascular Lab     [address]   [place], [place] [address]  PVL VENOUS DUPLEX UPPER EXTREMITY LEFT Patient Demographics Pt. Name: Doshia ANN -Spike Location: PVL Inpatient Lab MRN:      [id number] Sex:      F DOB:      November 22, 1970                 Age:      67 years  Study Information Authorizing Provider [phone] [facility]        Performed Time       03/23/2022 9:24:36 Name                 GUARINO                                   AM Ordering Physician   Cheryle Horsfall       Patient Location     [facility] Accession Number     [id number]        Technologist         Glennon Hamilton                                                                RVT Diagnosis:                                Assisting  Technologist Ordered Reason For Exam: LUE pain, concern for DVT, hx of prior DVT in LUE Other Indication: LUE pain, concern for DVT, hx of prior DVT in LUE Risk Factors: DVT (Hx of DVT LUE 03/24/20 1 of 2 brachial veins and basilic vein) and (Hx of occluded LUE AVF). Anticoagulation: (Eliquis).  Examination Protocol The internal jugular, brachiocephalic, subclavian, and axillary, brachial, basilic and cephalic veins are routinely assessed on the requested limb. Spectral and Color Doppler data is the primary method for evaluating the brachiocephalic and subclavian veins. Venous compression is used to evaluate the internal jugular, axillary, and upper arm veins. If a unilateral exam is requested, a contralateral subclavian vein Doppler signal is required for comparison.  Limitations:  Duplex Findings Right Subclavian vein Doppler signal appears within normal limits. Left No abnormality of venous architecture is observed in the central veins. All Doppler signals are appropriately pulsatile with ventilatory excursions. Color Doppler notes appropriate filling in all vessels. The arm veins appear fully compressible. Pt has small caliper vessels, likely due to previous AVF.  Summary of Findings Right Normal subclavian vein Doppler signal suggests proximal central venous patency. Left No evidence of obstruction was seen in the central veins or arm veins. Pt has small caliper vessels, likely due to previous AVF.  Final Interpretation Right Subclavian vein Doppler signal is within normal limits. This indirect finding suggests central vein patency. Left No evidence of DVT detected in the central veins or arm veins.  Electronically signed by [id number] Jodell Cipro MD on 03/23/2022 at 11:03:05 AM.   Final

## 2022-03-23 NOTE — Unmapped (Signed)
[facility]  Emergency Department Progress Note for Continuation of Care      March 23, 2022 5:26 AM    Megan Robertson is a 51 y.o. female who has a past medical history of Abnormal mammogram, Abnormal Pap smear of cervix, Allergic, Anemia, Anxiety, Arthritis, Asthma, Back pain, Cancer (CMS-HCC) (Ovarian), Depression, Eczema, ESRD (end stage renal disease) on dialysis (CMS-HCC), FSGS (focal segmental glomerulosclerosis) (1997), GERD (gastroesophageal reflux disease), Gout, H/O adenoidectomy, Hypercholesteremia, Hypertension, Keloid, Ovarian cancer (CMS-HCC), Pancreatitis, Psoriasis, Seizure (CMS-HCC), and Stroke (CMS-HCC) (2000). who presents as a transfer from the [facility] Emergency Department for evaluation of possible DVT.  Patient reports left upper extremity pain, decree sensation of the left distal extremity, difficulty moving the left digits, for the last 3 days.  Patient had a fistula graft placed in the left arm 2 years prior, felt blood clots in the area that required surgical intervention.  Patient gets peritoneal dialysis at home daily, not been dialyzed today.  Patient reports compliance with Eliquis outpatient.    ED Course as of 03/23/22 0650   Fri Mar 23, 2022   0611 Spoke with vascular. Requests arterial dopplers in addition to venous.    4540 Spoke with nephrology regarding the patients PD.  They will come and evaluate the patient after 0700 to evaluate need to PD. At this time patients labs are reassuring.    9811 Signed out to oncoming resident at 0700.  Patient is pending arterial and venous Dopplers.  Vascular is aware of the patient and will come and evaluate after their signout.  I have spoken to nephrology regarding the patient's peritoneal dialysis, plan to come and evaluate the patient and assess the need for PD after 0700.

## 2022-03-31 NOTE — Unmapped (Signed)
I saw and evaluated the patient, participating in the key portions of the service.  I reviewed the resident’s note.  I agree with the resident’s findings and plan.     Charee Tumblin, MD

## 2022-04-02 NOTE — Unmapped (Signed)
[facility] Refill Coordination Note    Specialty Medication(s) to be Shipped:   Inflammatory Disorders: Humira    Other medication(s) to be shipped: No additional medications requested for fill at this time     Arieana Somoza, DOB: 14-Jul-1971  Phone: [phone] (home)       All above HIPAA information was verified with patient.     Was a Nurse, learning disability used for this call? No    Completed refill call assessment today to schedule patient's medication shipment from the [facility] [phone]).  All relevant notes have been reviewed.     Specialty medication(s) and dose(s) confirmed: Regimen is correct and unchanged.   Changes to medications: Harli reports no changes at this time.  Changes to insurance: No  New side effects reported not previously addressed with a pharmacist or physician: None reported  Questions for the pharmacist: No    Confirmed patient received a Conservation officer, historic buildings and a Surveyor, mining with first shipment. The patient will receive a drug information handout for each medication shipped and additional FDA Medication Guides as required.       DISEASE/MEDICATION-SPECIFIC INFORMATION        For patients on injectable medications: Patient currently has 0 doses left.  Next injection is scheduled for 04/16/22.    SPECIALTY MEDICATION ADHERENCE     Medication Adherence    Patient reported X missed doses in the last month: 0  Specialty Medication: Humria (CF) 40mg /0.4mg   Patient is on additional specialty medications: No  Patient is on more than two specialty medications: No  Informant: patient        Were doses missed due to medication being on hold? No    Humira 40mg /0.64mL : 0 days of medicine on hand   REFERRAL TO PHARMACIST     Referral to the pharmacist: Not needed      [facility]     Shipping address confirmed in Epic.     Delivery Scheduled: Yes, Expected medication delivery date: 04/12/22.     Medication will be delivered via Same Day Courier to the prescription address in Epic [place].    Wyatt Mage M Elisabeth Cara   [facility] Specialty Technician

## 2022-04-02 NOTE — Unmapped (Signed)
The [facility] has made a second and final attempt to reach this patient to refill the following medication:Humria.      We have left voicemails on the following phone numbers: [phone] and have sent a text message to the following phone numbers: [phone].    Dates contacted: 03/23/22, 04/02/22  Last scheduled delivery: 02/27/22    The patient may be at risk of non-compliance with this medication. The patient should call the [facility] at [phone]  Option 4, then Option 2 (all other specialty patients) to refill medication.    Drucilla Cumber Celedonio Savage   [facility]

## 2022-04-10 NOTE — Unmapped (Signed)
Will you please let her know that since we just saw her we don't need to see her tomorrow with the duplex per jennifer belgum 6 months

## 2022-04-12 MED FILL — HUMIRA PEN CITRATE FREE 40 MG/0.4 ML: SUBCUTANEOUS | 28 days supply | Qty: 2 | Fill #3

## 2022-05-14 NOTE — Unmapped (Signed)
The [facility] has made a second and final attempt to reach this patient to refill the following medication:Humria and Repatha.      We have left voicemails on the following phone numbers: [phone] and have sent a text message to the following phone numbers: [phone].    Dates contacted: 05/04/22, 05/14/22  Last scheduled delivery: 04/12/22    The patient may be at risk of non-compliance with this medication. The patient should call the [facility] at [phone]  Option 4, then Option 2 (all other specialty patients) to refill medication.    Furious Chiarelli Celedonio Savage   [facility]

## 2022-05-21 NOTE — Unmapped (Signed)
[facility] Clinical Assessment & Refill Coordination Note    Megan Robertson, DOB: Aug 26, 1971  Phone: [phone] (home)     All above HIPAA information was verified with patient.     Was a Nurse, learning disability used for this call? No    Specialty Medication(s):   Inflammatory Disorders: Humira and General Specialty: Repatha     Current Outpatient Medications   Medication Sig Dispense Refill    ADALIMUMAB PEN CITRATE FREE 40 MG/0.4 ML Inject the contents of 1 pen (40 mg total) under the skin every fourteen (14) days. 6 each 3    adalimumab 80 mg/0.8 mL-40 mg/0.4 mL PnKt Inject the contents of 1 pen (80mg ) under the skin for one dose, THEN 1 pen (40mg ) every 14 days thereafter. 3 each 0    allopurinoL (ZYLOPRIM) 100 MG tablet Take 1 tablet (100 mg total) by mouth Every Tuesday, Thursday and Saturday. 36 tablet 3    amLODIPine (NORVASC) 10 MG tablet Take 1 tablet (10 mg total) by mouth daily. 90 tablet 0    apixaban (ELIQUIS) 5 mg Tab Take 1 tablet (5 mg total) by mouth in the morning. 30 tablet 0    aspirin 81 MG chewable tablet Chew 1 tablet (81 mg total) daily. 36 tablet 0    atorvastatin (LIPITOR) 80 MG tablet Take 1 tablet (80 mg total) by mouth daily. 30 tablet 0    beclomethasone dipropionate (QVAR REDIHALER) 80 mcg/actuation inhaler 1 puff every morning, 2 puffs every evening (Patient taking differently: continuous as needed. 1 puff every morning, 2 puffs every evening as needed) 10.6 g 0    evolocumab (REPATHA SURECLICK) 140 mg/mL PnIj Inject the contents of 1 pen (140 mg) under the skin every fourteen (14) days. 6 mL 3    fluocinolone 0.01 % external oil Apply topically daily as needed. Use for psoriasis and scalp scaling. 120 mL 5    furosemide (LASIX) 40 MG tablet Take 1 tablet (40 mg total) by mouth 3 (three) times a week. To be taken once a day if you develop leg swelling. (Patient not taking: Reported on 03/20/2022) 36 tablet 3    isosorbide mononitrate (IMDUR) 120 MG 24 hr tablet Take 1 tablet (120 mg total) by mouth daily. 30 tablet 0    sevelamer (RENVELA) 800 mg tablet Take 1 tablet (800 mg total) by mouth.      triamcinolone (KENALOG) 0.1 % ointment Apply topically Two (2) times a day. To psoriasis spots. Stop when skin is clear 454 g 2     No current facility-administered medications for this visit.        Changes to medications: Tresea reports no changes at this time.    Allergies   Allergen Reactions    Cephalexin Itching     unknown; tolerates cefepime    Mango Itching    Peach (Prunus Persica) Itching    Wellbutrin [Bupropion Hcl] Other (See Comments)     Seizure    Egg Itching    Fish Containing Products Itching    Lactase-Rennet Nausea And Vomiting    Lexiscan [Regadenoson] Nausea And Vomiting     Patient had nausea after the Lexi then vomitted [id number]    Mushroom Itching    Tomato (Solanum Lycopersicum) Itching       Changes to allergies: No    SPECIALTY MEDICATION ADHERENCE     Humira 40  mg/0.40mL : 11 days of medicine on hand   Repatha 140 mg/ml: 11 days  of medicine on hand       Medication Adherence    Patient reported X missed doses in the last month: 0  Specialty Medication: Humira 40mg /0.55mL  Patient is on additional specialty medications: Yes  Additional Specialty Medications: Repatha 140mg /mL  Patient Reported Additional Medication X Missed Doses in the Last Month: 0  Informant: patient          Specialty medication(s) dose(s) confirmed: Regimen is correct and unchanged.     Are there any concerns with adherence? No    Adherence counseling provided? Not needed    CLINICAL MANAGEMENT AND INTERVENTION      Clinical Benefit Assessment:    Do you feel the medicine is effective or helping your condition? Yes    Clinical Benefit counseling provided? Not needed    Adverse Effects Assessment:    Are you experiencing any side effects? No    Are you experiencing difficulty administering your medicine? No    Quality of Life Assessment:       How many days over the past month did your condition  keep you from your normal activities? For example, brushing your teeth or getting up in the morning. Patient declined to answer    Have you discussed this with your provider? Not needed    Acute Infection Status:    Acute infections noted within Epic:  No active infections  Patient reported infection: None    Therapy Appropriateness:    Is therapy appropriate and patient progressing towards therapeutic goals? Yes, therapy is appropriate and should be continued    DISEASE/MEDICATION-SPECIFIC INFORMATION      For patients on injectable medications: Patient currently has 0 Humira or Repatha doses left.  Next injection is scheduled for 06/01/22.    PATIENT SPECIFIC NEEDS     Does the patient have any physical, cognitive, or cultural barriers? No    Is the patient high risk? No    Does the patient require a Care Management Plan? No     SOCIAL DETERMINANTS OF HEALTH     At the [facility], we have learned that life circumstances - like trouble affording food, housing, utilities, or transportation can affect the health of many of our patients.   That is why we wanted to ask: are you currently experiencing any life circumstances that are negatively impacting your health and/or quality of life? Patient declined to answer    Social Determinants of Health     Financial Resource Strain: Not on file   Internet Connectivity: Not on file   Food Insecurity: Not on file   Tobacco Use: Medium Risk    Smoking Tobacco Use: Former    Smokeless Tobacco Use: Never    Passive Exposure: Not on file   Housing/Utilities: Unknown    Within the past 12 months, have you ever stayed: outside, in a car, in a tent, in an overnight shelter, or temporarily in someone else's home (i.e. couch-surfing)?: No    Are you worried about losing your housing?: Not on file    Within the past 12 months, have you been unable to get utilities (heat, electricity) when it was really needed?: Not on file   Alcohol Use: Not on file Transportation Needs: No Transportation Needs    Lack of Transportation (Medical): No    Lack of Transportation (Non-Medical): No   Substance Use: Not on file   Health Literacy: Low Risk     : Never   Physical Activity: Not on file  Interpersonal Safety: Not on file   Stress: Not on file   Intimate Partner Violence: Not on file   Depression: Not on file   Social Connections: Not on file       Would you be willing to receive help with any of the needs that you have identified today? Not applicable       SHIPPING     Specialty Medication(s) to be Shipped:   Inflammatory Disorders: Humira and General Specialty: Repatha    Other medication(s) to be shipped: No additional medications requested for fill at this time     Changes to insurance: No    Delivery Scheduled: Yes, Expected medication delivery date: 05/24/22.     Medication will be delivered via Same Day Courier to the confirmed prescription address in [facility].    The patient will receive a drug information handout for each medication shipped and additional FDA Medication Guides as required.  Verified that patient has previously received a Conservation officer, historic buildings and a Surveyor, mining.    The patient or caregiver noted above participated in the development of this care plan and knows that they can request review of or adjustments to the care plan at any time.      All of the patient's questions and concerns have been addressed.    Camillo Flaming   [facility] Specialty Pharmacist

## 2022-05-24 MED FILL — REPATHA SURECLICK 140 MG/ML SUBCUTANEOUS PEN INJECTOR: SUBCUTANEOUS | 84 days supply | Qty: 6 | Fill #1

## 2022-05-24 MED FILL — HUMIRA PEN CITRATE FREE 40 MG/0.4 ML: SUBCUTANEOUS | 28 days supply | Qty: 2 | Fill #4

## 2022-06-01 MED ORDER — APIXABAN 5 MG TABLET
ORAL_TABLET | Freq: Every day | ORAL | 0 refills | 30 days
Start: 2022-06-01 — End: ?

## 2022-06-01 MED ORDER — ASPIRIN 81 MG CHEWABLE TABLET
ORAL_TABLET | Freq: Every day | ORAL | 0 refills | 36 days
Start: 2022-06-01 — End: 2022-07-01

## 2022-06-01 MED ORDER — AMLODIPINE 10 MG TABLET
ORAL_TABLET | Freq: Every day | ORAL | 0 refills | 90 days
Start: 2022-06-01 — End: 2022-08-30

## 2022-06-02 MED ORDER — ALLOPURINOL 100 MG TABLET
ORAL_TABLET | ORAL | 3 refills | 84 days
Start: 2022-06-02 — End: 2023-06-02

## 2022-06-04 MED ORDER — ASPIRIN 81 MG CHEWABLE TABLET
ORAL_TABLET | Freq: Every day | ORAL | 1 refills | 30 days | Status: CP
Start: 2022-06-04 — End: 2022-08-03
  Filled 2022-06-08: qty 30, 30d supply, fill #0

## 2022-06-04 MED ORDER — AMLODIPINE 10 MG TABLET
ORAL_TABLET | Freq: Every day | ORAL | 0 refills | 90 days | Status: CP
Start: 2022-06-04 — End: 2022-09-02
  Filled 2022-06-08: qty 90, 90d supply, fill #0

## 2022-06-04 MED ORDER — APIXABAN 5 MG TABLET
ORAL_TABLET | Freq: Every day | ORAL | 0 refills | 30 days | Status: CP
Start: 2022-06-04 — End: ?

## 2022-06-06 DIAGNOSIS — I208 Other forms of angina pectoris: Principal | ICD-10-CM

## 2022-06-06 DIAGNOSIS — N2581 Secondary hyperparathyroidism of renal origin: Principal | ICD-10-CM

## 2022-06-06 DIAGNOSIS — M792 Neuralgia and neuritis, unspecified: Principal | ICD-10-CM

## 2022-06-06 MED ORDER — CINACALCET 90 MG TABLET
ORAL_TABLET | Freq: Every day | ORAL | 3 refills | 90 days | Status: CP
Start: 2022-06-06 — End: 2023-06-06

## 2022-06-20 ENCOUNTER — Ambulatory Visit: Admit: 2022-06-20 | Discharge: 2022-06-21 | Payer: MEDICARE

## 2022-06-20 DIAGNOSIS — I208 Other forms of angina pectoris: Principal | ICD-10-CM

## 2022-06-20 MED ADMIN — aminophylline injection 75 mg: 75 mg | INTRAVENOUS | @ 15:00:00 | Stop: 2022-06-20

## 2022-06-20 MED ADMIN — Tc-99m Sestamibi (Cardiolite): 15.3 | INTRAVENOUS | @ 15:00:00 | Stop: 2022-06-20

## 2022-06-20 MED ADMIN — Tc-99m Sestamibi (Cardiolite): 43.5 | INTRAVENOUS | @ 16:00:00 | Stop: 2022-06-20

## 2022-06-20 MED ADMIN — regadenoson (LEXISCAN) injection: .4 mg | INTRAVENOUS | @ 14:00:00 | Stop: 2022-06-20

## 2022-06-24 ENCOUNTER — Emergency Department: Admit: 2022-06-24 | Discharge: 2022-06-25 | Disposition: A | Discharge status: Home / Self Care | Payer: MEDICARE

## 2022-06-24 ENCOUNTER — Ambulatory Visit: Admit: 2022-06-24 | Discharge: 2022-06-25 | Disposition: A | Discharge status: Home / Self Care | Payer: MEDICARE

## 2022-06-24 DIAGNOSIS — R079 Chest pain, unspecified: Principal | ICD-10-CM

## 2022-06-24 LAB — CBC W/ AUTO DIFF
BASOPHILS ABSOLUTE COUNT: 0.1 10*9/L (ref 0.0–0.1)
BASOPHILS RELATIVE PERCENT: 0.9 %
EOSINOPHILS ABSOLUTE COUNT: 0.3 10*9/L (ref 0.0–0.5)
EOSINOPHILS RELATIVE PERCENT: 2.7 %
HEMATOCRIT: 37.2 % (ref 34.0–44.0)
HEMOGLOBIN: 12.3 g/dL (ref 11.3–14.9)
LYMPHOCYTES ABSOLUTE COUNT: 3.3 10*9/L (ref 1.1–3.6)
LYMPHOCYTES RELATIVE PERCENT: 29.2 %
MEAN CORPUSCULAR HEMOGLOBIN CONC: 32.9 g/dL (ref 32.0–36.0)
MEAN CORPUSCULAR HEMOGLOBIN: 29.6 pg (ref 25.9–32.4)
MEAN CORPUSCULAR VOLUME: 89.9 fL (ref 77.6–95.7)
MEAN PLATELET VOLUME: 7.4 fL (ref 6.8–10.7)
MONOCYTES ABSOLUTE COUNT: 0.8 10*9/L (ref 0.3–0.8)
MONOCYTES RELATIVE PERCENT: 6.8 %
NEUTROPHILS ABSOLUTE COUNT: 6.7 10*9/L (ref 1.8–7.8)
NEUTROPHILS RELATIVE PERCENT: 60.4 %
NUCLEATED RED BLOOD CELLS: 0 /100{WBCs} (ref <=4)
PLATELET COUNT: 310 10*9/L (ref 150–450)
RED BLOOD CELL COUNT: 4.14 10*12/L (ref 3.95–5.13)
RED CELL DISTRIBUTION WIDTH: 13.3 % (ref 12.2–15.2)
WBC ADJUSTED: 11.1 10*9/L (ref 3.6–11.2)

## 2022-06-24 LAB — MAGNESIUM: MAGNESIUM: 1.9 mg/dL (ref 1.6–2.6)

## 2022-06-24 LAB — COMPREHENSIVE METABOLIC PANEL
ALBUMIN: 3 g/dL — ABNORMAL LOW (ref 3.4–5.0)
ALKALINE PHOSPHATASE: 154 U/L — ABNORMAL HIGH (ref 46–116)
ALT (SGPT): 10 U/L (ref 10–49)
ANION GAP: 11 mmol/L (ref 5–14)
AST (SGOT): 11 U/L (ref <=34)
BILIRUBIN TOTAL: 0.2 mg/dL — ABNORMAL LOW (ref 0.3–1.2)
BLOOD UREA NITROGEN: 41 mg/dL — ABNORMAL HIGH (ref 9–23)
BUN / CREAT RATIO: 8
CALCIUM: 8.7 mg/dL (ref 8.7–10.4)
CHLORIDE: 110 mmol/L — ABNORMAL HIGH (ref 98–107)
CO2: 23.3 mmol/L (ref 20.0–31.0)
CREATININE: 5.44 mg/dL — ABNORMAL HIGH
EGFR CKD-EPI (2021) FEMALE: 9 mL/min/{1.73_m2} — ABNORMAL LOW (ref >=60)
GLUCOSE RANDOM: 107 mg/dL (ref 70–179)
POTASSIUM: 3.9 mmol/L (ref 3.4–4.8)
PROTEIN TOTAL: 6.7 g/dL (ref 5.7–8.2)
SODIUM: 144 mmol/L (ref 135–145)

## 2022-06-24 LAB — PROTIME-INR
INR: 0.97
PROTIME: 10.9 s (ref 9.8–12.8)

## 2022-06-24 LAB — HIGH SENSITIVITY TROPONIN I - SERIAL: HIGH SENSITIVITY TROPONIN I: <3 ng/L (ref <=34)

## 2022-06-24 LAB — HIGH SENSITIVITY TROPONIN I - 2 HOUR SERIAL
HIGH SENSITIVITY TROPONIN - DELTA (0-2H): 0 ng/L (ref <=7)
HIGH-SENSITIVITY TROPONIN I - 2 HOUR: <3 ng/L (ref <=34)

## 2022-06-24 LAB — LIPASE: LIPASE: 40 U/L (ref 12–53)

## 2022-06-24 MED ADMIN — aspirin chewable tablet 324 mg: 324 mg | ORAL | Stop: 2022-06-24

## 2022-06-25 MED ADMIN — iohexoL (OMNIPAQUE) 350 mg iodine/mL solution 50 mL: 50 mL | INTRAVENOUS | @ 03:00:00 | Stop: 2022-06-24

## 2022-06-25 NOTE — Unmapped (Signed)
[facility]  Emergency Department Provider Note     ED Clinical Impression     Final diagnoses:   Chest pain, unspecified type (Primary)      Impression, Medical Decision Making, ED Course     Impression: 51 y.o. female with PMH most significant for ESRD on PD, Psoriasis (on Humira), and stable angina who presents with 4 days of central chest pain (severity 10/10) that started after her stress test on 06/20/22 as described below.    On exam, patient is chronically ill-appearing, splinting on deep inspiration, tenderness to palpation of the left chest wall without obvious deformity.    Differential diagnosis includes ACS, aortic dissection, myocarditis/pericarditis, PE, pneumothorax, Boerhaave syndrome, viral illness, MSK, among other etiology.  Aortic dissection unlikely given normal chest x-ray without aortic widening, no pulse deficits, no new murmurs.  Myocarditis/pericarditis unlikely given patient's normal vital signs, afebrile, normal cardiac exam. Pneumothorax unlikely given patient's description of chest pain and normal lung exam.  Boerhaave syndrome unlikely given lack of retching, nor physical exam, description of chest pain. Given patients new pleuritic chest pain, splinting on exam will obtain CTA for evaluation of possible PE. Shared decision making discussion regarding contrast with kidney history.     ACS possible given patient's history and description of chest pain.  We will plan for 2 troponins and an EKG.  Will trend EKGs as necessary.    ED Course as of 06/25/22 1116   Sun Jun 24, 2022   2045 Influenza/ RSV/COVID PCR:    SARS-CoV-2 PCR Negative   Influenza A Negative   Influenza B Negative   RSV Negative  negative   2250 hsTroponin I: <3  EKG sinus tach, similar to prior, no ST elevation.    2250 delta hsTroponin I: 0   2255 CTA Chest W Contrast  IMPRESSION:     No evidence of pulmonary embolism.     2256 Stable for discharge.  Although unclear etiology there is no evidence for further evaluation. Patient reports feeling better, and feels relieved that the work up has been negative. Discussed discharge instructions and return precautions fully with patient expressed understanding.     ____________________________________________    The case was discussed with the attending physician, who is in agreement with the above assessment and plan.      History     Chief Complaint  Chief Complaint   Patient presents with    Chest Pain       HPI   Megan Robertson is a 51 y.o. female with past medical history as below who presents with chest pain. Family at bedside reports 4 days of central chest pain (severity 10/10) that started after her stress test on 06/20/22. Further endorses shortness of breath as well as NB diarrhea (2 episodes per day) and NB emesis (2 episodes per day) that also started after her stress test. Of note, husband at bedside reports that patient had an allergic reaction to the IV solution given during the stress test. Patient was given antibiotic. We do not have a record of this. PD catheter has been in place for a while. Denies history of peritonitis, SBP, or other complications. Further denies abdominal pain, leg pain, leg swelling, dysuria, or hematuria.     Outside Historian(s): I have obtained additional history/collateral from Husband at bedside.    External Records Reviewed: 06/20/22 stress test:   Stress Findings - A pharmacological stress test was performed using regadenoson 0.4mg  IV The patient with a peak HR  of 120 bpm (71 % of MPHR)   - The patient reported dyspnea, nausea and headaches during the stress test. Symptoms ended during recovery.     Resting ECG - Baseline shows normal sinus rhythm   - Non-specific ST T wave changes noted.     Stress ECG - Minor non-specific ST and T wave changes noted during stress or recovery   - There were no arrhythmias during stress.   - There were no arrhythmias during recovery. The ECG was not diagnostic due to resting ST-T abnormalities. Non-diagnostic ECG portion of pharmacologic stress test.       Past Medical History:   Diagnosis Date    Abnormal mammogram     Abnormal Pap smear of cervix     Allergic     Anemia     Anxiety     Arthritis     Asthma     Back pain     Cancer (CMS-HCC) Ovarian    Depression     Diabetes mellitus (CMS-HCC) Borderline    Eczema     ESRD (end stage renal disease) on dialysis (CMS-HCC)     FSGS (focal segmental glomerulosclerosis) 1997    renal biopsy at [facility]    GERD (gastroesophageal reflux disease)     Gout     H/O adenoidectomy     had adenoids removed    Hypercholesteremia     Hypertension     Keloid     Ovarian cancer (CMS-HCC)     Pancreatitis     Psoriasis     Seizure (CMS-HCC)     unknown etiology; none for several years    Stroke (CMS-HCC) 2000       Past Surgical History:   Procedure Laterality Date    APPENDECTOMY      BREAST EXCISIONAL BIOPSY Right 11/28/2016    CHEMOTHERAPY      for ovarian CA in 2000    CHG US GUIDE, VASCULAR ACCESS N/A 01/29/2020    Procedure: ULTRASOUND GUIDANCE FOR VASC ACCESS REQUIRING [place] EVAL OF POTENTIAL ACCESS SITES;  Surgeon: Leona Carry, MD;  Location: MA[facility];  Service: Transplant    CHG US GUIDE, VASCULAR ACCESS N/A 06/08/2020    Procedure: ULTRASOUND GUIDANCE FOR VASC ACCESS REQUIRING [place] EVAL OF POTENTIAL ACCESS SITES;  Surgeon: Leona Carry, MD;  Location: MAIN OR [facility];  Service: Transplant    CHOLECYSTECTOMY      COMBINED HYSTEROSCOPY DIAGNOSTIC / D&C  07/07/2015    Planned for endometrial ablation, but patient had uterine perforation after D&C and unable to perform    HYSTERECTOMY      LEFT OOPHORECTOMY Left 04/12/2000    Removed with L fallopian tube for ectopic pregnancy    OOPHORECTOMY      PR COLONOSCOPY W/BIOPSY SINGLE/MULTIPLE  09/12/2021    Procedure: COLONOSCOPY, FLEXIBLE, PROXIMAL TO SPLENIC FLEXURE; WITH BIOPSY, SINGLE OR MULTIPLE;  Surgeon: Maris Berger, MD;  Location: [facility];  Service: Gastroenterology    PR COLSC FLX W/RMVL OF TUMOR POLYP LESION SNARE TQ N/A 09/12/2021    Procedure: COLONOSCOPY FLEX; W/REMOV TUMOR/LES BY SNARE;  Surgeon: Maris Berger, MD;  [facility]: [facility];  Service: Gastroenterology    PR CREAT AV FISTULA,AUTOGENOUS GRAFT Left 01/29/2020    Procedure: Create Av Fistula (Separt Proc); Autog Gft;  Surgeon: Leona Carry, MD;  Location: MAIN OR [facility];  Service: Transplant    PR CREAT AV FISTULA,NON-AUTOGENOUS GRAFT Left 06/08/2020  Procedure: CREATE AV FISTULA (SEPARATE PROC); NONAUTOGENOUS GRAFT (EG, BIOLOGICAL COLLAGEN, THERMOPLASTIC GRAFT);  Surgeon: Leona Carry, MD;  Location: MAIN OR [facility];  Service: Transplant    PR EXPLORATION N/FLWD SURG UPPER EXTREMITY ARTERY Left 08/02/2020    Procedure: Exploration Not Followed By Surgical Repair, Artery; Upper Extremity (Eg, Axillary, Brachial, Radial, Ulnar);  Surgeon: Earney Mallet, MD;  Location: MAIN OR [facility];  Service: Vascular    PR INCIS/DRAIN ARM,DEEP ABSC/HEMATOMA Left 08/14/2020    Procedure: INCISION AND DRAINAGE, UPPER ARM OR ELBOW AREA; DEEP ABSCESS OR HEMATOMA;  Surgeon: Earney Mallet, MD;  Location: MAIN OR [place];  Service: Vascular    PR INSERTION TUNNEL INTRAPERITONEAL CATH DIAL OPEN Midline 02/01/2021    Procedure: INSERTION OF INTRAPERITONEAL CANNULA OR CATHETER FOR DRAINAGE OR DIALYSIS; PERMANENT;  Surgeon: Loney Hering, MD;  Location: MAIN OR [place];  Service: Transplant    PR REMV ART CLOT AXILL-BRACH,ARM INCIS Left 08/02/2020    Procedure: Embolect/Thrombec; Axilry/Brachial Art-Arm Incs;  Surgeon: Earney Mallet, MD;  Location: MAIN OR [facility];  Service: Vascular    PR UPPER GI ENDOSCOPY,BIOPSY N/A 08/29/2017    Procedure: UGI ENDOSCOPY; WITH BIOPSY, SINGLE OR MULTIPLE;  Surgeon: Neysa Hotter, MD;  Location: GI PROCEDURES [facility];  Service: Gastroenterology    PR VEIN BYPASS GRAFT,SUBCL-BRACHIAL Left 08/05/2020    Procedure: Bypass Graft, With Vein; Subclavian-Brachial;  Surgeon: Earney Mallet, MD;  Location: MAIN OR [facility];  Service: Vascular    SALPINGECTOMY Left 04/12/2000    Ectopic pregnancy    SALPINGECTOMY Right 11/22/2015    at time of total vaginal hysterectomy    TONSILECTOMY, ADENOIDECTOMY, BILATERAL MYRINGOTOMY AND TUBES      TOTAL VAGINAL HYSTERECTOMY  11/22/2015    With R salpingectomy       No current facility-administered medications for this encounter.    Current Outpatient Medications:     ADALIMUMAB PEN CITRATE FREE 40 MG/0.4 ML, Inject the contents of 1 pen (40 mg total) under the skin every fourteen (14) days., Disp: 6 each, Rfl: 3    adalimumab 80 mg/0.8 mL-40 mg/0.4 mL PnKt, Inject the contents of 1 pen (80mg ) under the skin for one dose, THEN 1 pen (40mg ) every 14 days thereafter., Disp: 3 each, Rfl: 0    allopurinoL (ZYLOPRIM) 100 MG tablet, Take 1 tablet (100 mg total) by mouth Every Tuesday, Thursday and Saturday., Disp: 36 tablet, Rfl: 3    amLODIPine (NORVASC) 10 MG tablet, Take 1 tablet (10 mg total) by mouth daily., Disp: 90 tablet, Rfl: 0    apixaban (ELIQUIS) 5 mg Tab, Take 1 tablet (5 mg total) by mouth in the morning., Disp: 30 tablet, Rfl: 0    aspirin 81 MG chewable tablet, Chew 1 tablet (81 mg total) daily., Disp: 30 tablet, Rfl: 1    atorvastatin (LIPITOR) 80 MG tablet, Take 1 tablet (80 mg total) by mouth daily., Disp: 30 tablet, Rfl: 0    beclomethasone dipropionate (QVAR REDIHALER) 80 mcg/actuation inhaler, 1 puff every morning, 2 puffs every evening (Patient taking differently: continuous as needed. 1 puff every morning, 2 puffs every evening as needed), Disp: 10.6 g, Rfl: 0    cinacalcet (SENSIPAR) 90 MG tablet, Take 1 tablet (90 mg total) by mouth daily., Disp: 90 tablet, Rfl: 3    evolocumab (REPATHA SURECLICK) 140 mg/mL PnIj, Inject the contents of 1 pen (140 mg) under the skin every fourteen (14) days., Disp: 6 mL, Rfl: 3    fluocinolone 0.01 % external oil,  Apply topically daily as needed. Use for psoriasis and scalp scaling., Disp: 120 mL, Rfl: 5    furosemide (LASIX) 40 MG tablet, Take 1 tablet (40 mg total) by mouth 3 (three) times a week. To be taken once a day if you develop leg swelling. (Patient not taking: Reported on 03/20/2022), Disp: 36 tablet, Rfl: 3    isosorbide mononitrate (IMDUR) 120 MG 24 hr tablet, Take 1 tablet (120 mg total) by mouth daily., Disp: 30 tablet, Rfl: 0    sevelamer (RENVELA) 800 mg tablet, Take 1 tablet (800 mg total) by mouth., Disp: , Rfl:     triamcinolone (KENALOG) 0.1 % ointment, Apply topically Two (2) times a day. To psoriasis spots. Stop when skin is clear, Disp: 454 g, Rfl: 2    Allergies  Cephalexin, Mango, Peach (prunus persica), Wellbutrin [bupropion hcl], Egg, Fish containing products, Lactase-rennet, Lexiscan [regadenoson], Mushroom, and Tomato (solanum lycopersicum)    Family History  Family History   Adopted: Yes   Problem Relation Age of Onset    Heart attack Mother 77    Alcohol abuse Mother     Mental illness Mother     No Known Problems Father     No Known Problems Sister     No Known Problems Daughter     No Known Problems Maternal Grandmother     No Known Problems Maternal Grandfather     No Known Problems Paternal Grandmother     No Known Problems Paternal Grandfather     No Known Problems Brother     No Known Problems Son     No Known Problems Maternal Aunt     No Known Problems Maternal Uncle     No Known Problems Paternal Aunt     No Known Problems Paternal Uncle     No Known Problems Other     Anesthesia problems Neg Hx     Broken bones Neg Hx     Cancer Neg Hx     Clotting disorder Neg Hx     Collagen disease Neg Hx     Diabetes Neg Hx     Dislocations Neg Hx     Fibromyalgia Neg Hx     Gout Neg Hx     Hemophilia Neg Hx     Osteoporosis Neg Hx     Rheumatologic disease Neg Hx     Scoliosis Neg Hx     Severe sprains Neg Hx     Sickle cell anemia Neg Hx     Spinal Compression Fracture Neg Hx     Melanoma Neg Hx     Basal cell carcinoma Neg Hx     Squamous cell carcinoma Neg Hx     Breast cancer Neg Hx        Social History  Social History     Tobacco Use    Smoking status: Former     Packs/day: 2.00     Years: 1.00     Additional pack years: 0.00     Total pack years: 2.00     Types: Cigarettes     Quit date: 06/30/2010     Years since quitting: 11.9    Smokeless tobacco: Never   Vaping Use    Vaping Use: Never used   Substance Use Topics    Alcohol use: No    Drug use: No        Physical Exam     VITAL SIGNS:      Vitals:  06/24/22 1855 06/24/22 2053 06/24/22 2218 06/24/22 2314   BP: 153/76   159/81   Pulse: 112 95 91 90   Resp: 16 26 25 17    Temp: 37 ??C (98.6 ??F)      SpO2: 100% 99% 97% 99%     Constitutional: Alert and oriented. No acute distress.  Chronically ill-appearing.  Eyes: Conjunctivae are normal.  HEENT: Normocephalic and atraumatic. Conjunctivae clear. No congestion. Moist mucous membranes.   Cardiovascular: Rate as above, regular rhythm. Normal and symmetric distal pulses. Brisk capillary refill. Normal skin turgor.  Small tender to palpation midline/midsternal/left without obvious deformity.  Respiratory: Normal respiratory effort. Breath sounds are normal. There are no wheezing or crackles heard.  Splinting on deep inspiration.  Gastrointestinal: Soft, non-distended, non-tender.  Genitourinary: Deferred.  Musculoskeletal: Non-tender with normal range of motion in all extremities.  Neurologic: Normal speech and language. No gross focal neurologic deficits are appreciated. Patient is moving all extremities equally, face is symmetric at rest and with speech.  Skin: Skin is warm, dry and intact. No rash noted.  Psychiatric: Mood and affect are normal. Speech and behavior are normal.     Radiology     CTA Chest W Contrast   Final Result      No evidence of pulmonary embolism.      XR Chest 2 views   Final Result      No acute cardiopulmonary abnormality.          Pertinent labs & imaging results that were available during my care of the patient were independently interpreted by me and considered in my medical decision making (see chart for details).    Portions of this record have been created using Scientist, clinical (histocompatibility and immunogenetics). Dictation errors have been sought, but may not have been identified and corrected.    Documentation assistance was provided by Michaelene Song, on June 24, 2022 at 19:07 for Cheryle Horsfall, DO.     Documentation assistance provided by the above mentioned scribe. I was present during the time the encounter was recorded. The information recorded by the scribe was done at my direction and has been reviewed and validated by me.            Cheryle Horsfall, MD  Resident  06/25/22 [phone]

## 2022-06-25 NOTE — Unmapped (Addendum)
Pt present here with chest pain, diarrhea and sob since she had a stress test done on 08/30. States the medicine they use to have stress test made her sick. H/o MI. Pt does PD every night.

## 2022-07-11 DIAGNOSIS — E041 Nontoxic single thyroid nodule: Principal | ICD-10-CM

## 2022-07-13 MED ORDER — CALCITRIOL 0.5 MCG CAPSULE
ORAL_CAPSULE | Freq: Every day | ORAL | 3 refills | 90 days | Status: CP
Start: 2022-07-13 — End: 2023-07-13

## 2022-08-06 NOTE — Unmapped (Signed)
[facility] Refill Coordination Note    Specialty Medication(s) to be Shipped:   Inflammatory Disorders: Humira and General Specialty: Repatha    Other medication(s) to be shipped: No additional medications requested for fill at this time     Megan Robertson, DOB: 09-07-1971  Phone: [phone] (home)       All above HIPAA information was verified with patient.     Was a Nurse, learning disability used for this call? No    Completed refill call assessment today to schedule patient's medication shipment from the [facility] [phone]).  All relevant notes have been reviewed.     Specialty medication(s) and dose(s) confirmed: Regimen is correct and unchanged.   Changes to medications: Megan Robertson reports no changes at this time.  Changes to insurance: No  New side effects reported not previously addressed with a pharmacist or physician: None reported  Questions for the pharmacist: No    Confirmed patient received a Conservation officer, historic buildings and a Surveyor, mining with first shipment. The patient will receive a drug information handout for each medication shipped and additional FDA Medication Guides as required.       DISEASE/MEDICATION-SPECIFIC INFORMATION        For patients on injectable medications: Patient currently has 1 doses left.  Next injection is scheduled for 08/08/22.    SPECIALTY MEDICATION ADHERENCE     Medication Adherence    Patient reported X missed doses in the last month: 0  Specialty Medication: Repatha 140mg /ml  Patient is on additional specialty medications: Yes  Additional Specialty Medications: Humira (CF) 40mg /0.90ml  Patient is on more than two specialty medications: No                          Were doses missed due to medication being on hold? No    Repatha 140 mg/ml: 2 days of medicine on hand   Megan Robertson (CF) 40mg / 0.36ml: 2 days of medicine on hand         REFERRAL TO PHARMACIST     Referral to the pharmacist: Not needed      [facility]     Shipping address confirmed in Epic.     Delivery Scheduled: Yes, Expected medication delivery date: 08/14/22.     Medication will be delivered via Same Day Courier to the prescription address in Epic WAM.    Megan Robertson [facility] Specialty Technician

## 2022-08-14 MED FILL — HUMIRA PEN CITRATE FREE 40 MG/0.4 ML: SUBCUTANEOUS | 28 days supply | Qty: 2 | Fill #5

## 2022-08-14 MED FILL — REPATHA SURECLICK 140 MG/ML SUBCUTANEOUS PEN INJECTOR: SUBCUTANEOUS | 84 days supply | Qty: 6 | Fill #2

## 2022-08-22 ENCOUNTER — Emergency Department
Admit: 2022-08-22 | Discharge: 2022-08-23 | Disposition: A | Discharge status: Home / Self Care | Payer: MEDICARE | Attending: Student in an Organized Health Care Education/Training Program

## 2022-08-22 ENCOUNTER — Ambulatory Visit
Admit: 2022-08-22 | Discharge: 2022-08-23 | Disposition: A | Discharge status: Home / Self Care | Payer: MEDICARE | Attending: Student in an Organized Health Care Education/Training Program

## 2022-08-22 DIAGNOSIS — U071 COVID-19: Principal | ICD-10-CM

## 2022-08-22 LAB — COMPREHENSIVE METABOLIC PANEL
ALBUMIN: 3.3 g/dL — ABNORMAL LOW (ref 3.4–5.0)
ALKALINE PHOSPHATASE: 137 U/L — ABNORMAL HIGH (ref 46–116)
ALT (SGPT): 16 U/L (ref 10–49)
ANION GAP: 12 mmol/L (ref 5–14)
AST (SGOT): 29 U/L (ref <=34)
BILIRUBIN TOTAL: 0.3 mg/dL (ref 0.3–1.2)
BLOOD UREA NITROGEN: 45 mg/dL — ABNORMAL HIGH (ref 9–23)
BUN / CREAT RATIO: 5
CALCIUM: 8.8 mg/dL (ref 8.7–10.4)
CHLORIDE: 107 mmol/L (ref 98–107)
CO2: 21.7 mmol/L (ref 20.0–31.0)
CREATININE: 9.12 mg/dL — ABNORMAL HIGH
EGFR CKD-EPI (2021) FEMALE: 5 mL/min/{1.73_m2} — ABNORMAL LOW (ref >=60)
GLUCOSE RANDOM: 102 mg/dL (ref 70–179)
POTASSIUM: 3.7 mmol/L (ref 3.4–4.8)
PROTEIN TOTAL: 7.9 g/dL (ref 5.7–8.2)
SODIUM: 141 mmol/L (ref 135–145)

## 2022-08-22 LAB — CBC W/ AUTO DIFF
BASOPHILS ABSOLUTE COUNT: 0.1 10*9/L (ref 0.0–0.1)
BASOPHILS RELATIVE PERCENT: 0.8 %
EOSINOPHILS ABSOLUTE COUNT: 0 10*9/L (ref 0.0–0.5)
EOSINOPHILS RELATIVE PERCENT: 0.1 %
HEMATOCRIT: 44.4 % — ABNORMAL HIGH (ref 34.0–44.0)
HEMOGLOBIN: 14.8 g/dL (ref 11.3–14.9)
LYMPHOCYTES ABSOLUTE COUNT: 1.9 10*9/L (ref 1.1–3.6)
LYMPHOCYTES RELATIVE PERCENT: 27.7 %
MEAN CORPUSCULAR HEMOGLOBIN CONC: 33.3 g/dL (ref 32.0–36.0)
MEAN CORPUSCULAR HEMOGLOBIN: 29.8 pg (ref 25.9–32.4)
MEAN CORPUSCULAR VOLUME: 89.7 fL (ref 77.6–95.7)
MEAN PLATELET VOLUME: 7.8 fL (ref 6.8–10.7)
MONOCYTES ABSOLUTE COUNT: 0.6 10*9/L (ref 0.3–0.8)
MONOCYTES RELATIVE PERCENT: 8.1 %
NEUTROPHILS ABSOLUTE COUNT: 4.4 10*9/L (ref 1.8–7.8)
NEUTROPHILS RELATIVE PERCENT: 63.3 %
NUCLEATED RED BLOOD CELLS: 0 /100{WBCs} (ref <=4)
PLATELET COUNT: 264 10*9/L (ref 150–450)
RED BLOOD CELL COUNT: 4.95 10*12/L (ref 3.95–5.13)
RED CELL DISTRIBUTION WIDTH: 13.6 % (ref 12.2–15.2)
WBC ADJUSTED: 7 10*9/L (ref 3.6–11.2)

## 2022-08-22 LAB — LACTATE SEPSIS, VENOUS: LACTATE BLOOD VENOUS: 2 mmol/L — ABNORMAL HIGH (ref 0.5–1.8)

## 2022-08-22 MED ORDER — ONDANSETRON HCL 4 MG TABLET
ORAL_TABLET | Freq: Four times a day (QID) | ORAL | 0 refills | 3 days | Status: CP
Start: 2022-08-22 — End: 2022-08-25

## 2022-08-22 MED ADMIN — ondansetron (ZOFRAN) injection 4 mg: 4 mg | INTRAVENOUS | Stop: 2022-08-22

## 2022-08-22 MED ADMIN — lactated ringers bolus 500 mL: 500 mL | INTRAVENOUS | Stop: 2022-08-22

## 2022-08-22 MED ADMIN — morphine 4 mg/mL injection 4 mg: 4 mg | INTRAVENOUS | Stop: 2022-08-22

## 2022-08-22 MED ADMIN — acetaminophen (TYLENOL) tablet 650 mg: 650 mg | ORAL | Stop: 2022-08-22

## 2022-08-23 NOTE — Unmapped (Signed)
[facility]  Emergency Department Provider Note       ED Clinical Impression     Final diagnoses:   COVID-19 (Primary)        Impression, Medical Decision Making, ED Course     Impression: This is a 51 y.o. female with a history of ovarian cancer, stroke, ESRD on PD, HTN, hyperlipidemia, GERD, gout, and asthma who presents with fatigue. Upon initial evaluation in the emergency department, the patient was non-toxic appearing with vitals notable for fever to 100.12F, mild hypotension to 97/58, and mild tachycardia to 105 as below.    BP 100/76  - Pulse 87  - Temp 36.7 ??C (98 ??F) (Oral)  - Resp 22  - Wt 85.7 kg (189 lb)  - LMP  (LMP Unknown)  - SpO2 96%  - BMI 34.57 kg/m??     Diagnostic workup as below.  Patient presenting with a 6-day history of fatigue associated with URI symptoms and nausea/vomiting.  Husband with similar symptoms.  Uncomfortable but no acute distress.  Hemodynamically stable.  My differential includes viral infection, pneumonia, bacteremia, among other things. We will reassess after IV fluids, Tylenol, morphine, and Zofran.  We will hold off on sepsis antibiotics as suspicion for viral etiology is high.    No electrolyte disturbances.  No leukocytosis or anemia.  Mildly elevated lactate.  COVID-positive.  Personally viewed chest x-ray which shows no obvious superimposed lobar pneumonia.  On reevaluation, patient with symptom improvement after multimodal pain regimen.  Patient ambulatory without hypoxia or shortness of breath.  We are recommending outpatient symptomatic management with anti-inflammatories and Zofran for nausea as needed.  Considered Paxlovid but patient is unable to take this medication due to multiple interactions including her home Eliquis.  Patient unvaccinated for COVID or flu and we had a discussion about flu vaccination once her current symptoms resolved.  Patient and husband verbalized understanding strict return precautions.    Orders Placed This Encounter   Procedures RAPID Influenza /RSV/COVID PCR    Blood Culture    Blood Culture    XR Chest Portable    CBC w/ Differential    Comprehensive Metabolic Panel    Lactate Sepsis, Venous    Misc nursing order (Sepsis Timer)    Cardiac Monitor    Oxygen sat continuous monitoring    Notify Provider    In and Out (I & O) cath    Notify Provider    Obtain medical records    Vital signs    RN to notify pharmacy immediately that an antibiotic has been ordered from the Sepsis Order Set (if not available in the Py[employer]is/[employer] or if not yet verified)    Insert peripheral IV            ____________________________________________    The case was discussed with Dr. Clovis Fredrickson, MD, who is in agreement with the above assessment and plan.    Dictation software was used while making this note. Please excuse any errors made with dictation software.     Additional Medical Decision Making     I have reviewed the vital signs and the nursing notes. Labs and radiology results that were available during my care of the patient were independently reviewed by me and considered in my medical decision making.     I independently visualized the EKG tracing if performed.  I independently visualized the radiology images if performed.  I reviewed the patient's prior medical records if available.  Additional history obtained from  family if available.  I discussed the case with the admitting provider and the consulting services if the patient was admitted and/or consulting services were utilized.     History     Chief Complaint  Chief Complaint   Patient presents with    Fatigue       HPI   Megan Robertson is a 51 y.o. female with a history as above who is presenting with fatigue. The patient reports 5 days of fevers (Tmax 103F), cough, shortness of breath, NBNB emesis, generalized body aches, and fatigue with associated decreased PO intake. She took Tylenol with minimal symptomatic relief. Patient's husband at bedside notes onset of similar symptoms a couple days after the patient's initial onset of symptoms. She denies abdominal pain, dysuria, or diarrhea.      All other systems have been reviewed and are negative except as otherwise documented.    Past Medical History:   Diagnosis Date    Abnormal mammogram     Abnormal Pap smear of cervix     Allergic     Anemia     Anxiety     Arthritis     Asthma     Back pain     Cancer (CMS-HCC) Ovarian    Depression     Diabetes mellitus (CMS-HCC) Borderline    Eczema     ESRD (end stage renal disease) on dialysis (CMS-HCC)     FSGS (focal segmental glomerulosclerosis) 1997    renal biopsy at [facility]    GERD (gastroesophageal reflux disease)     Gout     H/O adenoidectomy     had adenoids removed    Hypercholesteremia     Hypertension     Keloid     Ovarian cancer (CMS-HCC)     Pancreatitis     Psoriasis     Seizure (CMS-HCC)     unknown etiology; none for several years    Stroke (CMS-HCC) 2000       Past Surgical History:   Procedure Laterality Date    APPENDECTOMY      BREAST EXCISIONAL BIOPSY Right 11/28/2016    CHEMOTHERAPY      for ovarian CA in 2000    CHG US GUIDE, VASCULAR ACCESS N/A 01/29/2020    Procedure: ULTRASOUND GUIDANCE FOR VASC ACCESS REQUIRING [place] EVAL OF POTENTIAL ACCESS SITES;  Surgeon: Leona Carry, MD;  Location: MAIN OR [facility];  Service: Transplant    CHG US GUIDE, VASCULAR ACCESS N/A 06/08/2020    Procedure: ULTRASOUND GUIDANCE FOR VASC ACCESS REQUIRING [place] EVAL OF POTENTIAL ACCESS SITES;  Surgeon: Leona Carry, MD;  Location: MAIN OR [facility];  Service: Transplant    CHOLECYSTECTOMY      COMBINED HYSTEROSCOPY DIAGNOSTIC / D&C  07/07/2015    Planned for endometrial ablation, but patient had uterine perforation after D&C and unable to perform    HYSTERECTOMY      LEFT OOPHORECTOMY Left 04/12/2000    Removed with L fallopian tube for ectopic pregnancy    OOPHORECTOMY      PR COLONOSCOPY W/BIOPSY SINGLE/MULTIPLE  09/12/2021    Procedure: COLONOSCOPY, FLEXIBLE, PROXIMAL TO SPLENIC FLEXURE; WITH BIOPSY, SINGLE OR MULTIPLE;  Surgeon: Maris Berger, MD;  Location: [facility]URES [facility];  Service: Gastroenterology    PR COLSC FLX W/RMVL OF TUMOR POLYP LESION SNARE TQ N/A 09/12/2021    Procedure: COLONOSCOPY FLEX; W/REMOV TUMOR/LES BY SNARE;  Surgeon: Maris Berger, MD;  Location: [facility];  Service:  Gastroenterology    PR CREAT AV FISTULA,AUTOGENOUS GRAFT Left 01/29/2020    Procedure: Create Av Fistula (Separt Proc); Autog Gft;  Surgeon: Leona Carry, MD;  Location: MAIN OR [facility];  Service: Transplant    PR CREAT AV FISTULA,NON-AUTOGENOUS GRAFT Left 06/08/2020    Procedure: CREATE AV FISTULA (SEPARATE PROC); NONAUTOGENOUS GRAFT (EG, BIOLOGICAL COLLAGEN, THERMOPLASTIC GRAFT);  Surgeon: Leona Carry, MD;  Location: MAIN OR [facility];  Service: Transplant    PR EXPLORATION N/FLWD SURG UPPER EXTREMITY ARTERY Left 08/02/2020    Procedure: Exploration Not Followed By Surgical Repair, Artery; Upper Extremity (Eg, Axillary, Brachial, Radial, Ulnar);  Surgeon: Earney Mallet, MD;  Location: MAIN OR [facility];  Service: Vascular    PR INCIS/DRAIN ARM,DEEP ABSC/HEMATOMA Left 08/14/2020    Procedure: INCISION AND DRAINAGE, UPPER ARM OR ELBOW AREA; DEEP ABSCESS OR HEMATOMA;  Surgeon: Earney Mallet, MD;  Location: MAIN OR [place];  Service: Vascular    PR INSERTION TUNNEL INTRAPERITONEAL CATH DIAL OPEN Midline 02/01/2021    Procedure: INSERTION OF INTRAPERITONEAL CANNULA OR CATHETER FOR DRAINAGE OR DIALYSIS; PERMANENT;  Surgeon: Loney Hering, MD;  Location: MAIN OR [place];  Service: Transplant    PR REMV ART CLOT AXILL-BRACH,ARM INCIS Left 08/02/2020    Procedure: Embolect/Thrombec; Axilry/Brachial Art-Arm Incs;  Surgeon: Earney Mallet, MD;  Location: MAIN OR [facility];  Service: Vascular    PR UPPER GI ENDOSCOPY,BIOPSY N/A 08/29/2017    Procedure: UGI ENDOSCOPY; WITH BIOPSY, SINGLE OR MULTIPLE;  Surgeon: Neysa Hotter, MD;  Location: [facility] PROCED[facility] [place] [facility];  Service: Gastroenterology    PR VEIN BYPASS GRAFT,SUBCL-BRACHIAL Left 08/05/2020    Procedure: Bypass Graft, With Vein; Subclavian-Brachial;  Surgeon: Earney Mallet, MD;  Location: MAIN OR [facility];  Service: Vascular    SALPINGECTOMY Left 04/12/2000    Ectopic pregnancy    SALPINGECTOMY Right 11/22/2015    at time of total vaginal hysterectomy    TONSILECTOMY, ADENOIDECTOMY, BILATERAL MYRINGOTOMY AND TUBES      TOTAL VAGINAL HYSTERECTOMY  11/22/2015    With R salpingectomy       No current facility-administered medications for this encounter.    Current Outpatient Medications:     ADALIMUMAB PEN CITRATE FREE 40 MG/0.4 ML, Inject the contents of 1 pen (40 mg total) under the skin every fourteen (14) days., Disp: 6 each, Rfl: 3    adalimumab 80 mg/0.8 mL-40 mg/0.4 mL PnKt, Inject the contents of 1 pen (80mg ) under the skin for one dose, THEN 1 pen (40mg ) every 14 days thereafter., Disp: 3 each, Rfl: 0    allopurinoL (ZYLOPRIM) 100 MG tablet, Take 1 tablet (100 mg total) by mouth Every Tuesday, Thursday and Saturday., Disp: 36 tablet, Rfl: 3    amLODIPine (NORVASC) 10 MG tablet, Take 1 tablet (10 mg total) by mouth daily., Disp: 90 tablet, Rfl: 0    apixaban (ELIQUIS) 5 mg Tab, Take 1 tablet (5 mg total) by mouth in the morning., Disp: 30 tablet, Rfl: 0    aspirin 81 MG chewable tablet, Chew 1 tablet (81 mg total) daily., Disp: 30 tablet, Rfl: 1    atorvastatin (LIPITOR) 80 MG tablet, Take 1 tablet (80 mg total) by mouth daily., Disp: 30 tablet, Rfl: 0    beclomethasone dipropionate (QVAR REDIHALER) 80 mcg/actuation inhaler, 1 puff every morning, 2 puffs every evening (Patient taking differently: continuous as needed. 1 puff every morning, 2 puffs every evening as needed), Disp: 10.6 g, Rfl: 0    calcitriol (ROCALTROL) 0.5 MCG capsule, Take 2  capsules (1 mcg total) by mouth daily., Disp: 180 capsule, Rfl: 3    cinacalcet (SENSIPAR) 90 MG tablet, Take 1 tablet (90 mg total) by mouth daily., Disp: 90 tablet, Rfl: 3    evolocumab (REPATHA SURECLICK) 140 mg/mL PnIj, Inject the contents of 1 pen (140 mg) under the skin every fourteen (14) days., Disp: 6 mL, Rfl: 3    fluocinolone 0.01 % external oil, Apply topically daily as needed. Use for psoriasis and scalp scaling., Disp: 120 mL, Rfl: 5    furosemide (LASIX) 40 MG tablet, Take 1 tablet (40 mg total) by mouth 3 (three) times a week. To be taken once a day if you develop leg swelling. (Patient not taking: Reported on 03/20/2022), Disp: 36 tablet, Rfl: 3    isosorbide mononitrate (IMDUR) 120 MG 24 hr tablet, Take 1 tablet (120 mg total) by mouth daily., Disp: 30 tablet, Rfl: 0    ondansetron (ZOFRAN) 4 MG tablet, Take 1 tablet (4 mg total) by mouth every six (6) hours for 3 days., Disp: 12 tablet, Rfl: 0    sevelamer (RENVELA) 800 mg tablet, Take 1 tablet (800 mg total) by mouth., Disp: , Rfl:     triamcinolone (KENALOG) 0.1 % ointment, Apply topically Two (2) times a day. To psoriasis spots. Stop when skin is clear, Disp: 454 g, Rfl: 2    Allergies  Cephalexin, Mango, Peach (prunus persica), Wellbutrin [bupropion hcl], Egg, Fish containing products, Lactase-rennet, Lexiscan [regadenoson], Mushroom, and Tomato (solanum lycopersicum)    Family History   Adopted: Yes   Problem Relation Age of Onset    Heart attack Mother 50    Alcohol abuse Mother     Mental illness Mother     No Known Problems Father     No Known Problems Sister     No Known Problems Daughter     No Known Problems Maternal Grandmother     No Known Problems Maternal Grandfather     No Known Problems Paternal Grandmother     No Known Problems Paternal Grandfather     No Known Problems Brother     No Known Problems Son     No Known Problems Maternal Aunt     No Known Problems Maternal Uncle     No Known Problems Paternal Aunt     No Known Problems Paternal Uncle     No Known Problems Other     Anesthesia problems Neg Hx     Broken bones Neg Hx     Cancer Neg Hx     Clotting disorder Neg Hx     Collagen disease Neg Hx     Diabetes Neg Hx     Dislocations Neg Hx     Fibromyalgia Neg Hx     Gout Neg Hx     Hemophilia Neg Hx     Osteoporosis Neg Hx     Rheumatologic disease Neg Hx     Scoliosis Neg Hx     Severe sprains Neg Hx     Sickle cell anemia Neg Hx     Spinal Compression Fracture Neg Hx     Melanoma Neg Hx     Basal cell carcinoma Neg Hx     Squamous cell carcinoma Neg Hx     Breast cancer Neg Hx        Social History  Social History     Tobacco Use    Smoking status: Former     Packs/day: 2.00  Years: 1.00     Additional pack years: 0.00     Total pack years: 2.00     Types: Cigarettes     Quit date: 06/30/2010     Years since quitting: 12.1    Smokeless tobacco: Never   Vaping Use    Vaping Use: Never used   Substance Use Topics    Alcohol use: No    Drug use: No        Physical Exam     VITAL SIGNS:      Vitals:    08/22/22 1829 08/22/22 2004 08/22/22 2042   BP: 97/58  100/76   Pulse: 105  87   Resp: 18  22   Temp: (!) 38.1 ??C (100.6 ??F) 36.7 ??C (98 ??F)    TempSrc: Oral Oral    SpO2: 100% 98% 96%   Weight: 85.7 kg (189 lb)         Physical Exam   Constitutional: She appears healthy. No distress.   HENT:   Nose: Nose normal.   Mouth/Throat: Oropharynx is clear.   Eyes: Pupils are equal, round, and reactive to light. Conjunctivae are normal.   Cardiovascular: Normal rate, regular rhythm, normal heart sounds, intact distal pulses and normal pulses.   Pulmonary/Chest: Effort normal and breath sounds normal. She has no wheezes. She has no rales. She exhibits no tenderness.   Abdominal: Soft. She exhibits no distension. There is no abdominal tenderness.   No rebound or guarding   Musculoskeletal:         General: No deformity or edema.   Neurological: She is alert and oriented to person, place, and time.   Skin: Skin is warm and dry.        Radiology     XR Chest Portable   Final Result   No radiographic evidence of acute cardiopulmonary pathology.              Laboratory Data     Lab Results Component Value Date    WBC 7.0 08/22/2022    HGB 14.8 08/22/2022    HCT 44.4 (H) 08/22/2022    PLT 264 08/22/2022       Lab Results   Component Value Date    NA 141 08/22/2022    K 3.7 08/22/2022    CL 107 08/22/2022    CO2 21.7 08/22/2022    BUN 45 (H) 08/22/2022    CREATININE 9.12 (H) 08/22/2022    GLU 102 08/22/2022    CALCIUM 8.8 08/22/2022    MG 1.9 06/24/2022    PHOS 3.1 10/24/2020       Lab Results   Component Value Date    BILITOT 0.3 08/22/2022    BILIDIR 0.30 08/11/2020    PROT 7.9 08/22/2022    ALBUMIN 3.3 (L) 08/22/2022    ALT 16 08/22/2022    AST 29 08/22/2022    ALKPHOS 137 (H) 08/22/2022       Lab Results   Component Value Date    INR 0.97 06/24/2022    APTT 30.8 03/23/2022       Pertinent labs & imaging results that were available during my care of the patient were reviewed by me and considered in my medical decision making (see chart for details).    Portions of this record have been created using Scientist, clinical (histocompatibility and immunogenetics). Dictation errors have been sought, but may not have been identified and corrected.    Documentation assistance was provided by Johnathan Hausen, Scribe, on  August 22, 2022 at 6:48 PM for Eppie Gibson, MD.    August 22, 2022 8:51 PM. Documentation assistance provided by the above mentioned scribe. I was present during the time the encounter was recorded. The information recorded by the scribe was done at my direction and has been reviewed and validated by me.   Antionette Fairy, MD       Eppie Gibson, MD  Resident  08/22/22 [phone]

## 2022-08-23 NOTE — Unmapped (Signed)
Presents for fever, cough, fatigue and body aches since Thurs. Peritoneal Dialysis at home. Drowsy during triage. States that she took Tylenol x 1 hour ago. Endorses SOB with movement.

## 2022-08-31 NOTE — Unmapped (Signed)
Blood Culture  Order: [id number]  Status: Final result      Blood Culture  Order: [id number]  Status: Final result      Final results reviewed. Both blood cultures are negative. No further actions needed.

## 2022-09-06 ENCOUNTER — Emergency Department
Admit: 2022-09-06 | Discharge: 2022-09-07 | Disposition: A | Discharge status: Home / Self Care | Payer: MEDICARE | Attending: Emergency Medicine

## 2022-09-06 ENCOUNTER — Ambulatory Visit
Admit: 2022-09-06 | Discharge: 2022-09-07 | Disposition: A | Discharge status: Home / Self Care | Payer: MEDICARE | Attending: Emergency Medicine

## 2022-09-06 DIAGNOSIS — I998 Other disorder of circulatory system: Principal | ICD-10-CM

## 2022-09-06 LAB — CBC W/ AUTO DIFF
BASOPHILS ABSOLUTE COUNT: 0.1 10*9/L (ref 0.0–0.1)
BASOPHILS RELATIVE PERCENT: 1.4 %
EOSINOPHILS ABSOLUTE COUNT: 0.1 10*9/L (ref 0.0–0.5)
EOSINOPHILS RELATIVE PERCENT: 1.2 %
HEMATOCRIT: 40.5 % (ref 34.0–44.0)
HEMOGLOBIN: 13.1 g/dL (ref 11.3–14.9)
LYMPHOCYTES ABSOLUTE COUNT: 1.9 10*9/L (ref 1.1–3.6)
LYMPHOCYTES RELATIVE PERCENT: 22.1 %
MEAN CORPUSCULAR HEMOGLOBIN CONC: 32.3 g/dL (ref 32.0–36.0)
MEAN CORPUSCULAR HEMOGLOBIN: 28.9 pg (ref 25.9–32.4)
MEAN CORPUSCULAR VOLUME: 89.5 fL (ref 77.6–95.7)
MEAN PLATELET VOLUME: 7.2 fL (ref 6.8–10.7)
MONOCYTES ABSOLUTE COUNT: 0.6 10*9/L (ref 0.3–0.8)
MONOCYTES RELATIVE PERCENT: 6.3 %
NEUTROPHILS ABSOLUTE COUNT: 6.1 10*9/L (ref 1.8–7.8)
NEUTROPHILS RELATIVE PERCENT: 69 %
NUCLEATED RED BLOOD CELLS: 0 /100{WBCs} (ref <=4)
PLATELET COUNT: 374 10*9/L (ref 150–450)
RED BLOOD CELL COUNT: 4.52 10*12/L (ref 3.95–5.13)
RED CELL DISTRIBUTION WIDTH: 13.5 % (ref 12.2–15.2)
WBC ADJUSTED: 8.8 10*9/L (ref 3.6–11.2)

## 2022-09-06 LAB — LIPASE: LIPASE: 46 U/L (ref 12–53)

## 2022-09-06 LAB — COMPREHENSIVE METABOLIC PANEL
ALBUMIN: 3.4 g/dL (ref 3.4–5.0)
ALKALINE PHOSPHATASE: 114 U/L (ref 46–116)
ALT (SGPT): 12 U/L (ref 10–49)
ANION GAP: 19 mmol/L — ABNORMAL HIGH (ref 5–14)
AST (SGOT): 18 U/L (ref <=34)
BILIRUBIN TOTAL: 0.3 mg/dL (ref 0.3–1.2)
CALCIUM: 7.7 mg/dL — ABNORMAL LOW (ref 8.7–10.4)
CHLORIDE: 99 mmol/L (ref 98–107)
CO2: 18.1 mmol/L — ABNORMAL LOW (ref 20.0–31.0)
GLUCOSE RANDOM: 111 mg/dL (ref 70–179)
PROTEIN TOTAL: 7.4 g/dL (ref 5.7–8.2)
SODIUM: 136 mmol/L (ref 135–145)

## 2022-09-06 LAB — MAGNESIUM: MAGNESIUM: 1.9 mg/dL (ref 1.6–2.6)

## 2022-09-06 LAB — TSH: THYROID STIMULATING HORMONE: 1.737 u[IU]/mL (ref 0.550–4.780)

## 2022-09-06 NOTE — Unmapped (Signed)
Pt was dx with COVID 08/22/2022. Pt does peritoneal dialysis at home. Pt stating no appetite and n/v/d along with continued weakness. Patient her to be evaluated for continued sx.

## 2022-09-07 LAB — URINALYSIS WITH MICROSCOPY WITH CULTURE REFLEX
BILIRUBIN UA: NEGATIVE
GLUCOSE UA: NEGATIVE
KETONES UA: NEGATIVE
NITRITE UA: NEGATIVE
PH UA: 6 (ref 5.0–9.0)
PROTEIN UA: 600 — AB
RBC UA: 2 /HPF (ref <=4)
SPECIFIC GRAVITY UA: 1.018 (ref 1.003–1.030)
SQUAMOUS EPITHELIAL: 15 /HPF — ABNORMAL HIGH (ref 0–5)
UROBILINOGEN UA: <2
WBC UA: 15 /HPF — ABNORMAL HIGH (ref 0–5)

## 2022-09-07 LAB — BASIC METABOLIC PANEL
ANION GAP: 17 mmol/L — ABNORMAL HIGH (ref 5–14)
BLOOD UREA NITROGEN: 104 mg/dL — ABNORMAL HIGH (ref 9–23)
BUN / CREAT RATIO: 5
CALCIUM: 7.1 mg/dL — ABNORMAL LOW (ref 8.7–10.4)
CHLORIDE: 102 mmol/L (ref 98–107)
CO2: 21 mmol/L (ref 20.0–31.0)
CREATININE: 23.03 mg/dL — ABNORMAL HIGH
EGFR CKD-EPI (2021) FEMALE: 2 mL/min/{1.73_m2} — ABNORMAL LOW (ref >=60)
GLUCOSE RANDOM: 91 mg/dL (ref 70–179)
POTASSIUM: 3.3 mmol/L — ABNORMAL LOW (ref 3.4–4.8)
SODIUM: 140 mmol/L (ref 135–145)

## 2022-09-07 MED ADMIN — acetaminophen (OFIRMEV) 10 mg/mL injection 1,000 mg: 1000 mg | INTRAVENOUS | @ 12:00:00 | Stop: 2022-09-07

## 2022-09-07 MED ADMIN — acetaminophen (OFIRMEV) 10 mg/mL injection 1,000 mg: 1000 mg | INTRAVENOUS | @ 04:00:00 | Stop: 2022-09-06

## 2022-09-07 MED ADMIN — prochlorperazine (COMPAZINE) injection 5 mg: 5 mg | INTRAVENOUS | @ 12:00:00 | Stop: 2022-09-07

## 2022-09-07 NOTE — Unmapped (Signed)
Received sign out from previous provider.    Patient Summary: Megan Robertson is a 51 y.o. female with a past medical history of HTN, hyperlipidemia, CVA, ESRD (on PD), GERD, and asthma who presented to the ED for evaluation of 3-4 weeks of generalized malaise, headaches, myalgias, fatigue, nausea, and daily episodes of NBNB emesis and non-bloody diarrhea in the setting of recent COVID diagnosis on 08/22/2022. Vital signs notable for borderline hypotension to 92/54; otherwise within normal limits.   EKG without acute changes. CXR negative. Labs CBC and CMP overall reassuring.   Action List:   TSH, viral swab, magnesium, lipase, and UA pending  Anticipate transfer to [facility] for PD per prior team  Updates  ED Course as of 09/07/22 0602   Thu Sep 06, 2022   2306 Received sign out at 2300. Viral symptoms for several weeks, initial COVID test although resolved.     Will need transfer for paracentesis for r/o SBP.    2346 Discussed likely transfer to main for PD diagnostic paracentesis.Patient BP have been stable.    Fri Sep 07, 2022   0006 SARS-CoV-2 PCR(!): Positive  Likely positive from prior diagnosis.    6644 Spoke with Dr. Claretha Cooper, transfer ED to ED for PD fluid analysis.  Patient is hemodynamically stable at this time.  Otherwise reassuring work-up.      Documentation assistance was provided by Johnathan Hausen, Scribe, on September 06, 2022 at 11:08 PM for Cheryle Horsfall, DO.    Documentation assistance provided by the above mentioned scribe. I was present during the time the encounter was recorded. The information recorded by the scribe was done at my direction and has been reviewed and validated by me.

## 2022-09-07 NOTE — Unmapped (Signed)
ED Progress Note    Received sign out from previous provider.    Patient Summary: Megan Robertson is a 51 y.o. female ast medical history of HTN, hyperlipidemia, CVA, ESRD (on PD), GERD, and asthma who presented to the ED for evaluation of 3-4 weeks of generalized malaise, headaches, myalgias, fatigue, nausea, and daily episodes of NBNB emesis and non-bloody diarrhea in the setting of recent COVID diagnosis on 08/22/2022      Patient transferred from [facility] to have analysis of her PD fluid  Assessment: No acute distress, resting comfortably. VSS. Abdomen, soft, nt, nd.   Action List:   0900: Spoke with Nephro regarding PD Nurse availability. Anticipate within next hour. Will reach out if not completed by 1000.   Nephrology team unable to obtain fluid from PD catheter.  Spoke with nephrology fellow regarding lack of fluid availability for sampling.  They state that if fluid not present combined with the lack of any abdominal pain or peritoneal signs diminish via likelihood of peritoneal infection.  No further evaluation is required and nephro signed off.  Patient meets criteria for discharge at this time is discharged home with adequate and proper counseling on return precautions.  Updates  ED Course as of 09/07/22 1437   Fri Sep 07, 2022   1027 PD team unable to obtain fluid due to no fluid in peritoneum. Spoke with nephrology that due to non-availability of fluid in the absence of peritoneal signs or abdominal pain the likelihood of peritonitis is very low.

## 2022-09-07 NOTE — Unmapped (Signed)
Accesses PD catheter, unable to draw cultures and cell count, notified Nephrology and ED in charge, dressing changed, exit site looks clean and dry.

## 2022-09-07 NOTE — Unmapped (Signed)
Bed: 15-B  Expected date:   Expected time:   Means of arrival:   Comments:  HBR

## 2022-09-07 NOTE — Unmapped (Signed)
ED Progress Note    Megan Robertson is a 51 y.o. female with a past medical history of HTN, hyperlipidemia, CVA, ESRD (on PD), GERD, and asthma who presented to the ED for evaluation of 3-4 weeks of generalized malaise, headaches, myalgias, fatigue, nausea, and daily episodes of NBNB emesis and non-bloody diarrhea in the setting of recent COVID diagnosis on 08/22/2022     Patient transferred from [facility] to have analysis of her PD fluid    My assessment patient is in no acute distress.  Her vital signs within normal limits.  Her abdomen is soft and nontender in all quadrants    5:41 AM I have paged nephrology who will let PD nurse know

## 2022-09-07 NOTE — Unmapped (Signed)
[facility] Emergency Department Provider Note      ED Course, Assessment, and Plan     ED Course as of 09/06/22 2257   Thu Sep 06, 2022   559 51 year old female presenting to the ED for evaluation of multiple symptoms including headache, nausea and vomiting, diarrhea, body aches, chills, and fatigue over the past several weeks since being diagnosed with COVID.  Vitals significant for mild tachycardia and borderline hypotension.  On exam patient is uncomfortable appearing but in no acute distress, lungs clear bilaterally with normal work of breathing, abdomen soft nontender, mentating appropriately following all commands.  Differential would include electrolyte disturbance, thyroid dysfunction, additional viral respiratory illness, spontaneous bacterial peritonitis, pneumonia, among others.  EKG showing normal sinus rhythm with no acute ischemic changes, no ST elevations or depressions.  Chest x-ray without focal opacity or pulmonary edema.  Further care in the ED signed out to oncoming provider at shift change pending remainder of work-up and final disposition.     _____________________________________________________________________    The case was discussed with the attending physician who is in agreement with the above assessment and plan.    Additional Medical Decision Making     - Any discussion of this patient's case/presentation between myself and consultants, admitting teams, or other team members has been documented above.  - Imaging and other studies, if performed, that were available during my care of the patient were independently reviewed and interpreted by me and considered in my medical decision making as documented above.  - External records reviewed: 03/23/2022 intake note    History     Chief Complaint:   Chief Complaint   Patient presents with    Weakness       History of Present Illness:  Megan Robertson is a 51 y.o. female with a past medical history of HTN, HLD, CVA, asthma, ESRD (on PD), and GERD presenting to the Emergency Department for evaluation of weakness. The patient reports 2-3 weeks of malaise, headaches, myalgias, fatigue, and nausea with daily vomiting and diarrhea in the setting of COVID diagnosis 08/22/2022. She reports decreased PO intake secondary to poor appetite. Denies fevers.     Past Medical History:  Past Medical History:   Diagnosis Date    Abnormal mammogram     Abnormal Pap smear of cervix     Allergic     Anemia     Anxiety     Arthritis     Asthma     Back pain     Cancer (CMS-HCC) Ovarian    Depression     Diabetes mellitus (CMS-HCC) Borderline    Eczema     ESRD (end stage renal disease) on dialysis (CMS-HCC)     FSGS (focal segmental glomerulosclerosis) 1997    renal biopsy at [facility]    GERD (gastroesophageal reflux disease)     Gout     H/O adenoidectomy     had adenoids removed    Hypercholesteremia     Hypertension     Keloid     Ovarian cancer (CMS-HCC)     Pancreatitis     Psoriasis     Seizure (CMS-HCC)     unknown etiology; none for several years    Stroke (CMS-HCC) 2000       Medications:   No current facility-administered medications for this encounter.    Current Outpatient Medications:     ADALIMUMAB PEN CITRATE FREE 40 MG/0.4 ML, Inject the contents of 1 pen (40 mg total)  under the skin every fourteen (14) days., Disp: 6 each, Rfl: 3    adalimumab 80 mg/0.8 mL-40 mg/0.4 mL PnKt, Inject the contents of 1 pen (80mg ) under the skin for one dose, THEN 1 pen (40mg ) every 14 days thereafter., Disp: 3 each, Rfl: 0    allopurinoL (ZYLOPRIM) 100 MG tablet, Take 1 tablet (100 mg total) by mouth Every Tuesday, Thursday and Saturday., Disp: 36 tablet, Rfl: 3    amLODIPine (NORVASC) 10 MG tablet, Take 1 tablet (10 mg total) by mouth daily., Disp: 90 tablet, Rfl: 0    apixaban (ELIQUIS) 5 mg Tab, Take 1 tablet (5 mg total) by mouth in the morning., Disp: 30 tablet, Rfl: 0    aspirin 81 MG chewable tablet, Chew 1 tablet (81 mg total) daily., Disp: 30 tablet, Rfl: 1 atorvastatin (LIPITOR) 80 MG tablet, Take 1 tablet (80 mg total) by mouth daily., Disp: 30 tablet, Rfl: 0    beclomethasone dipropionate (QVAR REDIHALER) 80 mcg/actuation inhaler, 1 puff every morning, 2 puffs every evening (Patient taking differently: continuous as needed. 1 puff every morning, 2 puffs every evening as needed), Disp: 10.6 g, Rfl: 0    calcitriol (ROCALTROL) 0.5 MCG capsule, Take 2 capsules (1 mcg total) by mouth daily., Disp: 180 capsule, Rfl: 3    cinacalcet (SENSIPAR) 90 MG tablet, Take 1 tablet (90 mg total) by mouth daily., Disp: 90 tablet, Rfl: 3    evolocumab (REPATHA SURECLICK) 140 mg/mL PnIj, Inject the contents of 1 pen (140 mg) under the skin every fourteen (14) days., Disp: 6 mL, Rfl: 3    fluocinolone 0.01 % external oil, Apply topically daily as needed. Use for psoriasis and scalp scaling., Disp: 120 mL, Rfl: 5    furosemide (LASIX) 40 MG tablet, Take 1 tablet (40 mg total) by mouth 3 (three) times a week. To be taken once a day if you develop leg swelling. (Patient not taking: Reported on 03/20/2022), Disp: 36 tablet, Rfl: 3    isosorbide mononitrate (IMDUR) 120 MG 24 hr tablet, Take 1 tablet (120 mg total) by mouth daily., Disp: 30 tablet, Rfl: 0    sevelamer (RENVELA) 800 mg tablet, Take 1 tablet (800 mg total) by mouth., Disp: , Rfl:     triamcinolone (KENALOG) 0.1 % ointment, Apply topically Two (2) times a day. To psoriasis spots. Stop when skin is clear, Disp: 454 g, Rfl: 2  Patient's Medications   New Prescriptions    No medications on file   Previous Medications    ADALIMUMAB 80 MG/0.8 ML-40 MG/0.4 ML PNKT    Inject the contents of 1 pen (80mg ) under the skin for one dose, THEN 1 pen (40mg ) every 14 days thereafter.    ADALIMUMAB PEN CITRATE FREE 40 MG/0.4 ML    Inject the contents of 1 pen (40 mg total) under the skin every fourteen (14) days.    ALLOPURINOL (ZYLOPRIM) 100 MG TABLET    Take 1 tablet (100 mg total) by mouth Every Tuesday, Thursday and Saturday.    AMLODIPINE (NORVASC) 10 MG TABLET    Take 1 tablet (10 mg total) by mouth daily.    APIXABAN (ELIQUIS) 5 MG TAB    Take 1 tablet (5 mg total) by mouth in the morning.    ASPIRIN 81 MG CHEWABLE TABLET    Chew 1 tablet (81 mg total) daily.    ATORVASTATIN (LIPITOR) 80 MG TABLET    Take 1 tablet (80 mg total) by mouth daily.    BECLOMETHASONE  DIPROPIONATE (QVAR REDIHALER) 80 MCG/ACTUATION INHALER    1 puff every morning, 2 puffs every evening    CALCITRIOL (ROCALTROL) 0.5 MCG CAPSULE    Take 2 capsules (1 mcg total) by mouth daily.    CINACALCET (SENSIPAR) 90 MG TABLET    Take 1 tablet (90 mg total) by mouth daily.    EVOLOCUMAB (REPATHA SURECLICK) 140 MG/ML PNIJ    Inject the contents of 1 pen (140 mg) under the skin every fourteen (14) days.    FLUOCINOLONE 0.01 % EXTERNAL OIL    Apply topically daily as needed. Use for psoriasis and scalp scaling.    FUROSEMIDE (LASIX) 40 MG TABLET    Take 1 tablet (40 mg total) by mouth 3 (three) times a week. To be taken once a day if you develop leg swelling.    ISOSORBIDE MONONITRATE (IMDUR) 120 MG 24 HR TABLET    Take 1 tablet (120 mg total) by mouth daily.    SEVELAMER (RENVELA) 800 MG TABLET    Take 1 tablet (800 mg total) by mouth.    TRIAMCINOLONE (KENALOG) 0.1 % OINTMENT    Apply topically Two (2) times a day. To psoriasis spots. Stop when skin is clear   Modified Medications    No medications on file   Discontinued Medications    No medications on file       Allergies:   Cephalexin, Mango, Peach (prunus persica), Wellbutrin [bupropion hcl], Egg, Fish containing products, Lactase-rennet, Lexiscan [regadenoson], Mushroom, and Tomato (solanum lycopersicum)    Past Surgical History:   Past Surgical History:   Procedure Laterality Date    APPENDECTOMY      BREAST EXCISIONAL BIOPSY Right 11/28/2016    CHEMOTHERAPY      for ovarian CA in 2000    CHG US GUIDE, VASCULAR ACCESS N/A 01/29/2020    Procedure: ULTRASOUND GUIDANCE FOR VASC ACCESS REQUIRING [place] EVAL OF POTENTIAL ACCESS SITES; Surgeon: Leona Carry, MD;  Location: MAIN OR [facility];  Service: Transplant    CHG US GUIDE, VASCULAR ACCESS N/A 06/08/2020    Procedure: ULTRASOUND GUIDANCE FOR VASC ACCESS REQUIRING [place] EVAL OF POTENTIAL ACCESS SITES;  Surgeon: Leona Carry, MD;  Location: MAIN OR [facility];  Service: Transplant    CHOLECYSTECTOMY      COMBINED HYSTEROSCOPY DIAGNOSTIC / D&C  07/07/2015    Planned for endometrial ablation, but patient had uterine perforation after D&C and unable to perform    HYSTERECTOMY      LEFT OOPHORECTOMY Left 04/12/2000    Removed with L fallopian tube for ectopic pregnancy    OOPHORECTOMY      PR COLONOSCOPY W/BIOPSY SINGLE/MULTIPLE  09/12/2021    Procedure: COLONOSCOPY, FLEXIBLE, PROXIMAL TO SPLENIC FLEXURE; WITH BIOPSY, SINGLE OR MULTIPLE;  Surgeon: Maris Berger, MD;  Location: [facility]URES [facility];  Service: Gastroenterology    PR COLSC FLX W/RMVL OF TUMOR POLYP LESION SNARE TQ N/A 09/12/2021    Procedure: COLONOSCOPY FLEX; W/REMOV TUMOR/LES BY SNARE;  Surgeon: Maris Berger, MD;  Location: [facility]URES [facility];  Service: Gastroenterology    PR CREAT AV FISTULA,AUTOGENOUS GRAFT Left 01/29/2020    Procedure: Create Av Fistula (Separt Proc); Autog Gft;  Surgeon: Leona Carry, MD;  Location: MAIN OR [facility];  Service: Transplant    PR CREAT AV FISTULA,NON-AUTOGENOUS GRAFT Left 06/08/2020    Procedure: CREATE AV FISTULA (SEPARATE PROC); NONAUTOGENOUS GRAFT (EG, BIOLOGICAL COLLAGEN, THERMOPLASTIC GRAFT);  Surgeon: Leona Carry, MD;  Location: MAIN OR [facility];  Service:  Transplant    PR EXPLORATION N/FLWD SURG UPPER EXTREMITY ARTERY Left 08/02/2020    Procedure: Exploration Not Followed By Surgical Repair, Artery; Upper Extremity (Eg, Axillary, Brachial, Radial, Ulnar);  Surgeon: Earney Mallet, MD;  Location: MAIN OR [facility];  Service: Vascular    PR INCIS/DRAIN ARM,DEEP ABSC/HEMATOMA Left 08/14/2020    Procedure: INCISION AND DRAINAGE, UPPER ARM OR ELBOW AREA; DEEP ABSCESS OR HEMATOMA;  Surgeon: Earney Mallet, MD;  Location: MAIN OR [place];  Service: Vascular    PR INSERTION TUNNEL INTRAPERITONEAL CATH DIAL OPEN Midline 02/01/2021    Procedure: INSERTION OF INTRAPERITONEAL CANNULA OR CATHETER FOR DRAINAGE OR DIALYSIS; PERMANENT;  Surgeon: Loney Hering, MD;  Location: MAIN OR ;  Service: Transplant    PR REMV ART CLOT AXILL-BRACH,ARM INCIS Left 08/02/2020    Procedure: Embolect/Thrombec; Axilry/Brachial Art-Arm Incs;  Surgeon: Earney Mallet, MD;  Location: MAIN OR [facility];  Service: Vascular    PR UPPER GI ENDOSCOPY,BIOPSY N/A 08/29/2017    Procedure: UGI ENDOSCOPY; WITH BIOPSY, SINGLE OR MULTIPLE;  Surgeon: Neysa Hotter, MD;  Location: GI PROCEDURES [place] [facility]  Service: Gastroenterology    PR VEIN BYPASS GRAFT,SUBCL-BRACHIAL Left 08/05/2020    Procedure: Bypass Graft, With Vein; Subclavian-Brachial;  Surgeon: Earney Mallet, MD;  Location: MAIN OR [facility];  Service: Vascular    SALPINGECTOMY Left 04/12/2000    Ectopic pregnancy    SALPINGECTOMY Right 11/22/2015    at time of total vaginal hysterectomy    TONSILECTOMY, ADENOIDECTOMY, BILATERAL MYRINGOTOMY AND TUBES      TOTAL VAGINAL HYSTERECTOMY  11/22/2015    With R salpingectomy       Social History:   Social History     Tobacco Use    Smoking status: Former     Packs/day: 2.00     Years: 1.00     Additional pack years: 0.00     Total pack years: 2.00     Types: Cigarettes     Quit date: 06/30/2010     Years since quitting: 12.1    Smokeless tobacco: Never   Substance Use Topics    Alcohol use: Not Currently       Family History:  Family History   Adopted: Yes   Problem Relation Age of Onset    Heart attack Mother 54    Alcohol abuse Mother     Mental illness Mother     No Known Problems Father     No Known Problems Sister     No Known Problems Daughter     No Known Problems Maternal Grandmother     No Known Problems Maternal Grandfather     No Known Problems Paternal Grandmother     No Known Problems Paternal Grandfather     No Known Problems Brother     No Known Problems Son     No Known Problems Maternal Aunt     No Known Problems Maternal Uncle     No Known Problems Paternal Aunt     No Known Problems Paternal Uncle     No Known Problems Other     Anesthesia problems Neg Hx     Broken bones Neg Hx     Cancer Neg Hx     Clotting disorder Neg Hx     Collagen disease Neg Hx     Diabetes Neg Hx     Dislocations Neg Hx     Fibromyalgia Neg Hx     Gout Neg Hx     Hemophilia  Neg Hx     Osteoporosis Neg Hx     Rheumatologic disease Neg Hx     Scoliosis Neg Hx     Severe sprains Neg Hx     Sickle cell anemia Neg Hx     Spinal Compression Fracture Neg Hx     Melanoma Neg Hx     Basal cell carcinoma Neg Hx     Squamous cell carcinoma Neg Hx     Breast cancer Neg Hx         Physical Exam     Vital Signs:    BP 92/54  - Pulse 88  - Temp 35.9 ??C (96.6 ??F)  - Resp 16  - Wt 82.2 kg (181 lb 5.3 oz)  - LMP  (LMP Unknown)  - SpO2 100%  - BMI 33.17 kg/m??     General: Awake and alert.  Skin: Skin is warm and dry.  Lungs: Normal respiratory effort.  Heart: Regular rhythm, normal heart sounds.  Abdomen: Soft and non-tender to palpation.  Musculoskeletal: No deformities or tenderness.  Neurological: No gross focal neurologic deficits are appreciated.  Psychiatric: Normal affect and behavior for situation  _____________________________________________________________________    Please note - This documentation was generated using dictation and/or voice recognition software, and as such, may contain spelling or other transcription errors. Any questions regarding the content of this documentation should be directed to the individual who electronically signed.    Documentation assistance was provided by Everlene Balls, Scribe, on September 06, 2022 at 10:31 PM for Margo Aye, MD.      Documentation assistance was provided by the scribe in my presence.  The documentation recorded by the scribe has been reviewed by me and accurately reflects the services I personally performed.           Anders Grant, MD  Resident  09/06/22 [phone]

## 2022-09-15 ENCOUNTER — Ambulatory Visit: Admit: 2022-09-15 | Discharge: 2022-09-19 | Payer: MEDICARE

## 2022-09-15 ENCOUNTER — Ambulatory Visit
Admit: 2022-09-15 | Discharge: 2022-09-19 | Disposition: A | Discharge status: Home / Self Care | Payer: MEDICARE | Admitting: Internal Medicine

## 2022-09-15 ENCOUNTER — Encounter: Admit: 2022-09-15 | Discharge: 2022-09-19 | Payer: MEDICARE | Attending: Internal Medicine

## 2022-09-15 ENCOUNTER — Encounter
Admit: 2022-09-15 | Discharge: 2022-09-19 | Disposition: A | Discharge status: Home / Self Care | Payer: MEDICARE | Attending: Internal Medicine | Admitting: Internal Medicine

## 2022-09-15 LAB — COMPREHENSIVE METABOLIC PANEL
ALBUMIN: 3.8 g/dL (ref 3.4–5.0)
ALKALINE PHOSPHATASE: 117 U/L — ABNORMAL HIGH (ref 46–116)
ALT (SGPT): 8 U/L — ABNORMAL LOW (ref 10–49)
ANION GAP: 17 mmol/L — ABNORMAL HIGH (ref 5–14)
AST (SGOT): 14 U/L (ref <=34)
BILIRUBIN TOTAL: 0.5 mg/dL (ref 0.3–1.2)
BLOOD UREA NITROGEN: 50 mg/dL — ABNORMAL HIGH (ref 9–23)
BUN / CREAT RATIO: 3
CALCIUM: 8.8 mg/dL (ref 8.7–10.4)
CHLORIDE: 100 mmol/L (ref 98–107)
CO2: 24 mmol/L (ref 20.0–31.0)
CREATININE: 15.39 mg/dL — ABNORMAL HIGH
EGFR CKD-EPI (2021) FEMALE: 3 mL/min/{1.73_m2} — ABNORMAL LOW (ref >=60)
GLUCOSE RANDOM: 122 mg/dL (ref 70–179)
POTASSIUM: 3 mmol/L — ABNORMAL LOW (ref 3.4–4.8)
PROTEIN TOTAL: 7.9 g/dL (ref 5.7–8.2)
SODIUM: 141 mmol/L (ref 135–145)

## 2022-09-15 LAB — CBC W/ AUTO DIFF
BASOPHILS ABSOLUTE COUNT: 0.1 10*9/L (ref 0.0–0.1)
BASOPHILS RELATIVE PERCENT: 0.4 %
EOSINOPHILS ABSOLUTE COUNT: 0.1 10*9/L (ref 0.0–0.5)
EOSINOPHILS RELATIVE PERCENT: 0.5 %
HEMATOCRIT: 42.7 % (ref 34.0–44.0)
HEMOGLOBIN: 14.4 g/dL (ref 11.3–14.9)
LYMPHOCYTES ABSOLUTE COUNT: 1.5 10*9/L (ref 1.1–3.6)
LYMPHOCYTES RELATIVE PERCENT: 10.8 %
MEAN CORPUSCULAR HEMOGLOBIN CONC: 33.8 g/dL (ref 32.0–36.0)
MEAN CORPUSCULAR HEMOGLOBIN: 30.3 pg (ref 25.9–32.4)
MEAN CORPUSCULAR VOLUME: 89.4 fL (ref 77.6–95.7)
MEAN PLATELET VOLUME: 7.6 fL (ref 6.8–10.7)
MONOCYTES ABSOLUTE COUNT: 0.9 10*9/L — ABNORMAL HIGH (ref 0.3–0.8)
MONOCYTES RELATIVE PERCENT: 6.6 %
NEUTROPHILS ABSOLUTE COUNT: 11.5 10*9/L — ABNORMAL HIGH (ref 1.8–7.8)
NEUTROPHILS RELATIVE PERCENT: 81.7 %
PLATELET COUNT: 239 10*9/L (ref 150–450)
RED BLOOD CELL COUNT: 4.77 10*12/L (ref 3.95–5.13)
RED CELL DISTRIBUTION WIDTH: 13.3 % (ref 12.2–15.2)
WBC ADJUSTED: 14.1 10*9/L — ABNORMAL HIGH (ref 3.6–11.2)

## 2022-09-15 LAB — LIPASE: LIPASE: 40 U/L (ref 12–53)

## 2022-09-15 LAB — LACTATE SEPSIS, VENOUS 2: LACTATE BLOOD VENOUS: 2.1 mmol/L (ref 0.5–1.8)

## 2022-09-15 LAB — HIGH SENSITIVITY TROPONIN I - SINGLE
HIGH SENSITIVITY TROPONIN I: 5 ng/L (ref <=34)
HIGH SENSITIVITY TROPONIN I: 9 ng/L (ref <=34)

## 2022-09-15 LAB — D-DIMER, QUANTITATIVE: D-DIMER QUANTITATIVE (CW,ML,HL,HS,CH,JS,JC,RX): 1234 ng{FEU}/mL — ABNORMAL HIGH (ref <=500)

## 2022-09-15 LAB — PROTIME-INR
INR: 1.02
PROTIME: 11.4 s (ref 9.9–12.6)

## 2022-09-15 LAB — B-TYPE NATRIURETIC PEPTIDE: B-TYPE NATRIURETIC PEPTIDE: 2 pg/mL (ref <=100)

## 2022-09-15 LAB — MAGNESIUM: MAGNESIUM: 1.7 mg/dL (ref 1.6–2.6)

## 2022-09-15 LAB — TSH: THYROID STIMULATING HORMONE: 0.578 u[IU]/mL (ref 0.550–4.780)

## 2022-09-15 LAB — PHOSPHORUS: PHOSPHORUS: 5.5 mg/dL — ABNORMAL HIGH (ref 2.4–5.1)

## 2022-09-15 LAB — LACTATE SEPSIS, VENOUS: LACTATE BLOOD VENOUS: 2.1 mmol/L (ref 0.5–1.8)

## 2022-09-15 MED ADMIN — iohexoL (OMNIPAQUE) 350 mg iodine/mL solution 75 mL: 75 mL | INTRAVENOUS | @ 22:00:00 | Stop: 2022-09-15

## 2022-09-15 MED ADMIN — lactated ringers bolus 500 mL: 500 mL | INTRAVENOUS | @ 23:00:00 | Stop: 2022-09-15

## 2022-09-15 MED ADMIN — vancomycin (VANCOCIN) 1500 mg in sodium chloride (NS) 0.9 % 500 mL IVPB (premix): 1500 mg | INTRAVENOUS | @ 20:00:00 | Stop: 2022-09-15

## 2022-09-15 MED ADMIN — cefepime (MAXIPIME) 2 g in sodium chloride 0.9 % (NS) 100 mL IVPB-MBP: 2 g | INTRAVENOUS | @ 19:00:00 | Stop: 2022-09-15

## 2022-09-15 NOTE — Unmapped (Signed)
[facility]  Emergency Department Provider Note     ED Clinical Impression     Final diagnoses:   Weakness (Primary)   Fever, unspecified fever cause      Impression, Medical Decision Making, ED Course     Impression: 51 y.o. female who has a past medical history of Abnormal mammogram, Abnormal Pap smear of cervix, Allergic, Anemia, Anxiety, Arthritis, Asthma, Back pain, Cancer (CMS-HCC) (Ovarian), Depression, Diabetes mellitus (CMS-HCC) (Borderline), Eczema, ESRD (end stage renal disease) on dialysis (CMS-HCC), FSGS (focal segmental glomerulosclerosis) (1997), GERD (gastroesophageal reflux disease), Gout, H/O adenoidectomy, Hypercholesteremia, Hypertension, Keloid, Ovarian cancer (CMS-HCC), Pancreatitis, Psoriasis, Seizure (CMS-HCC), and Stroke (CMS-HCC) (2000). who presents with fever/chills, nausea/vomiting, and generalized weakness in the setting of recent COVID diagnosis and decreased p.o. intake, as described below.     Vitals on arrival notable for temperature 97.6 ??F, heart rate 107, BP 112/70, respirations 40, saturating 98% on room air.  Physical exam notable for ill appearance, lungs clear to auscultation bilaterally, peritoneal dialysis site with no erythema or discharge, abdomen nontender, trace bilateral peripheral edema to the ankles.    DDx/MDM: In this tachycardic patient with a history of ovarian cancer, end-stage renal disease and COVID diagnosis on 08/22/2022, there is concern for sepsis of unknown origin.  Etiology could include superficial bacterial infection in the setting of recent COVID.  Differential also includes viral infection, PE, anemia, electrolyte derangements, or fluid overload.  We will initiate sepsis protocol, obtain blood cultures and administer broad-spectrum antibiotics.  We will obtain the diagnostic work-up below.  Patient does not have abdominal tenderness nor is her concern for localized infection around the PD site so we will defer cultures of PD fluid for now.  Initial fluid bolus will be 500 mL LR due to history of renal failure.    Diagnostic workup as below.     Orders Placed This Encounter   Procedures    Blood Culture    Blood Culture    Respiratory Pathogen Panel with COVID-19    XR Chest Portable    CTA Chest W Contrast    CBC w/ Differential    Comprehensive Metabolic Panel    Lactate Sepsis, Venous    Urinalysis with Microscopy with Culture Reflex    Lipase Level    Magnesium Level    Phosphorus Level    hsTroponin I (single, no delta)    B-type natriuretic peptide (BNP)    Protime-INR    D-Dimer, Quantitative    TSH    Lactate Sepsis, Venous    Misc nursing order (Sepsis Timer)    Cardiac Monitor    Oxygen sat continuous monitoring    Notify Provider    In and Out (I & O) cath    Notify Provider    Obtain medical records    Vital signs    RN to notify pharmacy immediately that an antibiotic has been ordered from the Sepsis Order Set (if not available in the [employer] or if not yet verified)    POCT Glucose - RN Obtain    Adult Oxygen therapy    ECG 12 Lead    Insert peripheral IV       ED Course as of 09/15/22 1858   Sat Sep 15, 2022   1345 CBC w/ Differential(!):    WBC 14.1(!)   RBC 4.77   HGB 14.4   HCT 42.7   MCV 89.4   MCH 30.3   MCHC 33.8   RDW 13.3   MPV  7.6   Platelet 239   Neutrophils % 81.7   Lymphocytes % 10.8   Monocytes % 6.6   Eosinophils % 0.5   Basophils % 0.4   Absolute Neutrophils 11.5(!)   Absolute Lymphocytes 1.5   Absolute Monocytes  0.9(!)   Absolute Eosinophils 0.1   Absolute Basophils  0.1  CBC with WBC 14.1.,  Neutrophil predominance, absolute neutrophil count 11.5.   1345 D-Dimer, Quantitative(!):    D-Dimer 1,234(!)  D-dimer 1234.   1345 Phosphorus Level(!):    Phosphorus 5.5(!)  Phos 5.5   1345 hsTroponin I (single, no delta):    hsTroponin I 5  Troponin normal   1345 B-type natriuretic peptide (BNP):    BNP 2  BNP normal   1345 Protime-INR:    PT 11.4   INR 1.02  PT/INR within normal limits   1345 Lipase Level:    Lipase 40  Lipase 40 1345 Comprehensive Metabolic Panel(!):    Sodium 141   Potassium 3.0(!)   Chloride 100   CO2 24.0   Anion Gap 17(!)   Bun 50(!)   Creatinine 15.39(!)   BUN/Creatinine Ratio 3   eGFR CKD-EPI (2021) Female 3(!)   Glucose 122   Calcium 8.8   Albumin 3.8   Total Protein 7.9   Total Bilirubin 0.5   SGOT (AST) 14   ALT 8(!)   Alkaline Phosphatase 117(!)  PE with potassium 3.0 BUN 58 creatinine 15.39 alk phos 117   1347 ECG 12 Lead  ECG with normal sinus rhythm at 100 bpm, normal axis, normal intervals, no ST elevation or depression, no T wave abnormalities.   1547 Lactate Sepsis, Venous(!!):    Lactate, Venous 2.1(!!)  Lactate 2.1   1857 CTA Chest W Contrast  PTA with right groundglass opacities concerning for possible viral illness or atelectasis.   1858 Patient signed out to oncoming provider.  Plan to admit per MAO.       Independent Interpretation of Studies: I have independently interpreted the following studies:  ECG, chest x-ray    Discussion of Management With Other Providers or Support Staff: I discussed the management of this patient with the:  None    Considerations Regarding Disposition/Escalation of Care and Critical Care:  Anticipate admission  ____________________________________________    The case was discussed with the attending physician, who is in agreement with the above assessment and plan.      History     Chief Complaint  Chief Complaint   Patient presents with    Weakness       HPI   Megan Robertson is a 51 y.o. female with past medical history as below who presents with fever/chills, nausea/vomiting, and generalized weakness in the setting of recent COVID diagnosis and decreased p.o. intake, as described below.     Patient was diagnosed with COVID on 11/1 and represented to the emergency room on 09/06/2022 for similar symptoms to those she is complaining of today.  She was transferred from [facility] to [facility] at that time for possible peritoneal fluid analysis in the setting of current use of peritoneal dialysis but since she was not having abdominal pain at the time nephrology deferred further evaluation.  Since that time she has returned home and has had persistent nausea and vomiting, decreased p.o. intake, cough, shortness of breath, weakness.  She denies chest pain, abdominal pain, diarrhea, or dysuria.  She has noticed mildly increasing levels of peritoneal dialysis fluid daily.  She reports compliance with  her medication regimen.  Has not taken any new medications for this illness.    Outside Historian(s): None available.    External Records Reviewed: I have reviewed recent ED encounters for COVID infection 08/22/2022 in 09/06/2022.    Past Medical History:   Diagnosis Date    Abnormal mammogram     Abnormal Pap smear of cervix     Allergic     Anemia     Anxiety     Arthritis     Asthma     Back pain     Cancer (CMS-HCC) Ovarian    Depression     Diabetes mellitus (CMS-HCC) Borderline    Eczema     ESRD (end stage renal disease) on dialysis (CMS-HCC)     FSGS (focal segmental glomerulosclerosis) 1997    renal biopsy at [facility]    GERD (gastroesophageal reflux disease)     Gout     H/O adenoidectomy     had adenoids removed    Hypercholesteremia     Hypertension     Keloid     Ovarian cancer (CMS-HCC)     Pancreatitis     Psoriasis     Seizure (CMS-HCC)     unknown etiology; none for several years    Stroke (CMS-HCC) 2000       Past Surgical History:   Procedure Laterality Date    APPENDECTOMY      BREAST EXCISIONAL BIOPSY Right 11/28/2016    CHEMOTHERAPY      for ovarian CA in 2000    CHG US GUIDE, VASCULAR ACCESS N/A 01/29/2020    Procedure: ULTRASOUND GUIDANCE FOR VASC ACCESS REQUIRING [place] EVAL OF POTENTIAL ACCESS SITES;  Surgeon: Leona Carry, MD;  Location: MAIN OR [facility];  Service: Transplant    CHG US GUIDE, VASCULAR ACCESS N/A 06/08/2020    Procedure: ULTRASOUND GUIDANCE FOR VASC ACCESS REQUIRING [place] EVAL OF POTENTIAL ACCESS SITES;  Surgeon: Leona Carry, MD; Location: MAIN OR [facility];  Service: Transplant    CHOLECYSTECTOMY      COMBINED HYSTEROSCOPY DIAGNOSTIC / D&C  07/07/2015    Planned for endometrial ablation, but patient had uterine perforation after D&C and unable to perform    HYSTERECTOMY      LEFT OOPHORECTOMY Left 04/12/2000    Removed with L fallopian tube for ectopic pregnancy    OOPHORECTOMY      PR COLONOSCOPY W/BIOPSY SINGLE/MULTIPLE  09/12/2021    Procedure: COLONOSCOPY, FLEXIBLE, PROXIMAL TO SPLENIC FLEXURE; WITH BIOPSY, SINGLE OR MULTIPLE;  Surgeon: Maris Berger, MD;  [facility]: [facility];  Service: Gastroenterology    PR COLSC FLX W/RMVL OF TUMOR POLYP LESION SNARE TQ N/A 09/12/2021    Procedure: COLONOSCOPY FLEX; W/REMOV TUMOR/LES BY SNARE;  Surgeon: Maris Berger, MD;  Location: [facility];  Service: Gastroenterology    PR CREAT AV FISTULA,AUTOGENOUS GRAFT Left 01/29/2020    Procedure: Create Av Fistula (Separt Proc); Autog Gft;  Surgeon: Leona Carry, MD;  [facility];  Service: Transplant    PR CREAT AV FISTULA,NON-AUTOGENOUS GRAFT Left 06/08/2020    Procedure: CREATE AV FISTULA (SEPARATE PROC); NONAUTOGENOUS GRAFT (EG, BIOLOGICAL COLLAGEN, THERMOPLASTIC GRAFT);  Surgeon: Leona Carry, MD;  Location: MAIN OR [facility];  Service: Transplant    PR EXPLORATION N/FLWD SURG UPPER EXTREMITY ARTERY Left 08/02/2020    Procedure: Exploration Not Followed By Surgical Repair, Artery; Upper Extremity (Eg, Axillary, Brachial, Radial, Ulnar);  Surgeon: Earney Mallet, MD;  Location: MAIN  OR [facility];  Service: Vascular    PR INCIS/DRAIN ARM,DEEP ABSC/HEMATOMA Left 08/14/2020    Procedure: INCISION AND DRAINAGE, UPPER ARM OR ELBOW AREA; DEEP ABSCESS OR HEMATOMA;  Surgeon: Earney Mallet, MD;  Location: MAIN OR [place];  Service: Vascular    PR INSERTION TUNNEL INTRAPERITONEAL CATH DIAL OPEN Midline 02/01/2021    Procedure: INSERTION OF INTRAPERITONEAL CANNULA OR CATHETER FOR DRAINAGE OR DIALYSIS; PERMANENT;  Surgeon: Loney Hering, MD;  Location: MAIN OR [place];  Service: Transplant    PR REMV ART CLOT AXILL-BRACH,ARM INCIS Left 08/02/2020    Procedure: Embolect/Thrombec; Axilry/Brachial Art-Arm Incs;  Surgeon: Earney Mallet, MD;  Location: MAIN OR [facility];  Service: Vascular    PR UPPER GI ENDOSCOPY,BIOPSY N/A 08/29/2017    Procedure: UGI ENDOSCOPY; WITH BIOPSY, SINGLE OR MULTIPLE;  Surgeon: Neysa Hotter, MD;  Location: GI PROCEDURES [facility]  Service: Gastroenterology    PR VEIN BYPASS GRAFT,SUBCL-BRACHIAL Left 08/05/2020    Procedure: Bypass Graft, With Vein; Subclavian-Brachial;  Surgeon: Earney Mallet, MD;  Location: MAIN OR [facility];  Service: Vascular    SALPINGECTOMY Left 04/12/2000    Ectopic pregnancy    SALPINGECTOMY Right 11/22/2015    at time of total vaginal hysterectomy    TONSILECTOMY, ADENOIDECTOMY, BILATERAL MYRINGOTOMY AND TUBES      TOTAL VAGINAL HYSTERECTOMY  11/22/2015    With R salpingectomy       No current facility-administered medications for this encounter.    Current Outpatient Medications:     ADALIMUMAB PEN CITRATE FREE 40 MG/0.4 ML, Inject the contents of 1 pen (40 mg total) under the skin every fourteen (14) days., Disp: 6 each, Rfl: 3    adalimumab 80 mg/0.8 mL-40 mg/0.4 mL PnKt, Inject the contents of 1 pen (80mg ) under the skin for one dose, THEN 1 pen (40mg ) every 14 days thereafter., Disp: 3 each, Rfl: 0    allopurinoL (ZYLOPRIM) 100 MG tablet, Take 1 tablet (100 mg total) by mouth Every Tuesday, Thursday and Saturday., Disp: 36 tablet, Rfl: 3    amLODIPine (NORVASC) 10 MG tablet, Take 1 tablet (10 mg total) by mouth daily., Disp: 90 tablet, Rfl: 0    apixaban (ELIQUIS) 5 mg Tab, Take 1 tablet (5 mg total) by mouth in the morning., Disp: 30 tablet, Rfl: 0    aspirin 81 MG chewable tablet, Chew 1 tablet (81 mg total) daily., Disp: 30 tablet, Rfl: 1    atorvastatin (LIPITOR) 80 MG tablet, Take 1 tablet (80 mg total) by mouth daily., Disp: 30 tablet, Rfl: 0    beclomethasone dipropionate (QVAR REDIHALER) 80 mcg/actuation inhaler, 1 puff every morning, 2 puffs every evening (Patient taking differently: continuous as needed. 1 puff every morning, 2 puffs every evening as needed), Disp: 10.6 g, Rfl: 0    calcitriol (ROCALTROL) 0.5 MCG capsule, Take 2 capsules (1 mcg total) by mouth daily., Disp: 180 capsule, Rfl: 3    cinacalcet (SENSIPAR) 90 MG tablet, Take 1 tablet (90 mg total) by mouth daily., Disp: 90 tablet, Rfl: 3    evolocumab (REPATHA SURECLICK) 140 mg/mL PnIj, Inject the contents of 1 pen (140 mg) under the skin every fourteen (14) days., Disp: 6 mL, Rfl: 3    fluocinolone 0.01 % external oil, Apply topically daily as needed. Use for psoriasis and scalp scaling., Disp: 120 mL, Rfl: 5    furosemide (LASIX) 40 MG tablet, Take 1 tablet (40 mg total) by mouth 3 (three) times a week. To be taken once a day if  you develop leg swelling. (Patient not taking: Reported on 03/20/2022), Disp: 36 tablet, Rfl: 3    isosorbide mononitrate (IMDUR) 120 MG 24 hr tablet, Take 1 tablet (120 mg total) by mouth daily., Disp: 30 tablet, Rfl: 0    sevelamer (RENVELA) 800 mg tablet, Take 1 tablet (800 mg total) by mouth., Disp: , Rfl:     triamcinolone (KENALOG) 0.1 % ointment, Apply topically Two (2) times a day. To psoriasis spots. Stop when skin is clear, Disp: 454 g, Rfl: 2    Allergies  Cephalexin, Mango, Peach (prunus persica), Wellbutrin [bupropion hcl], Egg, Fish containing products, Lactase-rennet, Lexiscan [regadenoson], Mushroom, and Tomato (solanum lycopersicum)    Family History  Family History   Adopted: Yes   Problem Relation Age of Onset    Heart attack Mother 62    Alcohol abuse Mother     Mental illness Mother     No Known Problems Father     No Known Problems Sister     No Known Problems Daughter     No Known Problems Maternal Grandmother     No Known Problems Maternal Grandfather     No Known Problems Paternal Grandmother     No Known Problems Paternal Grandfather     No Known Problems Brother     No Known Problems Son     No Known Problems Maternal Aunt     No Known Problems Maternal Uncle     No Known Problems Paternal Aunt     No Known Problems Paternal Uncle     No Known Problems Other     Anesthesia problems Neg Hx     Broken bones Neg Hx     Cancer Neg Hx     Clotting disorder Neg Hx     Collagen disease Neg Hx     Diabetes Neg Hx     Dislocations Neg Hx     Fibromyalgia Neg Hx     Gout Neg Hx     Hemophilia Neg Hx     Osteoporosis Neg Hx     Rheumatologic disease Neg Hx     Scoliosis Neg Hx     Severe sprains Neg Hx     Sickle cell anemia Neg Hx     Spinal Compression Fracture Neg Hx     Melanoma Neg Hx     Basal cell carcinoma Neg Hx     Squamous cell carcinoma Neg Hx     Breast cancer Neg Hx        Social History  Social History     Tobacco Use    Smoking status: Former     Packs/day: 2.00     Years: 1.00     Additional pack years: 0.00     Total pack years: 2.00     Types: Cigarettes     Quit date: 06/30/2010     Years since quitting: 12.2    Smokeless tobacco: Never   Vaping Use    Vaping Use: Never used   Substance Use Topics    Alcohol use: Not Currently    Drug use: Not Currently        Physical Exam     VITAL SIGNS:      Vitals:    09/15/22 1100 09/15/22 1625 09/15/22 1700 09/15/22 1800   BP: 116/70 116/95 128/100 131/76   Pulse: 109 125 101 97   Resp: 26 29 29 20    Temp:  36.7 ??C (98 ??F)  36.7 ??C (  98 ??F)   TempSrc:  Oral  Oral   SpO2:  98%  98%       Constitutional: Alert and oriented. No acute distress.  Ill-appearing.  Eyes: Conjunctivae are normal.  HEENT: Normocephalic and atraumatic. Conjunctivae clear. No congestion. Moist mucous membranes.   Cardiovascular: Rate as above, regular rhythm. Normal and symmetric distal pulses. Brisk capillary refill. Normal skin turgor.  Respiratory: Normal respiratory effort. Breath sounds are normal. There are no wheezing or crackles heard.  Gastrointestinal: Soft, non-distended, non-tender.  Peritoneal dialysis port with no discharge or surrounding erythema.  Genitourinary: Deferred.  Musculoskeletal: Non-tender with normal range of motion in all extremities.  Neurologic: Normal speech and language.  Localized weakness but no gross focal neurologic deficits are appreciated. Patient is moving all extremities equally, face is symmetric at rest and with speech.  Skin: Skin is warm, dry and intact. No rash noted.  Psychiatric: Mood and affect are normal. Speech and behavior are normal.     Radiology     CTA Chest W Contrast   Final Result      1.No evidence of pulmonary embolism.   2.Few small regions of peripheral groundglass opacity in the right lung, likely reflecting atelectasis and/or sequela of viral infection.      XR Chest Portable   Final Result      No acute abnormalities.          Pertinent labs & imaging results that were available during my care of the patient were independently interpreted by me and considered in my medical decision making (see chart for details).    Portions of this record have been created using Scientist, clinical (histocompatibility and immunogenetics). Dictation errors have been sought, but may not have been identified and corrected.         Karie Mainland, MD  Resident  09/15/22 [phone]

## 2022-09-15 NOTE — Unmapped (Signed)
Pt brought in by [facility].  C/O weakness.  Had Covid late October.  + cough.  + nausea.  Given Zofran by EMS.

## 2022-09-15 NOTE — Unmapped (Signed)
Bed: 14-A  Expected date:   Expected time:   Means of arrival:   Comments:  EMS

## 2022-09-16 LAB — BASIC METABOLIC PANEL
ANION GAP: 13 mmol/L (ref 5–14)
BLOOD UREA NITROGEN: 58 mg/dL — ABNORMAL HIGH (ref 9–23)
BUN / CREAT RATIO: 4
CALCIUM: 7.8 mg/dL — ABNORMAL LOW (ref 8.7–10.4)
CHLORIDE: 102 mmol/L (ref 98–107)
CO2: 25 mmol/L (ref 20.0–31.0)
CREATININE: 14.63 mg/dL — ABNORMAL HIGH
EGFR CKD-EPI (2021) FEMALE: 3 mL/min/{1.73_m2} — ABNORMAL LOW (ref >=60)
GLUCOSE RANDOM: 100 mg/dL (ref 70–179)
POTASSIUM: 3.1 mmol/L — ABNORMAL LOW (ref 3.4–4.8)
SODIUM: 140 mmol/L (ref 135–145)

## 2022-09-16 LAB — IRON PANEL
IRON SATURATION: 59 % — ABNORMAL HIGH (ref 20–55)
IRON: 93 ug/dL
TOTAL IRON BINDING CAPACITY: 157 ug/dL — ABNORMAL LOW (ref 250–425)

## 2022-09-16 LAB — POTASSIUM
POTASSIUM: 2.9 mmol/L — ABNORMAL LOW (ref 3.4–4.8)
POTASSIUM: 3 mmol/L — ABNORMAL LOW (ref 3.4–4.8)

## 2022-09-16 LAB — CBC
HEMATOCRIT: 35.4 % (ref 34.0–44.0)
HEMOGLOBIN: 11.8 g/dL (ref 11.3–14.9)
MEAN CORPUSCULAR HEMOGLOBIN CONC: 33.3 g/dL (ref 32.0–36.0)
MEAN CORPUSCULAR HEMOGLOBIN: 29.7 pg (ref 25.9–32.4)
MEAN CORPUSCULAR VOLUME: 89.2 fL (ref 77.6–95.7)
MEAN PLATELET VOLUME: 7.5 fL (ref 6.8–10.7)
PLATELET COUNT: 208 10*9/L (ref 150–450)
RED BLOOD CELL COUNT: 3.97 10*12/L (ref 3.95–5.13)
RED CELL DISTRIBUTION WIDTH: 13.6 % (ref 12.2–15.2)
WBC ADJUSTED: 11.8 10*9/L — ABNORMAL HIGH (ref 3.6–11.2)

## 2022-09-16 LAB — LACTATE SEPSIS, VENOUS 3: LACTATE BLOOD VENOUS: 2.1 mmol/L (ref 0.5–1.8)

## 2022-09-16 LAB — FERRITIN: FERRITIN: 570 ng/mL — ABNORMAL HIGH

## 2022-09-16 LAB — HEMOGLOBIN AND HEMATOCRIT, BLOOD
HEMATOCRIT: 34.5 % (ref 34.0–44.0)
HEMOGLOBIN: 11.4 g/dL (ref 11.3–14.9)

## 2022-09-16 LAB — VANCOMYCIN, RANDOM: VANCOMYCIN RANDOM: 27 ug/mL

## 2022-09-16 LAB — PHOSPHORUS: PHOSPHORUS: 7.7 mg/dL — ABNORMAL HIGH (ref 2.4–5.1)

## 2022-09-16 LAB — LACTATE, VENOUS, WHOLE BLOOD: LACTATE BLOOD VENOUS: 1.4 mmol/L (ref 0.5–1.8)

## 2022-09-16 LAB — MAGNESIUM: MAGNESIUM: 1.5 mg/dL — ABNORMAL LOW (ref 1.6–2.6)

## 2022-09-16 MED ADMIN — fluticasone furoate (ARNUITY ELLIPTA) 100 mcg/actuation inhaler 1 puff: 1 | RESPIRATORY_TRACT | @ 15:00:00

## 2022-09-16 MED ADMIN — guaiFENesin (ROBITUSSIN) oral syrup: 200 mg | ORAL | @ 10:00:00

## 2022-09-16 MED ADMIN — ondansetron (ZOFRAN) injection 4 mg: 4 mg | INTRAVENOUS | @ 09:00:00 | Stop: 2022-09-16

## 2022-09-16 MED ADMIN — sevelamer (RENVELA) tablet 800 mg: 800 mg | ORAL | @ 18:00:00

## 2022-09-16 MED ADMIN — potassium chloride ER tablet 40 mEq: 40 meq | ORAL | @ 08:00:00 | Stop: 2022-09-16

## 2022-09-16 MED ADMIN — pantoprazole (Protonix) injection 40 mg: 40 mg | INTRAVENOUS | @ 08:00:00

## 2022-09-16 MED ADMIN — potassium chloride ER tablet 20 mEq: 20 meq | ORAL | @ 18:00:00 | Stop: 2022-09-16

## 2022-09-16 MED ADMIN — ondansetron (ZOFRAN) injection 4 mg: 4 mg | INTRAVENOUS | @ 15:00:00 | Stop: 2022-09-16

## 2022-09-16 MED ADMIN — acetaminophen (TYLENOL) tablet 650 mg: 650 mg | ORAL | @ 10:00:00

## 2022-09-16 MED ADMIN — ondansetron (ZOFRAN) injection 4 mg: 4 mg | INTRAVENOUS | @ 21:00:00 | Stop: 2022-09-18

## 2022-09-16 MED ADMIN — pantoprazole (Protonix) injection 40 mg: 40 mg | INTRAVENOUS | @ 15:00:00

## 2022-09-16 NOTE — Unmapped (Signed)
Nephrology (MEDB) History & Physical    Assessment & Plan:   Megan Robertson is a 51 y.o. 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     Principal Problem:    Sepsis (CMS-HCC)  Active Problems:    Gout    GERD (gastroesophageal reflux disease)    Hypertension    OSA (obstructive sleep apnea)    Hypokalemia    Tachycardia    Anemia in chronic kidney disease    ESRD (end stage renal disease) on dialysis (CMS-HCC)    High serum lactate    Melena    Coffee ground emesis    Nausea    Headache    Blurry vision  Resolved Problems:    * No resolved hospital problems. *    Active Problems    Sepsis of Unknown Origin - Muscles Aches - Nausea/Vomiting - Poor PO Intake  Symptoms could potentially represent continued sequela of COVID. No localizing symptoms on my exam. No abdominal distension or tenderness. Will send peritoneal fluid cell count to eval for possible peritonitis. Her chest CT does show GGOs. She has a leukocytosis and is tachycardic. Unclear source of sepsis at this point. Will continue to treat broadly with antibiotics.   - PD fluid cell count and culture  - F/up BCx2   - Continue cefepime/vanc     Headaches - Blurry Vision  Blurry vision only occurred in context of headaches. Does have a history of migraines. May be having headaches in the context of illness as above.   - Continue monitoring for symptoms   - Symptomatic treatment     Reported Melena & Coffee Ground Emesis - AoCKD  Occurred about two days prior to admission. Hgb stable on admission here, no signs of active bleeding and HDS. She had a colonoscopy in 2022 which found a polyp in cecum, two sessile polyps, and internal hemorrhoids. Two polyps were adenomatous and other was hyperplastic. She does have a history of ovarian cancer.  - NPO  - Trend hemoglobin  - IV protonix 40mg  BID, can likely discontinue/de-escalate to PO if no further episodes   - Iron panel   - Ferritin     ESRD on PD  Deferred initiating PD overnight as no indications for acute dialysis. Resume normal schedule today.   - Nightly PD   - Daily BMP  - Continue sevelamer 800mg  TID   - Continue calcitriol daily   - Continue cinacalcet 90mg  daily     Chronic Problems    Psoriasis: On Humira, follows with [facility]. Holding humira.  HTN: Continue amlodipine, imdur   Hx Gout: Continue allopurinol       The patient's presentation is complicated by the following clinically significant conditions requiring additional evaluation and treatment: - Chronic kidney disease POA requiring further investigation, treatment, or monitoring   - Hypokalemia POA requiring further investigation, treatment, or monitoring  - Volume depletion POA requiring further investigation, treatment, or monitoring     Checklist:  Diet: Regular Diet  DVT PPx: Contraindicated 2/2 reported melena/coffee ground emesis  Code Status: Full Code  Dispo: Patient appropriate for Observation based on expectation of ongoing need for hospitalization less than two midnights and/or low intensity of services provided    Team Contact Information:   Primary Team:  Nephrology (MEDB)  Primary Resident: Achille Rich, MD  Resident's Pager: [phone] (Nephrology Senior Resident)    Chief Concern:   Sepsis (CMS-HCC)    Subjective:   Megan Robertson is a 51 y.o. 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     History obtained by Hetty Blend, MD from patient.     HPI:      She initially presented to the ED on 11/1 with fever and was found to have COVID. She again presented on 11/16 with continuing nausea, vomiting, diarrhea, body aches.  At that time, ED team had attempted to obtain a peritoneal fluid sample but there was no fluid present in the peritoneum and so she was discharged from the ED.     She notes persistent symptoms since last ED visit that have progressed to the point of bringing her to the ED. She is having chills, headaches with blurry vision, nausea/vomiting, muscle aches, poor PO intake (no appetite). She notes pain in the epigastric region which improves with sitting forward.    On arrival to the ED, she was afebrile, tachycardic to 110s, normotensive, and satting well on room air. Labs significant for WBC 14.1, K 3.0, and lactate 2.1. D dimer elevated at 1234. RPP negative, including for COVID. She was given 500cc LR, vanc, and cefepime. CTA chest negative for PE, revealed peripheral GGOs likely reflecting atelectasis and/or sequela of viral infection.      Pertinent Surgical Hx  Past Surgical History:   Procedure Laterality Date    APPENDECTOMY      BREAST EXCISIONAL BIOPSY Right 11/28/2016    CHEMOTHERAPY      for ovarian CA in 2000    CHG US GUIDE, VASCULAR ACCESS N/A 01/29/2020    Procedure: ULTRASOUND GUIDANCE FOR VASC ACCESS REQUIRING [place] EVAL OF POTENTIAL ACCESS SITES;  Surgeon: Leona Carry, MD;  Location: MAIN OR [facility];  Service: Transplant    CHG US GUIDE, VASCULAR ACCESS N/A 06/08/2020    Procedure: ULTRASOUND GUIDANCE FOR VASC ACCESS REQUIRING [place] EVAL OF POTENTIAL ACCESS SITES;  Surgeon: Leona Carry, MD;  Location: MAIN OR [facility];  Service: Transplant    CHOLECYSTECTOMY      COMBINED HYSTEROSCOPY DIAGNOSTIC / D&C  07/07/2015    Planned for endometrial ablation, but patient had uterine perforation after D&C and unable to perform    HYSTERECTOMY      LEFT OOPHORECTOMY Left 04/12/2000    Removed with L fallopian tube for ectopic pregnancy    OOPHORECTOMY      PR COLONOSCOPY W/BIOPSY SINGLE/MULTIPLE  09/12/2021    Procedure: COLONOSCOPY, FLEXIBLE, PROXIMAL TO SPLENIC FLEXURE; WITH BIOPSY, SINGLE OR MULTIPLE;  Surgeon: Maris Berger, MD;  Location: [facility]URES [facility];  Service: Gastroenterology    PR COLSC FLX W/RMVL OF TUMOR POLYP LESION SNARE TQ N/A 09/12/2021    Procedure: COLONOSCOPY FLEX; W/REMOV TUMOR/LES BY SNARE;  Surgeon: Maris Berger, MD;  Location: GI PROCEDURES [facility];  Service: Gastroenterology    PR CREAT AV FISTULA,AUTOGENOUS GRAFT Left 01/29/2020    Procedure: Create Av Fistula (Separt Proc); Autog Gft;  Surgeon: Leona Carry, MD;  Location: MAIN OR [facility];  Service: Transplant    PR CREAT AV FISTULA,NON-AUTOGENOUS GRAFT Left 06/08/2020    Procedure: CREATE AV FISTULA (SEPARATE PROC);  NONAUTOGENOUS GRAFT (EG, BIOLOGICAL COLLAGEN, THERMOPLASTIC GRAFT);  Surgeon: Leona Carry, MD;  Location: MAIN OR [facility];  Service: Transplant    PR EXPLORATION N/FLWD SURG UPPER EXTREMITY ARTERY Left 08/02/2020    Procedure: Exploration Not Followed By Surgical Repair, Artery; Upper Extremity (Eg, Axillary, Brachial, Radial, Ulnar);  Surgeon: Earney Mallet, MD;  Location: MAIN OR [facility];  Service: Vascular    PR INCIS/DRAIN ARM,DEEP ABSC/HEMATOMA Left 08/14/2020    Procedure: INCISION AND DRAINAGE, UPPER ARM OR ELBOW AREA; DEEP ABSCESS OR HEMATOMA;  Surgeon: Earney Mallet, MD;  Location: MAIN OR [place];  Service: Vascular    PR INSERTION TUNNEL INTRAPERITONEAL CATH DIAL OPEN Midline 02/01/2021    Procedure: INSERTION OF INTRAPERITONEAL CANNULA OR CATHETER FOR DRAINAGE OR DIALYSIS; PERMANENT;  Surgeon: Loney Hering, MD;  Location: MAIN OR [place];  Service: Transplant    PR REMV ART CLOT AXILL-BRACH,ARM INCIS Left 08/02/2020    Procedure: Embolect/Thrombec; Axilry/Brachial Art-Arm Incs;  Surgeon: Earney Mallet, MD;  Location: MAIN OR [facility];  Service: Vascular    PR UPPER GI ENDOSCOPY,BIOPSY N/A 08/29/2017    Procedure: UGI ENDOSCOPY; WITH BIOPSY, SINGLE OR MULTIPLE;  Surgeon: Neysa Hotter, MD; Location: GI PROCEDURES [facility] [facility];  Service: Gastroenterology    PR VEIN BYPASS GRAFT,SUBCL-BRACHIAL Left 08/05/2020    Procedure: Bypass Graft, With Vein; Subclavian-Brachial;  Surgeon: Earney Mallet, MD;  Location: MAIN OR [facility];  Service: Vascular    SALPINGECTOMY Left 04/12/2000    Ectopic pregnancy    SALPINGECTOMY Right 11/22/2015    at time of total vaginal hysterectomy    TONSILECTOMY, ADENOIDECTOMY, BILATERAL MYRINGOTOMY AND TUBES      TOTAL VAGINAL HYSTERECTOMY  11/22/2015    With R salpingectomy         Pertinent Family Hx  Family History   Adopted: Yes   Problem Relation Age of Onset    Heart attack Mother 22    Alcohol abuse Mother     Mental illness Mother     No Known Problems Father     No Known Problems Sister     No Known Problems Daughter     No Known Problems Maternal Grandmother     No Known Problems Maternal Grandfather     No Known Problems Paternal Grandmother     No Known Problems Paternal Grandfather     No Known Problems Brother     No Known Problems Son     No Known Problems Maternal Aunt     No Known Problems Maternal Uncle     No Known Problems Paternal Aunt     No Known Problems Paternal Uncle     No Known Problems Other     Anesthesia problems Neg Hx     Broken bones Neg Hx     Cancer Neg Hx     Clotting disorder Neg Hx     Collagen disease Neg Hx     Diabetes Neg Hx     Dislocations Neg Hx     Fibromyalgia Neg Hx     Gout Neg Hx     Hemophilia Neg Hx     Osteoporosis Neg Hx     Rheumatologic disease Neg Hx     Scoliosis Neg Hx     Severe sprains Neg Hx     Sickle cell anemia Neg Hx     Spinal Compression Fracture Neg Hx     Melanoma Neg Hx     Basal cell carcinoma Neg Hx  Squamous cell carcinoma Neg Hx     Breast cancer Neg Hx          Pertinent Social Hx   Tobacco Use: Medium Risk (09/06/2022)    Patient History     Smoking Tobacco Use: Former     Smokeless Tobacco Use: Never     Passive Exposure: Not on file     Social History     Substance and Sexual Activity Alcohol Use Not Currently         Allergies  Cephalexin, Mango, Peach (prunus persica), Wellbutrin [bupropion hcl], Egg, Fish containing products, Lactase-rennet, Lexiscan [regadenoson], Mushroom, and Tomato (solanum lycopersicum)    I reviewed the Medication List. The current list is Accurate  Prior to Admission medications    Medication Dose, Route, Frequency   ADALIMUMAB PEN CITRATE FREE 40 MG/0.4 ML Inject the contents of 1 pen (40 mg total) under the skin every fourteen (14) days.   adalimumab 80 mg/0.8 mL-40 mg/0.4 mL PnKt Inject the contents of 1 pen (80mg ) under the skin for one dose, THEN 1 pen (40mg ) every 14 days thereafter.   allopurinoL (ZYLOPRIM) 100 MG tablet 100 mg, Oral, Tue-Thur-Sat   amLODIPine (NORVASC) 10 MG tablet 10 mg, Oral, Daily (standard)   apixaban (ELIQUIS) 5 mg Tab 5 mg, Oral, Daily   aspirin 81 MG chewable tablet 81 mg, Oral, Daily (standard)   atorvastatin (LIPITOR) 80 MG tablet 80 mg, Oral, Daily (standard)   beclomethasone dipropionate (QVAR REDIHALER) 80 mcg/actuation inhaler 1 puff every morning, 2 puffs every evening  Patient taking differently: continuous as needed. 1 puff every morning, 2 puffs every evening as needed   calcitriol (ROCALTROL) 0.5 MCG capsule 1 mcg, Oral, Daily (standard)   cinacalcet (SENSIPAR) 90 MG tablet 90 mg, Oral, Daily (standard)   evolocumab (REPATHA SURECLICK) 140 mg/mL PnIj Inject the contents of 1 pen (140 mg) under the skin every fourteen (14) days.   fluocinolone 0.01 % external oil Topical, Daily PRN, Use for psoriasis and scalp scaling.   furosemide (LASIX) 40 MG tablet 40 mg, Oral, 3 times weekly, To be taken once a day if you develop leg swelling.  Patient not taking: Reported on 03/20/2022   isosorbide mononitrate (IMDUR) 120 MG 24 hr tablet 120 mg, Oral, Daily (standard)   sevelamer (RENVELA) 800 mg tablet 1 tablet, Oral   triamcinolone (KENALOG) 0.1 % ointment Topical, 2 times a day (standard), To psoriasis spots. Stop when skin is clear Public relations account executive Maker:  Ms. Caputi currently has decisional capacity for healthcare decision-making and is able to designate a surrogate healthcare decision maker. Ms. Policastro designated healthcare decision maker(s) is/are Gerilyn Linley (the patient's spouse) as denoted by stated patient preference.    Objective:   Physical Exam:  Temp:  [36.4 ??C (97.6 ??F)-37.1 ??C (98.8 ??F)] 36.8 ??C (98.2 ??F)  Heart Rate:  [97-125] 118  SpO2 Pulse:  [101-117] 101  Resp:  [18-49] 18  BP: (107-131)/(67-100) 107/71  SpO2:  [96 %-100 %] 96 %    Gen: NAD, converses, appears lethargic   Eyes: Sclera anicteric  HENT: Atraumatic, normocephalic  Heart: Tachycardic, regular rhythm   Lungs: CTAB, no crackles or wheezes  Abdomen: Soft, NTND. PD catheter site without erythema or drainage   Extremities: No edema  Neuro: Grossly symmetric, non-focal    Skin:  No rashes, lesions on clothed exam  Psych: Alert, oriented

## 2022-09-16 NOTE — Unmapped (Signed)
Tele unable to provide pulse ox. No tele boxes available, pr is on the list.

## 2022-09-16 NOTE — Unmapped (Signed)
Problem: Adult Inpatient Plan of Care  Goal: Plan of Care Review  Outcome: Ongoing - Unchanged  Flowsheets (Taken 09/16/2022 0153)  Plan of Care Reviewed With: patient  Goal: Patient-Specific Goal (Individualized)  Outcome: Ongoing - Unchanged  Goal: Absence of Hospital-Acquired Illness or Injury  Outcome: Ongoing - Unchanged  Intervention: Identify and Manage Fall Risk  Recent Flowsheet Documentation  Taken 09/16/2022 0000 by Claud Kelp, RN  Safety Interventions: environmental modification  Intervention: Prevent Skin Injury  Recent Flowsheet Documentation  Taken 09/16/2022 0000 by Claud Kelp, RN  Positioning for Skin: Supine/Back  Skin Protection: adhesive use limited  Intervention: Prevent Infection  Recent Flowsheet Documentation  Taken 09/16/2022 0000 by Claud Kelp, RN  Infection Prevention:   environmental surveillance performed   rest/sleep promoted  Goal: Optimal Comfort and Wellbeing  Outcome: Ongoing - Unchanged  Goal: Readiness for Transition of Care  Outcome: Ongoing - Unchanged  Goal: Rounds/Family Conference  Outcome: Ongoing - Unchanged

## 2022-09-16 NOTE — Unmapped (Signed)
Vancomycin Therapeutic Monitoring Pharmacy Note    Megan Robertson is a 51 y.o. female continuing vancomycin. Date of therapy initiation: 09/15/22    Indication: Bacteremia/Sepsis    Prior Dosing Information:  S/p vancomycin 1500 mg IV x1 on 11/25      Goals:  Therapeutic Drug Levels  Vancomycin trough goal: 10-15 mg/L    Additional Clinical Monitoring/Outcomes  Renal function, volume status (intake and output)    Results: Vancomycin level 27 mg/L, drawn appropriately - random level    Wt Readings from Last 1 Encounters:   09/06/22 82.2 kg (181 lb 5.3 oz)     Creatinine   Date Value Ref Range Status   09/16/2022 14.63 (H) 0.55 - 1.02 mg/dL Final   09/81/1914 78.29 (H) 0.55 - 1.02 mg/dL Final   56/21/3086 57.84 (H) 0.55 - 1.02 mg/dL Final        Pharmacokinetic Considerations and Significant Drug Interactions:  PD  Concurrent nephrotoxic meds: not applicable    Assessment/Plan:  Recommendation(s)  Hold further vancomycin dosing until level < 15 mg/L  Estimated trough on recommended regimen: Not applicable - dosing by level    Follow-up  Level due:  09/18/22 with AM labs  A pharmacist will continue to monitor and order levels as appropriate    Please page service pharmacist with questions/clarifications.    Crista Curb, PharmD

## 2022-09-16 NOTE — Unmapped (Signed)
Problem: Adult Inpatient Plan of Care  Goal: Plan of Care Review  09/16/2022 0618 by Claud Kelp, RN  Outcome: Ongoing - Unchanged  Flowsheets (Taken 09/16/2022 0618)  Plan of Care Reviewed With: patient  09/16/2022 0153 by Claud Kelp, RN  Outcome: Ongoing - Unchanged  Flowsheets (Taken 09/16/2022 0153)  Plan of Care Reviewed With: patient  Goal: Patient-Specific Goal (Individualized)  09/16/2022 0618 by Claud Kelp, RN  Outcome: Ongoing - Unchanged  09/16/2022 0153 by Claud Kelp, RN  Outcome: Ongoing - Unchanged  Goal: Absence of Hospital-Acquired Illness or Injury  09/16/2022 0618 by Claud Kelp, RN  Outcome: Ongoing - Unchanged  09/16/2022 0153 by Claud Kelp, RN  Outcome: Ongoing - Unchanged  Intervention: Identify and Manage Fall Risk  Recent Flowsheet Documentation  Taken 09/16/2022 0600 by Claud Kelp, RN  Safety Interventions: environmental modification  Taken 09/16/2022 0200 by Claud Kelp, RN  Safety Interventions:   elopement precautions   environmental modification  Taken 09/16/2022 0000 by Claud Kelp, RN  Safety Interventions: environmental modification  Intervention: Prevent Skin Injury  Recent Flowsheet Documentation  Taken 09/16/2022 0600 by Claud Kelp, RN  Positioning for Skin: Supine/Back  Taken 09/16/2022 0400 by Claud Kelp, RN  Positioning for Skin: Supine/Back  Taken 09/16/2022 0200 by Claud Kelp, RN  Positioning for Skin: Supine/Back  Skin Protection: adhesive use limited  Taken 09/16/2022 0000 by Claud Kelp, RN  Positioning for Skin: Supine/Back  Skin Protection: adhesive use limited  Intervention: Prevent and Manage VTE (Venous Thromboembolism) Risk  Recent Flowsheet Documentation  Taken 09/16/2022 0000 by Claud Kelp, RN  VTE Prevention/Management: ambulation promoted  Intervention: Prevent Infection  Recent Flowsheet Documentation  Taken 09/16/2022 0600 by Claud Kelp, RN  Infection Prevention: rest/sleep promoted  Taken 09/16/2022 0200 by Claud Kelp, RN  Infection Prevention: rest/sleep promoted  Taken 09/16/2022 0000 by Claud Kelp, RN  Infection Prevention:   environmental surveillance performed   rest/sleep promoted  Goal: Optimal Comfort and Wellbeing  09/16/2022 0618 by Claud Kelp, RN  Outcome: Ongoing - Unchanged  09/16/2022 0153 by Claud Kelp, RN  Outcome: Ongoing - Unchanged  Goal: Readiness for Transition of Care  09/16/2022 0618 by Claud Kelp, RN  Outcome: Ongoing - Unchanged  09/16/2022 0153 by Claud Kelp, RN  Outcome: Ongoing - Unchanged  Goal: Rounds/Family Conference  09/16/2022 0618 by Claud Kelp, RN  Outcome: Ongoing - Unchanged  09/16/2022 0153 by Claud Kelp, RN  Outcome: Ongoing - Unchanged     Problem: Wound  Goal: Optimal Coping  Outcome: Ongoing - Unchanged  Goal: Optimal Functional Ability  Outcome: Ongoing - Unchanged  Goal: Absence of Infection Signs and Symptoms  Outcome: Ongoing - Unchanged  Intervention: Prevent or Manage Infection  Recent Flowsheet Documentation  Taken 09/16/2022 0600 by Claud Kelp, RN  Infection Management: aseptic technique maintained  Taken 09/16/2022 0200 by Claud Kelp, RN  Infection Management: aseptic technique maintained  Taken 09/16/2022 0000 by Claud Kelp, RN  Infection Management: aseptic technique maintained  Goal: Improved Oral Intake  Outcome: Ongoing - Unchanged  Goal: Optimal Pain Control and Function  Outcome: Ongoing - Unchanged  Goal: Skin Health and Integrity  Outcome: Ongoing - Unchanged  Intervention: Optimize Skin Protection  Recent Flowsheet Documentation  Taken 09/16/2022 0600 by Claud Kelp, RN  Pressure Reduction Devices: alternating pressure pump (APP)  Taken 09/16/2022 0400 by Pamala Hurry,  Crosby Bevan L, RN  Pressure Reduction Devices: alternating pressure pump (APP)  Taken 09/16/2022 0200 by Claud Kelp, RN  Pressure Reduction Devices: alternating pressure pump (APP)  Skin Protection: adhesive use limited  Taken 09/16/2022 0000 by Claud Kelp, RN  Pressure Reduction Devices:   alternating pressure pump (APP)   positioning supports utilized  Skin Protection: adhesive use limited  Goal: Optimal Wound Healing  Outcome: Ongoing - Unchanged

## 2022-09-16 NOTE — Unmapped (Signed)
Critical Response Nurse Consult Note    Critical Response Nurse consult was ordered for Difficult Lab Draw    [facility]    The patient???s primary language is [place].    Interpreter services were N/A    The consult was Not triggered by DI      The patient???s DI score at time of consult was 21-30    Interventions included:   Peripheral stick attempt x1 for venous lactate. Patient tolerated procedure well.     The time frame for follow-up reassessment No follow-up is needed at this time    Primary nurse was educated to submit page at the above time frame established, but if patient exhibits any acute deviations from baseline and or have signs and symptoms of patient deterioration, the recommendation is to activate the rapid response team.    Thank you for your consult,    Claudette Head RN

## 2022-09-17 LAB — CBC
HEMATOCRIT: 33.7 % — ABNORMAL LOW (ref 34.0–44.0)
HEMOGLOBIN: 11.1 g/dL — ABNORMAL LOW (ref 11.3–14.9)
MEAN CORPUSCULAR HEMOGLOBIN CONC: 33 g/dL (ref 32.0–36.0)
MEAN CORPUSCULAR HEMOGLOBIN: 29.5 pg (ref 25.9–32.4)
MEAN CORPUSCULAR VOLUME: 89.3 fL (ref 77.6–95.7)
MEAN PLATELET VOLUME: 7.3 fL (ref 6.8–10.7)
PLATELET COUNT: 183 10*9/L (ref 150–450)
RED BLOOD CELL COUNT: 3.77 10*12/L — ABNORMAL LOW (ref 3.95–5.13)
RED CELL DISTRIBUTION WIDTH: 13.1 % (ref 12.2–15.2)
WBC ADJUSTED: 8.7 10*9/L (ref 3.6–11.2)

## 2022-09-17 LAB — PHOSPHORUS: PHOSPHORUS: 6.1 mg/dL — ABNORMAL HIGH (ref 2.4–5.1)

## 2022-09-17 LAB — BASIC METABOLIC PANEL
ANION GAP: 14 mmol/L (ref 5–14)
ANION GAP: 14 mmol/L (ref 5–14)
BLOOD UREA NITROGEN: 43 mg/dL — ABNORMAL HIGH (ref 9–23)
BLOOD UREA NITROGEN: 50 mg/dL — ABNORMAL HIGH (ref 9–23)
BUN / CREAT RATIO: 4
BUN / CREAT RATIO: 4
CALCIUM: 7 mg/dL — ABNORMAL LOW (ref 8.7–10.4)
CALCIUM: 7.6 mg/dL — ABNORMAL LOW (ref 8.7–10.4)
CHLORIDE: 103 mmol/L (ref 98–107)
CHLORIDE: 105 mmol/L (ref 98–107)
CO2: 22 mmol/L (ref 20.0–31.0)
CO2: 22 mmol/L (ref 20.0–31.0)
CREATININE: 11.94 mg/dL — ABNORMAL HIGH
CREATININE: 13.14 mg/dL — ABNORMAL HIGH
EGFR CKD-EPI (2021) FEMALE: 3 mL/min/{1.73_m2} — ABNORMAL LOW (ref >=60)
EGFR CKD-EPI (2021) FEMALE: 3 mL/min/{1.73_m2} — ABNORMAL LOW (ref >=60)
GLUCOSE RANDOM: 89 mg/dL (ref 70–179)
GLUCOSE RANDOM: 87 mg/dL (ref 70–179)
POTASSIUM: 3.1 mmol/L — ABNORMAL LOW (ref 3.4–4.8)
POTASSIUM: 3 mmol/L — ABNORMAL LOW (ref 3.4–4.8)
SODIUM: 139 mmol/L (ref 135–145)
SODIUM: 141 mmol/L (ref 135–145)

## 2022-09-17 LAB — MAGNESIUM: MAGNESIUM: 1.4 mg/dL — ABNORMAL LOW (ref 1.6–2.6)

## 2022-09-17 MED ADMIN — ondansetron (ZOFRAN) injection 4 mg: 4 mg | INTRAVENOUS | @ 05:00:00 | Stop: 2022-09-18

## 2022-09-17 MED ADMIN — lactated Ringers infusion: 100 mL/h | INTRAVENOUS | @ 14:00:00 | Stop: 2022-09-17

## 2022-09-17 MED ADMIN — dianeal low CA - dextrose 1.5% and calcium 2.5 2,000 mL infusion: INTRAPERITONEAL | @ 01:00:00

## 2022-09-17 MED ADMIN — pantoprazole (Protonix) injection 40 mg: 40 mg | INTRAVENOUS | @ 04:00:00

## 2022-09-17 MED ADMIN — sevelamer (RENVELA) tablet 800 mg: 800 mg | ORAL | @ 23:00:00

## 2022-09-17 MED ADMIN — sevelamer (RENVELA) tablet 800 mg: 800 mg | ORAL | @ 19:00:00

## 2022-09-17 MED ADMIN — cefepime (MAXIPIME) 1 g in sodium chloride 0.9 % (NS) 100 mL IVPB-MBP: 1 g | INTRAVENOUS | @ 05:00:00 | Stop: 2022-09-22

## 2022-09-17 MED ADMIN — dianeal low CA - dextrose 1.5% and calcium 2.5 5,000 mL infusion: INTRAPERITONEAL | @ 01:00:00

## 2022-09-17 MED ADMIN — calcium gluc in NaCl, iso-osm 1 gram/50 mL IVPB 1 g: 1 g | INTRAVENOUS | @ 22:00:00 | Stop: 2022-09-17

## 2022-09-17 MED ADMIN — potassium chloride ER tablet 40 mEq: 40 meq | ORAL | @ 09:00:00 | Stop: 2022-09-17

## 2022-09-17 MED ADMIN — lactated Ringers infusion: 100 mL/h | INTRAVENOUS | @ 23:00:00 | Stop: 2022-09-17

## 2022-09-17 MED ADMIN — fluticasone furoate (ARNUITY ELLIPTA) 100 mcg/actuation inhaler 1 puff: 1 | RESPIRATORY_TRACT | @ 14:00:00

## 2022-09-17 MED ADMIN — lactated ringers bolus 500 mL: 500 mL | INTRAVENOUS | @ 05:00:00

## 2022-09-17 MED ADMIN — ondansetron (ZOFRAN) injection 4 mg: 4 mg | INTRAVENOUS | @ 11:00:00 | Stop: 2022-09-18

## 2022-09-17 MED ADMIN — lactated ringers bolus 500 mL: 500 mL | INTRAVENOUS | @ 06:00:00 | Stop: 2022-09-17

## 2022-09-17 MED ADMIN — potassium chloride (KLOR-CON) packet 40 mEq: 40 meq | ORAL | @ 14:00:00 | Stop: 2022-09-17

## 2022-09-17 MED ADMIN — pantoprazole (Protonix) injection 40 mg: 40 mg | INTRAVENOUS | @ 14:00:00 | Stop: 2022-09-17

## 2022-09-17 NOTE — Unmapped (Addendum)
New patient [facility] with nausea, vomiting, fever, chills. Alert & oriented. On RA. On airborne/contact precaution. Vital signs checked. Patient is alert & oriented. On peritoneal dialysis. Cont. O2 sat. Monitoring.           Problem: Adult Inpatient Plan of Care  Goal: Plan of Care Review  Outcome: Progressing  Goal: Patient-Specific Goal (Individualized)  Outcome: Progressing  Goal: Absence of Hospital-Acquired Illness or Injury  Outcome: Progressing  Intervention: Identify and Manage Fall Risk  Recent Flowsheet Documentation  Taken 09/16/2022 1830 by Lyndee Leo, RN  Safety Interventions:   low bed   lighting adjusted for tasks/safety   fall reduction program maintained  Goal: Optimal Comfort and Wellbeing  Outcome: Progressing  Goal: Readiness for Transition of Care  Outcome: Progressing  Goal: Rounds/Family Conference  Outcome: Progressing     Problem: Wound  Goal: Optimal Coping  Outcome: Progressing  Goal: Optimal Functional Ability  Outcome: Progressing  Intervention: Optimize Functional Ability  Recent Flowsheet Documentation  Taken 09/16/2022 1830 by Lyndee Leo, RN  Activity Management: up in chair  Goal: Absence of Infection Signs and Symptoms  Outcome: Progressing  Goal: Improved Oral Intake  Outcome: Progressing  Goal: Optimal Pain Control and Function  Outcome: Progressing  Goal: Skin Health and Integrity  Outcome: Progressing  Intervention: Optimize Skin Protection  Recent Flowsheet Documentation  Taken 09/16/2022 1830 by Lyndee Leo, RN  Activity Management: up in chair  Head of Bed (HOB) Positioning: HOB at 30-45 degrees  Goal: Optimal Wound Healing  Outcome: Progressing     Problem: Comorbidity Management  Goal: Maintenance of Asthma Control  Outcome: Progressing  Goal: Maintenance of Behavioral Health Symptom Control  Outcome: Progressing  Goal: Maintenance of COPD Symptom Control  Outcome: Progressing  Goal: Blood Glucose Levels Within Targeted Range  Outcome: Progressing  Goal: Maintenance of Heart Failure Symptom Control  Outcome: Progressing  Goal: Blood Pressure in Desired Range  Outcome: Progressing  Goal: Maintenance of Osteoarthritis Symptom Control  Outcome: Progressing  Intervention: Maintain Osteoarthritis Symptom Control  Recent Flowsheet Documentation  Taken 09/16/2022 1830 by Lyndee Leo, RN  Activity Management: up in chair  Goal: Bariatric Home Regimen Maintained  Outcome: Progressing  Goal: Maintenance of Seizure Control  Outcome: Progressing     Problem: Fall Injury Risk  Goal: Absence of Fall and Fall-Related Injury  Outcome: Progressing  Intervention: Promote Injury-Free Environment  Recent Flowsheet Documentation  Taken 09/16/2022 1830 by Lyndee Leo, RN  Safety Interventions:   low bed   lighting adjusted for tasks/safety   fall reduction program maintained     Problem: Self-Care Deficit  Goal: Improved Ability to Complete Activities of Daily Living  Outcome: Progressing

## 2022-09-17 NOTE — Unmapped (Signed)
VENOUS ACCESS ULTRASOUND PROCEDURE NOTE    Indications:   Poor venous access.    The Venous Access Team has assessed this patient for the placement of a PIV. Ultrasound guidance was necessary to obtain access.     Procedure Details:  Identity of the patient was confirmed via name, medical record number and date of birth. The availability of the correct equipment was verified.    The vein was identified for ultrasound catheter insertion.  Field was prepared with necessary supplies and equipment.  Probe cover and sterile gel utilized.  Insertion site was prepped with chlorhexidine solution and allowed to dry.  The catheter extension was primed with normal saline. A(n) 22 gauge 1.75 catheter was placed in the R Forearm with 1attempt(s). See LDA for additional details.    Catheter aspirated, 3 mL blood return present. The catheter was then flushed with 10 mL of normal saline. Insertion site cleansed, and dressing applied per manufacturer guidelines. The catheter was inserted with difficulty due to poor vasculatureby Melodye Ped, RN.      RN was notified.     Thank you,     Melodye Ped, RN Venous Access Team   [phone]     Workup / Procedure Time:  30 minutes    See vein image below:

## 2022-09-17 NOTE — Unmapped (Signed)
ADULT PERITONEAL DIALYSIS TREATMENT NOTE    PROCEDURE DATE/TIME:    09/16/22 8:36 PM PD THERAPY DAY:  1 PD DEVICE:   ([address])     THERAPY TYPE:  Continuous Cycling Peritoneal Dialysis - Standard     CONSENT:    Written consent was obtained prior to the procedure and is detailed in the medical record. Prior to the start of the procedure, a time out was taken and the identity of the patient was confirmed via name, medical record number and date of birth.    Active Dialysis Orders (168h ago, onward)       Start     Ordered    09/16/22 1928  Peritoneal Dialysis - CCPD Standard  Daily      Question Answer Comment   Therapy Time (hours) 8 hours    Total Number of Cycles Other (Please specify) 3   Exchange Volume (L) 2L    Day dwell/Last fill (mL) 0    Dialysate last fill with bag as ordered        09/16/22 1927                    VITAL SIGNS:  Vitals:    09/16/22 2000   BP: 116/62   Pulse: 90   Resp: 16   Temp: 37 ??C (98.6 ??F)   SpO2:     There were no vitals filed for this visit.     LAB RESULTS:    Potassium   Date Value Ref Range Status   09/16/2022 3.0 (L) 3.4 - 4.8 mmol/L Final       DIALYSATE FLUID:  Dianeal Solution: Dextrose 1.5% Calcium 2.5 mEq/L   Additives:  None    ACCESS:  Peritoneal Dialysis Catheter Continuous cycling Left lower abdomen (Active)   Site Assessment Clean;Intact 09/16/22 2021   Dressing Occlusive 09/16/22 2021   Dressing Status      Changed 09/16/22 2021   Dressing Change Due 09/23/22 09/16/22 2021   Dressing Intervention No intervention needed 09/16/22 2021   Status Accessed 09/16/22 2021   PD Catheter Transfer Set [employer] Connector 09/16/22 2021     Patient Lines/Drains/Airways Status       Active Peripheral & Central Intravenous Access       Name Placement date Placement time Site Days    Peripheral IV 09/15/22 Right Antecubital 09/15/22  1342  Antecubital  1                    SETTINGS:  Mode: Standard Minimum Drain Volume (%): 85%   Smart Dwells: Yes Heater Bag Empty: No   Tidal Full Drains: Yes Flush: Yes   Program Locked: No I-Drain Alarm (mL): 0 mL   Program Verfied: Yes     Lines Unclamped:  Yes.      INITIAL DRAIN:    Inital Outflow Effluent Appearance: Other (Comment) (NOT ABLE TO SEE) Initial Drain Volume (mL): 1 mL     THERAPY DETAILS:  Peritoneal Dialysis Fill Volume (ml): 2000 ml Peritoneal Dialysis Total Volume (ml): 6000 ml           EXCHANGE NET BALANCE:              DIALYSIS ON-CALL NURSE PAGER NUMBER:  Monday thru Friday 0700 - 1730: Call the Dialysis Unit ext. [phone]   After 1730 and all day Sunday: Call the Dialysis RN Pager Number [phone]     PROCEDURE REVIEW, VERIFICATION, HANDOFF:  PD settings verified, procedure  reviewed, and instructions given to primary RN.  Dialysis RN Verifying: Michell Heinrich Primary PD RN Verifying: DaJah' Atwater-Rickard

## 2022-09-17 NOTE — Unmapped (Addendum)
Pt alert and oriented. No prns given. Spec/Airborne precautions maintained. No c/o pain. Pt lost iv access; VAT placed new iv. Received iv abx. Pt on RA. bolus of LR given. Cont LR infusing. PO K+ given; pt only tolerated one pill then vomited. MD notified and asked for powder K+instead; pt agreed powder K+ would be more tolerable. PD session overnight. Save left arm. Free from falls. Bed low and locked. Call bell in reach.     Problem: Adult Inpatient Plan of Care  Goal: Plan of Care Review  Outcome: Progressing  Goal: Patient-Specific Goal (Individualized)  Outcome: Progressing  Goal: Absence of Hospital-Acquired Illness or Injury  Outcome: Progressing  Intervention: Identify and Manage Fall Risk  Recent Flowsheet Documentation  Taken 09/16/2022 2230 by Atwater-Rickard, Derrill Kay' A, RN  Safety Interventions:   fall reduction program maintained   infection management   isolation precautions   lighting adjusted for tasks/safety   low bed   nonskid shoes/slippers when out of bed  Intervention: Prevent Infection  Recent Flowsheet Documentation  Taken 09/16/2022 2230 by Atwater-Rickard, Derrill Kay' A, RN  Infection Prevention:   cohorting utilized   environmental surveillance performed   equipment surfaces disinfected   hand hygiene promoted   personal protective equipment utilized   rest/sleep promoted   single patient room provided  Goal: Optimal Comfort and Wellbeing  Outcome: Progressing  Goal: Readiness for Transition of Care  Outcome: Progressing  Goal: Rounds/Family Conference  Outcome: Progressing     Problem: Wound  Goal: Optimal Coping  Outcome: Progressing  Goal: Optimal Functional Ability  Outcome: Progressing  Intervention: Optimize Functional Ability  Recent Flowsheet Documentation  Taken 09/16/2022 2230 by Atwater-Rickard, Derrill Kay' A, RN  Activity Management: (per tolerance) other (see comments)  Goal: Absence of Infection Signs and Symptoms  Outcome: Progressing  Intervention: Prevent or Manage Infection  Recent Flowsheet Documentation  Taken 09/16/2022 2230 by Atwater-Rickard, Derrill Kay' A, RN  Infection Management: aseptic technique maintained  Isolation Precautions: spec airborne/contact precautions maintained  Goal: Improved Oral Intake  Outcome: Progressing  Goal: Optimal Pain Control and Function  Outcome: Progressing  Goal: Skin Health and Integrity  Outcome: Progressing  Intervention: Optimize Skin Protection  Recent Flowsheet Documentation  Taken 09/16/2022 2230 by Atwater-Rickard, Derrill Kay' A, RN  Activity Management: (per tolerance) other (see comments)  Head of Bed (HOB) Positioning: HOB elevated  Goal: Optimal Wound Healing  Outcome: Progressing     Problem: Comorbidity Management  Goal: Maintenance of Asthma Control  Outcome: Progressing  Goal: Maintenance of Behavioral Health Symptom Control  Outcome: Progressing  Goal: Maintenance of COPD Symptom Control  Outcome: Progressing  Goal: Blood Glucose Levels Within Targeted Range  Outcome: Progressing  Goal: Maintenance of Heart Failure Symptom Control  Outcome: Progressing  Goal: Blood Pressure in Desired Range  Outcome: Progressing  Goal: Maintenance of Osteoarthritis Symptom Control  Outcome: Progressing  Intervention: Maintain Osteoarthritis Symptom Control  Recent Flowsheet Documentation  Taken 09/16/2022 2230 by Atwater-Rickard, Derrill Kay' A, RN  Activity Management: (per tolerance) other (see comments)  Goal: Bariatric Home Regimen Maintained  Outcome: Progressing  Goal: Maintenance of Seizure Control  Outcome: Progressing     Problem: Fall Injury Risk  Goal: Absence of Fall and Fall-Related Injury  Outcome: Progressing  Intervention: Promote Injury-Free Environment  Recent Flowsheet Documentation  Taken 09/16/2022 2230 by Barnett Applebaum' A, RN  Safety Interventions:   fall reduction program maintained   infection management   isolation precautions   lighting adjusted for tasks/safety   low bed  nonskid shoes/slippers when out of bed Problem: Self-Care Deficit  Goal: Improved Ability to Complete Activities of Daily Living  Outcome: Progressing

## 2022-09-17 NOTE — Unmapped (Signed)
Care Management  Initial Transition Planning Assessment              General  Care Manager assessed the patient by : Telephone conversation with family, Medical record review, Discussion with Clinical Care team (Pt is on airborne precautions so assessment completed by phone)  Orientation Level: Oriented X4  Functional level prior to admission: Independent  Reason for referral: Discharge Planning    CM spoke w/ pt to complete assessment. She is independent at home, uses a [facility] for ambulation (also has a RW).  She lives w/ her husband who provides transportation as pt does not drive.     Pt does PD at home and reports no issues w/ this. She is followed by [facility] [place].    No SDOH concerns expressed and pt denies discharge needs. CM will continue to monitor.    Contact/Decision Maker  Extended Emergency Contact Information  Primary Emergency Contact: ANEESAH, HOLSWORTH  Address: [address]           [place], [place] [address] [place] [employer] Phone: [phone]  Relation: Spouse  Preferred language: ENGLISH  Interpreter needed? No    Legal Next of Kin / Guardian / POA / Advance Directives     HCDM (patient stated preference): JONNITA, THALACKER - Spouse - [phone]      Readmission Information    Have you been hospitalized in the last 30 days?: No      Patient Information  Lives with: Spouse/significant other    Type of Residence: Private residence    Type of Residence:   [address]  [place] [address]  [phone] (mobile)        Medical Provider(s): Hedy Camara, MD    Reason for Admission: Admitting Diagnosis:  Weakness [R53.1]  Fever, unspecified fever cause [R50.9]    Previous admit date: 08/20/2020    Primary Insurance- Payor: Advertising copywriter MEDICARE ADV / Plan: UNITED HEALTHCARE DUAL COMPLETE HMO / Product Type: *No Product type* /   Secondary Insurance - None  Prescription Coverage - UHC  Preferred Pharmacy - [facility] [place], [place] - [address]  [facility]  [facility] [place] - [address]    Transportation home: Paediatric nurse Systems/Concerns: Spouse, Family Members    Responsibilities/Dependents at home?: No    Home Care services in place prior to admission?: No         Equipment Currently Used at Home: cane, straight, walker, rolling (Pt states she uses her cane most often)       Currently receiving outpatient dialysis?: Yes  Facility providing dialysis (Name/Contact Info): Does PD through [facility] [place]    Financial Information       Need for financial assistance?: No       Social Determinants of Health  Food Insecurity: No Food Insecurity (09/17/2022)    Hunger Vital Sign     Worried About Running Out of Food in the Last Year: Never true     Ran Out of Food in the Last Year: Never true      Financial Resource Strain: Low Risk  (09/17/2022)    Overall Financial Resource Strain (CARDIA)     Difficulty of Paying Living Expenses: Not very hard     Housing/Utilities: Low Risk  (09/17/2022)    Housing/Utilities     Within the  past 12 months, have you ever stayed: outside, in a car, in a tent, in an overnight shelter, or temporarily in someone else's home (i.e. couch-surfing)?: No     Are you worried about losing your housing?: No     Within the past 12 months, have you been unable to get utilities (heat, electricity) when it was really needed?: No     Transportation Needs: No Transportation Needs (09/17/2022)    PRAPARE - Therapist, art (Medical): No     Lack of Transportation (Non-Medical): No         Complex Discharge Information    Is patient identified as a difficult/complex discharge?: No    Discharge Needs Assessment  Concerns to be Addressed: denies needs/concerns at this time    Clinical Risk Factors: Multiple Diagnoses (Chronic), New Diagnosis, Dialysis    Barriers to taking medications: No    Prior overnight hospital stay or ED visit in last 90 days: No    Anticipated Changes Related to Illness: none    Equipment Needed After Discharge: none    Discharge Facility/Level of Care Needs: other (see comments) (home w/ self care and resumption of PD)    Readmission  Risk of Unplanned Readmission Score: UNPLANNED READMISSION SCORE: 24.53%  Predictive Model Details          25% (High)  Factor Value    Calculated 09/17/2022 08:04 23% Number of ED visits in last six months 5     Risk of Unplanned Readmission Model 17% Number of active inpatient medication orders 30     8% ECG/EKG order present in last 6 months     8% Latest calcium low (7.8 mg/dL)     7% Latest BUN high (58 mg/dL)     6% Diagnosis of electrolyte disorder present     6% Imaging order present in last 6 months     5% Phosphorous result present     4% Diagnosis of deficiency anemia present     4% Latest creatinine high (14.63 mg/dL)     3% Age 54     3% Diagnosis of renal failure present     3% Charlson Comorbidity Index 3     2% Future appointment scheduled     1% Active ulcer inpatient medication order present     1% Current length of stay 0.87 days      Readmitted Within the Last 30 Days? (No if blank)   Patient at risk for readmission?: Yes    Discharge Plan  Screen findings are: Care Manager reviewed the plan of the patient's care with the Multidisciplinary Team. No discharge planning needs identified at this time. Care Manager will continue to manage plan and monitor patient's progress with the team.    Expected Discharge Date:     Expected Transfer from Critical Care:      Quality data for continuing care services shared with patient and/or representative?: N/A  Patient and/or family were provided with choice of facilities / services that are available and appropriate to meet post hospital care needs?: N/A       Initial Assessment complete?: Yes

## 2022-09-18 LAB — BASIC METABOLIC PANEL
ANION GAP: 13 mmol/L (ref 5–14)
BLOOD UREA NITROGEN: 33 mg/dL — ABNORMAL HIGH (ref 9–23)
BUN / CREAT RATIO: 3
CALCIUM: 7.8 mg/dL — ABNORMAL LOW (ref 8.7–10.4)
CHLORIDE: 106 mmol/L (ref 98–107)
CO2: 22 mmol/L (ref 20.0–31.0)
CREATININE: 12.26 mg/dL — ABNORMAL HIGH
EGFR CKD-EPI (2021) FEMALE: 3 mL/min/{1.73_m2} — ABNORMAL LOW (ref >=60)
GLUCOSE RANDOM: 76 mg/dL (ref 70–179)
POTASSIUM: 3.4 mmol/L (ref 3.4–4.8)
SODIUM: 141 mmol/L (ref 135–145)

## 2022-09-18 LAB — MAGNESIUM: MAGNESIUM: 1.5 mg/dL — ABNORMAL LOW (ref 1.6–2.6)

## 2022-09-18 LAB — PHOSPHORUS: PHOSPHORUS: 5.5 mg/dL — ABNORMAL HIGH (ref 2.4–5.1)

## 2022-09-18 LAB — CBC
HEMATOCRIT: 33.4 % — ABNORMAL LOW (ref 34.0–44.0)
HEMOGLOBIN: 10.7 g/dL — ABNORMAL LOW (ref 11.3–14.9)
MEAN CORPUSCULAR HEMOGLOBIN CONC: 32.2 g/dL (ref 32.0–36.0)
MEAN CORPUSCULAR HEMOGLOBIN: 28.9 pg (ref 25.9–32.4)
MEAN CORPUSCULAR VOLUME: 89.9 fL (ref 77.6–95.7)
MEAN PLATELET VOLUME: 7.7 fL (ref 6.8–10.7)
PLATELET COUNT: 189 10*9/L (ref 150–450)
RED BLOOD CELL COUNT: 3.71 10*12/L — ABNORMAL LOW (ref 3.95–5.13)
RED CELL DISTRIBUTION WIDTH: 13.5 % (ref 12.2–15.2)
WBC ADJUSTED: 8.6 10*9/L (ref 3.6–11.2)

## 2022-09-18 MED ORDER — POTASSIUM CHLORIDE ER 20 MEQ TABLET,EXTENDED RELEASE(PART/CRYST)
ORAL_TABLET | Freq: Every day | ORAL | 0 refills | 14 days | Status: CP
Start: 2022-09-18 — End: 2022-10-02

## 2022-09-18 MED ORDER — MAGNESIUM COMPLEX 300 MG MAGNESIUM TABLET
ORAL_TABLET | Freq: Every day | ORAL | 0 refills | 30 days | Status: CP
Start: 2022-09-18 — End: 2022-10-18

## 2022-09-18 MED ADMIN — sevelamer (RENVELA) tablet 800 mg: 800 mg | ORAL | @ 15:00:00

## 2022-09-18 MED ADMIN — dianeal low CA - dextrose 1.5% and calcium 2.5 5,000 mL infusion: INTRAPERITONEAL | @ 02:00:00

## 2022-09-18 MED ADMIN — pantoprazole (Protonix) EC tablet 40 mg: 40 mg | ORAL | @ 15:00:00

## 2022-09-18 MED ADMIN — dianeal low CA - dextrose 1.5% and calcium 2.5 2,000 mL infusion: INTRAPERITONEAL | @ 02:00:00

## 2022-09-18 MED ADMIN — atorvastatin (LIPITOR) tablet 80 mg: 80 mg | ORAL | @ 15:00:00

## 2022-09-18 MED ADMIN — calcium gluconate in sodium chloride (NS) 0.9% 2 gram/100 mL IVPB 2 g: 2 g | INTRAVENOUS | @ 18:00:00 | Stop: 2022-09-18

## 2022-09-18 MED ADMIN — magnesium sulfate in D5W 1 gram/100 mL infusion 1 g: 1 g | INTRAVENOUS | @ 15:00:00 | Stop: 2022-09-18

## 2022-09-18 MED ADMIN — ondansetron (ZOFRAN) injection 4 mg: 4 mg | INTRAVENOUS | @ 05:00:00 | Stop: 2022-09-18

## 2022-09-18 MED ADMIN — allopurinoL (ZYLOPRIM) tablet 100 mg: 100 mg | ORAL | @ 15:00:00

## 2022-09-18 MED ADMIN — potassium chloride (KLOR-CON) packet 40 mEq: 40 meq | ORAL | @ 08:00:00 | Stop: 2022-09-18

## 2022-09-18 MED ADMIN — isosorbide mononitrate (IMDUR) 24 hr tablet 120 mg: 120 mg | ORAL | @ 15:00:00

## 2022-09-18 MED ADMIN — sevelamer (RENVELA) tablet 800 mg: 800 mg | ORAL | @ 23:00:00

## 2022-09-18 MED ADMIN — calcitriol (ROCALTROL) capsule 1 mcg: 1 ug | ORAL | @ 15:00:00

## 2022-09-18 MED ADMIN — calcium gluc in NaCl, iso-osm 1 gram/50 mL IVPB 1 g: 1 g | INTRAVENOUS | @ 08:00:00 | Stop: 2022-09-18

## 2022-09-18 MED ADMIN — amlodipine (NORVASC) tablet 10 mg: 10 mg | ORAL | @ 15:00:00

## 2022-09-18 MED ADMIN — magnesium sulfate 2gm/50mL IVPB: 2 g | INTRAVENOUS | @ 20:00:00 | Stop: 2022-09-18

## 2022-09-18 MED ADMIN — sevelamer (RENVELA) tablet 800 mg: 800 mg | ORAL | @ 18:00:00

## 2022-09-18 MED ADMIN — fluticasone furoate (ARNUITY ELLIPTA) 100 mcg/actuation inhaler 1 puff: 1 | RESPIRATORY_TRACT | @ 15:00:00

## 2022-09-18 MED ADMIN — pantoprazole (Protonix) EC tablet 40 mg: 40 mg | ORAL | @ 02:00:00

## 2022-09-18 NOTE — Unmapped (Signed)
Nephrology (MEDB) Progress Note    Assessment & Plan:   Megan Robertson is a 51 y.o. 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      Principal Problem:    Sepsis (CMS-HCC)  Active Problems:    Gout    GERD (gastroesophageal reflux disease)    Hypertension    OSA (obstructive sleep apnea)    Hypokalemia    Tachycardia    Anemia in chronic kidney disease    ESRD (end stage renal disease) on dialysis (CMS-HCC)    High serum lactate    Melena    Coffee ground emesis    Nausea    Headache    Blurry vision  Resolved Problems:    * No resolved hospital problems. *      Active Problems    Sepsis of Unknown Origin - Muscles Aches - Nausea/Vomiting - Poor PO Intake  Presented with leukocytosis and tachycardia, with additonal symptoms possibly ongoing from covid infection as she denied any time where symptoms truly improved. No abdominal distension or tenderness. Chest XR showed no acute abnormalities and chest CT was without evidence of PE. Blood cultures have remained negative to date and WBC count has normalized.   - Fllow up PD culture  - F/up BCx2   - Discontinue cefepime & vancomycin      Headaches - Blurry Vision  Blurry vision only occurred in context of headaches. Does have a history of migraines. May be having headaches in the context of illness as above.   - Continue monitoring for symptoms   - Symptomatic treatment      Reported Melena & Coffee Ground Emesis - AoCKD  Symptoms occurred two days prior to admission and have not recurred. Hgb stable throughout admission and patient remained hemodynamically stable. No current concern for active bleed. S/p IV pantoprazole, now tolerating orals.   - monitor CBC   - Pantoprazole 40mg  PO BID     ESRD on PD  Deferred initiating PD overnight as no indications for acute dialysis. Resume normal schedule today.   - Nightly PD   - Daily BMP  - Continue sevelamer 800mg  TID   - Continue calcitriol daily   - Continue cinacalcet 90mg  daily     Hypokalemia  Pt with chronically low potassium levels in the past, as well as during this admission. S/p oral replacement.  - BMP daily   - At discharge, start K once daily for two weeks and recheck outpatient to ensure stable K      Chronic Problems     Psoriasis: On Humira, follows with [facility]. Holding humira.  HTN: Continue amlodipine, imdur   Hx Gout: Continue allopurinol       Daily Checklist:  Diet: Regular Diet  DVT PPx: Contraindicated 2/2 concern for active bleed on admission.  Electrolytes: Potassium and Magnesium Repleted  Code Status: Full Code  Dispo:  Plan to discharge to home tomorrow Tues 11/28    Team Contact Information:   Primary Team: Nephrology (MEDB)  Primary Resident: Alvino Chapel, MD, MD  Resident's Pager: [phone] (Nephrology Intern - Tower)    Interval History:   No acute events overnight. Patient states pain is well controlled.  Tolerating PO well without any nausea or vomiting.  Denies any  concerns today.  Symptoms have significantly improved. Patient is ok with discharge to home soon.     All other systems were reviewed and are negative except as noted in the HPI    Objective:   Temp:  [36.6 ??C (97.9 ??F)-37 ??C (98.6 ??F)] 36.6 ??C (97.9 ??F)  Heart Rate:  [90-100] 98  Resp:  [16-18] 16  BP: (96-116)/(54-62) 107/54  SpO2:  [97 %-100 %] 99 %    Gen: NAD, converses easily   HENT: atraumatic, normocephalic  Heart: RRR  Lungs: CTAB, no crackles or wheezes  Abdomen: soft, NTND  Extremities: No edema     ________________________________  Alvino Chapel, MD   September 17, 2022 6:50 PM

## 2022-09-18 NOTE — Unmapped (Signed)
ADULT PERITONEAL DIALYSIS TREATMENT NOTE    PROCEDURE DATE/TIME:    09/18/22 1:36 AM PD THERAPY DAY:  2 PD DEVICE:   ([place])     THERAPY TYPE:  Continuous Cycling Peritoneal Dialysis - Standard     CONSENT:    Written consent was obtained prior to the procedure and is detailed in the medical record. Prior to the start of the procedure, a time out was taken and the identity of the patient was confirmed via name, medical record number and date of birth.    Active Dialysis Orders (168h ago, onward)       Start     Ordered    09/16/22 1928  Peritoneal Dialysis - CCPD Standard  Daily      Question Answer Comment   Therapy Time (hours) 8 hours    Total Number of Cycles Other (Please specify) 3   Exchange Volume (L) 2L    Day dwell/Last fill (mL) 0    Dialysate last fill with bag as ordered        09/16/22 1927                    VITAL SIGNS:  Vitals:    09/17/22 2025   BP: 110/57   Pulse: 90   Resp: 18   Temp: 37 ??C (98.6 ??F)   SpO2: 94%    Vitals:    09/17/22 1414   Weight: 83.4 kg (183 lb 13.8 oz)        LAB RESULTS:    Potassium   Date Value Ref Range Status   09/17/2022 3.0 (L) 3.4 - 4.8 mmol/L Final       DIALYSATE FLUID:  Dianeal Solution: Dextrose 1.5% Calcium 2.5 mEq/L   Additives:  None    ACCESS:  Peritoneal Dialysis Catheter Continuous cycling Left lower abdomen (Active)   Site Assessment Clean;Dry;Intact 09/17/22 2059   Dressing Occlusive 09/17/22 2059   Dressing Status      Clean;Dry;Intact/not removed 09/17/22 2059   Dressing Change Due 09/23/22 09/17/22 2059   Dressing Intervention No intervention needed 09/17/22 2059   Status Accessed 09/17/22 2059   PD Catheter Transfer Set [employer] Connector 09/17/22 2059     Patient Lines/Drains/Airways Status       Active Peripheral & Central Intravenous Access       Name Placement date Placement time Site Days    Peripheral IV 09/16/22 Anterior;Right Forearm 09/16/22  2327  Forearm  1                    SETTINGS:  Mode: Standard Minimum Drain Volume (%): 85% Smart Dwells: Yes Heater Bag Empty: No   Tidal Full Drains: Yes Flush: Yes   Program Locked: No I-Drain Alarm (mL): 0 mL   Program Verfied: Yes     Lines Unclamped:  Yes.      INITIAL DRAIN:    Inital Outflow Effluent Appearance: Clear, Yellow Initial Drain Volume (mL): 72 mL     THERAPY DETAILS:  Peritoneal Dialysis Fill Volume (ml): 2000 ml Peritoneal Dialysis Total Volume (ml): 6000 ml   Average Dwell Time (Minutes): 140 Minutes (added dwell = 15 min)       EXCHANGE NET BALANCE:  PD Net Exchange Intake (mL): 11 ml           DIALYSIS ON-CALL NURSE PAGER NUMBER:  Monday thru Friday 0700 - 1730: Call the Dialysis Unit ext. [phone]   After 1730 and all day Sunday: Call  the Dialysis RN Pager Number [phone]     PROCEDURE REVIEW, VERIFICATION, HANDOFF:  PD settings verified, procedure reviewed, and instructions given to primary RN.  Dialysis RN Verifying: S Gudrun Axe RN Primary PD RN Verifying: Ellen Henri RN

## 2022-09-18 NOTE — Unmapped (Signed)
Physician Discharge Summary [facility]  [facility]  [address]  [place] [address]  Dept: [phone]  Loc: [phone]     Identifying Information:   Arias Barmes [facility]  Feb 05, 1971  [id number]    Primary Care Physician: Hedy Camara, MD     Code Status: Full Code    Admit Date: 09/15/2022    Discharge Date: 09/19/2022     Discharge To: Home    Discharge Service: [facility] - Nephrology Floor Team (MED B - Tower)     Discharge Attending Physician: Carney Bern, MD    Discharge Diagnoses:   Principal Problem:    Sepsis (CMS-HCC) (POA: Yes)  Active Problems:    Gout (POA: Yes)    GERD (gastroesophageal reflux disease) (POA: Yes)    Hypertension (POA: Yes)    OSA (obstructive sleep apnea) (POA: Yes)    Hypokalemia (POA: Yes)    Tachycardia (POA: Yes)    Anemia in chronic kidney disease (POA: Yes)    ESRD (end stage renal disease) on dialysis (CMS-HCC) (POA: Not Applicable)    High serum lactate (POA: Yes)    Melena (POA: Unknown)    Coffee ground emesis (POA: Unknown)    Nausea (POA: Unknown)    Headache (POA: Unknown)    Blurry vision (POA: Unknown)  Resolved Problems:    * No resolved hospital problems. *      Hospital Course:   Eleny Laday is a 51 y.o. 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      Peritoneal Fluid Infection   Sepsis of Unknown Origin   Presented with symptoms that could potentially represent continued Covid infections, as patient denied any improvement since her initial positive covid test diagnosis. Muscle aches, poor PO intake, chills, nausea, vomiting. Was tachycardic with leukocytosis at admission. By time of discharge, symptoms had improved and patient was able to tolerate PO. Blood cultures remained negative. Peritoneal fluid studies were unremarkable. Treated with broad antibiotics which were discontinued by discharge. On initial planned date of discharge, peritoneal fluid culture came back positive for gram positive cocci and patient was kept overnight in order to repeat studies. The following day, repeat studies remained negative and patient was deemed appropriate for discharge with follow up to be provided by outpatient nephrology dialysis provider.    Headaches/Blurry Vision   Patient with blurry vision associated only with headaches. Also with history of migraines. Symptoms were monitored throughout admission and patient was managed symptomatically for pain.    Reported Melena & Coffee Ground Emesis   Reported dark black stools and emesis which occurred two days prior to discharge. Patient stooled regularly during admission without recurrence of any concern for bloody stools. Blood counts were closely monitored and hemoglobin remained stable throughout admission. Patient was treated with IV pantoprazole which was transitioned to oral by day of discharge.     ESRD on Peritoneal Dialysis   Continued with nightly peritoneal dialysis sessions throughout hospitalization. Also continued patient on her home medication regimen of sevelamer, calcitriol, and Cinacalcet.     Hypokalemia   Patient with history of hypokalemia prior to this admission. Potassium was replaced during her stay, though efficacy was limited by initial nausea and vomiting. Patient was eventually able to tolerate oral medications enough to receive her potassium  replacement by time of discharge. She was sent home on oral potassium replacement with plans to recheck levels outpatient to ensure sustained normalization.  The patient's hospital stay has been complicated by the following clinically significant conditions requiring additional evaluation and treatment or having a significant effect of this patient's care: - Chronic kidney disease POA requiring further investigation, treatment, or monitoring  - Hypokalemia POA requiring further investigation, treatment, or monitoring     Outpatient Provider Follow Up Issues:   -- Follow up peritoneal fluid culture  -- Recheck potassium.  -- Recheck magnesium.   -- Evaluate need for PPI continuation   -- Resume cinacalcet if appropriate based on electrolytes.    Touchbase with Outpatient Provider:  Warm Handoff: Completed on 09/19/22 by Alvino Chapel, MD  (Intern) via [facility] Message    Procedures:  dialysis  ______________________________________________________________________  Discharge Medications:      Your Medication List        STOP taking these medications      cinacalcet 90 MG tablet  Commonly known as: SENSIPAR            START taking these medications      MAGNESIUM COMPLEX 300 mg magnesium Tab  Generic drug: magnesium carb,citrate,oxide  Take 300 mg by mouth in the morning.     pantoprazole 40 MG tablet  Commonly known as: Protonix  Take 1 tablet (40 mg total) by mouth two (2) times a day.     potassium chloride 20 MEQ ER tablet  Take 1 tablet (20 mEq total) by mouth daily for 14 days.            CHANGE how you take these medications      beclomethasone dipropionate 80 mcg/actuation inhaler  Commonly known as: QVAR REDIHALER  1 puff every morning, 2 puffs every evening  What changed:   when to take this  reasons to take this  additional instructions            CONTINUE taking these medications      allopurinoL 100 MG tablet  Commonly known as: ZYLOPRIM  Take 1 tablet (100 mg total) by mouth Every Tuesday, Thursday and Saturday.     amlodipine 10 MG tablet  Commonly known as: NORVASC  Take 1 tablet (10 mg total) by mouth daily.     apixaban 5 mg Tab  Commonly known as: ELIQUIS  Take 1 tablet (5 mg total) by mouth in the morning.     aspirin 81 MG chewable tablet  Chew 1 tablet (81 mg total) daily.     atorvastatin 80 MG tablet  Commonly known as: LIPITOR  Take 1 tablet (80 mg total) by mouth daily.     calcitriol 0.5 MCG capsule  Commonly known as: ROCALTROL  Take 2 capsules (1 mcg total) by mouth daily.     fluocinolone 0.01 % external oil  Apply topically daily as needed. Use for psoriasis and scalp scaling.     furosemide 40 MG tablet  Commonly known as: LASIX  Take 1 tablet (40 mg total) by mouth 3 (three) times a week. To be taken once a day if you develop leg swelling.     HUMIRA(CF) PEN 40 mg/0.4 mL injection  Generic drug: adalimumab  Inject the contents of 1 pen (40 mg total) under the skin every fourteen (14) days.     HUMIRA(CF) PEN PSOR-UV-ADOL HS 80 mg/0.8 mL-40 mg/0.4 mL Pnkt  Generic drug: adalimumab  Inject the contents of 1 pen (  80mg ) under the skin for one dose, THEN 1 pen (40mg ) every 14 days thereafter.     isosorbide mononitrate 120 MG 24 hr tablet  Commonly known as: IMDUR  Take 1 tablet (120 mg total) by mouth daily.     ondansetron 4 MG tablet  Commonly known as: ZOFRAN  Take 1 tablet (4 mg total) by mouth every six (6) hours for 3 days.     REPATHA SURECLICK 140 mg/mL Pnij  Generic drug: evolocumab  Inject the contents of 1 pen (140 mg) under the skin every fourteen (14) days.     sevelamer 800 mg tablet  Commonly known as: RENVELA  Take 1 tablet (800 mg total) by mouth.     triamcinolone 0.1 % ointment  Commonly known as: KENALOG  Apply topically Two (2) times a day. To psoriasis spots. Stop when skin is clear              Allergies:  Cephalexin, Mango, Peach (prunus persica), Wellbutrin [bupropion hcl], Egg, Fish containing products, Lactase-rennet, Lexiscan [regadenoson], Mushroom, and Tomato (solanum lycopersicum)  ______________________________________________________________________  Pending Test Results:  Pending Labs       Order Current Status    Ascitic/Peritoneal Fluid Culture Preliminary result    Blood Culture Preliminary result    Blood Culture Preliminary result    Dialysis Fluid Culture Preliminary result            Most Recent Labs:  All lab results last 24 hours -   Recent Results (from the past 24 hour(s))   Dialysis Fluid Culture    Collection Time: 09/19/22 12:25 AM    Specimen: Peritoneum; Fluid, Peritoneal Dialysis   Result Value Ref Range    Dialysis Fluid Culture NO GROWTH TO DATE     Gram Stain Result Direct Specimen Gram Stain     Gram Stain Result No polymorphonuclear leukocytes seen     Gram Stain Result No organisms seen    Body fluid cell count    Collection Time: 09/19/22 12:27 AM   Result Value Ref Range    Fluid Type Fluid, Peritoneal Dialysis     Color, Fluid Colorless     Appearance, Fluid Clear     Nucleated Cells, Fluid 9 Undefined ul    RBC, Fluid 2 ul    Neutrophil %, Fluid 33.0 %    Lymphocytes %, Fluid 28.0 %    Mono/Macro % , Fluid 34.0 %    Eosinophils %, Fluid 5.0 %    #Cells Counted BF Diff 100     Fluid Comments       Reactive lymphocytes present.  Toxic Vacuolation present.  Degenerating cells present.  Macrophages present.     Basic metabolic panel    Collection Time: 09/19/22  4:13 AM   Result Value Ref Range    Sodium 139 135 - 145 mmol/L    Potassium 3.0 (L) 3.4 - 4.8 mmol/L    Chloride 105 98 - 107 mmol/L    CO2 21.0 20.0 - 31.0 mmol/L    Anion Gap 13 5 - 14 mmol/L    BUN 37 (H) 9 - 23 mg/dL    Creatinine 16.10 (H) 0.55 - 1.02 mg/dL    BUN/Creatinine Ratio 3     eGFR CKD-EPI (2021) Female 4 (L) >=60 mL/min/1.53m2    Glucose 102 70 - 179 mg/dL    Calcium 8.1 (L) 8.7 - 10.4 mg/dL   Magnesium Level    Collection Time: 09/19/22  4:13 AM  Result Value Ref Range    Magnesium 2.6 1.6 - 2.6 mg/dL   Phosphorus Level    Collection Time: 09/19/22  4:13 AM   Result Value Ref Range    Phosphorus 4.2 2.4 - 5.1 mg/dL   Type and Screen    Collection Time: 09/19/22  4:13 AM   Result Value Ref Range    ABO Grouping O NEG     Antibody Screen NEG    CBC    Collection Time: 09/19/22 10:18 AM   Result Value Ref Range    WBC 9.9 3.6 - 11.2 10*9/L    RBC 3.80 (L) 3.95 - 5.13 10*12/L    HGB 11.2 (L) 11.3 - 14.9 g/dL    HCT 16.1 09.6 - 04.5 %    MCV 89.4 77.6 - 95.7 fL    MCH 29.5 25.9 - 32.4 pg    MCHC 33.0 32.0 - 36.0 g/dL    RDW 40.9 81.1 - 91.4 %    MPV 7.7 6.8 - 10.7 fL    Platelet 193 150 - 450 10*9/L       Relevant Studies/Radiology:  Echocardiogram W Colorflow Spectral Doppler    Result Date: 09/16/2022  Patient Info Name:     Dariana Ellingboe Age:     51 years DOB:     [birth year] Gender:     Female MRN:     [id number] Accession #:     [id number] UN Account #:     [id number] Ht:     158 cm Wt:     82 kg BSA:     1.93 m2 BP:     107 /     67 mmHg Exam Date:     09/16/2022 11:11 AM Admit Date:     09/15/2022 Exam Type:     ECHOCARDIOGRAM W COLORFLOW SPECTRAL DOPPLER Technical Quality:     Fair Staff Sonographer:     Redmond Pulling Study Info Indications      - c/f pericardial effusion,  pericarditis Procedure(s)   Complete two-dimensional, color flow and Doppler transthoracic echocardiogram is performed. Summary   1. The left ventricle is normal in size with normal wall thickness.   2. The left ventricular systolic function is normal, LVEF is visually estimated at > 55%.   3. The right ventricle is normal in size, with normal systolic function. Left Ventricle   The left ventricle is normal in size with normal wall thickness. The left ventricular systolic function is normal, LVEF is visually estimated at > 55%. There is normal left ventricular diastolic function. Right Ventricle   The right ventricle is normal in size, with normal systolic function. Left Atrium   The left atrium is normal in size. Right Atrium   The right atrium is normal in size. Aortic Valve   The aortic valve is trileaflet with normal appearing leaflets with normal excursion. There is no significant aortic regurgitation. There is no evidence of a significant transvalvular gradient. Mitral Valve   The mitral valve leaflets are normal with normal leaflet mobility. There is no significant mitral valve regurgitation. Tricuspid Valve   The tricuspid valve leaflets are normal, with normal leaflet mobility. There is trivial tricuspid regurgitation. The pulmonary systolic pressure cannot be estimated due to insufficient TR signal. Pulmonic Valve   The pulmonic valve is normal. There is mild pulmonic regurgitation. There is no evidence of a significant transvalvular gradient. Aorta   The aorta is normal in size in the visualized segments. Inferior Vena Cava  IVC size and inspiratory change suggest mildly elevated right atrial pressure. (5-10 mmHg). Pericardium/Pleural   There is no pericardial effusion. Ventricles ---------------------------------------------------------------------- Name                                 Value        Normal ---------------------------------------------------------------------- LV Dimensions 2D/MM ----------------------------------------------------------------------  IVS Diastolic Thickness (2D)                                0.9 cm       0.6-0.9 LVID Diastole (2D)                  3.2 cm       3.8-5.2  LVPW Diastolic Thickness (2D)                                0.9 cm       0.6-0.9 LVID Systole (2D)                   2.1 cm       2.2-3.5 RV Dimensions 2D/MM ----------------------------------------------------------------------  RV Basal Diastolic Dimension                           3.3 cm       2.5-4.1 TAPSE                               1.5 cm         >=1.7 Atria ---------------------------------------------------------------------- Name                                 Value        Normal ---------------------------------------------------------------------- LA Dimensions ---------------------------------------------------------------------- LA Dimension (2D)                   3.0 cm       2.7-3.8 LA Volume Index (4C A-L)        10.04 ml/m2               LA Volume Index (2C A-L)        16.36 ml/m2               LA Volume (BP MOD)                   25 ml               LA Volume Index (BP MOD)        12.84 ml/m2   16.00-34.00 Aortic Valve ---------------------------------------------------------------------- Name                                 Value        Normal ---------------------------------------------------------------------- AV Doppler ---------------------------------------------------------------------- AV Peak Velocity                   1.6 m/s               AV Peak Gradient  11 mmHg Mitral Valve ---------------------------------------------------------------------- Name                                 Value        Normal ---------------------------------------------------------------------- MV Diastolic Function ---------------------------------------------------------------------- MV E Peak Velocity                 64 cm/s               MV A Peak Velocity                 81 cm/s               MV E/A                                 0.8               MV Annular TDI ---------------------------------------------------------------------- MV Septal e' Velocity             7.3 cm/s         >=8.0 MV E/e' (Septal)                       8.8               MV Lateral e' Velocity            7.2 cm/s        >=10.0 MV E/e' (Lateral)                      9.0               MV e' Average                     7.2 cm/s               MV E/e' (Average)                      8.9 Tricuspid Valve ---------------------------------------------------------------------- Name                                 Value        Normal ---------------------------------------------------------------------- Estimated PAP/RSVP ---------------------------------------------------------------------- RA Pressure                         8 mmHg           <=5 Pulmonic Valve ---------------------------------------------------------------------- Name                                 Value        Normal ---------------------------------------------------------------------- PV Doppler ---------------------------------------------------------------------- PV Peak Velocity 1.0 m/s Aorta ---------------------------------------------------------------------- Name                                 Value        Normal ---------------------------------------------------------------------- Ascending Aorta ---------------------------------------------------------------------- Ao Root Diameter (2D)               2.4 cm               Ao Root Diam Index (  2D)          1.2 cm/m2 Venous ---------------------------------------------------------------------- Name                                 Value        Normal ---------------------------------------------------------------------- IVC/SVC ---------------------------------------------------------------------- IVC Diameter (Exp 2D)               1.5 cm         <=2.1 Report Signatures Finalized by Almira Coaster  MD on 09/16/2022 04:19 PM    ECG 12 Lead    Result Date: 09/16/2022  SINUS TACHYCARDIA OTHERWISE NORMAL ECG WHEN COMPARED WITH ECG OF 15-Sep-2022 11:13, NO SIGNIFICANT CHANGE WAS FOUND Confirmed by Schuyler Amor [phone]) on 09/16/2022 4:06:31 PM    ECG 12 Lead    Result Date: 09/15/2022  NORMAL SINUS RHYTHM NORMAL ECG WHEN COMPARED WITH ECG OF 06-Sep-2022 16:50, NO SIGNIFICANT CHANGE WAS FOUND Confirmed by Debby Freiberg [phone]) on 09/15/2022 9:42:51 PM    CTA Chest W Contrast    Result Date: 09/15/2022  EXAM: CTA CHEST W CONTRAST DATE: 09/15/2022 5:00 PM ACCESSION: [id number] UN DICTATED: 09/15/2022 5:17 PM INTERPRETATION LOCATION: MAIN CAMPUS CLINICAL INDICATION: 51 years old Female with shortness of breath  TECHNIQUE: Contiguous axial images were reconstructed through the chest following a single breath hold helical acquisition during the administration of intravenous contrast material.  Images were reformatted in the coronal and sagittal planes. MIP slabs were also constructed. The examination was performed with limited z-axis coverage and reduced kVp to improve vascular opacification and reduce breath hold as well as contrast and radiation dose. DOSE:   100 kVp; DLP:   153 mGy*cm COMPARISON: CTA chest 06/24/2022 FINDINGS: PULMONARY ARTERIES: Satisfactory opacification of the pulmonary artery branches. No pulmonary emboli. Main pulmonary artery is normal in size. LUNGS, AIRWAYS, AND PLEURA: Few small regions of peripheral groundglass opacity in the right lung. Subsegmental atelectasis at the lung bases. No pleural effusion or pneumothorax. MEDIASTINUM AND LYMPH NODES: No enlarged intrathoracic lymph nodes. No other mediastinal abnormality. HEART AND AORTA: Cardiac chambers are normal in size. No pericardial effusion. Aorta is normal in caliber. CHEST WALL AND BONES: Multilevel degenerative changes of the spine. UPPER ABDOMEN: Unremarkable.     1.No evidence of pulmonary embolism. 2.Few small regions of peripheral groundglass opacity in the right lung, likely reflecting atelectasis and/or sequela of viral infection.    XR Chest Portable    Result Date: 09/15/2022  EXAM: XR CHEST PORTABLE DATE: 09/15/2022 11:37 AM ACCESSION: [id number] UN DICTATED: 09/15/2022 11:38 AM INTERPRETATION LOCATION: MAIN CAMPUS CLINICAL INDICATION: 51 years old Female with COUGH  TECHNIQUE: Single View AP Chest Radiograph. COMPARISON: 08/22/2022 FINDINGS: Unchanged support devices. Lungs are clear. No pleural effusion or pneumothorax. Unremarkable cardiomediastinal silhouette.     No acute abnormalities.   ______________________________________________________________________  Discharge Instructions:   Activity Instructions       Activity as tolerated                       Other Instructions       Call MD for:  difficulty breathing, headache or visual disturbances      Call MD for:  persistent nausea or vomiting      Call MD for:  severe uncontrolled pain      Call MD for:  temperature >38.5 Celsius      Discharge instructions      You were admitted to  [facility] with nausea, vomiting, muscle aches, and decreased food intake in the setting of recent covid infection. While here, you were treated with scheduled anti-nausea medications and IV fluids. You were treated with antibiotics which were discontinued by the time of discharge as symptoms improved and blood cultures remained negative. You received peritoneal dialysis per regular daily schedule. You were also treated for low potassium and magnesium levels while admitted to the hospital.     You are now ready to discharge and will be discharging to: Home    Please take your medications as prescribed. Medication changes are listed below and a full list of medications is in this discharge packet. Please keep your follow-up appointments after the hospital for ongoing care. It has been a pleasure taking care of you, we wish you the best.     MEDICATION CHANGES:  -- Start potassium  -- Start magnesium  -- Start pantoprazole  -- Stop cinacalcet  -- Please see AVS Discharge summary for detailed instructions on all medication prescriptions     FOLLOW-UP:  -- It is important to see your primary care provider (PCP) after being in the hospital. Your PCP is Hedy Camara, MD and the phone number of their office is [phone]. Please call your PCP's office as soon as possible to schedule an appointment with them.   -- Follow up with your outpatient dialysis center & physicians as scheduled   -- Appointments which have been scheduled for you:  Sep 25, 2022  4:00 PM  (Arrive by 3:35 PM)  RETURN  SLEEP with Nilsa Nutting, MD  [facility] [address] [place] [facility]) [address]  [place] [address]  [phone]                Appointments which have been scheduled for you      Sep 25, 2022  4:00 PM  (Arrive by 3:35 PM)  RETURN  SLEEP with Nilsa Nutting, MD  [facility] [address]  [facility] REGION) [address]  [address]  [place] [address]  [phone] ______________________________________________________________________  Discharge Day Services:  BP 101/54  - Pulse 93  - Temp 36.9 ??C (98.4 ??F)  - Resp 18  - Ht 157.5 cm (5' 2)  - Wt 83.4 kg (183 lb 13.8 oz)  - LMP  (LMP Unknown)  - SpO2 99%  - BMI 33.63 kg/m??     Pt seen on the day of discharge and determined appropriate for discharge.    Condition at Discharge: good    Length of Discharge: I spent greater than 30 mins in the discharge of this patient.    ________________________________  Alvino Chapel, MD   September 18, 2022 1:28 PM

## 2022-09-18 NOTE — Unmapped (Signed)
A&Ox4. VSS, on RA. Continuous pulse ox monitoring in place. Denies pain. Refused afternoon dose of zofran d/t not feeling nauseous at the time. Refused lipitor, sensipar, and calcitriol, and held amlodipine and imdur per parameters d/t soft BP, MD notified with no new orders. Save L arm precautions in place. Continuous LR infusing at 100 mL/hr per orders. Up to bedside commode, 1 BM noted. Endorses poor appetite. Remained free from falls and injury this shift. Bed in low and locked position with call bell and tray table within reach.    Problem: Adult Inpatient Plan of Care  Goal: Plan of Care Review  Outcome: Progressing  Goal: Patient-Specific Goal (Individualized)  Outcome: Progressing  Goal: Absence of Hospital-Acquired Illness or Injury  Outcome: Progressing  Intervention: Identify and Manage Fall Risk  Recent Flowsheet Documentation  Taken 09/17/2022 0730 by Dell Ponto, RN  Safety Interventions:   commode/urinal/bedpan at bedside   fall reduction program maintained   lighting adjusted for tasks/safety   low bed   no IV/BP/blood draw left arm   nonskid shoes/slippers when out of bed  Intervention: Prevent Skin Injury  Recent Flowsheet Documentation  Taken 09/17/2022 0730 by Dell Ponto, RN  Device Skin Pressure Protection:   absorbent pad utilized/changed   adhesive use limited  Skin Protection:   adhesive use limited   incontinence pads utilized  Intervention: Prevent and Manage VTE (Venous Thromboembolism) Risk  Recent Flowsheet Documentation  Taken 09/17/2022 0910 by Dell Ponto, RN  VTE Prevention/Management: ambulation promoted  Intervention: Prevent Infection  Recent Flowsheet Documentation  Taken 09/17/2022 0730 by Dell Ponto, RN  Infection Prevention:   cohorting utilized   hand hygiene promoted   personal protective equipment utilized   rest/sleep promoted   single patient room provided  Goal: Optimal Comfort and Wellbeing  Outcome: Progressing  Goal: Readiness for Transition of Care  Outcome: Progressing  Goal: Rounds/Family Conference  Outcome: Progressing     Problem: Wound  Goal: Optimal Coping  Outcome: Progressing  Goal: Optimal Functional Ability  Outcome: Progressing  Intervention: Optimize Functional Ability  Recent Flowsheet Documentation  Taken 09/17/2022 0730 by Dell Ponto, RN  Activity Management: up to bedside commode  Goal: Absence of Infection Signs and Symptoms  Outcome: Progressing  Intervention: Prevent or Manage Infection  Recent Flowsheet Documentation  Taken 09/17/2022 0730 by Dell Ponto, RN  Infection Management: aseptic technique maintained  Isolation Precautions: spec airborne/contact precautions maintained  Goal: Improved Oral Intake  Outcome: Progressing  Goal: Optimal Pain Control and Function  Outcome: Progressing  Goal: Skin Health and Integrity  Outcome: Progressing  Intervention: Optimize Skin Protection  Recent Flowsheet Documentation  Taken 09/17/2022 0730 by Dell Ponto, RN  Activity Management: up to bedside commode  Pressure Reduction Techniques:   frequent weight shift encouraged   heels elevated off bed  Head of Bed (HOB) Positioning: HOB at 30-45 degrees  Pressure Reduction Devices:   positioning supports utilized   pressure-redistributing mattress utilized  Skin Protection:   adhesive use limited   incontinence pads utilized  Goal: Optimal Wound Healing  Outcome: Progressing     Problem: Comorbidity Management  Goal: Maintenance of Asthma Control  Outcome: Progressing  Goal: Maintenance of Behavioral Health Symptom Control  Outcome: Progressing  Goal: Maintenance of COPD Symptom Control  Outcome: Progressing  Goal: Blood Glucose Levels Within Targeted Range  Outcome: Progressing  Goal: Maintenance of Heart Failure Symptom Control  Outcome: Progressing  Goal: Blood Pressure in Desired Range  Outcome: Progressing  Goal: Maintenance of Osteoarthritis Symptom Control  Outcome: Progressing  Intervention: Maintain Osteoarthritis Symptom Control  Recent Flowsheet Documentation  Taken 09/17/2022 0730 by Dell Ponto, RN  Activity Management: up to bedside commode  Goal: Bariatric Home Regimen Maintained  Outcome: Progressing  Goal: Maintenance of Seizure Control  Outcome: Progressing     Problem: Fall Injury Risk  Goal: Absence of Fall and Fall-Related Injury  Outcome: Progressing  Intervention: Promote Injury-Free Environment  Recent Flowsheet Documentation  Taken 09/17/2022 0730 by Dell Ponto, RN  Safety Interventions:   commode/urinal/bedpan at bedside   fall reduction program maintained   lighting adjusted for tasks/safety   low bed   no IV/BP/blood draw left arm   nonskid shoes/slippers when out of bed     Problem: Self-Care Deficit  Goal: Improved Ability to Complete Activities of Daily Living  Outcome: Progressing     Problem: Infection  Goal: Absence of Infection Signs and Symptoms  Outcome: Progressing  Intervention: Prevent or Manage Infection  Recent Flowsheet Documentation  Taken 09/17/2022 0730 by Dell Ponto, RN  Infection Management: aseptic technique maintained  Isolation Precautions: spec airborne/contact precautions maintained

## 2022-09-18 NOTE — Unmapped (Addendum)
Megan Robertson is a 51 y.o. 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      Peritoneal Fluid Infection   Sepsis of Unknown Origin   Presented with symptoms that could potentially represent continued Covid infections, as patient denied any improvement since her initial positive covid test diagnosis. Muscle aches, poor PO intake, chills, nausea, vomiting. Was tachycardic with leukocytosis at admission. By time of discharge, symptoms had improved and patient was able to tolerate PO. Blood cultures remained negative. Peritoneal fluid studies were unremarkable. Treated with broad antibiotics which were discontinued by discharge. On initial planned date of discharge, peritoneal fluid culture came back positive for gram positive cocci and patient was kept overnight in order to repeat studies. The following day, repeat studies remained negative and patient was deemed appropriate for discharge with follow up to be provided by outpatient nephrology dialysis provider.    Headaches/Blurry Vision   Patient with blurry vision associated only with headaches. Also with history of migraines. Symptoms were monitored throughout admission and patient was managed symptomatically for pain.    Reported Melena & Coffee Ground Emesis   Reported dark black stools and emesis which occurred two days prior to discharge. Patient stooled regularly during admission without recurrence of any concern for bloody stools. Blood counts were closely monitored and hemoglobin remained stable throughout admission. Patient was treated with IV pantoprazole which was transitioned to oral by day of discharge.     ESRD on Peritoneal Dialysis   Continued with nightly peritoneal dialysis sessions throughout hospitalization. Also continued patient on her home medication regimen of sevelamer, calcitriol, and Cinacalcet.     Hypokalemia   Patient with history of hypokalemia prior to this admission. Potassium was replaced during her stay, though efficacy was limited by initial nausea and vomiting. Patient was eventually able to tolerate oral medications enough to receive her potassium replacement by time of discharge. She was sent home on oral potassium replacement with plans to recheck levels outpatient to ensure sustained normalization.

## 2022-09-18 NOTE — Unmapped (Addendum)
A&Ox4, Vss on RA. Denies pain. Continuous pulse ox on. IV Calcium gluconate given as ordered. Pt on PD overnight, HD nurse, ended at 0550 this AM. Pt up to bedside commode, 1 BM this shift. Remained free from falls and injury. Bed low and locked, call bell and side table within reach. Special airborne precautions maintained.    Problem: Adult Inpatient Plan of Care  Goal: Plan of Care Review  Outcome: Progressing  Goal: Patient-Specific Goal (Individualized)  Outcome: Progressing  Goal: Absence of Hospital-Acquired Illness or Injury  Outcome: Progressing  Intervention: Identify and Manage Fall Risk  Recent Flowsheet Documentation  Taken 09/17/2022 2000 by Chauncy Passy, RN  Safety Interventions:   commode/urinal/bedpan at bedside   fall reduction program maintained   low bed  Intervention: Prevent Skin Injury  Recent Flowsheet Documentation  Taken 09/17/2022 2000 by Chauncy Passy, RN  Positioning for Skin: Supine/Back  Device Skin Pressure Protection: absorbent pad utilized/changed  Skin Protection: adhesive use limited  Intervention: Prevent and Manage VTE (Venous Thromboembolism) Risk  Recent Flowsheet Documentation  Taken 09/17/2022 2043 by Chauncy Passy, RN  VTE Prevention/Management: ambulation promoted  Intervention: Prevent Infection  Recent Flowsheet Documentation  Taken 09/17/2022 2000 by Chauncy Passy, RN  Infection Prevention:   hand hygiene promoted   single patient room provided   personal protective equipment utilized  Goal: Optimal Comfort and Wellbeing  Outcome: Progressing  Goal: Readiness for Transition of Care  Outcome: Progressing  Goal: Rounds/Family Conference  Outcome: Progressing     Problem: Wound  Goal: Optimal Coping  Outcome: Progressing  Goal: Optimal Functional Ability  Outcome: Progressing  Goal: Absence of Infection Signs and Symptoms  Outcome: Progressing  Intervention: Prevent or Manage Infection  Recent Flowsheet Documentation  Taken 09/17/2022 2000 by Chauncy Passy, RN  Infection Management: aseptic technique maintained  Isolation Precautions: spec airborne/contact precautions maintained  Goal: Improved Oral Intake  Outcome: Progressing  Goal: Optimal Pain Control and Function  Outcome: Progressing  Goal: Skin Health and Integrity  Outcome: Progressing  Intervention: Optimize Skin Protection  Recent Flowsheet Documentation  Taken 09/17/2022 2000 by Chauncy Passy, RN  Pressure Reduction Techniques: frequent weight shift encouraged  Pressure Reduction Devices: positioning supports utilized  Skin Protection: adhesive use limited  Goal: Optimal Wound Healing  Outcome: Progressing     Problem: Comorbidity Management  Goal: Maintenance of Asthma Control  Outcome: Progressing  Goal: Maintenance of Behavioral Health Symptom Control  Outcome: Progressing  Goal: Maintenance of COPD Symptom Control  Outcome: Progressing  Goal: Blood Glucose Levels Within Targeted Range  Outcome: Progressing  Goal: Maintenance of Heart Failure Symptom Control  Outcome: Progressing  Goal: Blood Pressure in Desired Range  Outcome: Progressing  Goal: Maintenance of Osteoarthritis Symptom Control  Outcome: Progressing  Goal: Bariatric Home Regimen Maintained  Outcome: Progressing  Goal: Maintenance of Seizure Control  Outcome: Progressing     Problem: Fall Injury Risk  Goal: Absence of Fall and Fall-Related Injury  Outcome: Progressing  Intervention: Promote Injury-Free Environment  Recent Flowsheet Documentation  Taken 09/17/2022 2000 by Chauncy Passy, RN  Safety Interventions:   commode/urinal/bedpan at bedside   fall reduction program maintained   low bed     Problem: Self-Care Deficit  Goal: Improved Ability to Complete Activities of Daily Living  Outcome: Progressing     Problem: Infection  Goal: Absence of Infection Signs and Symptoms  Outcome: Progressing  Intervention: Prevent or Manage Infection  Recent Flowsheet Documentation  Taken 09/17/2022 2000  by Chauncy Passy, RN  Infection Management: aseptic technique maintained  Isolation Precautions: spec airborne/contact precautions maintained

## 2022-09-19 LAB — BASIC METABOLIC PANEL
ANION GAP: 13 mmol/L (ref 5–14)
BLOOD UREA NITROGEN: 37 mg/dL — ABNORMAL HIGH (ref 9–23)
BUN / CREAT RATIO: 3
CALCIUM: 8.1 mg/dL — ABNORMAL LOW (ref 8.7–10.4)
CHLORIDE: 105 mmol/L (ref 98–107)
CO2: 21 mmol/L (ref 20.0–31.0)
CREATININE: 11.64 mg/dL — ABNORMAL HIGH
EGFR CKD-EPI (2021) FEMALE: 4 mL/min/{1.73_m2} — ABNORMAL LOW (ref >=60)
GLUCOSE RANDOM: 102 mg/dL (ref 70–179)
POTASSIUM: 3 mmol/L — ABNORMAL LOW (ref 3.4–4.8)
SODIUM: 139 mmol/L (ref 135–145)

## 2022-09-19 LAB — CBC
HEMATOCRIT: 34 % (ref 34.0–44.0)
HEMOGLOBIN: 11.2 g/dL — ABNORMAL LOW (ref 11.3–14.9)
MEAN CORPUSCULAR HEMOGLOBIN CONC: 33 g/dL (ref 32.0–36.0)
MEAN CORPUSCULAR HEMOGLOBIN: 29.5 pg (ref 25.9–32.4)
MEAN CORPUSCULAR VOLUME: 89.4 fL (ref 77.6–95.7)
MEAN PLATELET VOLUME: 7.7 fL (ref 6.8–10.7)
PLATELET COUNT: 193 10*9/L (ref 150–450)
RED BLOOD CELL COUNT: 3.8 10*12/L — ABNORMAL LOW (ref 3.95–5.13)
RED CELL DISTRIBUTION WIDTH: 13.6 % (ref 12.2–15.2)
WBC ADJUSTED: 9.9 10*9/L (ref 3.6–11.2)

## 2022-09-19 LAB — MAGNESIUM: MAGNESIUM: 2.6 mg/dL (ref 1.6–2.6)

## 2022-09-19 LAB — PHOSPHORUS: PHOSPHORUS: 4.2 mg/dL (ref 2.4–5.1)

## 2022-09-19 MED ORDER — PANTOPRAZOLE 40 MG TABLET,DELAYED RELEASE
ORAL_TABLET | Freq: Two times a day (BID) | ORAL | 0 refills | 60 days | Status: CP
Start: 2022-09-19 — End: 2022-11-18

## 2022-09-19 MED ADMIN — fluticasone furoate (ARNUITY ELLIPTA) 100 mcg/actuation inhaler 1 puff: 1 | RESPIRATORY_TRACT | @ 15:00:00 | Stop: 2022-09-19

## 2022-09-19 MED ADMIN — dianeal low CA - dextrose 1.5% and calcium 2.5 5,000 mL infusion: INTRAPERITONEAL | @ 02:00:00

## 2022-09-19 MED ADMIN — pantoprazole (Protonix) EC tablet 40 mg: 40 mg | ORAL | @ 02:00:00

## 2022-09-19 MED ADMIN — calcitriol (ROCALTROL) capsule 1 mcg: 1 ug | ORAL | @ 15:00:00 | Stop: 2022-09-19

## 2022-09-19 MED ADMIN — pantoprazole (Protonix) EC tablet 40 mg: 40 mg | ORAL | @ 15:00:00 | Stop: 2022-09-19

## 2022-09-19 MED ADMIN — vitamin B complex-folic acid (BALANCE-B 50) tablet 1 tablet: 1 | ORAL | @ 18:00:00 | Stop: 2022-09-19

## 2022-09-19 MED ADMIN — sevelamer (RENVELA) tablet 800 mg: 800 mg | ORAL | @ 18:00:00 | Stop: 2022-09-19

## 2022-09-19 MED ADMIN — dianeal low CA - dextrose 1.5% and calcium 2.5 2,000 mL infusion: INTRAPERITONEAL | @ 02:00:00

## 2022-09-19 MED ADMIN — potassium chloride (KLOR-CON) packet 40 mEq: 40 meq | ORAL | @ 15:00:00 | Stop: 2022-09-19

## 2022-09-19 MED ADMIN — sevelamer (RENVELA) tablet 800 mg: 800 mg | ORAL | @ 15:00:00 | Stop: 2022-09-19

## 2022-09-19 MED ADMIN — atorvastatin (LIPITOR) tablet 80 mg: 80 mg | ORAL | @ 15:00:00 | Stop: 2022-09-19

## 2022-09-19 NOTE — Unmapped (Signed)
Adult Nutrition Assessment Note    Visit Type: RN Consult  Reason for Visit: Per Admission Nutrition Screen (Adult)    HPI & PMH:  Megan Robertson is a 51 y.o. 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     Anthropometric Data:  Height: 157.5 cm (5' 2)   Admission weight: 83.4 kg (183 lb 13.8 oz)  Last recorded weight: 83.4 kg (183 lb 13.8 oz)  IBW: 49.97 kg  Percent IBW: 166.9 %  BMI: Body mass index is 33.63 kg/m??.   Usual Body Weight:  Patient states she is unsure of her UBW    Weight history prior to admission: Patient states she has not lost a significant amount of weight, maybe 10 lbs (5.2%) in 2 months. No significant weight loss noted per documentation.   Wt Readings from Last 10 Encounters:   09/17/22 83.4 kg (183 lb 13.8 oz)   09/06/22 82.2 kg (181 lb 5.3 oz)   08/22/22 85.7 kg (189 lb)   03/22/22 91.2 kg (201 lb) - Reported   09/12/21 88.5 kg (195 lb)   09/06/21 88.5 kg (195 lb)   08/10/21 87.8 kg (193 lb 9.6 oz)   07/27/21 89.1 kg (196 lb 7 oz)   06/14/21 81.2 kg (179 lb)   05/02/21 81.3 kg (179 lb 3.2 oz)        Weight changes this admission:   Last 5 Recorded Weights    09/17/22 1414   Weight: 83.4 kg (183 lb 13.8 oz)        Nutrition Focused Physical Exam:  Unable to complete at this time due to patient COVID-19 virus      NUTRITIONALLY RELEVANT DATA     Medications:   Nutritionally pertinent medications reviewed and evaluated for potential food and/or medication interactions. Protonix, calcitriol, sevelamer, KCl 40 mEq, Vitamin B complex-folic acid     Labs:   Nutritionally pertinent labs reviewed and include K+: 3.0 mmol/L    Nutrition History:   September 19, 2022: Prior to admission: Patient reports having decreased appetite and eating less for 2 months PTA due to COVID-19 infection. Endorses some nausea and abdominal cramps. Does not endorse vomiting, constipation, diarrhea, chewing/swallowing difficulty. During admission: Patient reports her appetite returning and eating all of her dinner yesterday (ordered cheese burger, mustard, pickles dill chips, coke). Patient has less abdominal cramps and does not endorse nausea.    Allergies, Intolerances, Sensitivities, and/or Cultural/Religious Dietary Restrictions:  egg, peach, mango, egg, fish containing products, mushroom, tomato    Current Nutrition:  Oral intake     Nutrition Orders            Nutrition Therapy Regular/House starting at 11/26 1109            Nutritional Needs:   Healthy balance of carbohydrate, protein, and fat.       Malnutrition assessment not yet completed at this time due to inability to complete nutrition focused physical exam (NFPE).    GOALS and EVALUATION     Patient to consume 75% or greater of po intake via combination of meals, snacks, and/or oral supplements within admission.  - New    Motivation, Barriers, and Compliance:  Evaluation of motivation, barriers, and compliance completed. No concerns identified at this time.  NUTRITION ASSESSMENT     Current nutrition therapy is appropriate and progressing toward meeting nutritional needs at this time.   K (3.0), given KCl repletion     Discharge Planning:   Monitor for potential discharge needs with multi-disciplinary team.     Was the nutrition care plan completed? No, unable to diagnose malnutrition at this time       NUTRITION INTERVENTIONS and RECOMMENDATION     Continue current diet: Regular.  Monitor PO intake and record % eaten in Epic.   Offer oral nutrition supplement as needed.  Vitamin B complex-folic acid.   Monitor electrolytes, replete as appropriate.  Weekly weights.     Follow-Up Parameters:   1-2 times per week (and more frequent as indicated)    Tobie Lords, MPH, RD, LDN  Pager # [phone]

## 2022-09-19 NOTE — Unmapped (Signed)
Nephrology ([facility]) Progress Note    Assessment & Plan:   Megan Robertson is a 51 y.o. 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      Principal Problem:    Sepsis (CMS-HCC)  Active Problems:    Gout    GERD (gastroesophageal reflux disease)    Hypertension    OSA (obstructive sleep apnea)    Hypokalemia    Tachycardia    Anemia in chronic kidney disease    ESRD (end stage renal disease) on dialysis (CMS-HCC)    High serum lactate    Melena    Coffee ground emesis    Nausea    Headache    Blurry vision  Resolved Problems:    * No resolved hospital problems. *      Active Problems    Peritoneal Fluid Infection  Presented with leukocytosis and tachycardia, with additonal symptoms possibly ongoing from covid infection as she denied any time where symptoms truly improved. No abdominal distension or tenderness on exam throughout admission. Chest XR showed no acute abnormalities and chest CT was without evidence of PE. Blood cultures have remained negative to date and WBC count has normalized. 11/28 peritoneal fluid culture resulted positive for gram positive cocci. S/p cefepime & vancomycin   - Cancel plans for discharge to home today   - Continue admission overnight   - Repeat peritoneal fluid studies: cell count & culture   - F/up blood cultures      Headaches - Blurry Vision  Blurry vision only occurred in context of headaches. Does have a history of migraines. May be having headaches in the context of illness as above.   - Continue monitoring for symptoms   - Symptomatic treatment      Reported Melena & Coffee Ground Emesis - AoCKD  Symptoms occurred two days prior to admission and have not recurred. Hgb stable throughout admission and patient remained hemodynamically stable. No current concern for active bleed. S/p IV pantoprazole, now tolerating orals.   - monitor CBC   - Pantoprazole 40mg  PO BID     ESRD on PD  Deferred initiating PD overnight as no indications for acute dialysis. Resume normal schedule today.   - Nightly PD   - Daily BMP  - Continue sevelamer 800mg  TID   - Continue calcitriol daily   - Continue cinacalcet 90mg  daily     Hypokalemia  Pt with chronically low potassium levels in the past, as well as during this admission. S/p oral replacement.  - BMP daily   - At discharge, start K once daily for two weeks and recheck outpatient to ensure stable K      Chronic Problems     Psoriasis: On Humira, follows with [facility]. Holding humira.  HTN: Continue amlodipine, imdur   Hx Gout: Continue allopurinol       Daily Checklist:  Diet: Regular Diet  DVT PPx: Contraindicated 2/2 concern for active bleed on admission.  Electrolytes: Potassium and Magnesium Repleted  Code Status: Full Code  Dispo:  Plan to discharge to home tomorrow Wed 11/29    Team Contact Information:   Primary Team: Nephrology ([facility])  Primary Resident: Alvino Chapel, MD, MD  Resident's Pager: [phone] (Nephrology Intern - Tower)    Interval History:  Initially planning for discharge to home today. Med rec signed and DC summary written. Then received phone call from microbiology lab alerting of positive peritoneal fluid culture results. Will keep patient overnight to obtain additional studies and plan for later discharge once appropriate. Patient agreeable to plan.      Objective:   Temp:  [36.7 ??C (98.1 ??F)-37 ??C (98.6 ??F)] 37 ??C (98.6 ??F)  Heart Rate:  [90-97] 90  Resp:  [16-18] 18  BP: (108-116)/(53-63) 110/59  SpO2:  [95 %-97 %] 97 %    Gen: NAD, converses easily   HENT: atraumatic, normocephalic  Heart: RRR  Lungs: CTAB, no crackles or wheezes  Abdomen: soft, NTND  Extremities: No edema     ________________________________  Alvino Chapel, MD   September 18, 2022 8:54 PM

## 2022-09-19 NOTE — Unmapped (Signed)
PEDIATRIC PERITONEAL DIALYSIS TREATMENT NOTE    PROCEDURE DATE/TIME:    09/19/22 3:00 AM PD THERAPY DAY:  3 PD DEVICE:   ([place])     THERAPY TYPE:  Continuous Cycling Peritoneal Dialysis - Standard     CONSENT:    Written consent was obtained prior to the procedure and is detailed in the medical record. Prior to the start of the procedure, a time out was taken and the identity of the patient was confirmed via name, medical record number and date of birth.    Active Dialysis Orders (168h ago, onward)       Start     Ordered    09/16/22 1928  Peritoneal Dialysis - CCPD Standard  Daily      Question Answer Comment   Therapy Time (hours) 8 hours    Total Number of Cycles Other (Please specify) 3   Exchange Volume (L) 2L    Day dwell/Last fill (mL) 0    Dialysate last fill with bag as ordered        09/16/22 1927                    VITAL SIGNS:  Vitals:    09/18/22 2125   BP: 103/54   Pulse: 91   Resp: 18   Temp: 36.5 ??C (97.7 ??F)   SpO2: 96%    Vitals:    09/17/22 1414   Weight: 83.4 kg (183 lb 13.8 oz)        LAB RESULTS:    Potassium   Date Value Ref Range Status   09/18/2022 3.4 3.4 - 4.8 mmol/L Final       DIALYSATE FLUID:  Dianeal Solution: Dextrose 1.5% Calcium 2.5 mEq/L Last Fill Dianeal Solution:  (none)   Additives:  None    ACCESS:  Peritoneal Dialysis Catheter Continuous cycling Left lower abdomen (Active)   Site Assessment Clean;Dry;Intact 09/18/22 2141   Dressing Occlusive 09/18/22 2141   Dressing Status      Clean;Dry;Intact/not removed 09/18/22 2141   Dressing Change Due 09/23/22 09/18/22 2141   Dressing Intervention No intervention needed 09/18/22 2141   Status Accessed 09/18/22 2141   PD Catheter Transfer Set [employer] Connector 09/18/22 2141     Patient Lines/Drains/Airways Status       Active Peripheral & Central Intravenous Access       Name Placement date Placement time Site Days    Peripheral IV 09/16/22 Anterior;Right Forearm 09/16/22  2327  Forearm  2                    SETTINGS:  Mode: Standard Minimum Drain Volume (%): 85%   Smart Dwells: Yes Heater Bag Empty: No   Tidal Full Drains: Yes Flush: Yes   Program Locked: No I-Drain Alarm (mL): 0 mL   Program Verfied: Yes Lines Unclamped:  Yes.     PEDIATRIC LOW FILL:                INITIAL DRAIN:    Inital Outflow Effluent Appearance: Amber Initial Drain Volume (mL): 83 mL     THERAPY DETAILS:  Peritoneal Dialysis Fill Volume (ml): 2000 ml Peritoneal Dialysis Total Volume (ml): 6000 ml   Average Dwell Time (Minutes): 137 Minutes (added dwell: 6 mins.) Effluent Appearance: Clear, Light, Yellow     EXCHANGE NET BALANCE:  PD Net Exchange Intake (mL): 11 ml           DIALYSIS ON-CALL NURSE PAGER NUMBER:  Monday thru Friday 0700 - 1730: Call the Dialysis Unit ext. [phone]   After 1730 and all day Sunday: Call the Dialysis RN Pager Number [phone]     PROCEDURE REVIEW, VERIFICATION, HANDOFF:  PD settings verified, procedure reviewed, and instructions given to primary RN.  Dialysis RN Verifying: Arelia Sneddon Sofiya Ezelle Primary PD RN Verifying: Ellen Henri RN

## 2022-09-19 NOTE — Unmapped (Incomplete)
Problem: Adult Inpatient Plan of Care  Goal: Plan of Care Review  Outcome: Ongoing - Unchanged  Goal: Patient-Specific Goal (Individualized)  Outcome: Ongoing - Unchanged  Goal: Absence of Hospital-Acquired Illness or Injury  Outcome: Ongoing - Unchanged  Intervention: Identify and Manage Fall Risk  Recent Flowsheet Documentation  Taken 09/18/2022 2000 by Delsa Bern, RN  Safety Interventions:   fall reduction program maintained   lighting adjusted for tasks/safety   low bed   nonskid shoes/slippers when out of bed  Intervention: Prevent Skin Injury  Recent Flowsheet Documentation  Taken 09/18/2022 2000 by Delsa Bern, RN  Positioning for Skin: Right  Device Skin Pressure Protection: absorbent pad utilized/changed  Skin Protection: adhesive use limited  Intervention: Prevent and Manage VTE (Venous Thromboembolism) Risk  Recent Flowsheet Documentation  Taken 09/19/2022 0600 by Delsa Bern, RN  Anti-Embolism Device Location: [facility]  Taken 09/19/2022 0400 by Delsa Bern, RN  Anti-Embolism Device Location: [facility]  Taken 09/19/2022 0200 by Delsa Bern, RN  Anti-Embolism Device Location: [facility]  Taken 09/19/2022 0000 by Delsa Bern, RN  Anti-Embolism Device Location: [facility]  Taken 09/18/2022 2200 by Delsa Bern, RN  Anti-Embolism Device Location: [facility]  Taken 09/18/2022 2000 by Delsa Bern, RN  VTE Prevention/Management: ambulation promoted  Anti-Embolism Device Location: [facility]  Intervention: Prevent Infection  Recent Flowsheet Documentation  Taken 09/18/2022 2000 by Delsa Bern, RN  Infection Prevention: hand hygiene promoted  Goal: Optimal Comfort and Wellbeing  Outcome: Ongoing - Unchanged  Goal: Readiness for Transition of Care  Outcome: Ongoing - Unchanged  Goal: Rounds/Family Conference  Outcome: Ongoing - Unchanged     Problem: Wound  Goal: Optimal Coping  Outcome: Ongoing - Unchanged  Goal: Optimal Functional Ability  Outcome: Ongoing - Unchanged  Intervention: Optimize Functional Ability  Recent Flowsheet Documentation  Taken 09/18/2022 2000 by Delsa Bern, RN  Activity Management: up ad lib  Goal: Absence of Infection Signs and Symptoms  Outcome: Ongoing - Unchanged  Intervention: Prevent or Manage Infection  Recent Flowsheet Documentation  Taken 09/18/2022 2000 by Delsa Bern, RN  Infection Management: aseptic technique maintained  Isolation Precautions: spec airborne/contact precautions maintained  Goal: Improved Oral Intake  Outcome: Ongoing - Unchanged  Goal: Optimal Pain Control and Function  Outcome: Ongoing - Unchanged  Intervention: Prevent or Manage Pain  Recent Flowsheet Documentation  Taken 09/18/2022 2000 by Delsa Bern, RN  Sleep/Rest Enhancement: awakenings minimized  Goal: Skin Health and Integrity  Outcome: Ongoing - Unchanged  Intervention: Optimize Skin Protection  Recent Flowsheet Documentation  Taken 09/18/2022 2000 by Delsa Bern, RN  Activity Management: up ad lib  Pressure Reduction Techniques: frequent weight shift encouraged  Head of Bed (HOB) Positioning: HOB at 30-45 degrees  Pressure Reduction Devices: positioning supports utilized  Skin Protection: adhesive use limited  Goal: Optimal Wound Healing  Outcome: Ongoing - Unchanged  Intervention: Promote Wound Healing  Recent Flowsheet Documentation  Taken 09/18/2022 2000 by Delsa Bern, RN  Sleep/Rest Enhancement: awakenings minimized     Problem: Comorbidity Management  Goal: Maintenance of Asthma Control  Outcome: Ongoing - Unchanged  Goal: Maintenance of Behavioral Health Symptom Control  Outcome: Ongoing - Unchanged  Goal: Maintenance of COPD Symptom Control  Outcome: Ongoing - Unchanged  Goal: Blood Glucose Levels Within Targeted Range  Outcome: Ongoing - Unchanged  Goal: Maintenance of Heart Failure Symptom Control  Outcome: Ongoing - Unchanged  Goal: Blood Pressure in Desired Range  Outcome: Ongoing -  Unchanged  Goal: Maintenance of Osteoarthritis Symptom Control  Outcome: Ongoing - Unchanged  Intervention: Maintain Osteoarthritis Symptom Control  Recent Flowsheet Documentation  Taken 09/18/2022 2000 by Delsa Bern, RN  Activity Management: up ad lib  Goal: Bariatric Home Regimen Maintained  Outcome: Ongoing - Unchanged  Goal: Maintenance of Seizure Control  Outcome: Ongoing - Unchanged     Problem: Fall Injury Risk  Goal: Absence of Fall and Fall-Related Injury  Outcome: Ongoing - Unchanged  Intervention: Identify and Manage Contributors  Recent Flowsheet Documentation  Taken 09/18/2022 2000 by Delsa Bern, RN  Self-Care Promotion: independence encouraged  Intervention: Promote Injury-Free Environment  Recent Flowsheet Documentation  Taken 09/18/2022 2000 by Delsa Bern, RN  Safety Interventions:   fall reduction program maintained   lighting adjusted for tasks/safety   low bed   nonskid shoes/slippers when out of bed     Problem: Infection  Goal: Absence of Infection Signs and Symptoms  Outcome: Ongoing - Unchanged  Intervention: Prevent or Manage Infection  Recent Flowsheet Documentation  Taken 09/18/2022 2000 by Delsa Bern, RN  Infection Management: aseptic technique maintained  Isolation Precautions: spec airborne/contact precautions maintained

## 2022-09-21 NOTE — Unmapped (Signed)
Called patient to discuss discharge process and confirm outpatient follow-up. Patient had no acute needs.    Ronnell Guadalajara  MS3, [facility]  09/21/2022

## 2022-10-10 DIAGNOSIS — L409 Psoriasis, unspecified: Principal | ICD-10-CM

## 2022-10-10 MED ORDER — ADALIMUMAB PEN CITRATE FREE 40 MG/0.4 ML
SUBCUTANEOUS | 3 refills | 84 days
Start: 2022-10-10 — End: ?

## 2022-10-10 MED ORDER — AMLODIPINE 10 MG TABLET
ORAL_TABLET | Freq: Every day | ORAL | 0 refills | 90 days
Start: 2022-10-10 — End: 2023-01-08

## 2022-10-10 NOTE — Unmapped (Signed)
The [facility] has made a second and final attempt to reach this patient to refill the following medication:Humira.      We have left voicemails on the following phone numbers: [phone] and have sent a text message to the following phone numbers: [phone] .    Dates contacted: 11/16 and 11/20  Last scheduled delivery: 08/14/22    The patient may be at risk of non-compliance with this medication. The patient should call the [facility] at [phone]  Option 4, then Option 2 (all other specialty patients) to refill medication.    Unk Lightning   [facility] Specialty Technician

## 2022-10-10 NOTE — Unmapped (Signed)
Patient was last seen in the clinic on 03/21/2022 and has not yet arranged an appointment with the clinic. Please advise.

## 2022-10-11 MED ORDER — ADALIMUMAB PEN CITRATE FREE 40 MG/0.4 ML
SUBCUTANEOUS | 0 refills | 84.00000 days | Status: CP
Start: 2022-10-11 — End: 2023-01-09

## 2022-10-11 NOTE — Unmapped (Signed)
Refilled humira. Please schedule f/up for future refills. Patient due for appt and repeat quant gold. Thanks!

## 2022-10-11 NOTE — Unmapped (Signed)
Called, no answer  MyChart message sent    Thanks,  Courtney

## 2022-10-11 NOTE — Unmapped (Signed)
Please contact patient to schedule appointment.

## 2022-10-16 DIAGNOSIS — E785 Hyperlipidemia, unspecified: Principal | ICD-10-CM

## 2022-10-16 DIAGNOSIS — L409 Psoriasis, unspecified: Principal | ICD-10-CM

## 2022-10-19 ENCOUNTER — Ambulatory Visit: Admit: 2022-10-19 | Discharge: 2022-10-20 | Payer: MEDICARE

## 2022-10-19 DIAGNOSIS — M792 Neuralgia and neuritis, unspecified: Principal | ICD-10-CM

## 2022-10-19 MED ORDER — TIZANIDINE 2 MG TABLET
ORAL_TABLET | Freq: Once | ORAL | 0 refills | 0 days | Status: CP | PRN
Start: 2022-10-19 — End: ?

## 2022-10-19 MED ORDER — NALTREXONE 1.5 MG CAPSULE
ORAL_CAPSULE | Freq: Every evening | ORAL | 2 refills | 0 days | Status: CN
Start: 2022-10-19 — End: ?

## 2022-10-19 MED ORDER — NALTREXONE 4.5 MG CAPSULE
ORAL_CAPSULE | Freq: Every evening | ORAL | 2 refills | 0 days | Status: CP
Start: 2022-10-19 — End: ?

## 2022-10-19 NOTE — Unmapped (Signed)
Department of Anesthesiology  [facility]  [address]  [place], [place] [address]  [phone]      Date: October 19, 2022  Patient Name: Megan Robertson  MRN: [id number]  PCP: Megan Robertson  Referring Provider: Sharlett Iles, MD    Assessment and Plan   Attending: Kiylah Robertson is a 51 y.o. 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 She was seen at our clinic in 2020 for SI joint-related pain, and was re-referred to our clinic by her nephrologist Dr. Stefano Robertson for chronic left upper extremity hand pain refractory to medical management including gabapentin and duloxetine.     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   >Started after AVF surgery near axilla, ongoing burning, numb, tingling pain, constant, associated with weakness, tightness, cold sensation.   >Refractory to gabapentin, duloxetine;  Alleviated by tylenol, tizanidine, and oxycodone without adverse effects  >Unable to tolerate PT  >Exam of LUE: Sensation light touch and pin prick sensation is impaired from the mid upper arm down 3/5 strength, poor grip strength of the left hand, contracted left hand, tight skin, no palpable thrills in fistula in the lower and middle arm, scar tissue noted on the medial aspect of the left upper arm and neck near the clavicle. Negative Spurling bilaterally.   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 - Diminished radial and ulnar artery waveforms and diminished amplitude in fingers in one and four PPG traciings; Venous Doppler 03/2022: no evidence of DVT  >Labs: 09/19/2022 CrCl <15 ml/min  >Possible etiologies of pain generation include: brachial plexopathy, peripheral nerve entrapment, vascular insufficiency, and CRPS    Referrals:  - Placed referral to Pain Psychology  - Placed referral to Neurosurgery (Megan Robertson) to evaluate for possible brachial plexopathy and surgical options to restore function in LUE   - Consider placing referral to PT if patient desires (unable to tolerate sessions previously); encouraged home exercise program and focus on mobility of LUE    Medication and Therapeutic Management:  - Continue Tylenol 650 mg oral tablet every 6 hours as needed   - Continue tizanidine 2 mg oral tablet daily as needed, refilled  - Discontinue oxycodone 5 mg oral tablet daily as needed  - Start LDN 4.5 mg oral capsule daily   - Ordered TENS Unit for myofascial pain     Future Considerations:  -Consider TPIs  -Consider topical ketamine cream   -EMG/NCS    -Return in about 3 months (around 01/18/2023).    Orders Placed This Encounter   Procedures    Ambulatory referral to Pain Psychology     Standing Status:   Future     Standing Expiration Date:   10/20/2023     Referral Priority:   Routine     Referral Type:   Generic Referral     Number of Visits Requested:   1    Ambulatory referral to Neurosurgery     Standing Status:   Future     Standing Expiration Date:   10/20/2023     Referral Priority:   Routine     Referral Type:   Generic Referral     Referral Reason:   Specialty Services Required     Referred  to Provider:   Ermalinda Barrios, MD     Number of Visits Requested:   1     Requested Prescriptions     Signed Prescriptions Disp Refills    tizanidine (ZANAFLEX) 2 MG tablet 30 tablet 0     Sig: Take 1 tablet (2 mg total) by mouth once as needed.    naltrexone 4.5 mg capsule 30 capsule 2     Sig: Take 1 capsule by mouth nightly.       Patient does  appear to be utilizing pain medications appropriately and does  report that the medications do improve patient's quality of life and functionality level.  At today's visit, the patient reports poor analgesia from their current medication regimen without significant adverse effects.    Current Pain Provider: yes  Urine toxicology: None  Urine toxicology screen N/A appropriate.   Last Opioid Change: N/A  Last EKG: N/A  Previous Compliance Issues: None  Naloxone ordered: N/A  Total morphine equivalents:   Benzodiazepine: No.  Pain Psychology: N/A and Consider referral in the future  [place] DOC: None  [place]CCS[place] database was reviewed 10/19/22.      1. Neuropathic pain      Subjective:     HPI:  [name] is seen in consultation at the request of [name], [name], MD  For evaluation and recommendations regarding Her chronic pain.     Today, the patient presents reporting pain in her left arm to the extent of her fingers. The patient describes the pain as numbness, throbbing, and stabbing. She states she had vascular surgery in her left arm and notes she did not have successful surgeries. The patients partner notes she had blood clots in her left arm. She reports difficulty with ROM and notes difficulty with using her fingers. Her partners notes he massages her left arm for her. She states that her ROM has increased some, though is not at goal. The patient reports completing PT for her left arm without benefit. She reports having numbness and tingling from her left bicep to her forearm. The patient reports intermittently her fingers will lock up and she will have to massage them before they start working again. The patient reports temperature changes as well as color changes. She reports tight skin and changes in sensation of her left arm. The patient reports dropping items with her left hand due to difficulty gripping. The patient denies having EMG in the past. She confirms doing dialysis nightly. She reports having a nodule on her thyroid. The patient denies seeing a Psychologist.     In terms of medications, the patient reports taking Eliquis and heparin. She reports taking tizanidine 2 mg prn, Tylenol 1000 mg prn, and oxycodone 5 mg prn. She reports taking oxycodone sparingly when pain is very bad. The patient states she takes Tylenol 2000 mg before taking oxycodone. She states she was given tizanidine and her husband notes sedation. She reports trying gabapentin and Cymbalta in the past without benefit.     Pain Clinic? No    Current Medications:  Tylenol 1000 mg prn  Oxycodone 5 mg prn  Tizanidine 2 mg prn     Current view: Showing all answers           [facility] New Patient Questionnaire       Question 10/18/2022 10:49 PM EST - Filed by Patient    Primary Care Physician Name: Bethel Born    Referring Physician Name:  Reason for today's visit: Pain in left arm    Date of onset of your pain:       What started your pain?       What questions would you like to ask your doctor today? Why is I'm still having pain, cramping up numbness and have limited use of left arm    Please circle the location of your pain.       Please select the words that describe your pain. Aching     Dull     Painful Cold     Sharp     shooting     Sore     Stabbing Tender Throbbing     Other    Please rate your pain at its WORST in the past month. 10    Please rate your pain at its LEAST in the past month. 8    Please rate your pain on AVERAGE in the past month. 10    How did your pain start?       Do you have any of the following? Numbness     Weakness     Tingling     Stiffness    Have you had any loss of bowel of bladder control? Yes    What worsens your problem?       How often do you have pain? All the time    What helps your pain? Medication     Massage    Have you tried any of the following for your pain? Physical Therapy    Have you had any of the following tests for your current problem within the past 7 years? X-Rays    Will your visit involve Workman's Compensation? No    If this is the result of an accident/injury, are you involved in a lawsuit or have a lawyer? No    Please select the phrase(s) which best reflects your treatment goals. Complete resolution of my pain with medications if necessary    Is there anything else we should know about your pain, goals, or your feelings about pain management? N/a    What medications have you tried in the past for your pain? Tylenol,muscle relaxer, oxycodone    Please list all current medications you are taking:       General:       Head, Ears, Eyes, Nose and Throat: Hearing Problems     Earaches    Skin: Rash     Itching     Dryness    Pulmonary - (Lungs): Shortness of Breath     Stop Breathing During Sleep    Endocrine (Hormonal System):       Gastrointestinal - (Intestinal): Diarrhea     Gastric Reflux    Genitourinary - (Kidneys and Bladder):       Musculoskeletal System - (Muscles, Joints and Coverings): Back pain    Cardiovascular: Heart Palpitations     Shortness of Breath with Exertion     Difficulty Breathing when Laying Flat    Neurologic:       Psychiatric: Depression     Anxiety     Low Energy     Anger     Emotional Outbursts     Lack of interest in activities    Blood System: Blood Clots    Immune System: Allergies    Social History:     What is your marital status? Married    How many children do you have? 2 a  daughter 70 yr old and a son 70 yr old    What are your living arrangements? Living with spouse/partner    What is your highest level of education? High School    What is your employment status? Disabled    Occupation       What do you do for exercise? Nothing    How often do you exercise? I don't    Have you ever used tobacco products? Yes    How often do you drink alcohol? Don't drink or use tobacco    Have you ever used recreational drugs? No    Have you ever been in treatment for any kind of substance abuse (alcohol or any kind of drugs)?   No    Do you currently use recreational drugs?  No    Do you currently use another person's prescription medications? No    Have you ever been in treatment for drug or alcohol dependency? No             Previous Responses        Op Gct Opt Outs       Question 03/17/2022 5:59 PM EST - Filed by Patient    Would you like to be excluded from the patient list? No    Would you like for [place] to withhold information about you from your family and friends? No    Would you like for [place] to withhold information about you from community clergy? No          Previous interventions include PT    Previous tests include XRAYs  Workmans Compensation is not involved.  This is not involved with a lawsuit or lawyer  The treatment goals include Complete resolution with medications if necessary    Previous Medication Trials: Oxycodone and Tizanidine/Zanaflex and Tylenol     Past Medical History:   Diagnosis Date    Abnormal mammogram     Abnormal Pap smear of cervix     Allergic     Anemia     Anxiety     Arthritis     Asthma     Back pain     Cancer (CMS-HCC) Ovarian    Depression     Diabetes mellitus (CMS-HCC) Borderline    Eczema     ESRD (end stage renal disease) on dialysis (CMS-HCC)     FSGS (focal segmental glomerulosclerosis) 1997    renal biopsy at [facility]    GERD (gastroesophageal reflux disease)     Gout     H/O adenoidectomy     had adenoids removed    Hypercholesteremia     Hypertension     Keloid     Ovarian cancer (CMS-HCC)     Pancreatitis     Psoriasis     Seizure (CMS-HCC)     unknown etiology; none for several years    Stroke (CMS-HCC) 2000     Past Surgical History:   Procedure Laterality Date    APPENDECTOMY      BREAST EXCISIONAL BIOPSY Right 11/28/2016    CHEMOTHERAPY      for ovarian CA in 2000    CHG US GUIDE, VASCULAR ACCESS N/A 01/29/2020    Procedure: ULTRASOUND GUIDANCE FOR VASC ACCESS REQUIRING [place] EVAL OF POTENTIAL ACCESS SITES;  Surgeon: Leona Carry, MD;  Location: MA[facility];  Service: Transplant    CHG US GUIDE, VASCULAR ACCESS N/A 06/08/2020    Procedure: ULTRASOUND GUIDANCE FOR VASC ACCESS REQUIRING [place] EVAL  OF POTENTIAL ACCESS SITES;  Surgeon: Leona Carry, MD;  Location: MAIN OR [facility];  Service: Transplant    CHOLECYSTECTOMY      COMBINED HYSTEROSCOPY DIAGNOSTIC / D&C  07/07/2015    Planned for endometrial ablation, but patient had uterine perforation after D&C and unable to perform    HYSTERECTOMY      LEFT OOPHORECTOMY Left 04/12/2000    Removed with L fallopian tube for ectopic pregnancy    OOPHORECTOMY      PR COLONOSCOPY W/BIOPSY SINGLE/MULTIPLE  09/12/2021    Procedure: COLONOSCOPY, FLEXIBLE, PROXIMAL TO SPLENIC FLEXURE; WITH BIOPSY, SINGLE OR MULTIPLE;  Surgeon: Maris Berger, MD;  Location: [facility]URES [facility];  Service: Gastroenterology    PR COLSC FLX W/RMVL OF TUMOR POLYP LESION SNARE TQ N/A 09/12/2021    Procedure: COLONOSCOPY FLEX; W/REMOV TUMOR/LES BY SNARE;  Surgeon: Maris Berger, MD;  Location: [facility]URES [facility];  Service: Gastroenterology    PR CREAT AV FISTULA,AUTOGENOUS GRAFT Left 01/29/2020    Procedure: Create Av Fistula (Separt Proc); Autog Gft;  Surgeon: Leona Carry, MD;  Location: MAIN OR [facility];  Service: Transplant    PR CREAT AV FISTULA,NON-AUTOGENOUS GRAFT Left 06/08/2020    Procedure: CREATE AV FISTULA (SEPARATE PROC); NONAUTOGENOUS GRAFT (EG, BIOLOGICAL COLLAGEN, THERMOPLASTIC GRAFT);  Surgeon: Leona Carry, MD;  Location: MAIN OR [facility];  Service: Transplant    PR EXPLORATION N/FLWD SURG UPPER EXTREMITY ARTERY Left 08/02/2020    Procedure: Exploration Not Followed By Surgical Repair, Artery; Upper Extremity (Eg, Axillary, Brachial, Radial, Ulnar);  Surgeon: Earney Mallet, MD;  Location: MAIN OR [facility];  Service: Vascular    PR INCIS/DRAIN ARM,DEEP ABSC/HEMATOMA Left 08/14/2020    Procedure: INCISION AND DRAINAGE, UPPER ARM OR ELBOW AREA; DEEP ABSCESS OR HEMATOMA;  Surgeon: Earney Mallet, MD;  Location: MAIN OR [facility];  Service: Vascular    PR INSERTION TUNNEL INTRAPERITONEAL CATH DIAL OPEN Midline 02/01/2021    Procedure: INSERTION OF INTRAPERITONEAL CANNULA OR CATHETER FOR DRAINAGE OR DIALYSIS; PERMANENT;  Surgeon: Loney Hering, MD;  Location: MAIN OR ; Service: Transplant    PR REMV ART CLOT AXILL-BRACH,ARM INCIS Left 08/02/2020    Procedure: Embolect/Thrombec; Axilry/Brachial Art-Arm Incs;  Surgeon: Earney Mallet, MD;  Location: MAIN OR [facility];  Service: Vascular    PR UPPER GI ENDOSCOPY,BIOPSY N/A 08/29/2017    Procedure: UGI ENDOSCOPY; WITH BIOPSY, SINGLE OR MULTIPLE;  Surgeon: Neysa Hotter, MD;  Location: GI PROCEDURES [facility];  Service: Gastroenterology    PR VEIN BYPASS GRAFT,SUBCL-BRACHIAL Left 08/05/2020    Procedure: Bypass Graft, With Vein; Subclavian-Brachial;  Surgeon: Earney Mallet, MD;  Location: MAIN OR [facility];  Service: Vascular    SALPINGECTOMY Left 04/12/2000    Ectopic pregnancy    SALPINGECTOMY Right 11/22/2015    at time of total vaginal hysterectomy    TONSILECTOMY, ADENOIDECTOMY, BILATERAL MYRINGOTOMY AND TUBES      TOTAL VAGINAL HYSTERECTOMY  11/22/2015    With R salpingectomy     Family History   Adopted: Yes   Problem Relation Age of Onset    Heart attack Mother 61    Alcohol abuse Mother     Mental illness Mother     No Known Problems Father     No Known Problems Sister     No Known Problems Daughter     No Known Problems Maternal Grandmother     No Known Problems Maternal Grandfather     No Known Problems Paternal Grandmother  No Known Problems Paternal Grandfather     No Known Problems Brother     No Known Problems Son     No Known Problems Maternal Aunt     No Known Problems Maternal Uncle     No Known Problems Paternal Aunt     No Known Problems Paternal Uncle     No Known Problems Other     Anesthesia problems Neg Hx     Broken bones Neg Hx     Cancer Neg Hx     Clotting disorder Neg Hx     Collagen disease Neg Hx     Diabetes Neg Hx     Dislocations Neg Hx     Fibromyalgia Neg Hx     Gout Neg Hx     Hemophilia Neg Hx     Osteoporosis Neg Hx     Rheumatologic disease Neg Hx     Scoliosis Neg Hx     Severe sprains Neg Hx     Sickle cell anemia Neg Hx     Spinal Compression Fracture Neg Hx Melanoma Neg Hx     Basal cell carcinoma Neg Hx     Squamous cell carcinoma Neg Hx     Breast cancer Neg Hx        Social History:  She reports that she quit smoking about 12 years ago. Her smoking use included cigarettes. She started smoking about 13 years ago. She has a 2.0 pack-year smoking history. She has never used smokeless tobacco. She reports that she does not currently use alcohol. She reports that she does not currently use drugs.      Allergies as of 10/19/2022 - Reviewed 10/19/2022   Allergen Reaction Noted    Cephalexin Itching 02/22/2016    Mango Itching 01/17/2017    Peach (prunus persica) Itching 10/09/2017    Wellbutrin [bupropion hcl] Other (See Comments) 10/03/2016    Egg Itching 10/03/2016    Fish containing products Itching 10/03/2016    Lactase-rennet Nausea And Vomiting 02/25/2020    Lexiscan [regadenoson] Nausea And Vomiting 12/28/2020    Mushroom Itching 10/03/2016    Tomato (solanum lycopersicum) Itching 10/03/2016      Current Outpatient Medications   Medication Sig Dispense Refill    adalimumab 80 mg/0.8 mL-40 mg/0.4 mL PnKt Inject the contents of 1 pen (80mg ) under the skin for one dose, THEN 1 pen (40mg ) every 14 days thereafter. 3 each 0    ADALIMUMAB PEN CITRATE FREE 40 MG/0.4 ML Inject the contents of 1 pen (40 mg total) under the skin every fourteen (14) days. 6 each 0    allopurinoL (ZYLOPRIM) 100 MG tablet Take 1 tablet (100 mg total) by mouth Every Tuesday, Thursday and Saturday. 36 tablet 3    amLODIPine (NORVASC) 10 MG tablet Take 1 tablet (10 mg total) by mouth daily. 90 tablet 0    apixaban (ELIQUIS) 5 mg Tab Take 1 tablet (5 mg total) by mouth in the morning. 30 tablet 0    aspirin 81 MG chewable tablet Chew 1 tablet (81 mg total) daily. 30 tablet 1    atorvastatin (LIPITOR) 80 MG tablet Take 1 tablet (80 mg total) by mouth daily. 30 tablet 0    beclomethasone dipropionate (QVAR REDIHALER) 80 mcg/actuation inhaler 1 puff every morning, 2 puffs every evening (Patient taking differently: continuous as needed. 1 puff every morning, 2 puffs every evening as needed) 10.6 g 0    calcitriol (ROCALTROL) 0.5 MCG capsule Take 2 capsules (  1 mcg total) by mouth daily. 180 capsule 3    evolocumab (REPATHA SURECLICK) 140 mg/mL PnIj Inject the contents of 1 pen (140 mg) under the skin every fourteen (14) days. 6 mL 3    fluocinolone 0.01 % external oil Apply topically daily as needed. Use for psoriasis and scalp scaling. 120 mL 5    furosemide (LASIX) 40 MG tablet Take 1 tablet (40 mg total) by mouth 3 (three) times a week. To be taken once a day if you develop leg swelling. 36 tablet 3    isosorbide mononitrate (IMDUR) 120 MG 24 hr tablet Take 1 tablet (120 mg total) by mouth daily. 30 tablet 0    ondansetron (ZOFRAN) 4 MG tablet Take 1 tablet (4 mg total) by mouth every six (6) hours for 3 days. 12 tablet 0    pantoprazole (PROTONIX) 40 MG tablet Take 1 tablet (40 mg total) by mouth two (2) times a day. 120 tablet 0    sevelamer (RENVELA) 800 mg tablet Take 1 tablet (800 mg total) by mouth.      naltrexone 4.5 mg capsule Take 1 capsule by mouth nightly. 30 capsule 2    tizanidine (ZANAFLEX) 2 MG tablet Take 1 tablet (2 mg total) by mouth once as needed. 30 tablet 0     No current facility-administered medications for this visit.       Imaging/Tests:     Lab Results   Component Value Date    CREATININE 11.64 (H) 09/19/2022       Lab Results   Component Value Date    ALKPHOS 117 (H) 09/15/2022    BILITOT 0.5 09/15/2022    BILIDIR 0.30 08/11/2020    PROT 7.9 09/15/2022    ALBUMIN 3.8 09/15/2022    ALT 8 (L) 09/15/2022    AST 14 09/15/2022       Lab Results   Component Value Date    PLT 193 09/19/2022       Urine toxiciology screen  No results found for: AMPHU, BARBU, BENZU, CANNAU, METHU, OPIAU, COCAU    OPIOID CONFIRMATION:  No results found for: CDIFFTOX, NBUPR, LABCO, HYDROCODONE, HYDROMORPH, MORPHINE, OXYCODONE, OXMU, MAMU, OPIU     BENZODIAZEPINE CONFIRMATION:  No results found for: CDIFFTOX, OHALP, CLONU, HDXYFLRAZUR, 7NHFLU, DESALKYCONF, LORAZURQT, HDXYTRIAZUR, MIDAZURQT, BNZU    Review of Systems:  See questionnaire above.      She denies any homicidal or suicidal ideation.         Objective:     PHYSICAL EXAM:  BP 109/77  - Pulse 102  - Temp 36.2 ??C (97.2 ??F) (Skin)  - Resp 16  - Ht 157.5 cm (5' 2)  - Wt 83.9 kg (185 lb)  - LMP  (LMP Unknown)  - SpO2 99%  - BMI 33.84 kg/m??   Wt Readings from Last 3 Encounters:   10/19/22 83.9 kg (185 lb)   09/17/22 83.4 kg (183 lb 13.8 oz)   09/06/22 82.2 kg (181 lb 5.3 oz)     GENERAL: Well developed, well-nourished female and is in no apparent distress. The patient is pleasant and interactive. Patient is a good historian.  No evidence of sedation or intoxication.   HEENT: Normocephalic/atraumatic.   CARDIOVASCULAR:  Warm, well perfused  RESPIRATORY:  Normal work of breathing, no supplemental 02  EXTREMITIES: No clubbing, cyanosis noted.    GASTROINTESTINAL: Soft, nondistended  NEUROLOGIC:  Alert and oriented, speech fluent, normal language. Sensation Light touch sensation is impaired in LUE from the  mid upper arm down... Pinprick sensation is impaired in LUE from the mid upper arm down..  MUSCULOSKELETAL:  LUE strength 3/5, all other extremities 5/5 strength. Poor grip strength of the left hand, contracted left hand, tight skin, no palpable thrills in fistula in the lower and middle arm. Scar tissue noted on the medial aspect of the left upper arm and neck near the clavicle. Patient rises from a seated position with no difficulty.  The patient was able to ambulate with no difficulty throughout the clinic today without the assistance of a walking aid-None.     SKIN: No obvious rashes lesions or erythema on the exposed skin  PSYCHIATRIC: Appropriate, full range affect, no psychomotor retardation    Documentation assistance was provided by Martie Lee, on October 19, 2022 at 1:03 PM for Dr. Marcelle Smiling, MD.    Marcelle Smiling, MD  Pain Medicine Fellow, PGY-5

## 2022-10-19 NOTE — Unmapped (Signed)
Thank you for seeing [place] today in clinic!    -Start low-dose naltrexone 4.5 mg oral capsule nightly   -Continue tizanidine 2 mg oral tablet daily as needed  -Continue tylenol 650 mg oral tablet every 6 hours as needed  -Discontinue oxycodone 5 mg oral tablet as needed (taking this with low-dose naltrexone can make you feel sick)    -We referred you to Dr. Abbott Pao (peripheral neurosurgeon) to evaluate you for possible brachial plexopathy and interventions that would help  -We referred you to pain psychology  -Please let us know if you would like a referral to physical therapy (gently) to maintain your motor function     -We prescribed you a TENS unit for your muscle tightness     -As we discussed, we are going to start low dose naltrexone, which has been shown to be beneficial in fibromyalgia pain (and others).  You take one capsule at night, typically 1 hour before bed. It is taken the same everyday.  It usually is very well tolerated, most common side effects are vivid dreams (not bad dreams), and headache.  If you have insomnia, you can take it in the morning.  If you do have side effects, we can prescribe a lower dose.  It typically takes around 4 weeks to really start working, so keep taking it if you are tolerating it.  It is made by a special pharmacy.  I am going to send it to [facility].  Cost varies, and depends on insurance coverage. The pharmacy can talk to you about specific cost.  If you are going to have major surgery, we likely need to stop this 1 week before.    Call [facility] at [phone] in the next few days to discuss the prescription so they can send that to you.    Naltrexone 4.5mg  capsules.  Take 1 capsule by mouth 1 hour before bed daily.    -I have prescribed a TENS unit. They will contact you if it is approved with insurance and then likely ship it to you.  They can give you instructions in how to use it, but typically you can use it once or twice a day for around 30 minutes.  At your next visit we will talk about if it is helpful for your pain.  If so, hopefully you will be able to keep it.  If it is not helpful, then you can send it back.  You can also look online for where to place the pads for the type of pain that you have.

## 2022-10-19 NOTE — Unmapped (Signed)
Faxed notes, demographics and IFC/Tens rx to [facility]

## 2022-10-29 NOTE — Unmapped (Unsigned)
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 Rooth is a 52 y.o. female with a history of asthma, FSGS, GERD, gout, HLD, HTN and ESRD on dialysis. She underwent left brachio-cephalic vein AVF (01/29/20) which thrombosed prior to maturation and underwent left brachiobasilic AVG on 06/08/20 with Dr. Norma Fredrickson which also failed and was never used.  Since the brachiobasilic AVG was created in August, she experienced left arm pain, numbness in the hand, and some weakness of the left upper extremity which has been recurrent. On 10/12 she underwent surgical exposure of left brachial artery and thromboembolectomy of left brachial artery and ulnar artery. On 08/05/20 she underwent left subclavian to brachial artery bypass using reversed GSV with right lower extremity GSV harvest. She developed leukocytosis with CT imaging demonstrating a 3 cm collection with air in the upper arm near the vein graft, and on 08/14/20 she underwent LUE wound exploration and washout, excisional debridement of subcutaneous tissue and wound vac placement. On 02/09/21 she underwent LUE angiogram with PTA of the distal anastomosis of the left subclavian to brachial bypass and PTA of the left brachial artery. She was last seen in clinic on 09/06/21, since which she has presented to the ED several times for left arm pain with diminished strength, chest pain, and most recently on 09/15/22 for weakness and fever, dishcarged on 09/19/22. She returns today for follow-up.    Today, Ms. Laino presents to clinic ***    She denies fever, chills, fatigue, poor appetite, chest pain/pressure, shortness of breath, coughing, wheezing, abdominal pain, back pain, nausea, vomiting, diarrhea, constipation, rectal bleeding, difficulty with urination, leg pain, BLE edema.        RX/ ALLERGIES/ MED HX/SURG HX/ SOC HX/FAM HX: Reviewed & updated in Epic    ROS: The balance of 10 systems reviewed is negative except as noted in the HPI       PE:  There were no vitals filed for this visit.    General: WD, WN female in NAD.    HEENT: Normocephalic, atraumatic, anicteric sclera.  Cardiovascular: Regular rate.  Pulmonary: Normal work of breathing on room air.  Abd: Soft, non-tender, non-distended.  Musculoskeletal: Extremities warm and well perfused. No cyanosis, clubbing, or edema.  Skin: Normal skin turgor. No rashes.  Neurologic: Alert and oriented x 3. No obvious focal deficits.  Psych: Appropriate affect.      Imaging: These images were personally reviewed by Dr. Pattricia Boss:      Assessment/Plan:    Ms. Favret is a 52 y.o. 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  She is s/p LUE angiogram with PTA of the distal anastomosis of the left subclavian to brachial bypass and PTA of the left brachial artery. Imaging was reviewed by Dr. Pattricia Boss demonstrating ***.           We will follow up with Ms. Andre Lefort in *** with repeat {  MF imaging:[id number]}      Hypertension: Ms. Ottaviani was encouraged to monitor and keep a log of their BP measurements with a goal under 160/80, better is 140/70, ideal is 120/60, and to work with their PCP to achieve those goals.

## 2022-10-31 ENCOUNTER — Ambulatory Visit
Admit: 2022-10-31 | Discharge: 2022-11-01 | Payer: MEDICARE | Attending: Student in an Organized Health Care Education/Training Program | Primary: Student in an Organized Health Care Education/Training Program

## 2022-10-31 DIAGNOSIS — Z7289 Other problems related to lifestyle: Principal | ICD-10-CM

## 2022-10-31 DIAGNOSIS — Z1159 Encounter for screening for other viral diseases: Principal | ICD-10-CM

## 2022-10-31 DIAGNOSIS — L409 Psoriasis, unspecified: Principal | ICD-10-CM

## 2022-10-31 DIAGNOSIS — Z79899 Other long term (current) drug therapy: Principal | ICD-10-CM

## 2022-10-31 MED ORDER — ADALIMUMAB PEN CITRATE FREE 40 MG/0.4 ML
SUBCUTANEOUS | 0 refills | 84.00000 days | Status: CN
Start: 2022-10-31 — End: 2023-01-29

## 2022-10-31 MED ORDER — COSENTYX PEN 300 MG/2 PENS (150 MG/ML) SUBCUTANEOUS
SUBCUTANEOUS | 1 refills | 0.00000 days | Status: CP
Start: 2022-10-31 — End: ?
  Filled 2022-11-14: qty 10, 35d supply, fill #0

## 2022-10-31 MED ORDER — FLUOCINOLONE 0.01 % TOPICAL BODY OIL
Freq: Every day | TOPICAL | 5 refills | 0.00000 days | Status: CN | PRN
Start: 2022-10-31 — End: ?

## 2022-10-31 MED ORDER — TRIAMCINOLONE ACETONIDE 0.1 % TOPICAL OINTMENT
Freq: Two times a day (BID) | TOPICAL | 1 refills | 0.00000 days | Status: CP | PRN
Start: 2022-10-31 — End: 2023-10-31

## 2022-10-31 NOTE — Unmapped (Signed)
Dermatology Note     Assessment and Plan:      Psoriasis  Psoriatic arthritis  - Diagnosis, natural course, and treatment options were discussed with the patient.   - Flare near IV site and blood draw sites could be explained by pathergy.  - Previous treatments: light box, methotrexate (oral and IM), enbrel, humira, topical steroids  - Not controlled on humira. Joint decision made to switch to secukinumab after discussing risks, benefits, and alternatives. No hx of IBD. Patient on dialysis and this is not contraindicated. This medication does not need to be renally dosed.  - Start triamcinolone (KENALOG) 0.1 % ointment; Apply topically two (2) times a day as needed. Use on psoriasis. Patient reports her husband can help her apply this.  - Continue fluocinolone oil on the scalp BID PRN.  - Start loading secukinumab (COSENTYX PEN, 2 PENS,) 150 mg/mL PnIj injection; SubQ: 300 mg once weekly at weeks 0, 1, 2, 3, and 4 followed by 300 mg every 4 weeks. Then Maintenance SubQ: 300 mg every 4 weeks.  - Offered ILK for stubborn area on lower back which patient declined.    High risk medication use  Encounter for screening for other viral diseases to start high risk med  - Quantiferon TB Gold Plus; Future  - Hepatitis B Core Antibody, total; Future  - Hepatitis B Surface Antibody; Future  - Hepatitis B Surface Antigen; Future  - Hepatitis C Antibody; Future    The patient was advised to call for an appointment should any new, changing, or symptomatic lesions develop.     Patient seen by Dr. Odis Luster who is in agreement with my assessment and plan.       RTC: Return in about 3 months (around 01/30/2023) for psoriasis. or sooner as needed   _________________________________________________________________      Chief Complaint     Chief Complaint   Patient presents with   ??? Psoriasis     Psoriasis follow up.   Current flares on back and right arm.  Needs refills       HPI     Megan Robertson is a 52 y.o. female who presents as a returning patient (last seen 03/21/2022) to Dermatology for follow up of psoriasis and PsA.     Patient was due a couple days ago for her humira 40mg  q2w. She is flaring. Her most stubborn area is on the lower back which itches. She had a breakout on the R arm as well near where she had blood draws and IVs during a recent hospitalization. The only topical she uses is Aveeno. She has the scalp oil at home.     Joints feel stiff in the cold weather.    The patient denies any other new or changing lesions or areas of concern.     Pertinent Past Medical History     Psoriasis history:  Long history of psoriasis (first manifested around age 89). ??Has been treated with topical steroids, light, MTX (many years ago; injections, pills d/c because of GI upset) and Enbrel (d/c??2014??because of improving disease and insurance changes?).??  ??  ESRD on peritoneal dialysis  HLD  HTN  Asthma  FSGS    Past Medical History, Family History, Social History, Medication List, Allergies, and Problem List were reviewed in the rooming section of Epic.     ROS: Other than symptoms mentioned in the HPI, no fevers, chills, or other skin complaints    Physical Examination     GENERAL:  Well-appearing female in no acute distress, resting comfortably.  NEURO: Alert and oriented, answers questions appropriately  PSYCH: Normal mood and affect  RESP: No increased work of breathing  SKIN (Waist Up Skin Exam): Per patient request, exam of the scalp, face, eyelids, lips, nose, ears, neck, chest, upper abdomen, back, arms, hands, and fingernails was performed  - Well-demarcated erythematous plaque with silvery scale distributed over the midline spine on the lower back  - R arm with patches of post inflammatory hyperpigmentation      All areas not commented on are within normal limits or unremarkable   Patient declined exam of scalp (has a cap on), but endorses scalp psoriasis      (Approved Template 07/04/2020)

## 2022-11-01 ENCOUNTER — Ambulatory Visit: Admit: 2022-11-01 | Discharge: 2022-11-01 | Payer: MEDICARE

## 2022-11-01 ENCOUNTER — Ambulatory Visit: Admit: 2022-11-01 | Discharge: 2022-11-01 | Payer: MEDICARE | Attending: Vascular Surgery | Primary: Vascular Surgery

## 2022-11-06 DIAGNOSIS — L409 Psoriasis, unspecified: Principal | ICD-10-CM

## 2022-11-06 LAB — TB NIL: TB NIL VALUE: 0.05

## 2022-11-06 LAB — HEPATITIS C ANTIBODY: HEPATITIS C ANTIBODY: NONREACTIVE

## 2022-11-06 LAB — QUANTIFERON TB GOLD PLUS
QUANTIFERON ANTIGEN 1 MINUS NIL: 0.01 [IU]/mL
QUANTIFERON ANTIGEN 2 MINUS NIL: 0.01 [IU]/mL
QUANTIFERON MITOGEN: 9.95 [IU]/mL
QUANTIFERON TB GOLD PLUS: NEGATIVE
QUANTIFERON TB NIL VALUE: 0.05 [IU]/mL

## 2022-11-06 LAB — TB MITOGEN: TB MITOGEN VALUE: 10

## 2022-11-06 LAB — HEPATITIS B SURFACE ANTIBODY
HEPATITIS B SURFACE ANTIBODY QUANT: <8 m[IU]/mL (ref <8.00)
HEPATITIS B SURFACE ANTIBODY: NONREACTIVE

## 2022-11-06 LAB — HEPATITIS B SURFACE ANTIGEN: HEPATITIS B SURFACE ANTIGEN: NONREACTIVE

## 2022-11-06 LAB — TB AG1: TB AG1 VALUE: 0.06

## 2022-11-06 LAB — TB AG2: TB AG2 VALUE: 0.06

## 2022-11-06 LAB — HEPATITIS B CORE ANTIBODY, TOTAL: HEPATITIS B CORE TOTAL ANTIBODY: NONREACTIVE

## 2022-11-07 ENCOUNTER — Ambulatory Visit
Admit: 2022-11-07 | Discharge: 2022-11-08 | Payer: MEDICARE | Attending: Student in an Organized Health Care Education/Training Program | Primary: Student in an Organized Health Care Education/Training Program

## 2022-11-07 ENCOUNTER — Ambulatory Visit: Admit: 2022-11-07 | Discharge: 2022-11-08 | Payer: MEDICARE | Attending: Vascular Surgery | Primary: Vascular Surgery

## 2022-11-07 DIAGNOSIS — M792 Neuralgia and neuritis, unspecified: Principal | ICD-10-CM

## 2022-11-07 NOTE — Unmapped (Signed)
I saw and evaluated the patient, participating in the key portions of the service.  I reviewed the resident’s note.  I agree with the resident’s findings and plan.     Elyn Krogh, MD

## 2022-11-08 NOTE — Unmapped (Signed)
EMG/NCS and ultrasound to be done by Megan Robertson    Return to clinic after the above is complete.

## 2022-11-08 NOTE — Unmapped (Signed)
[facility]      Consulting MD: Karma Greaser, MD       Chief Complaint:: Left arm pain and weakness    HPI   52 y.o. female with a history of FSGS and renal failure undergoing dialysis who has had AV fistula repairs, the last of which was ~1 year ago. After this procedure, she awoke with LUE pain and weakness, which has not significantly improved in the intervening time. The pain is a stabbing, burning pain that gets to an 8/10 in intensity. She has taken medication (narcotics, gabapentin) without significant relief. She presents for consultation for potential surgical treatment options to restore function in the LUE.       Pain Diagram                 Review of Systems: as stated in HPI.           Medical History  Past Medical History:   Diagnosis Date    Abnormal mammogram     Abnormal Pap smear of cervix     Allergic     Anemia     Anxiety     Arthritis     Asthma     Back pain     Cancer (CMS-HCC) Ovarian    Depression     Diabetes mellitus (CMS-HCC) Borderline    Eczema     ESRD (end stage renal disease) on dialysis (CMS-HCC)     FSGS (focal segmental glomerulosclerosis) 1997    renal biopsy at [facility]    GERD (gastroesophageal reflux disease)     Gout     H/O adenoidectomy     had adenoids removed    Hypercholesteremia     Hypertension     Keloid     Ovarian cancer (CMS-HCC)     Pancreatitis     Psoriasis     Seizure (CMS-HCC)     unknown etiology; none for several years    Stroke (CMS-HCC) 2000       Surgical History  Past Surgical History:   Procedure Laterality Date    APPENDECTOMY      BREAST EXCISIONAL BIOPSY Right 11/28/2016    CHEMOTHERAPY      for ovarian CA in 2000    CHG US GUIDE, VASCULAR ACCESS N/A 01/29/2020    Procedure: ULTRASOUND GUIDANCE FOR VASC ACCESS REQUIRING [place] EVAL OF POTENTIAL ACCESS SITES;  Surgeon: Leona Carry, MD;  Location: MAIN OR [facility];  Service: Transplant    CHG US GUIDE, VASCULAR ACCESS N/A 06/08/2020    Procedure: ULTRASOUND GUIDANCE FOR VASC ACCESS REQUIRING [place] EVAL OF POTENTIAL ACCESS SITES;  Surgeon: Leona Carry, MD;  Location: MAIN OR [facility];  Service: Transplant    CHOLECYSTECTOMY      COMBINED HYSTEROSCOPY DIAGNOSTIC / D&C  07/07/2015    Planned for endometrial ablation, but patient had uterine perforation after D&C and unable to perform    HYSTERECTOMY      LEFT OOPHORECTOMY Left 04/12/2000    Removed with L fallopian tube for ectopic pregnancy    OOPHORECTOMY      PR COLONOSCOPY W/BIOPSY SINGLE/MULTIPLE  09/12/2021    Procedure: COLONOSCOPY, FLEXIBLE, PROXIMAL TO SPLENIC FLEXURE; WITH BIOPSY, SINGLE OR MULTIPLE;  Surgeon: Maris Berger, MD;  [facility];  Service: Gastroenterology    PR COLSC FLX W/RMVL OF TUMOR POLYP LESION SNARE TQ N/A 09/12/2021    Procedure: COLONOSCOPY FLEX; W/REMOV TUMOR/LES BY SNARE;  Surgeon: Maris Berger, MD;  [facility];  Service: Gastroenterology    PR CREAT AV FISTULA,AUTOGENOUS GRAFT Left 01/29/2020    Procedure: Create Av Fistula (Separt Proc); Autog Gft;  Surgeon: Leona Carry, MD;  Location: MAIN OR [facility];  Service: Transplant    PR CREAT AV FISTULA,NON-AUTOGENOUS GRAFT Left 06/08/2020    Procedure: CREATE AV FISTULA (SEPARATE PROC); NONAUTOGENOUS GRAFT (EG, BIOLOGICAL COLLAGEN, THERMOPLASTIC GRAFT);  Surgeon: Leona Carry, MD;  Location: MAIN OR [facility];  Service: Transplant    PR EXPLORATION N/FLWD SURG UPPER EXTREMITY ARTERY Left 08/02/2020    Procedure: Exploration Not Followed By Surgical Repair, Artery; Upper Extremity (Eg, Axillary, Brachial, Radial, Ulnar);  Surgeon: Earney Mallet, MD;  Location: MAIN OR [facility];  Service: Vascular    PR INCIS/DRAIN ARM,DEEP ABSC/HEMATOMA Left 08/14/2020    Procedure: INCISION AND DRAINAGE, UPPER ARM OR ELBOW AREA; DEEP ABSCESS OR HEMATOMA;  Surgeon: Earney Mallet, MD;  Location: MAIN OR [place];  Service: Vascular    PR INSERTION TUNNEL INTRAPERITONEAL CATH DIAL OPEN Midline 02/01/2021    Procedure: INSERTION OF INTRAPERITONEAL CANNULA OR CATHETER FOR DRAINAGE OR DIALYSIS; PERMANENT;  Surgeon: Loney Hering, MD;  Location: MAIN OR [place];  Service: Transplant    PR REMV ART CLOT AXILL-BRACH,ARM INCIS Left 08/02/2020    Procedure: Embolect/Thrombec; Axilry/Brachial Art-Arm Incs;  Surgeon: Earney Mallet, MD;  Location: MAIN OR [facility];  Service: Vascular    PR UPPER GI ENDOSCOPY,BIOPSY N/A 08/29/2017    Procedure: UGI ENDOSCOPY; WITH BIOPSY, SINGLE OR MULTIPLE;  Surgeon: Neysa Hotter, MD;  [facility];  Service: Gastroenterology    PR VEIN BYPASS GRAFT,SUBCL-BRACHIAL Left 08/05/2020    Procedure: Bypass Graft, With Vein; Subclavian-Brachial;  Surgeon: Earney Mallet, MD;  Location: MAIN OR [facility];  Service: Vascular    SALPINGECTOMY Left 04/12/2000    Ectopic pregnancy    SALPINGECTOMY Right 11/22/2015    at time of total vaginal hysterectomy    TONSILECTOMY, ADENOIDECTOMY, BILATERAL MYRINGOTOMY AND TUBES      TOTAL VAGINAL HYSTERECTOMY  11/22/2015    With R salpingectomy       Medications  Reviewed and updated.     Social Note    Physical Examination:  General  Well nourished, appearing stated age   Not in acute distress   Alert and oriented x3  Appropriate affect and mood  Appropriate to conversation   No increased work of breathing on room air    Motor  Upper extremity      MMT Right Left    (/5) (/5)     Shoulder/Trunk       Trapezius (C3,4) 5/5 5/5   Rhomboids (C4,5) 5/5 5/5   Serratus Anterior (C5, 6,7) 5/5 5/5   Pectoralis Major Clavicular Head (C5,6) 5/5 5/5   Pectoralis Major Sternocostal Head (C6,7,8) 5/5 5/5   Supraspinatus (C5,6) 5/5 5/5   Latissimus Dorsi (C6,7,8) 5/5 5/5     Arm       Deltoid (C5,6) 5/5 5/5   Biceps (C5,6) 5/5 5/5   Triceps (C6,7,8) 5/5 5/5   ECRL (C5,6) 5/5 5/5   Brachioradialis (C6) 5/5 5/5   Supinator (C6,7) 5/5 4/5     Hand       Extensor Digitorum (C7,8) 5/5 4/5 ECU (C7,8) 5/5 4-/5   Abductor Pollicis Longus (PIN C7,8) 5/5 4/5   Extensor Pollicis Longus (PIN C7,8) 5/5 4/5   Extensor Pollicis Brevis (PIN C7,8) 5/5 5/5   Flexor [employer]  Radialis (C6,7) 5/5 5/5   Abductor Pollicis Brevis (C8, T1) 5/5 5/5   Flexor Digitorum Profundus (C7,8) 5/5 5/5   Flexor Pollicis Longus (C7,8) 5/5 4/5   Pronator Teres (C6,7) 5/5 4/5   Flexor Digitorum Superficialis (C7,8,T1) 5/5 4/5   Opponens (C8,T1) 5/5 4/5   1st Lumbrical Interosseous (C8, T1) 5/5 4/5   Flexor Digitorum Profundus IV and V (C7,8) 5/5 4/5   1st Dorsal Interosseous (C8,T1) 5/5 4/5   Flexor Carpi Ulnaris (C7,8,T1) 5/5 4/5   Abductor Digiti Minimi (C8,T1) 5/5 4/5   Flexor Digiti Minimi (C8,T1) 5/5 4/5   Abductor Pollicis (C8,T1) 4/5 4/5      Right Left   Thenar wasting   No Yes   Hypothenar wasting   No Yes       Lower extremity      MMT Right Left    (/5) (/5)     Upper Leg       Iliopsoas (L1-3) 5/5 5/5   Quadriceps (L2-4) 5/5 5/5   Hip Adductors (L2-4) 5/5 5/5   Gluteus Maximus (L5,S1,S2) 5/5 5/5   Hamstring muscles (L5,S1,S2) 5/5 5/5   Gluteus Medius and Minimus (L4,L5,S1) 5/5 5/5     Lower Leg/Foot       Gastrocnemius (S1-2) 5/5 5/5   Soleus (S1-2) 5/5 5/5   Tibialis Posterior (L4,5) 5/5 5/5   Flexor Digitorum Longus (L5,S1,S2) 5/5 5/5   Tibialis Anterior (L4,5) 5/5 5/5   Extensor Hallucis Longus (L5,S1) 5/5 5/5   Peroneus Longus and Brevis 5/5 5/5   Extensor Digitorum Longus (L5,S1) 5/5 5/5   Extensor Digitorum Brevis 5/5 5/5       Sensation Right Left   Axillary   normal normal   Median   normal numbness   Radial normal normal   Ulnar normal numbness   Femoral normal normal   Peroneal normal normal       Tinels over previous surgical scar in L Axilla    Electrodiagnostics:  None       Imaging:   None    Assessment/Plan:  52 y.o. female w/p AVF repair 1 year ago with subsequent neuropathic pain and LUE weakness without significant improvement. Given the clinical picture, recommended obtaining an EMG/NCS to as well as am ultrasound of the upper extremity to localize neural compression, continuity and electrical assessment of LUE nerves. Recommend the patient return to clinic after the above is complete to further treatment plan.     Imaging was shown to the patient, all of the patient's questions were answered to their satisfaction, and the patient agreed to the plan of care.  I spent 60 minutes of combined time in pre chart review, imaging review, placing orders, and with the patient.     Tresa Garter, MD, MS, MBE, MPH  Director of Peripheral Nerve Surgery  Minimally Invasive and Complex Spine Surgery  Clinical Assistant Professor of Neurosurgery  [facility]

## 2022-11-12 DIAGNOSIS — E785 Hyperlipidemia, unspecified: Principal | ICD-10-CM

## 2022-11-12 NOTE — Unmapped (Signed)
[facility] Specialty Medication Onboarding    Specialty Medication: COSENTYX PEN (2 PENS) 150 mg/mL Pnij injection (secukinumab)  Prior Authorization: Approved   Financial Assistance: No - copay  <$25  Final Copay/Day Supply: $0 / 28 days LD          $0 / 28 days MD  Insurance Restrictions: Yes - max 1 month supply     Notes to Pharmacist:     The triage team has completed the benefits investigation and has determined that the patient is able to fill this medication at [facility]. Please contact the patient to complete the onboarding or follow up with the prescribing physician as needed.

## 2022-11-13 NOTE — Unmapped (Signed)
[facility]   Patient Onboarding/Medication Counseling    Megan Robertson is a 52 y.o. female with psoriasis who I am counseling today on initiation of therapy.  I am speaking to the patient.    Was a Nurse, learning disability used for this call? No    Verified patient's date of birth / HIPAA.    Specialty medication(s) to be sent: Inflammatory Disorders: Cosentyx      Non-specialty medications/supplies to be sent: Repatha, sharps kit      Medications not needed at this time: na         Cosentyx (secukinumab)    Medication & Administration     Dosage: Plaque psoriasis: Inject 300mg  under the skin at weeks 0, 1, 2, 3, and 4 followed by 300mg  every 4 weeks      Lab tests required prior to treatment initiation:  Tuberculosis: Tuberculosis screening resulted in a non-reactive Quantiferon TB Gold assay.      Administration:     Prefilled Sensoready?? auto-injector pen  Gather all supplies needed for injection on a clean, flat working surface: medication pen removed from packaging, alcohol swab, sharps container, etc.  Look at the medication label - look for correct medication, correct dose, and check the expiration date  Look at the medication - the liquid visible in the window on the side of the pen device should appear clear and colorless to slightly yellow  Lay the auto-injector pen on a flat surface and allow it to warm up to room temperature for at least 15-30 minutes  Select injection site - you can use the front of your thigh or your belly (but not the area 2 inches around your belly button); if someone else is giving you the injection you can also use your upper arm in the skin covering your triceps muscle  Prepare injection site - wash your hands and clean the skin at the injection site with an alcohol swab and let it air dry, do not touch the injection site again before the injection  Twist off the purple safety cap in the direction of the arrow, do not remove until immediately prior to injection and do not touch the yellow needle cover  Put the white needle cover against your skin at the injection site at a 90 degree angle, hold the pen such that you can see the clear medication window  Press down and hold the pen firmly against your skin, there will be a click when the injection starts  Continue to hold the pen firmly against your skin for about 10-15 seconds - the window will start to turn solid green  There will be a second click sound when the injection is almost complete, verify the window is solid green to indicate the injection is complete and then pull the pen away from your skin  Dispose of the used auto-injector pen immediately in your sharps disposal container the needle will be covered automatically  If you see any blood at the injection site, press a cotton ball or gauze on the site and maintain pressure until the bleeding stops, do not rub the injection site      Adherence/Missed dose instructions:  If your injection is given more than 4 days after your scheduled injection date - consult your pharmacist for additional instructions on how to adjust your dosing schedule.        Goals of Therapy       Plaque Psoriasis  Minimize areas of skin involvement (% BSA)  Avoidance of long term glucocorticoid use  Maintenance of effective psychosocial functioning        Side Effects & Monitoring Parameters     Injection site reaction (redness, irritation, inflammation localized to the site of administration)  Signs of a common cold - minor sore throat, runny or stuffy nose, etc.  Diarrhea    The following side effects should be reported to the provider:  Signs of a hypersensitivity reaction - rash; hives; itching; red, swollen, blistered, or peeling skin; wheezing; tightness in the chest or throat; difficulty breathing, swallowing, or talking; swelling of the mouth, face, lips, tongue, or throat; etc.  Reduced immune function - report signs of infection such as fever; chills; body aches; very bad sore throat; ear or sinus pain; cough; more sputum or change in color of sputum; pain with passing urine; wound that will not heal, etc.  Also at a slightly higher risk of some malignancies (mainly skin and blood cancers) due to this reduced immune function.  In the case of signs of infection - the patient should hold the next dose of Cosentyx?? and call your primary care provider to ensure adequate medical care.  Treatment may be resumed when infection is treated and patient is asymptomatic.  Muscle pain or weakness  Shortness of breath      Warnings, Precautions, & Contraindications     Have your bloodwork checked as you have been told by your prescriber  Talk with your doctor if you are pregnant, planning to become pregnant, or breastfeeding  Discuss the possible need for holding your dose(s) of Cosentyx?? when a planned procedure is scheduled with the prescriber as it may delay healing/recovery timeline       Drug/Food Interactions     Medication list reviewed in Epic. The patient was instructed to inform the care team before taking any new medications or supplements. No drug interactions identified.   If you have a latex allergy use caution when handling, the needle cap of the Cosentyx?? prefilled syringe and the safety cap for the Cosentyx Sensoready?? pen contains a derivative of natural rubber latex. Unoready?? pen does NOT contain latex.   Talk with you prescriber or pharmacist before receiving any live vaccinations while taking this medication and after you stop taking it      Storage, Handling Precautions, & Disposal     Store this medication in the refrigerator.  Do not freeze  May store intact Sensoready pens and 150 mg/mL prefilled syringes at ?30??C (?86??F) for up to 4 days; may return to the refrigerator if unused  Store in original packaging, protected from light  Do not shake  Dispose of used syringes/pens in a sharps disposal container           Current Medications (including OTC/herbals), Comorbidities and Allergies     Current Outpatient Medications   Medication Sig Dispense Refill    adalimumab 80 mg/0.8 mL-40 mg/0.4 mL PnKt Inject the contents of 1 pen (80mg ) under the skin for one dose, THEN 1 pen (40mg ) every 14 days thereafter. (Patient not taking: Reported on 11/07/2022) 3 each 0    ADALIMUMAB PEN CITRATE FREE 40 MG/0.4 ML Inject the contents of 1 pen (40 mg total) under the skin every fourteen (14) days. 6 each 0    allopurinoL (ZYLOPRIM) 100 MG tablet Take 1 tablet (100 mg total) by mouth Every Tuesday, Thursday and Saturday. 36 tablet 3    amLODIPine (NORVASC) 10 MG tablet Take 1 tablet (10 mg total) by  mouth daily. 90 tablet 0    apixaban (ELIQUIS) 5 mg Tab Take 1 tablet (5 mg total) by mouth in the morning. 30 tablet 0    aspirin 81 MG chewable tablet Chew 1 tablet (81 mg total) daily. 30 tablet 1    atorvastatin (LIPITOR) 80 MG tablet Take 1 tablet (80 mg total) by mouth daily. 30 tablet 0    beclomethasone dipropionate (QVAR REDIHALER) 80 mcg/actuation inhaler 1 puff every morning, 2 puffs every evening (Patient taking differently: continuous as needed. 1 puff every morning, 2 puffs every evening as needed) 10.6 g 0    calcitriol (ROCALTROL) 0.5 MCG capsule Take 2 capsules (1 mcg total) by mouth daily. 180 capsule 3    cinacalcet (SENSIPAR) 60 MG tablet Take 1 tablet (60 mg total) by mouth.      colchicine (MITIGARE) 0.6 mg cap capsule Take by mouth.      evolocumab (REPATHA SURECLICK) 140 mg/mL PnIj Inject the contents of 1 pen (140 mg) under the skin every fourteen (14) days. 6 mL 3    fluocinolone 0.01 % external oil Apply topically daily as needed. Use for psoriasis and scalp scaling. 120 mL 5    furosemide (LASIX) 40 MG tablet Take 1 tablet (40 mg total) by mouth 3 (three) times a week. To be taken once a day if you develop leg swelling. 36 tablet 3    isosorbide mononitrate (IMDUR) 120 MG 24 hr tablet Take 1 tablet (120 mg total) by mouth daily. 30 tablet 0    naltrexone 4.5 mg capsule Take 1 capsule by mouth nightly. 30 capsule 2    ondansetron (ZOFRAN) 4 MG tablet Take 1 tablet (4 mg total) by mouth every six (6) hours for 3 days. 12 tablet 0    pantoprazole (PROTONIX) 40 MG tablet Take 1 tablet (40 mg total) by mouth two (2) times a day. 120 tablet 0    secukinumab (COSENTYX PEN, 2 PENS,) 150 mg/mL PnIj injection Inject the contents of 2 pens (300 mg total) under the skin once weekly at weeks 0, 1, 2, 3, and 4. Loading dose. 12 mL 1    secukinumab (COSENTYX PEN, 2 PENS,) 150 mg/mL PnIj injection Inject the contents of 2 pens (300 mg total) under the skin every twenty-eight (28) days. Maintenance dose. 6 mL 1    sevelamer (RENVELA) 800 mg tablet Take 1 tablet (800 mg total) by mouth.      tizanidine (ZANAFLEX) 2 MG tablet Take 1 tablet (2 mg total) by mouth once as needed. 30 tablet 0    triamcinolone (KENALOG) 0.1 % ointment Apply topically two (2) times a day as needed. Use on psoriasis. 80 g 1     No current facility-administered medications for this visit.       Allergies   Allergen Reactions    Cephalexin Itching     unknown; tolerates cefepime    Mango Itching    Peach (Prunus Persica) Itching    Wellbutrin [Bupropion Hcl] Other (See Comments)     Seizure    Egg Itching    Fish Containing Products Itching    Lactase-Rennet Nausea And Vomiting    Lexiscan [Regadenoson] Nausea And Vomiting     Patient had nausea after the Lexi then vomitted [id number]    Mushroom Itching    Tomato (Solanum Lycopersicum) Itching       Patient Active Problem List   Diagnosis    Stage 4 chronic kidney disease (CMS-HCC)    Gout  GERD (gastroesophageal reflux disease)    Chronic diarrhea    Hypertension    Ductal hyperplasia of breast    Affective disorder, psychotic (CMS-HCC)    Bereavement    PTSD (post-traumatic stress disorder)    Nipple discharge    Prediabetes    History of head injury    Mild persistent asthma without complication    Other chest pain    Dyspnea on exertion    Dyslipidemia    Stable angina OSA (obstructive sleep apnea)    Lichen sclerosus    Vasomotor symptoms due to menopause    Dyspareunia in female    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)    Leukocytosis    Anemia    Thrombocytopenia (CMS-HCC)    Hypokalemia    Tachycardia    Anemia in chronic kidney disease    Anaphylactic shock, unspecified, initial encounter    Coagulation defect, unspecified (CMS-HCC)    Colitis    ESRD (end stage renal disease) on dialysis (CMS-HCC)    Metabolic acidosis, NAG, bicarbonate losses    Obesity    S/P vaginal hysterectomy    Secondary hyperparathyroidism of renal origin (CMS-HCC)    Stenosis of left brachial artery (CMS-HCC)    Need for shingles vaccine    High serum lactate    Melena    Coffee ground emesis    Nausea    Headache    Blurry vision    Sepsis (CMS-HCC)       Reviewed and up to date in Epic.    Appropriateness of Therapy     Acute infections noted within Epic:  No active infections  Patient reported infection: None    Is medication and dose appropriate based on diagnosis and infection status? Yes    Prescription has been clinically reviewed: Yes      Baseline Quality of Life Assessment      How many days over the past month did your psoriasis  keep you from your normal activities? For example, brushing your teeth or getting up in the morning. Patient declined to answer    Financial Information     Medication Assistance provided: Prior Authorization    Anticipated copay of $0 reviewed with patient. Verified delivery address.    Delivery Information     Scheduled delivery date: 1/24    Expected start date: 1/24    Medication will be delivered via Same Day Courier to the prescription address in [facility]  This shipment will not require a signature.      Explained the services we provide at [facility] and that each month we would call to set up refills.  Stressed importance of returning phone calls so that we could ensure they receive their medications in time each month.  Informed patient that we should be setting up refills 7-10 days prior to when they will run out of medication.  A pharmacist will reach out to perform a clinical assessment periodically.  Informed patient that a welcome packet, containing information about our pharmacy and other support services, a Notice of Privacy Practices, and a drug information handout will be sent.      The patient or caregiver noted above participated in the development of this care plan and knows that they can request review of or adjustments to the care plan at any time.      Patient or caregiver verbalized understanding of the above information as well as how to contact the pharmacy at [phone] option  4 with any questions/concerns.  The pharmacy is open Monday through Friday 8:30am-4:30pm.  A pharmacist is available 24/7 via pager to answer any clinical questions they may have.    Patient Specific Needs     Does the patient have any physical, cognitive, or cultural barriers? No    Does the patient have adequate living arrangements? (i.e. the ability to store and take their medication appropriately) Yes    Did you identify any home environmental safety or security hazards? No    Patient prefers to have medications discussed with  Patient     Is the patient or caregiver able to read and understand education materials at a high school level or above? Yes    Patient's primary language is  English     Is the patient high risk? No    SOCIAL DETERMINANTS OF HEALTH     At the [facility], we have learned that life circumstances - like trouble affording food, housing, utilities, or transportation can affect the health of many of our patients.   That is why we wanted to ask: are you currently experiencing any life circumstances that are negatively impacting your health and/or quality of life? Patient declined to answer    Social Determinants of Health     Financial Resource Strain: Low Risk  (09/17/2022)    Overall Financial Resource Strain (CARDIA) Difficulty of Paying Living Expenses: Not very hard   Internet Connectivity: Not on file   Food Insecurity: No Food Insecurity (09/17/2022)    Hunger Vital Sign     Worried About Running Out of Food in the Last Year: Never true     Ran Out of Food in the Last Year: Never true   Tobacco Use: Medium Risk (11/07/2022)    Patient History     Smoking Tobacco Use: Former     Smokeless Tobacco Use: Never     Passive Exposure: Not on file   Housing/Utilities: Low Risk  (09/17/2022)    Housing/Utilities     Within the past 12 months, have you ever stayed: outside, in a car, in a tent, in an overnight shelter, or temporarily in someone else's home (i.e. couch-surfing)?: No     Are you worried about losing your housing?: No     Within the past 12 months, have you been unable to get utilities (heat, electricity) when it was really needed?: No   Alcohol Use: Not on file   Transportation Needs: No Transportation Needs (09/17/2022)    PRAPARE - Transportation     Lack of Transportation (Medical): No     Lack of Transportation (Non-Medical): No   Substance Use: Not on file   Health Literacy: Low Risk  (01/27/2021)    Health Literacy     : Never   Physical Activity: Not on file   Interpersonal Safety: Not on file   Stress: Not on file   Intimate Partner Violence: Not on file   Depression: At risk (12/18/2020)    PHQ-2     PHQ-2 Score: 6   Social Connections: Not on file       Would you be willing to receive help with any of the needs that you have identified today? Not applicable       Megan Robertson [name] Shared [facility] Specialty Pharmacist

## 2022-11-14 MED FILL — REPATHA SURECLICK 140 MG/ML SUBCUTANEOUS PEN INJECTOR: SUBCUTANEOUS | 84 days supply | Qty: 6 | Fill #3

## 2022-11-15 MED ORDER — PANTOPRAZOLE 40 MG TABLET,DELAYED RELEASE
ORAL_TABLET | ORAL | 0 refills | 0 days
Start: 2022-11-15 — End: ?

## 2022-11-21 ENCOUNTER — Ambulatory Visit: Admit: 2022-11-21 | Discharge: 2022-11-22 | Payer: MEDICARE | Attending: Vascular Surgery | Primary: Vascular Surgery

## 2022-11-21 DIAGNOSIS — I998 Other disorder of circulatory system: Principal | ICD-10-CM

## 2022-11-21 NOTE — Unmapped (Unsigned)
Visit date:  11/21/22     Reason for visit: LUE ischemia    Assessment/Plan:    [name] is a 52 y.o. 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  LUE arterial duplex demonstrates patent bypass and native brachial artery stenosis with no worsening of symptoms.  No intervention indicated at this time.  We will follow-up with Ms. Balbo in 1 year with LUE arterial duplex.  She should continue eliquis.       HPI: Megan Robertson is a 52 y.o. female with a history of asthma, FSGS, GERD, gout, HLD, HTN and ESRD on dialysis.  She underwent left brachio-cephalic vein AVF (01/29/20) which thrombosed prior to maturation and underwent left brachiobasilic AVG on 06/08/20 with Dr. Norma Fredrickson which also failed and was never used.  Since the brachiobasilic AVG was created in August, she experienced left arm pain, numbness in the hand, and some weakness of the left upper extremity. These symptoms initially had some improvement with grip strengh exercises.  She presented to the hospital on 08/02/20 after having severe pain, cold fingertips, paresthesias and decreased LUE pulses with arterial duplex demonstrating occlusion of mid/distal brachial artery and no flow in distal radial artery or ulnar artery.  On 08/02/20 she underwent surgical exposure of left brachial artery and thromboembolectomy of left brachial artery and ulnar artery.  On 08/05/20 she underwent left subclavian to brachial artery bypass using reversed GSV with right lower extremity GSV harvest.  She developed leukocytosis with CT imaging demonstrating a 3 cm collection with air in the upper arm near the vein graft, and on 08/14/20 she underwent LUE wound exploration and washout, excisional debridement of subcutaneous tissue and wound vac placement.  On 01/25/21 duplex demonstrated patent bypass with possible obstruction distal to the bypass graft with dampened flow in the radial and ulnar arteries and on 02/09/21 she underwent LUE angiogram with PTA of the distal anastomosis of the left subclavian to brachial bypass and PTA of the left brachial artery.  She was last seen in clinic on 09/06/21 and presents today for follow-up.     Megan Robertson presents to the clinic today with family.  She states her hand symptoms are the same and have not worsened.  Her pain has improved.  She can slightly squeeze her left hand but cannot grip and drops objects. She is taking eliquis.     Allergies   Allergen Reactions    Cephalexin Itching     unknown; tolerates cefepime    Mango Itching    Peach (Prunus Persica) Itching    Wellbutrin [Bupropion Hcl] Other (See Comments)     Seizure    Egg Itching    Fish Containing Products Itching    Lactase-Rennet Nausea And Vomiting    Lexiscan [Regadenoson] Nausea And Vomiting     Patient had nausea after the Lexi then vomitted [id number]    Mushroom Itching    Tomato (Solanum Lycopersicum) Itching       Current Outpatient Medications   Medication Sig Dispense Refill    adalimumab 80 mg/0.8 mL-40 mg/0.4 mL  PnKt Inject the contents of 1 pen (80mg ) under the skin for one dose, THEN 1 pen (40mg ) every 14 days thereafter. 3 each 0    ADALIMUMAB PEN CITRATE FREE 40 MG/0.4 ML Inject the contents of 1 pen (40 mg total) under the skin every fourteen (14) days. 6 each 0    allopurinoL (ZYLOPRIM) 100 MG tablet Take 1 tablet (100 mg total) by mouth Every Tuesday, Thursday and Saturday. 36 tablet 3    amLODIPine (NORVASC) 10 MG tablet Take 1 tablet (10 mg total) by mouth daily. 90 tablet 0    apixaban (ELIQUIS) 5 mg Tab Take 1 tablet (5 mg total) by mouth in the morning. 30 tablet 0    aspirin 81 MG chewable tablet Chew 1 tablet (81 mg total) daily. 30 tablet 1    atorvastatin (LIPITOR) 80 MG tablet Take 1 tablet (80 mg total) by mouth daily. 30 tablet 0    beclomethasone dipropionate (QVAR REDIHALER) 80 mcg/actuation inhaler 1 puff every morning, 2 puffs every evening (Patient taking differently: continuous as needed. 1 puff every morning, 2 puffs every evening as needed) 10.6 g 0    calcitriol (ROCALTROL) 0.5 MCG capsule Take 2 capsules (1 mcg total) by mouth daily. 180 capsule 3    cinacalcet (SENSIPAR) 60 MG tablet Take 1 tablet (60 mg total) by mouth.      colchicine (MITIGARE) 0.6 mg cap capsule Take by mouth.      evolocumab (REPATHA SURECLICK) 140 mg/mL PnIj Inject the contents of 1 pen (140 mg) under the skin every fourteen (14) days. 6 mL 3    fluocinolone 0.01 % external oil Apply topically daily as needed. Use for psoriasis and scalp scaling. 120 mL 5    furosemide (LASIX) 40 MG tablet Take 1 tablet (40 mg total) by mouth 3 (three) times a week. To be taken once a day if you develop leg swelling. 36 tablet 3    isosorbide mononitrate (IMDUR) 120 MG 24 hr tablet Take 1 tablet (120 mg total) by mouth daily. 30 tablet 0    naltrexone 4.5 mg capsule Take 1 capsule by mouth nightly. 30 capsule 2    ondansetron (ZOFRAN) 4 MG tablet Take 1 tablet (4 mg total) by mouth every six (6) hours for 3 days. 12 tablet 0    pantoprazole (PROTONIX) 40 MG tablet Take 1 tablet (40 mg total) by mouth two (2) times a day. 120 tablet 0    secukinumab (COSENTYX PEN, 2 PENS,) 150 mg/mL PnIj injection Inject the contents of 2 pens (300 mg total) under the skin once weekly at weeks 0, 1, 2, 3, and 4. Loading dose. 12 mL 1    secukinumab (COSENTYX PEN, 2 PENS,) 150 mg/mL PnIj injection Inject the contents of 2 pens (300 mg total) under the skin every twenty-eight (28) days. Maintenance dose. 6 mL 1    sevelamer (RENVELA) 800 mg tablet Take 1 tablet (800 mg total) by mouth.      tizanidine (ZANAFLEX) 2 MG tablet Take 1 tablet (2 mg total) by mouth once as needed. 30 tablet 0    triamcinolone (KENALOG) 0.1 % ointment Apply topically two (2) times a day as needed. Use on psoriasis. 80 g 1     No current facility-administered medications for this visit.       Past Medical History:   Diagnosis Date    Abnormal mammogram     Abnormal Pap smear of cervix     Allergic  Anemia     Anxiety     Arthritis     Asthma     Back pain     Cancer (CMS-HCC) Ovarian    Depression     Diabetes mellitus (CMS-HCC) Borderline    Eczema     ESRD (end stage renal disease) on dialysis (CMS-HCC)     FSGS (focal segmental glomerulosclerosis) 1997    renal biopsy at [facility]    GERD (gastroesophageal reflux disease)     Gout     H/O adenoidectomy     had adenoids removed    Hypercholesteremia     Hypertension     Keloid     Ovarian cancer (CMS-HCC)     Pancreatitis     Psoriasis     Seizure (CMS-HCC)     unknown etiology; none for several years    Stroke (CMS-HCC) 2000       Past Surgical History:   Procedure Laterality Date    APPENDECTOMY      BREAST EXCISIONAL BIOPSY Right 11/28/2016    CHEMOTHERAPY      for ovarian CA in 2000    CHG US GUIDE, VASCULAR ACCESS N/A 01/29/2020    Procedure: ULTRASOUND GUIDANCE FOR VASC ACCESS REQUIRING [place] EVAL OF POTENTIAL ACCESS SITES;  Surgeon: Leona Carry, MD;  Location: MAIN OR [facility];  Service: Transplant    CHG US GUIDE, VASCULAR ACCESS N/A 06/08/2020    Procedure: ULTRASOUND GUIDANCE FOR VASC ACCESS REQUIRING [place] EVAL OF POTENTIAL ACCESS SITES;  Surgeon: Leona Carry, MD;  Location: MAIN OR [facility];  Service: Transplant    CHOLECYSTECTOMY      COMBINED HYSTEROSCOPY DIAGNOSTIC / D&C  07/07/2015    Planned for endometrial ablation, but patient had uterine perforation after D&C and unable to perform    HYSTERECTOMY      LEFT OOPHORECTOMY Left 04/12/2000    Removed with L fallopian tube for ectopic pregnancy OOPHORECTOMY      PR COLONOSCOPY W/BIOPSY SINGLE/MULTIPLE  09/12/2021    Procedure: COLONOSCOPY, FLEXIBLE, PROXIMAL TO SPLENIC FLEXURE; WITH BIOPSY, SINGLE OR MULTIPLE;  Surgeon: Maris Berger, MD;  Location: [facility]URES [facility];  Service: Gastroenterology    PR COLSC FLX W/RMVL OF TUMOR POLYP LESION SNARE TQ N/A 09/12/2021    Procedure: COLONOSCOPY FLEX; W/REMOV TUMOR/LES BY SNARE;  Surgeon: Maris Berger, MD;  [facility]: [facility];  Service: Gastroenterology    PR CREAT AV FISTULA,AUTOGENOUS GRAFT Left 01/29/2020    Procedure: Create Av Fistula (Separt Proc); Autog Gft;  Surgeon: Leona Carry, MD;  Location: MAIN OR [facility];  Service: Transplant    PR CREAT AV FISTULA,NON-AUTOGENOUS GRAFT Left 06/08/2020    Procedure: CREATE AV FISTULA (SEPARATE PROC); NONAUTOGENOUS GRAFT (EG, BIOLOGICAL COLLAGEN, THERMOPLASTIC GRAFT);  Surgeon: Leona Carry, MD;  Location: MAIN OR [facility];  Service: Transplant    PR EXPLORATION N/FLWD SURG UPPER EXTREMITY ARTERY Left 08/02/2020    Procedure: Exploration Not Followed By Surgical Repair, Artery; Upper Extremity (Eg, Axillary, Brachial, Radial, Ulnar);  Surgeon: Earney Mallet, MD;  Location: MAIN OR [facility];  Service: Vascular    PR INCIS/DRAIN ARM,DEEP ABSC/HEMATOMA Left 08/14/2020    Procedure: INCISION AND DRAINAGE, UPPER ARM OR ELBOW AREA; DEEP ABSCESS OR HEMATOMA;  Surgeon: Earney Mallet, MD;  Location: MAIN OR [place];  Service: Vascular    PR INSERTION TUNNEL INTRAPERITONEAL CATH DIAL OPEN Midline 02/01/2021    Procedure: INSERTION OF INTRAPERITONEAL CANNULA OR CATHETER FOR DRAINAGE OR DIALYSIS; PERMANENT;  Surgeon:  Loney Hering, MD;  Location: MA[facility] OR [facility];  Service: Transplant    PR REMV ART CLOT AXILL-BRACH,ARM INCIS Left 08/02/2020    Procedure: Embolect/Thrombec; Axilry/Brachial Art-Arm Incs;  Surgeon: Earney Mallet, MD;  Location: MAIN OR [facility];  Service: Vascular    PR UPPER GI ENDOSCOPY,BIOPSY N/A 08/29/2017    Procedure: UGI ENDOSCOPY; WITH BIOPSY, SINGLE OR MULTIPLE;  Surgeon: Neysa Hotter, MD;  Location: GI PROCEDURES [facility];  Service: Gastroenterology    PR VEIN BYPASS GRAFT,SUBCL-BRACHIAL Left 08/05/2020    Procedure: Bypass Graft, With Vein; Subclavian-Brachial;  Surgeon: Earney Mallet, MD;  Location: MAIN OR [facility];  Service: Vascular    SALPINGECTOMY Left 04/12/2000    Ectopic pregnancy    SALPINGECTOMY Right 11/22/2015    at time of total vaginal hysterectomy    TONSILECTOMY, ADENOIDECTOMY, BILATERAL MYRINGOTOMY AND TUBES      TOTAL VAGINAL HYSTERECTOMY  11/22/2015    With R salpingectomy        Social History     Socioeconomic History    Marital status: Married   Tobacco Use    Smoking status: Former     Current packs/day: 0.00     Average packs/day: 2.0 packs/day for 1 year (2.0 ttl pk-yrs)     Types: Cigarettes     Start date: 06/30/2009     Quit date: 06/30/2010     Years since quitting: 12.4    Smokeless tobacco: Never   Vaping Use    Vaping Use: Never used   Substance and Sexual Activity    Alcohol use: Not Currently    Drug use: Not Currently    Sexual activity: Yes     Partners: Male     Birth control/protection: Bilateral Tubal Ligation   Other Topics Concern    Do you use sunscreen? No    Tanning bed use? No    Are you easily burned? No    Excessive sun exposure? No    Blistering sunburns? No   Social History Narrative    on disability since 1994, but does not qualify anymore since she got married and her husband has income.     Lives in [place] with husband, Lisbeth Ply and 2 children     Social Determinants of Health     Financial Resource Strain: Low Risk  (11/17/2022)    Overall Financial Resource Strain (CARDIA)     Difficulty of Paying Living Expenses: Not hard at all   Food Insecurity: No Food Insecurity (11/17/2022)    Hunger Vital Sign     Worried About Running Out of Food in the Last Year: Never true     Ran Out of Food in the Last Year: Never true Transportation Needs: No Transportation Needs (11/17/2022)    PRAPARE - Therapist, art (Medical): No     Lack of Transportation (Non-Medical): No       Family History   Adopted: Yes   Problem Relation Age of Onset    Heart attack Mother 14    Alcohol abuse Mother     Mental illness Mother     No Known Problems Father     No Known Problems Sister     No Known Problems Daughter     No Known Problems Maternal Grandmother     No Known Problems Maternal Grandfather     No Known Problems Paternal Grandmother     No Known Problems Paternal Grandfather     No Known Problems  Brother     No Known Problems Son     No Known Problems Maternal Aunt     No Known Problems Maternal Uncle     No Known Problems Paternal Aunt     No Known Problems Paternal Uncle     No Known Problems Other     Anesthesia problems Neg Hx     Broken bones Neg Hx     Cancer Neg Hx     Clotting disorder Neg Hx     Collagen disease Neg Hx     Diabetes Neg Hx     Dislocations Neg Hx     Fibromyalgia Neg Hx     Gout Neg Hx     Hemophilia Neg Hx     Osteoporosis Neg Hx     Rheumatologic disease Neg Hx     Scoliosis Neg Hx     Severe sprains Neg Hx     Sickle cell anemia Neg Hx     Spinal Compression Fracture Neg Hx     Melanoma Neg Hx     Basal cell carcinoma Neg Hx     Squamous cell carcinoma Neg Hx     Breast cancer Neg Hx         PE:    Vitals:    11/21/22 0949   BP: 129/84   Pulse: 105   Temp: 36.2 ??C (97.1 ??F)       General: WD, WN well-appearing female in NAD.    Cardiovascular:  HDS.    Lungs:  Respirations even, nonlabored.      Musculoskeletal:  LUE: patient able to move extremity and digits however grip is decreased.     Neurological: Alert and oriented x 4.         Imaging:  11/01/22 LUE arterial duplex: these images/results were reviewed by Dr. Coralee Rud  Final Interpretation     Left: Patent bypass with decreased velocities from prior exam. Native BA stenosis present.

## 2022-11-22 ENCOUNTER — Ambulatory Visit: Admit: 2022-11-22 | Discharge: 2022-11-23

## 2022-11-22 MED ORDER — AMLODIPINE 10 MG TABLET
ORAL_TABLET | Freq: Every day | ORAL | 3 refills | 90 days | Status: CP
Start: 2022-11-22 — End: 2023-11-17

## 2022-11-22 MED ORDER — APIXABAN 5 MG TABLET
ORAL_TABLET | Freq: Every day | ORAL | 3 refills | 90 days | Status: CP
Start: 2022-11-22 — End: 2023-11-17

## 2022-11-23 NOTE — Unmapped (Unsigned)
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   - eliquis 5mg  daily    ESRD - ESRD on peritoneal dialysis since 2021 due to FSGS    Follow up 3 months    Future Appointments   Date Time Provider Department Center   12/04/2022  1:00 PM Inocencio Homes, MD [facility]   12/17/2022  1:00 PM Roe Coombs, PhD [facility]   12/19/2022  2:00 PM Ermalinda Barrios, MD [facility]   01/15/2023  2:00 PM Galen Daft, FNP [facility]   01/30/2023  2:00 PM Sula Soda, MD [facility]   04/16/2023  4:30 PM Nilsa Nutting, MD [facility]   11/20/2023 11:00 AM [facility] OUT[facility] 1 [facility] [place]   11/20/2023 12:15 PM Earney Mallet, MD [facility]       SUBJECTIVE     Chief Complaint   Patient presents with    Follow-up         Megan Robertson is a 52 y.o. female who presents to clinic today for the following issues:    Likes going to church, working with husband.   Husband cooks, less enjoyable to eat since having COVID.  Feels overall tired  Peritoneal dialysis going well, does nightly.    OBJECTIVE   BP 122/89 (BP Site: R Arm, BP Position: Sitting, BP Cuff Size: Large)  - Pulse 90  - Temp 36.4 ??C (97.5 ??F) (Temporal)  - Ht 157.5 cm (5' 2)  - Wt 83.3 kg (183 lb 9.6 oz)  - LMP  (LMP Unknown)  - BMI 33.58 kg/m??     General Apperance: no acute distress  Respiratory: normal respiratory effort  Neurologic: normal gait, normal speech  Psychiatric: appropriate affect   Skin: normal color    Tollie Eth, MD   [facility], PGY3    Attending: Karl Ito       [facility] [address]   Telephone [phone]  Fax [phone]  [email or web address]

## 2022-11-26 NOTE — Unmapped (Signed)
Immediately after or during the visit, I reviewed with the resident the medical history and the resident???s findings on physical examination.  I discussed with the resident the patient???s diagnosis and concur with the treatment plan as documented in the resident note. Duanne Moron, MD

## 2022-11-28 NOTE — Unmapped (Signed)
Patient only has Medicare Part A as of 11/22/2022. No additional transplant evaluation appointments should be scheduled until insurance is reviewed.

## 2022-12-04 ENCOUNTER — Ambulatory Visit
Admit: 2022-12-04 | Discharge: 2022-12-05 | Payer: MEDICARE | Attending: Physical Medicine & Rehabilitation | Primary: Physical Medicine & Rehabilitation

## 2022-12-05 DIAGNOSIS — E213 Hyperparathyroidism, unspecified: Principal | ICD-10-CM

## 2022-12-05 DIAGNOSIS — N250 Renal osteodystrophy: Principal | ICD-10-CM

## 2022-12-05 MED ORDER — CINACALCET 60 MG TABLET
ORAL_TABLET | Freq: Every day | ORAL | 2 refills | 30 days | Status: CP
Start: 2022-12-05 — End: ?

## 2022-12-07 NOTE — Unmapped (Signed)
[facility]  Clinical Neurophysiology Laboratory  [place], [place]         Patient: Megan Robertson Date of Birth: December 19, 1970  [facility] #: [id number] Handedness: Right  Sex: Female      Visit Date: 12/04/2022 1:25 PM  Age: 52 Years  Attending: Valeda Malm, MD   Resident/Fellow: Sueanne Margarita, DO   Req Provider: Ermalinda Barrios, MD  Current Height: 5 feet 2 inch  Chief complaint: left upper extremity numbness/tingling    History/Exam: This is a 52 year old woman here for evaluation of LUE numbness/tingling after 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  Since the brachiobasilic AVG was created in 2021, she has been experiencing left arm pain, numbness/tingling, and weakness. Physical examination reveals weakness in left hand grip and IO, decreased sensation to light touch in the lateral forearm and digits 1-3, 1+ bicep reflex on the left (2+ on the right), and 1+ tricep reflex bilaterally. She has scarring over her left medial arm and wrist. She is referred for evaluation of upper extremity mononeuropathy vs brachial plexopathy.         Motor NCS      Nerve / Sites Muscle Latency Ref. Amplitude Ref. Dur. Distance Lat Diff Velocity Ref.     ms ms mV mV ms cm ms m/s m/s   L Median - APB      Wrist APB NR ?4.40 NR ?4.2 NR 8         Elbow APB NR  NR  NR  NR  ?49.0   L Ulnar - ADM      Wrist ADM 3.00 ?3.50 16.5 ?5.6 6.08 8         B.Elbow ADM 6.08  16.0  6.40 17 3.08 55.1 ?49.0      A.Elbow ADM 8.25  14.2  6.48 13 2.17 60.0 ?49.0       F  Wave      Nerve F min Ref.    ms ms   L Ulnar - ADM 27.5 ?31.0       Sensory NCS      Nerve / Sites Rec. Site Peak Lat Ref. PP Amp Ref. Distance Vel.     ms ms ??V ??V cm m/s   L Median - Dig II (Antidromic)      Wrist Index 4.75 ?3.80 12.5 ?10.0 14 37   L Ulnar - Dig V (Antidromic)      Wrist Dig V 3.60 ?3.80 14.0 ?10.0 14 53   L Radial - Superficial (Antidromic)      Forearm Wrist 2.31 ?2.80 40.1  10 59 L Medial antebrachial cutaneous - (Antidromic)      Elbow Forearm NR  NR  12 NR   R Medial antebrachial cutaneous - (Antidromic)      Elbow Forearm NR  NR  12 NR   L Lateral antebrachial cutaneous - (Antidromic)      Elbow Forearm 2.85  7.5  12 53   R Lateral antebrachial cutaneous - (Antidromic)      Elbow Forearm 2.17  11.9  12 75       EMG Summary Table     Spontaneous MUP Recruitment Comment   Muscle Nerve Roots Fib PSW Fasc Other Amp Dur. PPP Pattern Other   L. Abductor pollicis brevis Median C8-T1 None None None None 1+ 1+ 1+ Reduced None   L. First dorsal interosseous Ulnar C8-T1 None None None None N  N N Normal None   L. Flexor carpi radialis Median C6-C7 None None None None 1+ 1+ 1+ Reduced None   L. Pronator teres Median C6-C7 None None None None 1+ 1+ 1+ Reduced None   L. Extensor digitorum communis Radial C7-C8 None None None None N N N Normal None       Summary:  After identifying the patient in the waiting room and reviewing all appropriately available medical records, the patient was taken back to the examination room where the procedure was explained, the sites of examination were noted, the patient's questions were answered, and the patient's verbal consent for the procedure was obtained. Studies were performed at [facility] using a radiant warmer and [employer] Synergy EMG system.    Motor nerve conduction studies of the left ulnar nerve demonstrates normal distal motor latency, normal CMAP amplitude, and normal conduction velocity. There is an absent motor evoked response in the left median nerve. Minimal F-wave latency is normal in the left ulnar nerve.     Sensory nerve conduction studies of the right median nerve demonstrate prolonged peak latency and normal SNAP amplitude. The left ulnar, left radial, left lateral antebrachial cutaneous, and right lateral antebrachial cutaneous nerves demonstrate normal peak latencies and normal SNAP amplitudes. There are absent sensory evoked responses of the left and right median antebrachial cutaneous nerves that is likely technical.     EMG evaluation was performed left upper extremity demonstrates chronic reinnervation changes in the left APB, left FCR, and left PT. All other muscles tested are normal as noted in the table above.    Sonographic evaluation was performed on the left median nerve. The median nerve measures 48mm2 in the forearm with normal morphology as it traverses distally. The nerve measures 56mm2 at the wrist and palm, which is borderline abnormal. The wrist to forearm ratio is greater than 2:1, which is abnormal.  The nerve was traced through the entire upper limb to the axilla. It is normal through the elbow and upper arm. A video was saved of this area. The nerve measures 41mm2 in the axilla, which is a normal value.   [place] images were stored in a permanent location.    Conclusions:  This is an abnormal, but technically challenging study.     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 This is localized to a site proximal to the innervation of the pronator teres, though [place] does not identify an location of focal compression or enlargement.    The absent medial antebrachial cutaneous nerve responses bilaterally are likely technical due to patient's body habitus.     There is NO electrophysiologic evidence of a cervical radiculopathy or brachial plexopathy.         - - - - - - - - - - - - -   Neuromuscular  Fellow    Sueanne Margarita, DO PGY-V    As the attending physician, my signature affirms that I personally participated in the clinical assessment of this patient, performed or reviewed all electrodiagnostic procedures, and prepared or reviewed the conclusions of this report.    - - - - - - - - - - - - Attending Electromyographer  Valeda Malm, MD

## 2022-12-10 NOTE — Unmapped (Signed)
[facility] Refill Coordination Note    Specialty Medication(s) to be Shipped:   Inflammatory Disorders: Cosentyx    Other medication(s) to be shipped: No additional medications requested for fill at this time     Megan Robertson, DOB: 05-18-1971  Phone: [phone] (home)       All above HIPAA information was verified with patient.     Was a Nurse, learning disability used for this call? No    Completed refill call assessment today to schedule patient's medication shipment from the [facility] [phone]).  All relevant notes have been reviewed.     Specialty medication(s) and dose(s) confirmed: Regimen is correct and unchanged.   Changes to medications: Megan Robertson reports no changes at this time.  Changes to insurance: No  New side effects reported not previously addressed with a pharmacist or physician: None reported  Questions for the pharmacist: No    Confirmed patient received a Conservation officer, historic buildings and a Surveyor, mining with first shipment. The patient will receive a drug information handout for each medication shipped and additional FDA Medication Guides as required.       DISEASE/MEDICATION-SPECIFIC INFORMATION        For patients on injectable medications: Patient currently has 1 doses left.  Next injection is scheduled for 12/17/22.    SPECIALTY MEDICATION ADHERENCE     Medication Adherence    Patient reported X missed doses in the last month: 0  Specialty Medication: COSENTYX PEN (2 PENS) 150 mg/mL Pnij injection (secukinumab)  Patient is on additional specialty medications: No  Patient is on more than two specialty medications: No              Were doses missed due to medication being on hold? No    COSENTYX PEN (2 PENS) 150  mg/ml: 6 days of medicine on hand       REFERRAL TO PHARMACIST     Referral to the pharmacist: Not needed      [facility]     Shipping address confirmed in Epic.     Patient was notified of new phone menu : No    Delivery Scheduled: Yes, Expected medication delivery date: 12/13/22.     Medication will be delivered via Same Day Courier to the prescription address in Epic WAM.    Megan Robertson   [facility] Specialty Technician

## 2022-12-13 NOTE — Unmapped (Signed)
Megan Robertson -[place] 's COSENTYX PEN (2 PENS) 150 mg/mL Pnij injection (secukinumab) shipment will be delayed as a result of   insurance processor being down.    I have reached out to the patient  at [phone] and left a voicemail message.  We will wait for a call back from the patient to reschedule the delivery.  We have not confirmed the new delivery date.

## 2022-12-17 ENCOUNTER — Telehealth: Admit: 2022-12-17 | Discharge: 2022-12-18 | Payer: MEDICARE | Attending: Clinical | Primary: Clinical

## 2022-12-17 DIAGNOSIS — M792 Neuralgia and neuritis, unspecified: Principal | ICD-10-CM
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
## 2022-12-19 ENCOUNTER — Ambulatory Visit
Admit: 2022-12-19 | Discharge: 2022-12-20 | Payer: MEDICARE | Attending: Student in an Organized Health Care Education/Training Program | Primary: Student in an Organized Health Care Education/Training Program

## 2022-12-19 DIAGNOSIS — M792 Neuralgia and neuritis, unspecified: Principal | ICD-10-CM

## 2022-12-19 MED ORDER — PREGABALIN 50 MG CAPSULE
ORAL_CAPSULE | Freq: Three times a day (TID) | ORAL | 11 refills | 30 days | Status: CP
Start: 2022-12-19 — End: 2023-12-19

## 2022-12-19 NOTE — Unmapped (Signed)
Megan Robertson -[facility] 's COSENTYX PEN (2 PENS) 150 mg/mL Pnij injection (secukinumab) shipment will be canceled  as a result of  insurance processor being down    I have reached out to the patient  at [phone] and communicated the delay. We will not reschedule the medication and have removed this/these medication(s) from the work request.  We have canceled this work request.

## 2022-12-19 NOTE — Unmapped (Signed)
It was a pleasure seeing you today!    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 Andris Baumann Your Care Team:    Spine Surgery Nurse Coordinator Malva Cogan: [phone]     Neurosurgery Executive Assistant, Sam Ray: [phone]    Neurosurgery Clinic Appointments: [phone]     For non-urgent concerns, you can also contact our team with MyChart ([email or web address]

## 2022-12-19 NOTE — Unmapped (Signed)
[facility] called because they cannot have 11 refills of a controlled substance (Lyrica) that was prescribed. They need a new prescription sent with 5 or fewer refills

## 2022-12-19 NOTE — Unmapped (Signed)
Megan Robertson 's Cosentyx shipment will be sent out  as a result of prescription now clarified.       I have reached out to the patient  at [phone] and communicated the delivery change. We will reschedule the medication for the delivery date that the patient agreed upon.  We have confirmed the delivery date as 12/21/22, via same day courier.     [name], BS PharmD - Pharmacist - [facility]   [address], [place], [place] [address]   Phone: [phone] Ext [phone]- Fax. [phone]

## 2022-12-20 NOTE — Unmapped (Signed)
[facility]        Visit Date: 12/19/2022       ID/HPI: 52 y.o. female w/p AVF repair 1 year ago with subsequent neuropathic pain and LUE weakness without significant improvement. Given the clinical picture, recommended obtaining an EMG/NCS to as well as am ultrasound of the upper extremity to localize neural compression, continuity and electrical assessment of LUE nerves. Recommend the patient return to clinic after the above is complete to further treatment plan.      Previous recommendations:    Interval events:      Pain Diagram               Imaging:***    Other Studies             [facility]  Clinical Neurophysiology Laboratory  [place], [place]          Patient:           Megan Robertson   Date of Birth: Mar 07, 1971  [facility] #:            [id number]            Handedness: Right  Sex:     Female      Visit Date:      12/04/2022 1:25 PM  Age:    52 Years  Attending:      Valeda Malm, MD   Resident/Fellow:       Sueanne Margarita, DO   Req Provider:            Ermalinda Barrios, MD  Current Height:         5 feet 2 inch  Chief complaint:        left upper extremity numbness/tingling     History/Exam:            This is a 52 year old woman here for evaluation of LUE numbness/tingling after 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  Since the brachiobasilic AVG was created in 2021, she has been experiencing left arm pain, numbness/tingling, and weakness. Physical examination reveals weakness in left hand grip and IO, decreased sensation to light touch in the lateral forearm and digits 1-3, 1+ bicep reflex on the left (2+ on the right), and 1+ tricep reflex bilaterally. She has scarring over her left medial arm and wrist. She is referred for evaluation of upper extremity mononeuropathy vs brachial plexopathy.          Motor NCS      Nerve / Sites Muscle Latency Ref. Amplitude Ref. Dur. Distance Lat Diff Velocity Ref.       ms ms mV mV ms cm ms m/s m/s   L Median - APB      Wrist APB NR ?4.40 NR ?4.2 NR 8            Elbow APB NR   NR   NR   NR   ?49.0   L Ulnar - ADM      Wrist ADM 3.00 ?3.50 16.5 ?5.6 6.08 8            B.Elbow ADM 6.08   16.0   6.40 17 3.08 55.1 ?49.0      A.Elbow ADM 8.25   14.2   6.48 13 2.17 60.0 ?49.0       F  Wave      Nerve F min Ref.     ms  ms   L Ulnar - ADM 27.5 ?31.0       Sensory NCS      Nerve / Sites Rec. Site Peak Lat Ref. PP Amp Ref. Distance Vel.       ms ms ??V ??V cm m/s   L Median - Dig II (Antidromic)      Wrist Index 4.75 ?3.80 12.5 ?10.0 14 37   L Ulnar - Dig V (Antidromic)      Wrist Dig V 3.60 ?3.80 14.0 ?10.0 14 53   L Radial - Superficial (Antidromic)      Forearm Wrist 2.31 ?2.80 40.1   10 59   L Medial antebrachial cutaneous - (Antidromic)      Elbow Forearm NR   NR   12 NR   R Medial antebrachial cutaneous - (Antidromic)      Elbow Forearm NR   NR   12 NR   L Lateral antebrachial cutaneous - (Antidromic)      Elbow Forearm 2.85   7.5   12 53   R Lateral antebrachial cutaneous - (Antidromic)      Elbow Forearm 2.17   11.9   12 75                     EMG Summary Table       Spontaneous MUP Recruitment Comment   Muscle Nerve Roots Fib PSW Fasc Other Amp Dur. PPP Pattern Other   L. Abductor pollicis brevis Median C8-T1 None None None None 1+ 1+ 1+ Reduced None   L. First dorsal interosseous Ulnar C8-T1 None None None None N N N Normal None   L. Flexor carpi radialis Median C6-C7 None None None None 1+ 1+ 1+ Reduced None   L. Pronator teres Median C6-C7 None None None None 1+ 1+ 1+ Reduced None   L. Extensor digitorum communis Radial C7-C8 None None None None N N N Normal None       Summary:  After identifying the patient in the waiting room and reviewing all appropriately available medical records, the patient was taken back to the examination room where the procedure was explained, the sites of examination were noted, the patient's questions were answered, and the patient's verbal consent for the procedure was obtained. Studies were performed at [facility] using a radiant warmer and [employer] Synergy EMG system.     Motor nerve conduction studies of the left ulnar nerve demonstrates normal distal motor latency, normal CMAP amplitude, and normal conduction velocity. There is an absent motor evoked response in the left median nerve. Minimal F-wave latency is normal in the left ulnar nerve.      Sensory nerve conduction studies of the right median nerve demonstrate prolonged peak latency and normal SNAP amplitude. The left ulnar, left radial, left lateral antebrachial cutaneous, and right lateral antebrachial cutaneous nerves demonstrate normal peak latencies and normal SNAP amplitudes. There are absent sensory evoked responses of the left and right median antebrachial cutaneous nerves that is likely technical.      EMG evaluation was performed left upper extremity demonstrates chronic reinnervation changes in the left APB, left FCR, and left PT. All other muscles tested are normal as noted in the table above.     Sonographic evaluation was performed on the left median nerve. The median nerve measures 21mm2 in the forearm with normal morphology as it traverses distally. The nerve measures 71mm2 at the wrist and palm, which is borderline abnormal. The wrist to forearm ratio  is greater than 2:1, which is abnormal.  The nerve was traced through the entire upper limb to the axilla. It is normal through the elbow and upper arm. A video was saved of this area. The nerve measures 37mm2 in the axilla, which is a normal value.   [place] images were stored in a permanent location.     Conclusions:  This is an abnormal, but technically challenging study.      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 This is localized to a site proximal to the innervation of the pronator teres, though [place] does not identify an location of focal compression or enlargement.     The absent medial antebrachial cutaneous nerve responses bilaterally are likely technical due to patient's body habitus.      There is NO electrophysiologic evidence of a cervical radiculopathy or brachial plexopathy.            - - - - - - - - - - - - -   Neuromuscular  Fellow    Sueanne Margarita, DO PGY-V     As the attending physician, my signature affirms that I personally participated in the clinical assessment of this patient, performed or reviewed all electrodiagnostic procedures, and prepared or reviewed the conclusions of this report.     - - - - - - - - - - - - Attending Electromyographer  Valeda Malm, MD                          Focused Physical Exam:          Assessment/Plan:    This is a 52 y.o. female with ***, presenting with***    Risk Factor Assessment:***    Regarding elective surgery, given *** patient is ***    Medications Ordered: ***    Tests Ordered: ***    The patient was advised to follow up ***    {MDM Documentation Elements (Optional) :29562130}    {Time Attest / VideoTeleDisclaimer / Interpreter (Optional):21094932}    Imaging was shown to the patient, all of the patient's questions were answered to their satisfaction, and the patient agreed to the plan of care.  I spent *** minutes of combined time in pre chart review, imaging review, placing orders, and with the patient.         Tresa Garter, MD, MS, MBE, MPH  Director of Peripheral Nerve Surgery  Minimally Invasive and Complex Spine Surgery  Clinical Assistant Professor of Neurosurgery  [facility] any procedures that may have been performed.    Imaging was shown to the patient, all of the patient's questions were answered to their satisfaction, and the patient agreed to the plan of care.        Tresa Garter, MD, MS, MBE, MPH  Director of Peripheral Nerve Surgery  Minimally Invasive and Complex Spine Surgery  Clinical Assistant Professor of Neurosurgery  [facility]

## 2022-12-21 MED FILL — COSENTYX PEN 300 MG/2 PENS (150 MG/ML) SUBCUTANEOUS: SUBCUTANEOUS | 28 days supply | Qty: 2 | Fill #0

## 2022-12-24 MED ORDER — PREGABALIN 50 MG CAPSULE
ORAL_CAPSULE | Freq: Three times a day (TID) | ORAL | 1 refills | 30 days | Status: CP
Start: 2022-12-24 — End: 2023-12-24

## 2022-12-24 NOTE — Unmapped (Signed)
RN attempted to reach patient, left VM letting her know that there was an issue with her initial prescription for Lyrica, but that new order has been sent to [facility]. Please call back if any issues.

## 2022-12-26 NOTE — Unmapped (Signed)
T[facility] called left patient voicemail to please return call to discuss current insurance coverage. Patient current has Part a only and no additional qualifying secondary found.

## 2023-01-14 MED ORDER — TIZANIDINE 2 MG TABLET
ORAL_TABLET | Freq: Every day | ORAL | 2 refills | 30 days | PRN
Start: 2023-01-14 — End: ?

## 2023-01-15 ENCOUNTER — Ambulatory Visit: Admit: 2023-01-15 | Discharge: 2023-01-16

## 2023-01-15 MED ORDER — NALTREXONE 4.5 MG CAPSULE
ORAL_CAPSULE | Freq: Every evening | ORAL | 2 refills | 0 days | Status: CN
Start: 2023-01-15 — End: ?

## 2023-01-15 MED ORDER — TIZANIDINE 2 MG TABLET
ORAL_TABLET | Freq: Every day | ORAL | 0 refills | 90 days | Status: CP | PRN
Start: 2023-01-15 — End: ?

## 2023-01-15 NOTE — Unmapped (Signed)
Department of Anesthesiology  [facility]  [address], [address]  [place], [place] [address]  [phone]      Chronic Pain Follow Up Note   Assessment and Plan:   Attending: Inas Avena is a 52 y.o. 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 She was seen at our clinic in 2020 for SI joint-related pain, and was re-referred to our clinic by her nephrologist Dr. Stefano Gaul for chronic left upper extremity hand pain refractory to medical management. Started after AVF surgery near axilla, ongoing burning, numb, tingling pain, constant, associated with weakness, tightness, cold sensation. Refractory to gabapentin, duloxetine. Alleviated by tylenol, tizanidine, and oxycodone without adverse effects.     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 Hesitant to start Lyrica per Dr. Abbott Pao after reading that suicidal ideation could be a side effect. LDN caused daytime somnolence at 4.5 mg, though patient may have been taking BID (poor historian). Will trial 1.5 mg nightly; reiterated to use before bed only.  - Continue Tylenol 650 mg oral tablet every 6 hours as needed   - Adjust tizanidine to 1-2 mg oral tablet daily as needed, refilled  - Decrease LDN to 1.5 mg oral capsule nightly     Future Considerations:  - Consider TPIs  - Consider topical ketamine cream   - Revisit Lyrica - discussed risk for SI is low    Return in about 3 months (around 04/17/2023).    Requested Prescriptions     Signed Prescriptions Disp Refills    tizanidine (ZANAFLEX) 2 MG tablet 90 tablet 0     Sig: Take 1 tablet (2 mg total) by mouth daily as needed.    NON FORMULARY 90 each 0     Sig: Low-dose Naltrexone 1.5 mg. Take 1 capsule PO nightly.     Subjective:     HPI:  Megan Robertson is seen in consultation at the request of [name], [name], MD  For evaluation and recommendations regarding Her chronic pain.     At initial visit in December, the patient presented reporting pain in her left arm to the extent of her fingers. The patient described the pain as numbness, throbbing, and stabbing. She stated she had vascular surgery in her left arm and noted she did not have successful surgeries. The patients partner noted she had blood clots in her left arm. She reported difficulty with ROM and noted difficulty with using her fingers. Her partners noted he massaged her left arm for her. She stated that her ROM had increased some, though was not at goal. The patient reported completing PT for her left arm without benefit. She reported having numbness and tingling from her left bicep to her forearm. The patient reported intermittently her fingers would lock up and she would have to massage them before they started working again. The patient reported temperature changes as well as color changes. She reported tight skin and changes in sensation of her left arm. The patient reported dropping items with her left hand due to difficulty gripping. The patient denied having EMG in the past. She confirmed doing dialysis nightly. She reported having a nodule on her thyroid. The patient denied seeing a Psychologist.     Since last visit, the patient has followed with Dermatology,  Neurosurgery, Vascular Surgery, and Pain Psychology.     Today, the patient presents with unchanged pain in her left arm. In terms of medications, the patient consulted with Neurosurgery regarding starting Lyrica, but the patient expresses concern regarding its side effects. The patient has tried naltrexone with moderate benefit but reports increased sedation. She initially reports taking this medication before bed with side effects in the morning, though on discussion her husband mentions her taking it BID, once in the morning and once at night. They express interest in trying LDN or adjusting the dosing schedule. The patient takes Tizanidine 2 mg daily prn, and she reports taking it mainly in the afternoon. She reports moderate benefit in relieving muscle tension in her left arm.     Pain Clinic? No    Current Medications:  Tylenol 1000 mg prn  Oxycodone 5 mg prn  Tizanidine 2 mg prn    The patient states her pain is located in her left arm and right leg and the severity of her pain ranges from 9/10 to 8/10.  Her pain currently is 8/10 and on average is 9/10.  She describes the sensation of her pain as aching, dull, and tingling. Her pain is present all of the time and worst during the day. The patient???s pain impacts enjoyment of life, mood, walking. Her interval history includes None. Her pain has stayed the same, and she does have new pain to discuss today. In regards to medications currently taken for pain management, the patient is tolerating these medications well and complains of associated side effects: None.    Previous interventions include PT    Previous tests include XRAYs  Workmans Compensation is not involved.  This is not involved with a lawsuit or lawyer  The treatment goals include Complete resolution with medications if necessary    Previous Medication Trials: Oxycodone and Tizanidine/Zanaflex and Tylenol     Past Medical History:   Diagnosis Date    Abnormal mammogram     Abnormal Pap smear of cervix     Allergic     Anemia     Anxiety     Arthritis     Asthma     Back pain     Cancer (CMS-HCC) Ovarian    Depression     Diabetes mellitus (CMS-HCC) Borderline    Eczema     ESRD (end stage renal disease) on dialysis (CMS-HCC)     FSGS (focal segmental glomerulosclerosis) 1997    renal biopsy at [facility]    GERD (gastroesophageal reflux disease)     Gout     H/O adenoidectomy     had adenoids removed    Hypercholesteremia     Hypertension     Keloid     Ovarian cancer (CMS-HCC)     Pancreatitis     Psoriasis Seizure (CMS-HCC)     unknown etiology; none for several years    Stroke (CMS-HCC) 2000     Past Surgical History:   Procedure Laterality Date    APPENDECTOMY      BREAST EXCISIONAL BIOPSY Right 11/28/2016    CHEMOTHERAPY      for ovarian CA in 2000    CHG US GUIDE, VASCULAR ACCESS N/A 01/29/2020    Procedure: ULTRASOUND GUIDANCE FOR VASC ACCESS REQUIRING [place] EVAL OF POTENTIAL ACCESS SITES;  Surgeon: Leona Carry, MD;  Location: MAIN OR [facility];  Service: Transplant    CHG US GUIDE, VASCULAR ACCESS N/A 06/08/2020    Procedure: ULTRASOUND GUIDANCE FOR  VASC ACCESS REQUIRING [place] EVAL OF POTENTIAL ACCESS SITES;  Surgeon: Leona Carry, MD;  Location: MAIN OR [facility];  Service: Transplant    CHOLECYSTECTOMY      COMBINED HYSTEROSCOPY DIAGNOSTIC / D&C  07/07/2015    Planned for endometrial ablation, but patient had uterine perforation after D&C and unable to perform    HYSTERECTOMY      LEFT OOPHORECTOMY Left 04/12/2000    Removed with L fallopian tube for ectopic pregnancy    OOPHORECTOMY      PR COLONOSCOPY W/BIOPSY SINGLE/MULTIPLE  09/12/2021    Procedure: COLONOSCOPY, FLEXIBLE, PROXIMAL TO SPLENIC FLEXURE; WITH BIOPSY, SINGLE OR MULTIPLE;  Surgeon: Maris Berger, MD;  Location: [facility];  Service: Gastroenterology    PR COLSC FLX W/RMVL OF TUMOR POLYP LESION SNARE TQ N/A 09/12/2021    Procedure: COLONOSCOPY FLEX; W/REMOV TUMOR/LES BY SNARE;  Surgeon: Maris Berger, MD;  Location: [facility];  Service: Gastroenterology    PR CREAT AV FISTULA,AUTOGENOUS GRAFT Left 01/29/2020    Procedure: Create Av Fistula (Separt Proc); Autog Gft;  Surgeon: Leona Carry, MD;  Location: MAIN OR [facility];  Service: Transplant    PR CREAT AV FISTULA,NON-AUTOGENOUS GRAFT Left 06/08/2020    Procedure: CREATE AV FISTULA (SEPARATE PROC); NONAUTOGENOUS GRAFT (EG, BIOLOGICAL COLLAGEN, THERMOPLASTIC GRAFT);  Surgeon: Leona Carry, MD;  Location: [facility];  Service: Transplant    PR EXPLORATION N/FLWD SURG UPPER EXTREMITY ARTERY Left 08/02/2020    Procedure: Exploration Not Followed By Surgical Repair, Artery; Upper Extremity (Eg, Axillary, Brachial, Radial, Ulnar);  Surgeon: Earney Mallet, MD;  Location: MAIN OR [facility];  Service: Vascular    PR INCIS/DRAIN ARM,DEEP ABSC/HEMATOMA Left 08/14/2020    Procedure: INCISION AND DRAINAGE, UPPER ARM OR ELBOW AREA; DEEP ABSCESS OR HEMATOMA;  Surgeon: Earney Mallet, MD;  Location: MAIN OR [place];  Service: Vascular    PR INSERTION TUNNEL INTRAPERITONEAL CATH DIAL OPEN Midline 02/01/2021    Procedure: INSERTION OF INTRAPERITONEAL CANNULA OR CATHETER FOR DRAINAGE OR DIALYSIS; PERMANENT;  Surgeon: Loney Hering, MD;  Location: MAIN OR [place];  Service: Transplant    PR REMV ART CLOT AXILL-BRACH,ARM INCIS Left 08/02/2020    Procedure: Embolect/Thrombec; Axilry/Brachial Art-Arm Incs;  Surgeon: Earney Mallet, MD;  Location: MAIN OR [facility];  Service: Vascular    PR UPPER GI ENDOSCOPY,BIOPSY N/A 08/29/2017    Procedure: UGI ENDOSCOPY; WITH BIOPSY, SINGLE OR MULTIPLE;  Surgeon: Neysa Hotter, MD;  Location: GI PROCEDURES [facility];  Service: Gastroenterology    PR VEIN BYPASS GRAFT,SUBCL-BRACHIAL Left 08/05/2020    Procedure: Bypass Graft, With Vein; Subclavian-Brachial;  Surgeon: Earney Mallet, MD;  Location: MAIN OR [facility];  Service: Vascular    SALPINGECTOMY Left 04/12/2000    Ectopic pregnancy    SALPINGECTOMY Right 11/22/2015    at time of total vaginal hysterectomy    TONSILECTOMY, ADENOIDECTOMY, BILATERAL MYRINGOTOMY AND TUBES      TOTAL VAGINAL HYSTERECTOMY  11/22/2015    With R salpingectomy     Family History   Adopted: Yes   Problem Relation Age of Onset    Heart attack Mother 73    Alcohol abuse Mother     Mental illness Mother     No Known Problems Father     No Known Problems Sister     No Known Problems Daughter     No Known Problems Maternal Grandmother     No Known Problems Maternal Grandfather     No Known  Problems Paternal Grandmother     No Known Problems Paternal Grandfather     No Known Problems Brother     No Known Problems Son     No Known Problems Maternal Aunt     No Known Problems Maternal Uncle     No Known Problems Paternal Aunt     No Known Problems Paternal Uncle     No Known Problems Other     Anesthesia problems Neg Hx     Broken bones Neg Hx     Cancer Neg Hx     Clotting disorder Neg Hx     Collagen disease Neg Hx     Diabetes Neg Hx     Dislocations Neg Hx     Fibromyalgia Neg Hx     Gout Neg Hx     Hemophilia Neg Hx     Osteoporosis Neg Hx     Rheumatologic disease Neg Hx     Scoliosis Neg Hx     Severe sprains Neg Hx     Sickle cell anemia Neg Hx     Spinal Compression Fracture Neg Hx     Melanoma Neg Hx     Basal cell carcinoma Neg Hx     Squamous cell carcinoma Neg Hx     Breast cancer Neg Hx        Social History:  She reports that she quit smoking about 12 years ago. Her smoking use included cigarettes. She started smoking about 13 years ago. She has a 2 pack-year smoking history. She has never used smokeless tobacco. She reports that she does not currently use alcohol. She reports that she does not currently use drugs.      Allergies as of 01/15/2023 - Reviewed 12/19/2022   Allergen Reaction Noted    Cephalexin Itching 02/22/2016    Mango Itching 01/17/2017    Peach (prunus persica) Itching 10/09/2017    Wellbutrin [bupropion hcl] Other (See Comments) 10/03/2016    Egg Itching 10/03/2016    Fish containing products Itching 10/03/2016    Lactase-rennet Nausea And Vomiting 02/25/2020    Lexiscan [regadenoson] Nausea And Vomiting 12/28/2020    Mushroom Itching 10/03/2016    Tomato (solanum lycopersicum) Itching 10/03/2016      Current Outpatient Medications   Medication Sig Dispense Refill    adalimumab 80 mg/0.8 mL-40 mg/0.4 mL PnKt Inject the contents of 1 pen (80mg ) under the skin for one dose, THEN 1 pen (40mg ) every 14 days thereafter. (Patient not taking: Reported on 11/22/2022) 3 each 0    allopurinoL (ZYLOPRIM) 100 MG tablet Take 1 tablet (100 mg total) by mouth Every Tuesday, Thursday and Saturday. 36 tablet 3    amlodipine (NORVASC) 10 MG tablet Take 1 tablet (10 mg total) by mouth daily. 90 tablet 3    apixaban (ELIQUIS) 5 mg Tab Take 1 tablet (5 mg total) by mouth in the morning. 90 tablet 3    aspirin 81 MG chewable tablet Chew 1 tablet (81 mg total) daily. 30 tablet 1    atorvastatin (LIPITOR) 80 MG tablet Take 1 tablet (80 mg total) by mouth daily. 30 tablet 0    beclomethasone dipropionate (QVAR REDIHALER) 80 mcg/actuation inhaler 1 puff every morning, 2 puffs every evening (Patient taking differently: continuous as needed. 1 puff every morning, 2 puffs every evening as needed) 10.6 g 0    calcitriol (ROCALTROL) 0.5 MCG capsule Take 2 capsules (1 mcg total) by mouth daily. 180 capsule 3  cinacalcet (SENSIPAR) 60 MG tablet Take 1 tablet (60 mg total) by mouth daily. 30 tablet 2    colchicine (MITIGARE) 0.6 mg cap capsule Take by mouth.      evolocumab (REPATHA SURECLICK) 140 mg/mL PnIj Inject the contents of 1 pen (140 mg) under the skin every fourteen (14) days. 6 mL 3    fluocinolone 0.01 % external oil Apply topically daily as needed. Use for psoriasis and scalp scaling. 120 mL 5    furosemide (LASIX) 40 MG tablet Take 1 tablet (40 mg total) by mouth 3 (three) times a week. To be taken once a day if you develop leg swelling. 36 tablet 3    isosorbide mononitrate (IMDUR) 120 MG 24 hr tablet Take 1 tablet (120 mg total) by mouth daily. 30 tablet 0    naltrexone 4.5 mg capsule Take 1 capsule by mouth nightly. 30 capsule 2    ondansetron (ZOFRAN) 4 MG tablet Take 1 tablet (4 mg total) by mouth every six (6) hours for 3 days. 12 tablet 0    pregabalin (LYRICA) 50 MG capsule Take 1 capsule (50 mg total) by mouth Three (3) times a day. 90 capsule 1    secukinumab (COSENTYX PEN, 2 PENS,) 150 mg/mL PnIj injection Inject the contents of 2 pens (300 mg total) under the skin once weekly at weeks 0, 1, 2, 3, and 4. Loading dose. 12 mL 1    secukinumab (COSENTYX PEN, 2 PENS,) 150 mg/mL PnIj injection Inject the contents of 2 pens (300 mg total) under the skin every twenty-eight (28) days. Maintenance dose. 6 mL 1    sevelamer (RENVELA) 800 mg tablet Take 1 tablet (800 mg total) by mouth.      tizanidine (ZANAFLEX) 2 MG tablet Take 1 tablet (2 mg total) by mouth once as needed. 30 tablet 0    triamcinolone (KENALOG) 0.1 % ointment Apply topically two (2) times a day as needed. Use on psoriasis. 80 g 1     No current facility-administered medications for this visit.     Imaging/Tests:     Lab Results   Component Value Date    CREATININE 11.64 (H) 09/19/2022       Lab Results   Component Value Date    ALKPHOS 117 (H) 09/15/2022    BILITOT 0.5 09/15/2022    BILIDIR 0.30 08/11/2020    PROT 7.9 09/15/2022    ALBUMIN 3.8 09/15/2022    ALT 8 (L) 09/15/2022    AST 14 09/15/2022       Lab Results   Component Value Date    PLT 193 09/19/2022       Review of Systems:  General weight loss, weight gain  Cardiovascular difficulty breathing when laying flat  Gastrointestinal diarrhea  Skin dryness  Endocrine excessive thirst, hot flashes, excess urination  Musculoskeletal muscle aches/weakness  Neurologic excessive sleeping, numbness/tingling  Psychiatric depression, anxiety, agitation, anger    She denies any homicidal or suicidal ideation.     Objective:       Physical Exam    VITALS:   Vitals:    01/15/23 1402   BP: 113/76   Pulse: 100   Resp: 16   Temp: 36.1 ??C (96.9 ??F)   SpO2: 97%     Wt Readings from Last 3 Encounters:   01/15/23 83.3 kg (183 lb 9.6 oz)   12/19/22 83.5 kg (184 lb)   11/22/22 83.3 kg (183 lb 9.6 oz)       GENERAL:  The patient is well developed, well-nourished and appears to be in no apparent distress. The patient is pleasant and interactive. Patient is a good historian.  HEENT:    Reveals normocephalic/atraumatic. Clear sclera. Mucous membranes are moist.  RESPIRATORY:   Normal work of breathing.  No supplemental oxygen.  GASTROINTESTINAL:   Soft, non-distended  NEUROLOGIC:    The patient was alert and oriented, speech fluent, normal language.   MUSCULOSKELETAL:    Motor function preserved in upper and lower extremities with normal tone and bulk. Good range of motion of extremities. The patient was able to ambulate without difficulty throughout the clinic today without the assistance of a walking aid.    SKIN:   No obvious rashes, lesions, or erythema.  PSYCHIATRIC:  Appropriate mood and affect. No pressured speech.      Documentation assistance was provided by [employer], on October 23, 2022 at 8:29 AM for Galen Daft, NP.    I personally spent 36 minutes face-to-face and non-face-to-face in the care of this patient, which includes all pre, intra, and post visit time on the date of service.  All documented time was specific to the E/M visit and does not include any procedures that may have been performed.  We are delivering comprehensive, continuous, longitudinal care for this patient with chronic pain.   January 16, 2023 9:09 AM. Documentation assistance provided by the Scribe. I was present during the time the encounter was recorded. The information recorded by the Scribe was done at my direction and has been reviewed and validated by me. Megan Brackett, FNP-C

## 2023-01-15 NOTE — Unmapped (Addendum)
It was good to see you today.    Continue tizanidine; you can try taking 1/2 a tablet twice a day as needed to see if this improves your drowsiness and decreases your muscle spasms.    We will decrease your Naltrexone dose to 1.5 mg. Take 1 capsule every night. If you have side effects, you can stop this medication. Call [facility] regarding delivery: [phone]    We will see you in 3 months, or sooner if needed.

## 2023-01-15 NOTE — Unmapped (Signed)
Megan Robertson reports her psoriasis has cleared up a lot since starting Cosentyx. She has a small amount of residual psoriasis left, but it's clearing up.    She's had no trouble injecting or side effects.     [facility] Clinical Assessment & Refill Coordination Note    Megan Robertson, DOB: 03/13/1971  Phone: [phone] (home)     All above HIPAA information was verified with patient.     Was a Nurse, learning disability used for this call? No    Specialty Medication(s):   Inflammatory Disorders: Cosentyx     Current Outpatient Medications   Medication Sig Dispense Refill    adalimumab 80 mg/0.8 mL-40 mg/0.4 mL PnKt Inject the contents of 1 pen (80mg ) under the skin for one dose, THEN 1 pen (40mg ) every 14 days thereafter. (Patient not taking: Reported on 11/22/2022) 3 each 0    allopurinoL (ZYLOPRIM) 100 MG tablet Take 1 tablet (100 mg total) by mouth Every Tuesday, Thursday and Saturday. 36 tablet 3    amlodipine (NORVASC) 10 MG tablet Take 1 tablet (10 mg total) by mouth daily. 90 tablet 3    apixaban (ELIQUIS) 5 mg Tab Take 1 tablet (5 mg total) by mouth in the morning. 90 tablet 3    aspirin 81 MG chewable tablet Chew 1 tablet (81 mg total) daily. 30 tablet 1    atorvastatin (LIPITOR) 80 MG tablet Take 1 tablet (80 mg total) by mouth daily. 30 tablet 0    beclomethasone dipropionate (QVAR REDIHALER) 80 mcg/actuation inhaler 1 puff every morning, 2 puffs every evening (Patient taking differently: continuous as needed. 1 puff every morning, 2 puffs every evening as needed) 10.6 g 0    calcitriol (ROCALTROL) 0.5 MCG capsule Take 2 capsules (1 mcg total) by mouth daily. 180 capsule 3    cinacalcet (SENSIPAR) 60 MG tablet Take 1 tablet (60 mg total) by mouth daily. 30 tablet 2    colchicine (MITIGARE) 0.6 mg cap capsule Take by mouth.      evolocumab (REPATHA SURECLICK) 140 mg/mL PnIj Inject the contents of 1 pen (140 mg) under the skin every fourteen (14) days. 6 mL 3    fluocinolone 0.01 % external oil Apply topically daily as needed. Use for psoriasis and scalp scaling. 120 mL 5    furosemide (LASIX) 40 MG tablet Take 1 tablet (40 mg total) by mouth 3 (three) times a week. To be taken once a day if you develop leg swelling. 36 tablet 3    isosorbide mononitrate (IMDUR) 120 MG 24 hr tablet Take 1 tablet (120 mg total) by mouth daily. 30 tablet 0    naltrexone 4.5 mg capsule Take 1 capsule by mouth nightly. 30 capsule 2    ondansetron (ZOFRAN) 4 MG tablet Take 1 tablet (4 mg total) by mouth every six (6) hours for 3 days. 12 tablet 0    pregabalin (LYRICA) 50 MG capsule Take 1 capsule (50 mg total) by mouth Three (3) times a day. 90 capsule 1    secukinumab (COSENTYX PEN, 2 PENS,) 150 mg/mL PnIj injection Inject the contents of 2 pens (300 mg total) under the skin once weekly at weeks 0, 1, 2, 3, and 4. Loading dose. 12 mL 1    secukinumab (COSENTYX PEN, 2 PENS,) 150 mg/mL PnIj injection Inject the contents of 2 pens (300 mg total) under the skin every twenty-eight (28) days. Maintenance dose. 6 mL 1    sevelamer (RENVELA) 800 mg tablet Take  1 tablet (800 mg total) by mouth.      tizanidine (ZANAFLEX) 2 MG tablet Take 1 tablet (2 mg total) by mouth once as needed. 30 tablet 0    triamcinolone (KENALOG) 0.1 % ointment Apply topically two (2) times a day as needed. Use on psoriasis. 80 g 1     No current facility-administered medications for this visit.        Changes to medications: Alany reports no changes at this time.    Allergies   Allergen Reactions    Cephalexin Itching     unknown; tolerates cefepime    Mango Itching    Peach (Prunus Persica) Itching    Wellbutrin [Bupropion Hcl] Other (See Comments)     Seizure    Egg Itching    Fish Containing Products Itching    Lactase-Rennet Nausea And Vomiting    Lexiscan [Regadenoson] Nausea And Vomiting     Patient had nausea after the Lexi then vomitted [id number]    Mushroom Itching    Tomato (Solanum Lycopersicum) Itching       Changes to allergies: No    SPECIALTY MEDICATION ADHERENCE     Cosentyx - 0 left  Medication Adherence    Patient reported X missed doses in the last month: 0  Specialty Medication: Cosentyx          Specialty medication(s) dose(s) confirmed: Regimen is correct and unchanged.     Are there any concerns with adherence? No    Adherence counseling provided? Not needed    CLINICAL MANAGEMENT AND INTERVENTION      Clinical Benefit Assessment:    Do you feel the medicine is effective or helping your condition? Yes    Clinical Benefit counseling provided? Not needed    Adverse Effects Assessment:    Are you experiencing any side effects? No    Are you experiencing difficulty administering your medicine? No    Quality of Life Assessment:    Quality of Life    Rheumatology  Oncology  Dermatology  1. What impact has your specialty medication had on the symptoms of your skin condition (i.e. itchiness, soreness, stinging)?: Tremendous  2. What impact has your specialty medication had on your comfort level with your skin?: Tremendous  Cystic Fibrosis          How many days over the past month did your psoriasis  keep you from your normal activities? For example, brushing your teeth or getting up in the morning. 0    Have you discussed this with your provider? Not needed    Acute Infection Status:    Acute infections noted within Epic:  No active infections  Patient reported infection: None    Therapy Appropriateness:    Is therapy appropriate and patient progressing towards therapeutic goals? Yes, therapy is appropriate and should be continued    DISEASE/MEDICATION-SPECIFIC INFORMATION      For patients on injectable medications: Patient currently has 0 doses left.  Next injection is scheduled for 4/7 (has 3 Repatha left - for 3/31, 4/14, 4/28) .    Chronic Inflammatory Diseases: Have you experienced any flares in the last month? No  Has this been reported to your provider? No    PATIENT SPECIFIC NEEDS     Does the patient have any physical, cognitive, or cultural barriers? No    Is the patient high risk? No    Did the patient require a clinical intervention? No    Does the patient require physician intervention  or other additional services (i.e., nutrition, smoking cessation, social work)? No    SOCIAL DETERMINANTS OF HEALTH     At the [facility], we have learned that life circumstances - like trouble affording food, housing, utilities, or transportation can affect the health of many of our patients.   That is why we wanted to ask: are you currently experiencing any life circumstances that are negatively impacting your health and/or quality of life? Patient declined to answer    Social Determinants of Health     Financial Resource Strain: Low Risk  (11/17/2022)    Overall Financial Resource Strain (CARDIA)     Difficulty of Paying Living Expenses: Not hard at all   Internet Connectivity: Not on file   Food Insecurity: No Food Insecurity (11/17/2022)    Hunger Vital Sign     Worried About Running Out of Food in the Last Year: Never true     Ran Out of Food in the Last Year: Never true   Tobacco Use: Medium Risk (11/22/2022)    Patient History     Smoking Tobacco Use: Former     Smokeless Tobacco Use: Never     Passive Exposure: Not on file   Housing/Utilities: Low Risk  (11/17/2022)    Housing/Utilities     Within the past 12 months, have you ever stayed: outside, in a car, in a tent, in an overnight shelter, or temporarily in someone else's home (i.e. couch-surfing)?: No     Are you worried about losing your housing?: No     Within the past 12 months, have you been unable to get utilities (heat, electricity) when it was really needed?: No   Alcohol Use: Not on file   Transportation Needs: No Transportation Needs (11/17/2022)    PRAPARE - Transportation     Lack of Transportation (Medical): No     Lack of Transportation (Non-Medical): No   Substance Use: Not on file   Health Literacy: Low Risk  (01/27/2021)    Health Literacy     : Never   Physical Activity: Not on file   Interpersonal Safety: Not on file   Stress: Not on file   Intimate Partner Violence: Not on file   Depression: Not at risk (11/22/2022)    PHQ-2     PHQ-2 Score: 1   Social Connections: Not on file       Would you be willing to receive help with any of the needs that you have identified today? Not applicable       SHIPPING     Specialty Medication(s) to be Shipped:   Inflammatory Disorders: Cosentyx    Other medication(s) to be shipped: No additional medications requested for fill at this time     Changes to insurance: No    Patient was informed of new phone menu: No    Delivery Scheduled: Yes, Expected medication delivery date: 4/3.     Medication will be delivered via Same Day Courier to the confirmed prescription address in [facility].    The patient will receive a drug information handout for each medication shipped and additional FDA Medication Guides as required.  Verified that patient has previously received a Conservation officer, historic buildings and a Surveyor, mining.    The patient or caregiver noted above participated in the development of this care plan and knows that they can request review of or adjustments to the care plan at any time.      All of the patient's  questions and concerns have been addressed.    Lanney Gins   [facility] Specialty Pharmacist

## 2023-01-15 NOTE — Unmapped (Deleted)
Department of Anesthesiology  [facility]  [address]  [place], [place] [address]  [phone]      Chronic Pain Follow Up Note   Assessment and Plan:   Attending: [name] is a 52 y.o. 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 She was seen at our clinic in 2020 for SI joint-related pain, and was re-referred to our clinic by her nephrologist Dr. Stefano Gaul for chronic left upper extremity hand pain refractory to medical management including gabapentin and duloxetine.     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   >Started after AVF surgery near axilla, ongoing burning, numb, tingling pain, constant, associated with weakness, tightness, cold sensation.   >Refractory to gabapentin, duloxetine;  Alleviated by tylenol, tizanidine, and oxycodone without adverse effects  >Unable to tolerate PT  >Exam of LUE: Sensation light touch and pin prick sensation is impaired from the mid upper arm down 3/5 strength, poor grip strength of the left hand, contracted left hand, tight skin, no palpable thrills in fistula in the lower and middle arm, scar tissue noted on the medial aspect of the left upper arm and neck near the clavicle. Negative Spurling bilaterally.   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 Abbott Pao) to evaluate for possible brachial plexopathy and surgical options to restore function in LUE   - Consider placing referral to PT if patient desires (unable to tolerate sessions previously); encouraged home exercise program and focus on mobility of LUE    Medication and Therapeutic Management: ***  - Continue Tylenol 650 mg oral tablet every 6 hours as needed   - Continue tizanidine 2 mg oral tablet daily as needed, refilled  - Discontinue oxycodone 5 mg oral tablet daily as needed  - Start LDN 4.5 mg oral capsule daily   - Ordered TENS Unit for myofascial pain     Future Considerations:  -Consider TPIs  -Consider topical ketamine cream   -EMG/NCS    -Return in about 3 months (around 01/18/2023).    Orders Placed This Encounter   Procedures    Ambulatory referral to Pain Psychology     Standing Status:   Future     Standing Expiration Date:   10/20/2023     Referral Priority:   Routine     Referral Type:   Generic Referral     Number of Visits Requested:   1    Ambulatory referral to Neurosurgery     Standing Status:   Future     Standing Expiration Date:   10/20/2023     Referral Priority:   Routine     Referral Type:   Generic Referral     Referral Reason:   Specialty Services Required     Referred to Provider:   Ermalinda Barrios, MD     Number of Visits Requested:  1     Requested Prescriptions     Signed Prescriptions Disp Refills    tizanidine (ZANAFLEX) 2 MG tablet 30 tablet 0     Sig: Take 1 tablet (2 mg total) by mouth once as needed.    naltrexone 4.5 mg capsule 30 capsule 2     Sig: Take 1 capsule by mouth nightly.       Patient does  appear to be utilizing pain medications appropriately and does  report that the medications do improve patient's quality of life and functionality level.  At today's visit, the patient reports poor analgesia from their current medication regimen without significant adverse effects.    Current Pain Provider: yes  Urine toxicology: None  Urine toxicology screen N/A appropriate.   Last Opioid Change: N/A  Last EKG: N/A  Previous Compliance Issues: None  Naloxone ordered: N/A  Total morphine equivalents:   Benzodiazepine: No.  Pain Psychology: N/A and Consider referral in the future  [place] DOC: None  [place] database was reviewed 01/15/23.      No diagnosis found.    Subjective:     HPI:  Megan Robertson is seen in consultation at the request of [name], [name], MD  For evaluation and recommendations regarding Her chronic pain.     At initial visit in December, the patient presented reporting pain in her left arm to the extent of her fingers. The patient described the pain as numbness, throbbing, and stabbing. She stated she had vascular surgery in her left arm and noted she did not have successful surgeries. The patients partner noted she had blood clots in her left arm. She reported difficulty with ROM and noted difficulty with using her fingers. Her partners noted he massaged her left arm for her. She stated that her ROM had increased some, though was not at goal. The patient reported completing PT for her left arm without benefit. She reported having numbness and tingling from her left bicep to her forearm. The patient reported intermittently her fingers would lock up and she would have to massage them before they started working again. The patient reported temperature changes as well as color changes. She reported tight skin and changes in sensation of her left arm. The patient reported dropping items with her left hand due to difficulty gripping. The patient denied having EMG in the past. She confirmed doing dialysis nightly. She reported having a nodule on her thyroid. The patient denied seeing a Psychologist.     Since last visit, the patient has followed with Dermatology, Neurosurgery, Vascular Surgery, and Pain Psychology.     In terms of medications, ***    Pain Clinic? No    Current Medications:  Tylenol 1000 mg prn  Oxycodone 5 mg prn  Tizanidine 2 mg prn    The patient states her pain is located *** and the severity of her pain ranges from ***/10 to ***/10.  Her pain currently is ***/10 and on average is ***/10.  She describes the sensation of her pain as {pain described:58928}. Her pain is present {pain frequency:58931} and worst {pain worst:59685}. The patient???s pain impacts {negatively affected:59709}. Her interval history includes {interval history:59710}. Her pain {pain changed:59711}, and she {does does not blank:47747} have new pain to discuss today. She {is/is not:36772} on blood thinners or anti-coagulants. In regards to medications currently taken for pain management, the patient {is:54246::is} tolerating these medications well and complains of associated side effects: {pain med side effects:59712}.    Previous interventions include  PT    Previous tests include XRAYs  Workmans Compensation is not involved.  This is not involved with a lawsuit or lawyer  The treatment goals include Complete resolution with medications if necessary    Previous Medication Trials: Oxycodone and Tizanidine/Zanaflex and Tylenol     Past Medical History:   Diagnosis Date    Abnormal mammogram     Abnormal Pap smear of cervix     Allergic     Anemia     Anxiety     Arthritis     Asthma     Back pain     Cancer (CMS-HCC) Ovarian    Depression     Diabetes mellitus (CMS-HCC) Borderline    Eczema     ESRD (end stage renal disease) on dialysis (CMS-HCC)     FSGS (focal segmental glomerulosclerosis) 1997    renal biopsy at [facility]    GERD (gastroesophageal reflux disease)     Gout     H/O adenoidectomy     had adenoids removed    Hypercholesteremia     Hypertension     Keloid     Ovarian cancer (CMS-HCC)     Pancreatitis     Psoriasis     Seizure (CMS-HCC)     unknown etiology; none for several years    Stroke (CMS-HCC) 2000     Past Surgical History:   Procedure Laterality Date    APPENDECTOMY      BREAST EXCISIONAL BIOPSY Right 11/28/2016    CHEMOTHERAPY      for ovarian CA in 2000 CHG US GUIDE, VASCULAR ACCESS N/A 01/29/2020    Procedure: ULTRASOUND GUIDANCE FOR VASC ACCESS REQUIRING [place] EVAL OF POTENTIAL ACCESS SITES;  Surgeon: Leona Carry, MD;  Location: MAIN OR [facility];  Service: Transplant    CHG US GUIDE, VASCULAR ACCESS N/A 06/08/2020    Procedure: ULTRASOUND GUIDANCE FOR VASC ACCESS REQUIRING [place] EVAL OF POTENTIAL ACCESS SITES;  Surgeon: Leona Carry, MD;  Location: MAIN OR [facility];  Service: Transplant    CHOLECYSTECTOMY      COMBINED HYSTEROSCOPY DIAGNOSTIC / D&C  07/07/2015    Planned for endometrial ablation, but patient had uterine perforation after D&C and unable to perform    HYSTERECTOMY      LEFT OOPHORECTOMY Left 04/12/2000    Removed with L fallopian tube for ectopic pregnancy    OOPHORECTOMY      PR COLONOSCOPY W/BIOPSY SINGLE/MULTIPLE  09/12/2021    Procedure: COLONOSCOPY, FLEXIBLE, PROXIMAL TO SPLENIC FLEXURE; WITH BIOPSY, SINGLE OR MULTIPLE;  Surgeon: Maris Berger, MD;  [facility]: [facility]URES [facility];  Service: Gastroenterology    PR COLSC FLX W/RMVL OF TUMOR POLYP LESION SNARE TQ N/A 09/12/2021    Procedure: COLONOSCOPY FLEX; W/REMOV TUMOR/LES BY SNARE;  Surgeon: Maris Berger, MD;  Location: [facility];  Service: Gastroenterology    PR CREAT AV FISTULA,AUTOGENOUS GRAFT Left 01/29/2020    Procedure: Create Av Fistula (Separt Proc); Autog Gft;  Surgeon: Leona Carry, MD;  Location: MAIN OR [facility];  Service: Transplant    PR CREAT AV FISTULA,NON-AUTOGENOUS GRAFT Left 06/08/2020    Procedure: CREATE AV FISTULA (SEPARATE PROC); NONAUTOGENOUS GRAFT (EG, BIOLOGICAL COLLAGEN, THERMOPLASTIC GRAFT);  Surgeon: Leona Carry, MD;  Location: MAIN OR [facility];  Service: Transplant    PR EXPLORATION N/FLWD SURG UPPER EXTREMITY ARTERY Left 08/02/2020    Procedure: Exploration Not Followed By Surgical Repair, Artery; Upper Extremity (Eg, Axillary, Brachial, Radial, Ulnar);  Surgeon: Gwynneth Albright  Coralee Rud, MD;  Location: MAIN OR [facility];  Service: Vascular    PR INCIS/DRAIN ARM,DEEP ABSC/HEMATOMA Left 08/14/2020    Procedure: INCISION AND DRAINAGE, UPPER ARM OR ELBOW AREA; DEEP ABSCESS OR HEMATOMA;  Surgeon: Earney Mallet, MD;  Location: MAIN OR [place];  Service: Vascular    PR INSERTION TUNNEL INTRAPERITONEAL CATH DIAL OPEN Midline 02/01/2021    Procedure: INSERTION OF INTRAPERITONEAL CANNULA OR CATHETER FOR DRAINAGE OR DIALYSIS; PERMANENT;  Surgeon: Loney Hering, MD;  Location: MAIN OR [place];  Service: Transplant    PR REMV ART CLOT AXILL-BRACH,ARM INCIS Left 08/02/2020    Procedure: Embolect/Thrombec; Axilry/Brachial Art-Arm Incs;  Surgeon: Earney Mallet, MD;  Location: MAIN OR [facility];  Service: Vascular    PR UPPER GI ENDOSCOPY,BIOPSY N/A 08/29/2017    Procedure: UGI ENDOSCOPY; WITH BIOPSY, SINGLE OR MULTIPLE;  Surgeon: Neysa Hotter, MD;  Location: GI PROCEDURES [facility];  Service: Gastroenterology    PR VEIN BYPASS GRAFT,SUBCL-BRACHIAL Left 08/05/2020    Procedure: Bypass Graft, With Vein; Subclavian-Brachial;  Surgeon: Earney Mallet, MD;  Location: MAIN OR [facility];  Service: Vascular    SALPINGECTOMY Left 04/12/2000    Ectopic pregnancy    SALPINGECTOMY Right 11/22/2015    at time of total vaginal hysterectomy    TONSILECTOMY, ADENOIDECTOMY, BILATERAL MYRINGOTOMY AND TUBES      TOTAL VAGINAL HYSTERECTOMY  11/22/2015    With R salpingectomy     Family History   Adopted: Yes   Problem Relation Age of Onset    Heart attack Mother 33    Alcohol abuse Mother     Mental illness Mother     No Known Problems Father     No Known Problems Sister     No Known Problems Daughter     No Known Problems Maternal Grandmother     No Known Problems Maternal Grandfather     No Known Problems Paternal Grandmother     No Known Problems Paternal Grandfather     No Known Problems Brother     No Known Problems Son     No Known Problems Maternal Aunt     No Known Problems Maternal Uncle     No Known Problems Paternal Aunt     No Known Problems Paternal Uncle     No Known Problems Other     Anesthesia problems Neg Hx     Broken bones Neg Hx     Cancer Neg Hx     Clotting disorder Neg Hx     Collagen disease Neg Hx     Diabetes Neg Hx     Dislocations Neg Hx     Fibromyalgia Neg Hx     Gout Neg Hx     Hemophilia Neg Hx     Osteoporosis Neg Hx     Rheumatologic disease Neg Hx     Scoliosis Neg Hx     Severe sprains Neg Hx     Sickle cell anemia Neg Hx     Spinal Compression Fracture Neg Hx     Melanoma Neg Hx     Basal cell carcinoma Neg Hx     Squamous cell carcinoma Neg Hx     Breast cancer Neg Hx        Social History:  She reports that she quit smoking about 12 years ago. Her smoking use included cigarettes. She started smoking about 13 years ago. She has a 2 pack-year smoking history. She has never used smokeless tobacco. She reports that  she does not currently use alcohol. She reports that she does not currently use drugs.      Allergies as of 01/15/2023 - Reviewed 12/19/2022   Allergen Reaction Noted    Cephalexin Itching 02/22/2016    Mango Itching 01/17/2017    Peach (prunus persica) Itching 10/09/2017    Wellbutrin [bupropion hcl] Other (See Comments) 10/03/2016    Egg Itching 10/03/2016    Fish containing products Itching 10/03/2016    Lactase-rennet Nausea And Vomiting 02/25/2020    Lexiscan [regadenoson] Nausea And Vomiting 12/28/2020    Mushroom Itching 10/03/2016    Tomato (solanum lycopersicum) Itching 10/03/2016      Current Outpatient Medications   Medication Sig Dispense Refill    adalimumab 80 mg/0.8 mL-40 mg/0.4 mL PnKt Inject the contents of 1 pen (80mg ) under the skin for one dose, THEN 1 pen (40mg ) every 14 days thereafter. (Patient not taking: Reported on 11/22/2022) 3 each 0    allopurinoL (ZYLOPRIM) 100 MG tablet Take 1 tablet (100 mg total) by mouth Every Tuesday, Thursday and Saturday. 36 tablet 3    amlodipine (NORVASC) 10 MG tablet Take 1 tablet (10 mg total) by mouth daily. 90 tablet 3 apixaban (ELIQUIS) 5 mg Tab Take 1 tablet (5 mg total) by mouth in the morning. 90 tablet 3    aspirin 81 MG chewable tablet Chew 1 tablet (81 mg total) daily. 30 tablet 1    atorvastatin (LIPITOR) 80 MG tablet Take 1 tablet (80 mg total) by mouth daily. 30 tablet 0    beclomethasone dipropionate (QVAR REDIHALER) 80 mcg/actuation inhaler 1 puff every morning, 2 puffs every evening (Patient taking differently: continuous as needed. 1 puff every morning, 2 puffs every evening as needed) 10.6 g 0    calcitriol (ROCALTROL) 0.5 MCG capsule Take 2 capsules (1 mcg total) by mouth daily. 180 capsule 3    cinacalcet (SENSIPAR) 60 MG tablet Take 1 tablet (60 mg total) by mouth daily. 30 tablet 2    colchicine (MITIGARE) 0.6 mg cap capsule Take by mouth.      evolocumab (REPATHA SURECLICK) 140 mg/mL PnIj Inject the contents of 1 pen (140 mg) under the skin every fourteen (14) days. 6 mL 3    fluocinolone 0.01 % external oil Apply topically daily as needed. Use for psoriasis and scalp scaling. 120 mL 5    furosemide (LASIX) 40 MG tablet Take 1 tablet (40 mg total) by mouth 3 (three) times a week. To be taken once a day if you develop leg swelling. 36 tablet 3    isosorbide mononitrate (IMDUR) 120 MG 24 hr tablet Take 1 tablet (120 mg total) by mouth daily. 30 tablet 0    naltrexone 4.5 mg capsule Take 1 capsule by mouth nightly. 30 capsule 2    ondansetron (ZOFRAN) 4 MG tablet Take 1 tablet (4 mg total) by mouth every six (6) hours for 3 days. 12 tablet 0    pregabalin (LYRICA) 50 MG capsule Take 1 capsule (50 mg total) by mouth Three (3) times a day. 90 capsule 1    secukinumab (COSENTYX PEN, 2 PENS,) 150 mg/mL PnIj injection Inject the contents of 2 pens (300 mg total) under the skin once weekly at weeks 0, 1, 2, 3, and 4. Loading dose. 12 mL 1    secukinumab (COSENTYX PEN, 2 PENS,) 150 mg/mL PnIj injection Inject the contents of 2 pens (300 mg total) under the skin every twenty-eight (28) days. Maintenance dose. 6 mL 1 sevelamer (RENVELA)  800 mg tablet Take 1 tablet (800 mg total) by mouth.      tizanidine (ZANAFLEX) 2 MG tablet Take 1 tablet (2 mg total) by mouth once as needed. 30 tablet 0    triamcinolone (KENALOG) 0.1 % ointment Apply topically two (2) times a day as needed. Use on psoriasis. 80 g 1     No current facility-administered medications for this visit.       Imaging/Tests:     Lab Results   Component Value Date    CREATININE 11.64 (H) 09/19/2022       Lab Results   Component Value Date    ALKPHOS 117 (H) 09/15/2022    BILITOT 0.5 09/15/2022    BILIDIR 0.30 08/11/2020    PROT 7.9 09/15/2022    ALBUMIN 3.8 09/15/2022    ALT 8 (L) 09/15/2022    AST 14 09/15/2022       Lab Results   Component Value Date    PLT 193 09/19/2022       Urine toxiciology screen  No results found for: AMPHU, BARBU, BENZU, CANNAU, METHU, OPIAU, COCAU    OPIOID CONFIRMATION:  No results found for: CDIFFTOX, NBUPR, LABCO, HYDROCODONE, HYDROMORPH, MORPHINE, OXYCODONE, OXMU, MAMU, OPIU     BENZODIAZEPINE CONFIRMATION:  No results found for: CDIFFTOX, OHALP, CLONU, HDXYFLRAZUR, 7NHFLU, DESALKYCONF, LORAZURQT, HDXYTRIAZUR, MIDAZURQT, BNZU    Review of Systems:  General {ajlrosgen:46920}  Cardiovascular {aggROSCV:37303}  Gastrointestinal {aggROSGI:37304}  Skin {aggROSskin:37305}  Endocrine {aggROSendo:37306}  Musculoskeletal {aggROSmusk:37307}  Neurologic {aggROSneu:37308}  Psychiatric {aggROSpsych:37309}    She denies any homicidal or suicidal ideation.         Objective:     PHYSICAL EXAM:  LMP  (LMP Unknown)   Wt Readings from Last 3 Encounters:   12/19/22 83.5 kg (184 lb)   11/22/22 83.3 kg (183 lb 9.6 oz)   11/21/22 84 kg (185 lb 1.6 oz)     GENERAL:  The patient is well developed, well-nourished and appears to be in no apparent distress. The patient is pleasant and interactive. Patient is a good historian.  HEENT:    Reveals normocephalic/atraumatic. Clear sclera. Mucous membranes are moist.  RESPIRATORY:   Normal work of breathing.  No supplemental oxygen.  GASTROINTESTINAL:   ***  NEUROLOGIC:    The patient was alert and oriented, speech fluent, normal language.   MUSCULOSKELETAL:    Motor function  {Pain Motor Numbers:7256334870::5/5} in {Desc; upper/lower:12526} extremities with normal tone and bulk. Good range of motion of extremities. The patient was able to ambulate without difficulty throughout the clinic today {With/without:5700} the assistance of a walking aid.    SKIN:   No obvious rashes, lesions, or erythema.  PSYCHIATRIC:  Appropriate mood and affect.  No pressured speech.

## 2023-01-15 NOTE — Unmapped (Unsigned)
Faxed LDN Rx to [facility].  Original and receipt of delivery to be scanned to media tab

## 2023-01-23 DIAGNOSIS — L409 Psoriasis, unspecified: Principal | ICD-10-CM

## 2023-01-23 MED FILL — COSENTYX PEN 300 MG/2 PENS (150 MG/ML) SUBCUTANEOUS: SUBCUTANEOUS | 28 days supply | Qty: 2 | Fill #1

## 2023-01-30 ENCOUNTER — Ambulatory Visit
Admit: 2023-01-30 | Discharge: 2023-01-31 | Payer: MEDICARE | Attending: Student in an Organized Health Care Education/Training Program | Primary: Student in an Organized Health Care Education/Training Program

## 2023-01-30 DIAGNOSIS — N250 Renal osteodystrophy: Principal | ICD-10-CM

## 2023-01-30 DIAGNOSIS — E213 Hyperparathyroidism, unspecified: Principal | ICD-10-CM

## 2023-01-30 DIAGNOSIS — L409 Psoriasis, unspecified: Principal | ICD-10-CM

## 2023-01-30 MED ORDER — SEVELAMER CARBONATE 800 MG TABLET
ORAL_TABLET | Freq: Three times a day (TID) | ORAL | 5 refills | 30 days | Status: CP
Start: 2023-01-30 — End: ?

## 2023-01-30 MED ORDER — COSENTYX PEN 300 MG/2 PENS (150 MG/ML) SUBCUTANEOUS
SUBCUTANEOUS | 3 refills | 84.00000 days | Status: CP
Start: 2023-01-30 — End: ?
  Filled 2023-02-21: qty 4, 56d supply, fill #0

## 2023-01-30 MED ORDER — TRIAMCINOLONE ACETONIDE 0.1 % TOPICAL OINTMENT
Freq: Two times a day (BID) | TOPICAL | 1 refills | 0.00000 days | Status: CP | PRN
Start: 2023-01-30 — End: 2024-01-30

## 2023-01-30 NOTE — Unmapped (Unsigned)
Dermatology Note     Assessment and Plan:      Benign Lesions/ Findings:   {derm skin check benign A&P:75424}  - Reassurance provided regarding the benign appearance of lesions noted on exam today; no treatment is indicated in the absence of symptoms/changes.  - Reinforced importance of photoprotective strategies including liberal and frequent sunscreen use of a broad-spectrum SPF 30 or greater, use of protective clothing, and sun avoidance for prevention of cutaneous malignancy and photoaging.  Counseled patient on the importance of regular self-skin monitoring as well as routine clinical skin examinations as scheduled.     Personal history of {derm skin check cancer list:75440} ***  - No evidence of recurrence at this time.  - Discussed maintaining vigilance and counseled on sun protection as above.    There are no diagnoses linked to this encounter.    - Previous treatments: light box, methotrexate (oral and IM), enbrel, humira, topical steroids     The patient was advised to call for an appointment should any new, changing, or symptomatic lesions develop.     RTC: No follow-ups on file. or sooner as needed   _________________________________________________________________      Chief Complaint     Chief Complaint   Patient presents with    Psoriasis     Better       HPI     Megan Robertson is a 52 y.o. female who presents as a returning patient (last seen 10/31/2022) to Dermatology for follow up of psoriasis w/ PsA. At last visit, started triamcinolone ointment and cosentyx.     The patient denies any other new or changing lesions or areas of concern.     Pertinent Past Medical History     {derm PMH :75456}    Problem List    None    ***    Family History:   {derm skin check melanoma history:75452}    Past Medical History, Family History, Social History, Medication List, Allergies, and Problem List were reviewed in the rooming section of Epic.     ROS: Other than symptoms mentioned in the HPI, {derm skin check ros:75408::no fevers, chills, or other skin complaints}    Physical Examination     GENERAL: Well-appearing female in no acute distress, resting comfortably.  {PE systems:75418::NEURO: Alert and oriented, answers questions appropriately}  {PE extent:75514}  {PE list:75421}  ***    {PE limitations:75419::All areas not commented on are within normal limits or unremarkable}      (Approved Template 07/04/2020)

## 2023-01-30 NOTE — Unmapped (Signed)
PA request for Cosentyx was denied. The requested medication must meet specific criteria. This decision may be appealed. Letter will be scanned into the chart under the Media Tab.

## 2023-01-30 NOTE — Unmapped (Signed)
It was nice to see you today! Your resident physician was Dr. Keagon Glascoe    If any of your medications are too expensive, look for a coupon at GoodRx.com or reference the application on a smartphone  - Enter the medication name, size, and your zip code to find coupons for local pharmacies.   - Print a coupon and bring it to the pharmacy, or pull up the coupon on a smartphone.  - You can also call pharmacies to ask about the cost of your medication before you pick it up.  If you still cannot afford your medication, please let us know.     Please call our clinic at [phone] with any concerns or to schedule a follow up appointment. We look forward to seeing you again!    [facility] releases most results to you as soon as they are available. Therefore, you may see some results before we do. Please give us 2 business days to review the tests and contact you by phone or through MyChart. If you are concerned that some results may be upsetting or confusing, you may wish to wait until we contact you before looking at the report in MyChart. If you have an urgent question, you can send us a message or call our clinic. Otherwise, we prefer that you wait 2 business days for us to contact you.

## 2023-02-04 ENCOUNTER — Telehealth: Admit: 2023-02-04 | Discharge: 2023-02-05 | Payer: MEDICARE | Attending: Clinical | Primary: Clinical

## 2023-02-04 NOTE — Unmapped (Signed)
Confidential Telehealth Psychology Note  [facility]       Patient Name: Megan Robertson [facility]  Medical Record Number: [id number]  Date of Service: February 04, 2023  Attending Psychologist: Colon Branch, PhD   CPT Procedure Code: 53664 for 60 minutes of audio/visual based counseling   Therapy Type: Behavior Modifying/Acceptance and Commitment Therapy (ACT)  Purpose of Treatment: reduce depression symptoms, reduce anxiety symptoms, improve quality of life, improve chronic pain coping    Subjective:   This visit was performed face to face with interactive technology using a HIPPA compliant audio/visual platform. We reviewed confidentiality today. The patient was present in [place], a state in which this provider is licensed and able to provide care (location and contact information confirmed), attended this visit alone, and consented to this virtual pain psychology visit.    Ms.  Newberg is a pleasant 52 y.o.  female with chronic pain and anxiety.  She is being seen via telehealth for this visit. Pt described her mood as doing okay and presented with calm affect.  She described feeling somewhat better patient was which she attributes to decreasing salt and sugar in her diet.  Processed patient's continued stress associated with eligibility for transplant and her difficulty finding someone in the community for regular psychotherapy.  Discussed the potential benefits of our monthly sessions as a time in which she can work on pain coping as well as adjustment to transplant and the associated fear. Processed patient's current feelings regarding transplant including both excitement and feeling afraid. Normalized patient feeling both and reviewed current coping.    Extra time was spent introducing the potential benefits of grounding exercises. Reviewed and processed Noticing Five Things.      Objective / Mental Status Exam:  Speech/Language:    Normal rate, volume, tone, fluency   Mood:   Depressed and Anxious   Thought process:   Logical, linear, clear, coherent, goal directed   Thought content:    Denies SI, HI, self harm, delusions, obsessions, paranoid ideation, or ideas of reference   Perceptual disturbances:    Denies auditory and visual hallucinations, behavior not concerning for response to internal stimuli   Orientation:  Oriented to person, place, time, and general circumstances   Attention:   Able to fully attend without fluctuations in consciousness   Concentration:   Able to fully concentrate and attend   Memory:   Immediate, short-term, long-term, and recall grossly intact    Fund of knowledge:    Consistent with level of education and development   Insight:     Fair   Judgment:    Intact   Impulse Control:   Intact       Assessment:  Ms.  Lucci participated well in this Acceptance and Commitment Therapy (ACT) based session. She was engaged throughout session and willingly discussed difficult thoughts and feelings.    Diagnostic Impression:   Anxiety  Persistent depressive disorder      Plan:    [name] will follow up for teletherapy in approximately one month. Colon Branch, PhD  Clinical Pain Psychologist

## 2023-02-05 NOTE — Unmapped (Signed)
I discussed this patient's case including the history, clinical findings and assessment and plan with the resident physician. I reviewed the resident's note. I agree with the plan as documented in the resident's note. I was immediately available.     Keriann Rankin V Stevie Ertle, MD

## 2023-02-14 NOTE — Unmapped (Signed)
error 

## 2023-02-14 NOTE — Unmapped (Signed)
[facility] Refill Coordination Note    Specialty Medication(s) to be Shipped:   Inflammatory Disorders: Cosentyx    Other medication(s) to be shipped: No additional medications requested for fill at this time     Megan Robertson, DOB: 02/25/1971  Phone: [phone] (home)       All above HIPAA information was verified with patient.     Was a Nurse, learning disability used for this call? No    Completed refill call assessment today to schedule patient's medication shipment from the [facility] [phone]).  All relevant notes have been reviewed.     Specialty medication(s) and dose(s) confirmed: Regimen is correct and unchanged.   Changes to medications: Megan Robertson reports no changes at this time.  Changes to insurance: No  New side effects reported not previously addressed with a pharmacist or physician: None reported  Questions for the pharmacist: No    Confirmed patient received a Conservation officer, historic buildings and a Surveyor, mining with first shipment. The patient will receive a drug information handout for each medication shipped and additional FDA Medication Guides as required.       DISEASE/MEDICATION-SPECIFIC INFORMATION        For patients on injectable medications: Patient currently has 0 doses left.  Next injection is scheduled for 02/24/23.    SPECIALTY MEDICATION ADHERENCE     Medication Adherence    Specialty Medication: secukinumab (COSENTYX PEN, 2 PENS,) 150 mg/mL PnIj injection              Were doses missed due to medication being on hold? No    COSENTYX PEN (2 PENS) 150  mg/ml: 0 days of medicine on hand       REFERRAL TO PHARMACIST     Referral to the pharmacist: Not needed      [facility]     Shipping address confirmed in Epic.     Patient was notified of new phone menu : No    Delivery Scheduled: Yes, Expected medication delivery date: 02/21/23.     Medication will be delivered via Same Day Courier to the prescription address in Epic WAM.    Alwyn Pea   [facility] Specialty Technician

## 2023-03-04 NOTE — Unmapped (Signed)
Pharmacy Pre-transplant Evaluation/ Selection Committee Note    Evaluation for kidney transplant    Patient:  Megan Robertson, Age:  52 y.o. old, Gender:  female    Allergies:  Cephalexin, Mango, Peach (prunus persica), Wellbutrin [bupropion hcl], Egg, Fish containing products, Lactase-rennet, Lexiscan [regadenoson], Mushroom, and Tomato (solanum lycopersicum)    Immunization history:    Immunization History   Administered Date(s) Administered    INFLUENZA TIV (TRI) 50MO+ W/ PRESERV (IM) 08/08/2011    PNEUMOCOCCAL POLYSACCHARIDE 23-VALENT 11/23/2015    TdaP 10/07/2019       Alcohol, tobacco, illicit drug use history:  Social History     Substance and Sexual Activity   Alcohol Use Not Currently     Social History     Tobacco Use   Smoking Status Former    Current packs/day: 0.00    Average packs/day: 2.0 packs/day for 1 year (2.0 ttl pk-yrs)    Types: Cigarettes    Start date: 06/30/2009    Quit date: 06/30/2010    Years since quitting: 12.6   Smokeless Tobacco Never     Social History     Substance and Sexual Activity   Drug Use Not Currently       Home medications:  Current Outpatient Medications   Medication Instructions    allopurinol (ZYLOPRIM) 100 mg, Oral, Tue-Thur-Sat    amlodipine (NORVASC) 10 mg, Oral, Daily (standard)    apixaban (ELIQUIS) 5 mg, Oral, Daily    aspirin 81 mg, Oral, Daily (standard)    atorvastatin (LIPITOR) 80 mg, Oral, Daily (standard)    beclomethasone dipropionate (QVAR REDIHALER) 80 mcg/actuation inhaler 1 puff every morning, 2 puffs every evening    calcitriol (ROCALTROL) 1 mcg, Oral, Daily (standard)    cinacalcet (SENSIPAR) 90 mg, Oral, Daily (standard)    colchicine (MITIGARE) 0.6 mg cap capsule Oral    evolocumab (REPATHA SURECLICK) 140 mg/mL PnIj Inject the contents of 1 pen (140 mg) under the skin every fourteen (14) days.    fluocinolone 0.01 % external oil Topical, Daily PRN, Use for psoriasis and scalp scaling.    furosemide (LASIX) 40 mg, Oral, 3 times weekly, To be taken once a day if you develop leg swelling.    isosorbide mononitrate (IMDUR) 120 mg, Oral, Daily (standard)    naltrexone 4.5 mg capsule 1 capsule, Oral, Nightly    NON FORMULARY Low-dose Naltrexone 1.5 mg. Take 1 capsule PO nightly.    ondansetron (ZOFRAN) 4 mg, Oral, Every 6 hours    pregabalin (LYRICA) 50 mg, Oral, 3 times a day (standard)    secukinumab (COSENTYX PEN, 2 PENS,) 150 mg/mL PnIj injection Inject the contents of 2 pens (300 mg total) under the skin every twenty-eight (28) days. Maintenance dose.    sevelamer (RENVELA) 800 mg, Oral, 3 times a day (with meals)    tizanidine (ZANAFLEX) 2 mg, Oral, Daily PRN    triamcinolone (KENALOG) 0.1 % ointment Topical, 2 times a day PRN, Use on psoriasis until flat.       Potential pharmacotherapy related concerns for transplantation:  1. anticoagulation concerns:  Apixaban  - AVF clotting  2. cytochrome P-450 isoenzyme-mediated drug interactions:   none  3. other drug-interactions with medications post-transplant:  none  4. mental health-related medication(s):   none  5. chronic pain-related medication use:  none  6. use of hormonal contraception and replacement therapy:  none  7. prior or current use of immunosuppressants:none    8. issues with drug absorption: none  9. use of herbal supplements:  none to transplant team's knowledge    Non-adherence concerns post-transplantation based on pre-transplant evaluation assessment:  no     Recommendations:  1. Transition to warfarin as approaches transplant.      Spent 10 minutes completing medication review and addressing any pharmacological concerns with members of the multidisciplinary transplant team.    Thank you,  Olivia Mackie, CPP, PharmD

## 2023-03-11 ENCOUNTER — Ambulatory Visit: Admit: 2023-03-11 | Payer: MEDICARE | Attending: Clinical | Primary: Clinical

## 2023-03-13 NOTE — Unmapped (Signed)
Called patient and attempted to discuss team's decision.  No answer, LM.  Case closed letter sent via MyChart and regular USPS mail to patient and sent via fax to dialysis unit.

## 2023-04-02 NOTE — Unmapped (Signed)
Patient returned call and LM.  Called patient back and had to leave another message.

## 2023-04-10 ENCOUNTER — Telehealth: Admit: 2023-04-10 | Discharge: 2023-04-11 | Attending: Clinical | Primary: Clinical

## 2023-04-10 DIAGNOSIS — E041 Nontoxic single thyroid nodule: Principal | ICD-10-CM

## 2023-04-10 NOTE — Unmapped (Signed)
Confidential Telehealth Psychology Note  [facility]       Patient Name: Megan Robertson  Medical Record Number: [id number]  Date of Service: April 10, 2023  Attending Psychologist: Colon Branch, PhD   CPT Procedure Code: 81191 for 60 minutes of audio/visual based counseling   Therapy Type: Behavior Modifying/Acceptance and Commitment Therapy (ACT)  Purpose of Treatment: reduce depression symptoms, reduce anxiety symptoms, improve quality of life, improve chronic pain coping    Subjective:   This visit was performed face to face with interactive technology using a HIPPA compliant audio/visual platform. We reviewed confidentiality today. The patient was present in [place], a state in which this provider is licensed and able to provide care (location and contact information confirmed), attended this visit alone, and consented to this virtual pain psychology visit.    Ms.  Hack is a pleasant 52 y.o.  female with chronic pain and anxiety.  She is being seen via telehealth for this visit. Pt described her mood as ok and presented with slightly anxious affect. She described continued pain which is more constant. Discussed patient's fear with current status of her kidneys. Also discussed the possibility of monthly psychotherapy with me as an option for meeting criteria for transplant. Patient expressed interest and willingness to participate in psychotherapy and recognizes the potential benefits.    Extra time was spent processing patient's fears about transplant, whenever she is eligible. At the same time, patient recognizes the potential benefits of the surgery. Explored the potential benefits of opening up to her family given their current concerns and the importance of leaning on others even as the strong one. Willingness to be vulnerable was discussed and encouraged.    Objective / Mental Status Exam:  Speech/Language:    Normal rate, volume, tone, fluency   Mood: Depressed and Anxious   Thought process:   Logical, linear, clear, coherent, goal directed   Thought content:    Denies SI, HI, self harm, delusions, obsessions, paranoid ideation, or ideas of reference   Perceptual disturbances:    Denies auditory and visual hallucinations, behavior not concerning for response to internal stimuli   Orientation:  Oriented to person, place, time, and general circumstances   Attention:   Able to fully attend without fluctuations in consciousness   Concentration:   Able to fully concentrate and attend   Memory:   Immediate, short-term, long-term, and recall grossly intact    Fund of knowledge:    Consistent with level of education and development   Insight:     Fair   Judgment:    Intact   Impulse Control:   Intact       Assessment:  Ms.  Witts participated well in this Acceptance and Commitment Therapy (ACT) based session. She was engaged throughout session and willingly discussed difficult thoughts and feelings.    Diagnostic Impression:   Anxiety  Persistent depressive disorder      Plan:    [name] will follow up for teletherapy in approximately one month. Colon Branch, PhD, DBSM  Clinical Pain Psychologist

## 2023-05-03 NOTE — Unmapped (Addendum)
[facility] Refill Coordination Note    Specialty Medication(s) to be Shipped:   Inflammatory Disorders: Cosentyx    Other medication(s) to be shipped: No additional medications requested for fill at this time     Megan Robertson, DOB: 05-24-1971  Phone: There are no phone numbers on file.      All above HIPAA information was verified with patient.     Was a Nurse, learning disability used for this call? No    Completed refill call assessment today to schedule patient's medication shipment from the [facility] [phone]).  All relevant notes have been reviewed.     Specialty medication(s) and dose(s) confirmed: Regimen is correct and unchanged.   Changes to medications: Megan Robertson reports no changes at this time.  Changes to insurance: No  New side effects reported not previously addressed with a pharmacist or physician: None reported  Questions for the pharmacist: No    Confirmed patient received a Conservation officer, historic buildings and a Surveyor, mining with first shipment. The patient will receive a drug information handout for each medication shipped and additional FDA Medication Guides as required.       DISEASE/MEDICATION-SPECIFIC INFORMATION        For patients on injectable medications: Patient currently has 0 doses left.  Next injection is scheduled for 07/16.-asap    SPECIALTY MEDICATION ADHERENCE     Medication Adherence    Patient reported X missed doses in the last month: 0  Specialty Medication: secukinumab (COSENTYX PEN, 2 PENS,) 150 mg/mL PnIj injection  Patient is on additional specialty medications: No  Patient is on more than two specialty medications: No  Any gaps in refill history greater than 2 weeks in the last 3 months: no  Demonstrates understanding of importance of adherence: yes  Informant: patient  Reliability of informant: reliable  Provider-estimated medication adherence level: good  Patient is at risk for Non-Adherence: No  Reasons for non-adherence: no problems identified  Confirmed plan for next specialty medication refill: delivery by pharmacy  Refills needed for supportive medications: not needed          Refill Coordination    Has the Patients' Contact Information Changed: No  Is the Shipping Address Different: No         Were doses missed due to medication being on hold? No    cosentyx 150 mg/ml: 0 days of medicine on hand       REFERRAL TO PHARMACIST     Referral to the pharmacist: Not needed      [facility]     Shipping address confirmed in Epic.       Delivery Scheduled: Yes, Expected medication delivery date: 07/16.     Medication will be delivered via Same Day Courier to the prescription address in Epic WAM.    Antonietta Barcelona   [facility] Specialty Technician

## 2023-05-06 MED ORDER — HEPARIN (PORCINE) 1,000 UNIT/ML INJECTION SOLUTION
0 refills | 0 days
Start: 2023-05-06 — End: ?

## 2023-05-06 NOTE — Unmapped (Signed)
Heparin refill refused because patient is on dialysis. Pharmacy notified to send refill request to dialysis unit.

## 2023-05-07 MED FILL — COSENTYX PEN 300 MG/2 PENS (150 MG/ML) SUBCUTANEOUS: SUBCUTANEOUS | 56 days supply | Qty: 4 | Fill #1

## 2023-05-08 DIAGNOSIS — R0789 Other chest pain: Principal | ICD-10-CM

## 2023-05-08 DIAGNOSIS — E212 Other hyperparathyroidism: Principal | ICD-10-CM

## 2023-05-08 MED ORDER — QVAR REDIHALER 80 MCG/ACTUATION HFA BREATH ACTIVATED AEROSOL
Freq: Two times a day (BID) | RESPIRATORY_TRACT | 3 refills | 0 days | Status: CP
Start: 2023-05-08 — End: ?

## 2023-05-08 MED ORDER — ALBUTEROL SULFATE HFA 90 MCG/ACTUATION AEROSOL INHALER
Freq: Four times a day (QID) | RESPIRATORY_TRACT | 3 refills | 0 days | Status: CP | PRN
Start: 2023-05-08 — End: 2024-05-07

## 2023-05-08 NOTE — Unmapped (Signed)
Evaluated patient in home therapies clinic in [place]. Sent refills of medications (inhalers). Stopping hydralazine and colchicine (inappropriately dosed for renal function).

## 2023-05-09 ENCOUNTER — Telehealth: Admit: 2023-05-09 | Discharge: 2023-05-10 | Attending: Clinical | Primary: Clinical

## 2023-05-09 NOTE — Unmapped (Signed)
Confidential Telehealth Psychology Note  [facility]       Patient Name: FELICHA MOFFA  Medical Record Number: [id number]  Date of Service: May 09, 2023  Attending Psychologist: Colon Branch, PhD   CPT Procedure Code: 72536 for 45 minutes of audio/visual based counseling   Therapy Type: Behavior Modifying/Acceptance and Commitment Therapy (ACT)  Purpose of Treatment: reduce depression symptoms, reduce anxiety symptoms, improve quality of life, improve chronic pain coping    Subjective:   This visit was performed face to face with interactive technology using a HIPPA compliant audio/visual platform. We reviewed confidentiality today. The patient was present in [place], a state in which this provider is licensed and able to provide care (location and contact information confirmed), attended this visit alone, and consented to this virtual pain psychology visit.    Ms.  Halls is a pleasant 52 y.o.  female with chronic pain and anxiety.  She is being seen via telehealth for this visit. Pt described her mood as I'm doing and presented with slightly anxious affect. She described frustration with sleep with recent medication change. Also processed patient's frustration with insurance difficulties. Tracked how patient is spending her days and the impact of minimal behavioral activation.    Extra time was spent discussing patient's current daytime napping of many hours and we reviewed the impact this is likely having on her sleep at night. Identified strategies patient can implement to limit her daytime naps including setting alarm and finding motivation in talking to her grandchildren afater a nap.    Objective / Mental Status Exam:  Speech/Language:    Normal rate, volume, tone, fluency   Mood:   Depressed and Anxious   Thought process:   Logical, linear, clear, coherent, goal directed   Thought content:    Denies SI, HI, self harm, delusions, obsessions, paranoid ideation, or ideas of reference   Perceptual disturbances:    Denies auditory and visual hallucinations, behavior not concerning for response to internal stimuli   Orientation:  Oriented to person, place, time, and general circumstances   Attention:   Able to fully attend without fluctuations in consciousness   Concentration:   Able to fully concentrate and attend   Memory:   Immediate, short-term, long-term, and recall grossly intact    Fund of knowledge:    Consistent with level of education and development   Insight:     Fair   Judgment:    Intact   Impulse Control:   Intact       Assessment:  Ms.  Jackovich participated well in this Acceptance and Commitment Therapy (ACT) based session. She was engaged throughout session and willingly discussed difficult thoughts and feelings.    Diagnostic Impression:   Anxiety  Persistent depressive disorder      Plan:    Ms. Boven will follow up for teletherapy in approximately one month. Colon Branch, PhD, DBSM  Clinical Pain Psychologist

## 2023-05-20 DIAGNOSIS — R0789 Other chest pain: Principal | ICD-10-CM

## 2023-05-20 MED ORDER — QVAR REDIHALER 80 MCG/ACTUATION HFA BREATH ACTIVATED AEROSOL
Freq: Two times a day (BID) | RESPIRATORY_TRACT | 3 refills | 0.00000 days | Status: CP
Start: 2023-05-20 — End: 2023-05-20

## 2023-05-20 NOTE — Unmapped (Signed)
Addended by: Clyde Lundborg on: 05/20/2023 02:57 PM     Modules accepted: Orders

## 2023-05-20 NOTE — Unmapped (Signed)
Addended by: Clyde Lundborg on: 05/20/2023 02:59 PM     Modules accepted: Orders

## 2023-05-27 ENCOUNTER — Ambulatory Visit: Admit: 2023-05-27 | Discharge: 2023-05-27 | Payer: MEDICARE

## 2023-05-29 DIAGNOSIS — I319 Disease of pericardium, unspecified: Principal | ICD-10-CM

## 2023-05-31 DIAGNOSIS — E039 Hypothyroidism, unspecified: Principal | ICD-10-CM

## 2023-06-03 ENCOUNTER — Ambulatory Visit
Admit: 2023-06-03 | Discharge: 2023-06-11 | Disposition: A | Discharge status: Home Health | Payer: MEDICARE | Admitting: Nephrology

## 2023-06-03 ENCOUNTER — Ambulatory Visit: Admit: 2023-06-03 | Discharge: 2023-06-11 | Payer: MEDICARE

## 2023-06-03 ENCOUNTER — Ambulatory Visit: Admit: 2023-06-03 | Payer: MEDICARE

## 2023-06-03 ENCOUNTER — Encounter: Admit: 2023-06-03 | Payer: MEDICARE

## 2023-06-03 LAB — COMPREHENSIVE METABOLIC PANEL
ALBUMIN: 3.7 g/dL (ref 3.4–5.0)
ALKALINE PHOSPHATASE: 112 U/L (ref 46–116)
ANION GAP: 18 mmol/L — ABNORMAL HIGH (ref 5–14)
BILIRUBIN TOTAL: 0.5 mg/dL (ref 0.3–1.2)
BLOOD UREA NITROGEN: 49 mg/dL — ABNORMAL HIGH (ref 9–23)
BUN / CREAT RATIO: 4
CALCIUM: 7.3 mg/dL — ABNORMAL LOW (ref 8.7–10.4)
CHLORIDE: 98 mmol/L (ref 98–107)
CO2: 20 mmol/L (ref 20.0–31.0)
CREATININE: 12.64 mg/dL — ABNORMAL HIGH
EGFR CKD-EPI (2021) FEMALE: 3 mL/min/{1.73_m2} — ABNORMAL LOW (ref >=60)
GLUCOSE RANDOM: 111 mg/dL (ref 70–179)
PROTEIN TOTAL: 7.8 g/dL (ref 5.7–8.2)
SODIUM: 136 mmol/L (ref 135–145)

## 2023-06-03 LAB — CBC W/ AUTO DIFF
BASOPHILS ABSOLUTE COUNT: 0.2 10*9/L — ABNORMAL HIGH (ref 0.0–0.1)
BASOPHILS RELATIVE PERCENT: 0.9 %
EOSINOPHILS ABSOLUTE COUNT: 0.1 10*9/L (ref 0.0–0.5)
EOSINOPHILS RELATIVE PERCENT: 0.3 %
HEMATOCRIT: 31.7 % — ABNORMAL LOW (ref 34.0–44.0)
HEMOGLOBIN: 10.8 g/dL — ABNORMAL LOW (ref 11.3–14.9)
LYMPHOCYTES ABSOLUTE COUNT: 3.5 10*9/L (ref 1.1–3.6)
LYMPHOCYTES RELATIVE PERCENT: 20.7 %
MEAN CORPUSCULAR HEMOGLOBIN CONC: 34 g/dL (ref 32.0–36.0)
MEAN CORPUSCULAR HEMOGLOBIN: 29.7 pg (ref 25.9–32.4)
MEAN CORPUSCULAR VOLUME: 87.4 fL (ref 77.6–95.7)
MONOCYTES ABSOLUTE COUNT: 0.9 10*9/L — ABNORMAL HIGH (ref 0.3–0.8)
MONOCYTES RELATIVE PERCENT: 5.6 %
NEUTROPHILS ABSOLUTE COUNT: 12.3 10*9/L — ABNORMAL HIGH (ref 1.8–7.8)
NEUTROPHILS RELATIVE PERCENT: 72.5 %
PLATELET COUNT: >437 10*9/L (ref 150–450)
RED BLOOD CELL COUNT: 3.63 10*12/L — ABNORMAL LOW (ref 3.95–5.13)
RED CELL DISTRIBUTION WIDTH: 13.8 % (ref 12.2–15.2)
WBC ADJUSTED: 17 10*9/L — ABNORMAL HIGH (ref 3.6–11.2)

## 2023-06-03 LAB — HIGH SENSITIVITY TROPONIN I - 2 HOUR SERIAL
HIGH SENSITIVITY TROPONIN - DELTA (0-2H): 0 ng/L (ref <=7)
HIGH-SENSITIVITY TROPONIN I - 2 HOUR: 4 ng/L (ref <=34)

## 2023-06-03 LAB — SLIDE REVIEW

## 2023-06-03 LAB — PHOSPHORUS: PHOSPHORUS: 7.5 mg/dL — ABNORMAL HIGH (ref 2.4–5.1)

## 2023-06-03 LAB — HIGH SENSITIVITY TROPONIN I - SERIAL: HIGH SENSITIVITY TROPONIN I: 4 ng/L (ref <=34)

## 2023-06-03 LAB — D-DIMER, QUANTITATIVE: D-DIMER QUANTITATIVE (CW,ML,HL,HS,CH,JS,JC,RX,RH): 816 ng{FEU}/mL — ABNORMAL HIGH (ref <=500)

## 2023-06-03 LAB — SEDIMENTATION RATE: ERYTHROCYTE SEDIMENTATION RATE: 97 mm/h — ABNORMAL HIGH (ref 0–30)

## 2023-06-03 MED ADMIN — sodium chloride 0.9% (NS) bolus 250 mL: 250 mL | INTRAVENOUS | @ 23:00:00 | Stop: 2023-06-03

## 2023-06-04 LAB — URINALYSIS WITH MICROSCOPY
BILIRUBIN UA: NEGATIVE
GLUCOSE UA: NEGATIVE
HYALINE CASTS: 1 /LPF (ref 0–1)
KETONES UA: NEGATIVE
LEUKOCYTE ESTERASE UA: NEGATIVE
NITRITE UA: NEGATIVE
PH UA: 6 (ref 5.0–9.0)
PROTEIN UA: 300 — AB
RBC UA: 2 /HPF (ref <=4)
SPECIFIC GRAVITY UA: 1.015 (ref 1.003–1.030)
SQUAMOUS EPITHELIAL: 9 /HPF — ABNORMAL HIGH (ref 0–5)
UROBILINOGEN UA: <2
WBC UA: 7 /HPF — ABNORMAL HIGH (ref 0–5)

## 2023-06-04 LAB — MAGNESIUM: MAGNESIUM: 1.5 mg/dL — ABNORMAL LOW (ref 1.6–2.6)

## 2023-06-04 LAB — BASIC METABOLIC PANEL
ANION GAP: 15 mmol/L — ABNORMAL HIGH (ref 5–14)
BLOOD UREA NITROGEN: 51 mg/dL — ABNORMAL HIGH (ref 9–23)
BUN / CREAT RATIO: 4
CALCIUM: 6.8 mg/dL — ABNORMAL LOW (ref 8.7–10.4)
CHLORIDE: 103 mmol/L (ref 98–107)
CO2: 24 mmol/L (ref 20.0–31.0)
CREATININE: 13.57 mg/dL — ABNORMAL HIGH
EGFR CKD-EPI (2021) FEMALE: 3 mL/min/{1.73_m2} — ABNORMAL LOW (ref >=60)
GLUCOSE RANDOM: 91 mg/dL (ref 70–179)
POTASSIUM: 2.7 mmol/L — ABNORMAL LOW (ref 3.4–4.8)
SODIUM: 142 mmol/L (ref 135–145)

## 2023-06-04 LAB — AST: AST (SGOT): 11 U/L (ref <=34)

## 2023-06-04 LAB — CBC
HEMATOCRIT: 27.9 % — ABNORMAL LOW (ref 34.0–44.0)
HEMOGLOBIN: 9 g/dL — ABNORMAL LOW (ref 11.3–14.9)
MEAN CORPUSCULAR HEMOGLOBIN CONC: 32.3 g/dL (ref 32.0–36.0)
MEAN CORPUSCULAR HEMOGLOBIN: 28.9 pg (ref 25.9–32.4)
MEAN CORPUSCULAR VOLUME: 89.4 fL (ref 77.6–95.7)
MEAN PLATELET VOLUME: 7.2 fL (ref 6.8–10.7)
PLATELET COUNT: 303 10*9/L (ref 150–450)
RED BLOOD CELL COUNT: 3.12 10*12/L — ABNORMAL LOW (ref 3.95–5.13)
RED CELL DISTRIBUTION WIDTH: 13.8 % (ref 12.2–15.2)
WBC ADJUSTED: 12.1 10*9/L — ABNORMAL HIGH (ref 3.6–11.2)

## 2023-06-04 LAB — C-REACTIVE PROTEIN: C-REACTIVE PROTEIN: 61 mg/L — ABNORMAL HIGH (ref <=10.0)

## 2023-06-04 LAB — HIGH SENSITIVITY TROPONIN I - 6 HOUR SERIAL
HIGH SENSITIVITY TROPONIN - DELTA (2-6H): 0 ng/L (ref <=7)
HIGH-SENSITIVITY TROPONIN I - 6 HOUR: 4 ng/L (ref <=34)

## 2023-06-04 LAB — LACTATE, VENOUS, WHOLE BLOOD: LACTATE BLOOD VENOUS: 1 mmol/L (ref 0.5–1.8)

## 2023-06-04 LAB — B-TYPE NATRIURETIC PEPTIDE: B-TYPE NATRIURETIC PEPTIDE: 3 pg/mL (ref <=100)

## 2023-06-04 LAB — ALT: ALT (SGPT): <7 U/L — ABNORMAL LOW (ref 10–49)

## 2023-06-04 LAB — PHOSPHORUS: PHOSPHORUS: 10.2 mg/dL — ABNORMAL HIGH (ref 2.4–5.1)

## 2023-06-04 MED ADMIN — sevelamer (RENVELA) tablet 800 mg: 800 mg | ORAL | @ 18:00:00

## 2023-06-04 MED ADMIN — sodium chloride 0.9% (NS) bolus 250 mL: 250 mL | INTRAVENOUS | @ 03:00:00 | Stop: 2023-06-03

## 2023-06-04 MED ADMIN — aspirin chewable tablet 81 mg: 81 mg | ORAL | @ 13:00:00

## 2023-06-04 MED ADMIN — sevelamer (RENVELA) tablet 800 mg: 800 mg | ORAL | @ 22:00:00

## 2023-06-04 MED ADMIN — atorvastatin (LIPITOR) tablet 80 mg: 80 mg | ORAL | @ 13:00:00

## 2023-06-04 MED ADMIN — fluticasone furoate (ARNUITY ELLIPTA) 100 mcg/actuation inhaler 1 puff: 1 | RESPIRATORY_TRACT | @ 12:00:00

## 2023-06-04 MED ADMIN — potassium chloride ER tablet 40 mEq: 40 meq | ORAL | @ 13:00:00 | Stop: 2023-06-04

## 2023-06-04 MED ADMIN — apixaban (ELIQUIS) tablet 5 mg: 5 mg | ORAL | @ 12:00:00

## 2023-06-04 MED ADMIN — calcitriol (ROCALTROL) capsule 1 mcg: 1 ug | ORAL | @ 13:00:00

## 2023-06-04 MED ADMIN — magnesium oxide (MAG-OX) tablet 200 mg: 200 mg | ORAL | @ 13:00:00 | Stop: 2023-06-04

## 2023-06-04 MED ADMIN — cefepime (MAXIPIME) 1 g in sodium chloride 0.9 % (NS) 100 mL IVPB-MBP: 1 g | INTRAVENOUS | @ 07:00:00 | Stop: 2023-06-04

## 2023-06-04 MED ADMIN — vancomycin (VANCOCIN) 1500 mg in sodium chloride (NS) 0.9 % 500 mL IVPB (premix): 1500 mg | INTRAVENOUS | @ 07:00:00 | Stop: 2023-06-04

## 2023-06-04 NOTE — Unmapped (Addendum)
Nephrology ESKD Consult Note    Requesting provider: Greggory Keen, MD  Service requesting consult: Emergency Medicine  Reason for consult: Evaluation for dialysis needs    Outpatient dialysis unit:   [facility]  [address] [place] [address]     Assessment/Recommendations: Megan Robertson is a 52 y.o. 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    # ESKD:   Outpatient Peritoneal Dialysis Prescription   PD Modality: CCPD or CAPD: CCPD   Number of exchanges: 4 Fill Volume: 2000 mL   Total therapy time: 660 minutes Dialysate Concentration: 2.5% dextrose or 1.5% dextrose   Last Fill:  1000 mL.  Last Fill Concentration: 2.5% dextrose or 1.5% dextrose   Mid-day Exchange: Not applicable.  Mid-day Fill Concentration: not applicable   If Tidal Rx, % Tidal: Not applicable.       - Outpatient dialysis records reviewed; we will plan to continue the same prescription  - Indication for acute dialysis?: No  - We will hold PD tonight  given hypotension and pt feeling like she is near her dry weight. Pt is not on oxygen and there are no other indications (hyperk, acidosis) to emergently dialyze tonight. Barring left CP angle blunting on CXR, no evidence of overt pulmonary edema. We will otherwise attempt to continue pt's outpatient schedule if possible.   - Access: PD Catheter; PD catheter functioning well- no indication for intervention.    #Concern for pericardial effusion   - Per ED provider, concern for effusion on bedside POCUS. Would recommend formal ECHO to eval for pericardial effusion/tamponade like physiology.     #Concern for sepsis   - PD fluid studies will be drawn tonight - pt has a last fill of 1L  - Recommend broad spectrum abx, infection work up    # Volume / Hypertension: EDW 83.5 kg  - Volume: Will be cautious with fluid removal in the setting of low blood pressure  - Will adjust daily as needed    # Acid-Base / Electrolytes:   - Will evaluate acid-base status and electrolyte balance daily and adjust dialysate as needed.  Lab Results   Component Value Date    K 3.0 (L) 09/19/2022    CO2 21.0 09/19/2022       # Anemia of Chronic Kidney Disease:   - Will continue outpatient management with ESA and adjust as needed.  Lab Results   Component Value Date    HGB 11.2 (L) 09/19/2022       # Secondary Hyperparathyroidism/Hyperphosphatemia:   - Continue outpatient phosphorus binders and adjust as needed.   Lab Results   Component Value Date    PHOS 4.2 09/19/2022    CALCIUM 8.1 (L) 09/19/2022       #Generalized weakness, hypotension  - Evaluation and management per primary team  - No changes to management from a nephrology standpoint at this time    Formal consult to follow in AM if patient is admitted.    Donato Heinz, MD   Nephrology Fellow

## 2023-06-04 NOTE — Unmapped (Signed)
[facility]  Emergency Department Provider Note     HPI     Megan Robertson is a very pleasant 52 y.o. female  has a past medical history of Abnormal mammogram, Abnormal Pap smear of cervix, Allergic, Anemia, Anxiety, Arthritis, Asthma, Back pain, Cancer (CMS-HCC) (Ovarian), Depression, Diabetes mellitus (CMS-HCC) (Borderline), Eczema, ESRD (end stage renal disease) on dialysis (CMS-HCC), FSGS (focal segmental glomerulosclerosis) (1997), GERD (gastroesophageal reflux disease), Gout, H/O adenoidectomy, Hypercholesteremia, Hypertension, Keloid, Ovarian cancer (CMS-HCC), Pancreatitis, Psoriasis, Seizure (CMS-HCC), and Stroke (CMS-HCC) (2000).  Presents to the emergency room for evaluation of chest pain and shortness of breath that has been going on since this morning.  She states she felt this way upon waking up starting with chest pain progressing shortness of breath.  She describes the chest pain as a dull this that is worse with lying down and better when sitting up.  This happened about 2 weeks ago when during dialysis they put in her dialysate and was having similar symptoms.  After removing the  dialysate, she had improvement in symptoms.  During this period of time from 2 weeks ago she has also been having hypotension in the setting of drug-resistant hypertension.  She discontinued her antihypertensive medication.  Up until this morning she was having no symptoms.  She denies fever, chills, diaphoresis, leg pain, abdominal pain.    She normally does her dialysis at night and she has recently changed from 2.5% to 1.5% dialysate fluid concentration due to this change in blood pressure.  Speaking to her dialysis office they state that they were working her up for uremic pericarditis due to the event that happened 2 weeks ago.    She also has a history of anxiety of which her dialysis clinic believes is partially contributing.      MDM     BP 105/64  - Pulse 90  - Temp 36.6 ??C (97.9 ??F) (Oral)  - Resp (!) 45  - Wt 79 kg (174 lb 2.6 oz)  - LMP  (LMP Unknown)  - SpO2 100%  - BMI 31.85 kg/m??      Patient hypotensive however alert oriented x 4 on exam she is tachypneic but is saturating 100% on room air, she was in moderate distress during initial evaluation however after 5 minutes of the left alone she calmed down and was resting comfortably.  Labs show a metabolic acidosis with anion gap with partial respiratory compensation.  ED POCUS showed moderate pericardial effusion.  Ddx: Uremic pericarditis, anxiety, SBP, cardiogenic chest pain, CH. diagnostic workup as below. Will treat patient with small amount of IV fluids. Disposition pending further work-up. See progress notes below.     Orders Placed This Encounter   Procedures    Body fluid cell count    Dialysis Fluid Culture    Respiratory Pathogen Panel with COVID    Blood Culture #1    Blood Culture #2    XR Chest Portable    ED POCUS    D-Dimer, Quantitative    hsTroponin I (serial 0-2-6H w/ delta)    CBC w/ Differential    Comprehensive Metabolic Panel    hsTroponin I - 2 Hour    hsTroponin I - 6 Hour    B-type natriuretic peptide (BNP)    Potassium Level    AST    ALT    Sedimentation Rate    C-reactive protein    Lactate, Venous, Whole Blood    Vital signs    Vital signs  Notify Provider    RN to notify pharmacy immediately that an antibiotic has been ordered from the Sepsis Order Set (if not available in the [employer] or if not yet verified)    Inpatient consult to Nephrology    ECG 12 Lead    ED Admit Decision       ED Course as of 06/03/23 2206   Mon Jun 03, 2023   1823 Mild to moderate effusion noted on pocus, no tamponade or collection large enough to cause hemodynamic instability.   1927 D-Dimer(!): 816  Elevated D-dimer, patient is not presenting clinically as though she has a pulmonary embolus as she is satting 100% on room air, she was put on 2 L nasal cannula for comfort due to her anxiety.  Patient currently has some renal function, risk of CT PE with contrast threatens remaining kidney function.  Due to the low clinical suspicion for pulmonary embolus and risk of kidney injury CT PE deferred at this time.   2206 MAO paged       The case was discussed with the attending physician who is in agreement with the above assessment and plan.    - Any discussion of this patient's case/presentation between myself and consultants, admitting teams, or other team members has been documented above.  - Imaging and other studies, if performed, that were available during my care of the patient were independently reviewed and interpreted by me and considered in my medical decision making as documented above.  - External records reviewed: reviewed previous notes  - Consideration of admission, observation, transfer, or escalation of care: need for IV abx, further evaluation for hypotension     ED Clinical Impression     Final diagnoses:   Hypotension, unspecified hypotension type (Primary)        Physical Exam     Vitals:    06/03/23 1733 06/03/23 1900 06/03/23 2000 06/03/23 2037   BP: 86/62 113/63 105/64    Pulse:  83 90    Resp: 28 (!) 45     Temp: 36.5 ??C (97.7 ??F)  36.6 ??C (97.9 ??F)    TempSrc: Oral  Oral    SpO2: 100% 100% 100%    Weight:    79 kg (174 lb 2.6 oz)        Constitutional: In moderate distress, hyperventilating.  Eyes: Conjunctivae are normal. EOMI.   HEENT: Normocephalic and atraumatic. Mucous membranes are moist.   Neck: Full active range of motion  Cardiovascular: See heart rate listed above.  No murmurs appreciated.  Normal skin perfusion.  2+ pulses in distal extremities, no lower extremity edema. TTP to the anterior chest wall  Respiratory: See respiratory rate listed above.  Lungs are clear to auscultation bilaterally.  Speaking easily in full sentences  Gastrointestinal: Soft, non-distended, non-tender.  No guarding.  Musculoskeletal: No long bone deformities.   Neurologic: Normal speech and language. No gross focal neurologic deficits are appreciated. Skin: Skin is warm, dry and intact.  Psychiatric: Mood and affect are normal.     Past History     PAST MEDICAL HISTORY/PAST SURGICAL HISTORY:   Past Medical History:   Diagnosis Date    Abnormal mammogram     Abnormal Pap smear of cervix     Allergic     Anemia     Anxiety     Arthritis     Asthma     Back pain     Cancer (CMS-HCC) Ovarian    Depression  Diabetes mellitus (CMS-HCC) Borderline    Eczema     ESRD (end stage renal disease) on dialysis (CMS-HCC)     FSGS (focal segmental glomerulosclerosis) 1997    renal biopsy at [facility]    GERD (gastroesophageal reflux disease)     Gout     H/O adenoidectomy     had adenoids removed    Hypercholesteremia     Hypertension     Keloid     Ovarian cancer (CMS-HCC)     Pancreatitis     Psoriasis     Seizure (CMS-HCC)     unknown etiology; none for several years    Stroke (CMS-HCC) 2000       Past Surgical History:   Procedure Laterality Date    APPENDECTOMY      BREAST EXCISIONAL BIOPSY Right 11/28/2016    CHEMOTHERAPY      for ovarian CA in 2000    CHG US GUIDE, VASCULAR ACCESS N/A 01/29/2020    Procedure: ULTRASOUND GUIDANCE FOR VASC ACCESS REQUIRING [place] EVAL OF POTENTIAL ACCESS SITES;  Surgeon: Leona Carry, MD;  Location: MAIN OR [facility];  Service: Transplant    CHG US GUIDE, VASCULAR ACCESS N/A 06/08/2020    Procedure: ULTRASOUND GUIDANCE FOR VASC ACCESS REQUIRING [place] EVAL OF POTENTIAL ACCESS SITES;  Surgeon: Leona Carry, MD;  Location: MAIN OR [facility];  Service: Transplant    CHOLECYSTECTOMY      COMBINED HYSTEROSCOPY DIAGNOSTIC / D&C  07/07/2015    Planned for endometrial ablation, but patient had uterine perforation after D&C and unable to perform    HYSTERECTOMY      LEFT OOPHORECTOMY Left 04/12/2000    Removed with L fallopian tube for ectopic pregnancy    OOPHORECTOMY      PR COLONOSCOPY W/BIOPSY SINGLE/MULTIPLE  09/12/2021    Procedure: COLONOSCOPY, FLEXIBLE, PROXIMAL TO SPLENIC FLEXURE; WITH BIOPSY, SINGLE OR MULTIPLE;  Surgeon: Maris Berger, MD;  [facility];  Service: Gastroenterology    PR COLSC FLX W/RMVL OF TUMOR POLYP LESION SNARE TQ N/A 09/12/2021    Procedure: COLONOSCOPY FLEX; W/REMOV TUMOR/LES BY SNARE;  Surgeon: Maris Berger, MD;  Location: [facility];  Service: Gastroenterology    PR CREAT AV FISTULA,AUTOGENOUS GRAFT Left 01/29/2020    Procedure: Create Av Fistula (Separt Proc); Autog Gft;  Surgeon: Leona Carry, MD;  Location: MAIN OR [facility];  Service: Transplant    PR CREAT AV FISTULA,NON-AUTOGENOUS GRAFT Left 06/08/2020    Procedure: CREATE AV FISTULA (SEPARATE PROC); NONAUTOGENOUS GRAFT (EG, BIOLOGICAL COLLAGEN, THERMOPLASTIC GRAFT);  Surgeon: Leona Carry, MD;  Location: MAIN OR [facility];  Service: Transplant    PR EXPLORATION N/FLWD SURG UPPER EXTREMITY ARTERY Left 08/02/2020    Procedure: Exploration Not Followed By Surgical Repair, Artery; Upper Extremity (Eg, Axillary, Brachial, Radial, Ulnar);  Surgeon: Earney Mallet, MD;  Location: MAIN OR [facility];  Service: Vascular    PR INCIS/DRAIN ARM,DEEP ABSC/HEMATOMA Left 08/14/2020    Procedure: INCISION AND DRAINAGE, UPPER ARM OR ELBOW AREA; DEEP ABSCESS OR HEMATOMA;  Surgeon: Earney Mallet, MD;  Location: MAIN OR [place];  Service: Vascular    PR INSERTION TUNNEL INTRAPERITONEAL CATH DIAL OPEN Midline 02/01/2021    Procedure: INSERTION OF INTRAPERITONEAL CANNULA OR CATHETER FOR DRAINAGE OR DIALYSIS; PERMANENT;  Surgeon: Loney Hering, MD;  Location: MAIN OR [place];  Service: Transplant    PR REMV ART CLOT AXILL-BRACH,ARM INCIS Left 08/02/2020    Procedure: Embolect/Thrombec; Axilry/Brachial Art-Arm Darnelle Maffucci;  Surgeon: Gwynneth Albright  Coralee Rud, MD;  Location: MAIN OR [facility];  Service: Vascular    PR UPPER GI ENDOSCOPY,BIOPSY N/A 08/29/2017    Procedure: UGI ENDOSCOPY; WITH BIOPSY, SINGLE OR MULTIPLE;  Surgeon: Neysa Hotter, MD;  Location: GI PROCEDURES [place] [facility];  Service: Gastroenterology    PR VEIN BYPASS GRAFT,SUBCL-BRACHIAL Left 08/05/2020    Procedure: Bypass Graft, With Vein; Subclavian-Brachial;  Surgeon: Earney Mallet, MD;  Location: MAIN OR [facility];  Service: Vascular    SALPINGECTOMY Left 04/12/2000    Ectopic pregnancy    SALPINGECTOMY Right 11/22/2015    at time of total vaginal hysterectomy    TONSILECTOMY, ADENOIDECTOMY, BILATERAL MYRINGOTOMY AND TUBES      TOTAL VAGINAL HYSTERECTOMY  11/22/2015    With R salpingectomy       MEDICATIONS:     Current Facility-Administered Medications:     cefepime (MAXIPIME) 1 g in sodium chloride 0.9 % (NS) 100 mL IVPB-MBP, 1 g, Intravenous, Once, Lum Babe, Charlott Rakes, MD    vancomycin (VANCOCIN) 1500 mg in sodium chloride (NS) 0.9 % 500 mL IVPB (premix), 1,500 mg, Intravenous, Once, Greggory Keen, MD    Current Outpatient Medications:     albuterol HFA 90 mcg/actuation inhaler, Inhale 2 puffs every six (6) hours as needed for wheezing., Disp: 8 g, Rfl: 3    allopurinoL (ZYLOPRIM) 100 MG tablet, Take 1 tablet (100 mg total) by mouth Every Tuesday, Thursday and Saturday., Disp: 36 tablet, Rfl: 3    amlodipine (NORVASC) 10 MG tablet, Take 1 tablet (10 mg total) by mouth daily., Disp: 90 tablet, Rfl: 3    apixaban (ELIQUIS) 5 mg Tab, Take 1 tablet (5 mg total) by mouth in the morning., Disp: 90 tablet, Rfl: 3    aspirin 81 MG chewable tablet, Chew 1 tablet (81 mg total) daily., Disp: 30 tablet, Rfl: 1    atorvastatin (LIPITOR) 80 MG tablet, Take 1 tablet (80 mg total) by mouth daily., Disp: 30 tablet, Rfl: 0    beclomethasone dipropionate (QVAR REDIHALER) 80 mcg/actuation inhaler, Inhale 1 puff  in the morning and 1 puff in the evening., Disp: 10.6 g, Rfl: 3    calcitriol (ROCALTROL) 0.5 MCG capsule, Take 2 capsules (1 mcg total) by mouth daily., Disp: 180 capsule, Rfl: 3    evolocumab (REPATHA SURECLICK) 140 mg/mL PnIj, Inject the contents of 1 pen (140 mg) under the skin every fourteen (14) days., Disp: 6 mL, Rfl: 3    fluocinolone 0.01 % external oil, Apply topically daily as needed. Use for psoriasis and scalp scaling., Disp: 120 mL, Rfl: 5    furosemide (LASIX) 40 MG tablet, Take 1 tablet (40 mg total) by mouth 3 (three) times a week. To be taken once a day if you develop leg swelling., Disp: 36 tablet, Rfl: 3    isosorbide mononitrate (IMDUR) 120 MG 24 hr tablet, Take 1 tablet (120 mg total) by mouth daily., Disp: 30 tablet, Rfl: 0    naltrexone 4.5 mg capsule, Take 1 capsule by mouth nightly., Disp: 30 capsule, Rfl: 2    NON FORMULARY, Low-dose Naltrexone 1.5 mg. Take 1 capsule PO nightly., Disp: 90 each, Rfl: 0    ondansetron (ZOFRAN) 4 MG tablet, Take 1 tablet (4 mg total) by mouth every six (6) hours for 3 days., Disp: 12 tablet, Rfl: 0    pregabalin (LYRICA) 50 MG capsule, Take 1 capsule (50 mg total) by mouth Three (3) times a day., Disp: 90 capsule, Rfl: 1    secukinumab (COSENTYX PEN, 2  PENS,) 150 mg/mL PnIj injection, Inject the contents of 2 pens (300 mg total) under the skin every twenty-eight (28) days. Maintenance dose., Disp: 6 mL, Rfl: 3    sevelamer (RENVELA) 800 mg tablet, Take 1 tablet (800 mg total) by mouth Three (3) times a day with a meal., Disp: 90 tablet, Rfl: 5    tizanidine (ZANAFLEX) 2 MG tablet, Take 1 tablet (2 mg total) by mouth daily as needed., Disp: 90 tablet, Rfl: 0    triamcinolone (KENALOG) 0.1 % ointment, Apply topically two (2) times a day as needed. Use on psoriasis until flat., Disp: 80 g, Rfl: 1    ALLERGIES:   Cephalexin, Mango, Peach (prunus persica), Wellbutrin [bupropion hcl], Egg, Fish containing products, Lactase-rennet, Lexiscan [regadenoson], Mushroom, and Tomato (solanum lycopersicum)    SOCIAL HISTORY:   Social History     Tobacco Use    Smoking status: Former     Current packs/day: 0.00     Average packs/day: 2.0 packs/day for 1 year (2.0 ttl pk-yrs)     Types: Cigarettes     Start date: 06/30/2009     Quit date: 06/30/2010     Years since quitting: 12.9    Smokeless tobacco: Never   Substance Use Topics    Alcohol use: Not Currently       FAMILY HISTORY:  Family History   Adopted: Yes   Problem Relation Age of Onset    Heart attack Mother 10    Alcohol abuse Mother     Mental illness Mother     No Known Problems Father     No Known Problems Sister     No Known Problems Daughter     No Known Problems Maternal Grandmother     No Known Problems Maternal Grandfather     No Known Problems Paternal Grandmother     No Known Problems Paternal Grandfather     No Known Problems Brother     No Known Problems Son     No Known Problems Maternal Aunt     No Known Problems Maternal Uncle     No Known Problems Paternal Aunt     No Known Problems Paternal Uncle     No Known Problems Other     Anesthesia problems Neg Hx     Broken bones Neg Hx     Cancer Neg Hx     Clotting disorder Neg Hx     Collagen disease Neg Hx     Diabetes Neg Hx     Dislocations Neg Hx     Fibromyalgia Neg Hx     Gout Neg Hx     Hemophilia Neg Hx     Osteoporosis Neg Hx     Rheumatologic disease Neg Hx     Scoliosis Neg Hx     Severe sprains Neg Hx     Sickle cell anemia Neg Hx     Spinal Compression Fracture Neg Hx     Melanoma Neg Hx     Basal cell carcinoma Neg Hx     Squamous cell carcinoma Neg Hx     Breast cancer Neg Hx          Radiology     XR Chest Portable   Final Result      Blunting of the left costophrenic angle, suggestive of small left pleural effusion.      ED POCUS   Final Result           Laboratory Data  Lab Results   Component Value Date    WBC 17.0 (H) 06/03/2023    HGB 10.8 (L) 06/03/2023    HCT 31.7 (L) 06/03/2023    PLT >437 06/03/2023       Lab Results   Component Value Date    NA 136 06/03/2023    K  06/03/2023      Comment:      Specimen hemolyzed.    CL 98 06/03/2023    CO2 20.0 06/03/2023    BUN 49 (H) 06/03/2023    CREATININE 12.64 (H) 06/03/2023    GLU 111 06/03/2023    CALCIUM 7.3 (L) 06/03/2023    MG 2.6 09/19/2022    PHOS 4.2 09/19/2022       Lab Results   Component Value Date    BILITOT 0.5 06/03/2023    BILIDIR 0.30 08/11/2020    PROT 7.8 06/03/2023    ALBUMIN 3.7 06/03/2023    ALT  06/03/2023      Comment:      Specimen hemolyzed.    AST  06/03/2023      Comment:      Specimen hemolyzed.    ALKPHOS 112 06/03/2023       Lab Results   Component Value Date    INR 1.02 09/15/2022    APTT 30.8 03/23/2022       Portions of this record have been created using [employer]. Dictation errors have been sought, but may not have been identified and corrected.         Lazarus Salines, MD  Resident  06/03/23 [phone]

## 2023-06-04 NOTE — Unmapped (Signed)
Nephrology ([facility]) History & Physical    Assessment & Plan:   Megan Robertson is a 52 y.o. 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     Principal Problem:    Hypotension  Active Problems:    Leukocytosis    ESRD (end stage renal disease) on dialysis (CMS-HCC)    Nausea  Resolved Problems:    * No resolved hospital problems. *        Active Problems    Hypotension - Leukocytosis - Concern for sepsis  With acute onset of malaise in conjunction with hypotension and leukocytosis, etiology is most likely undifferentiated infection with potential hypovolemia.  Alternative etiologies of hypotension including PE, tamponade secondary to pericardial effusion seem less likely.  She is on chronic anticoagulation with Eliquis and denies missing doses.  Pericardial effusion has been present on previous echocardiograms with no obvious increase in size and she does not have evidence of right heart strain on POCUS not elevation in troponin. Cardiology fellow evaluated overnight with POCUS. In terms of infectious source, symptoms and exam are non-specific. Patient is relatively immunosuppressed on Cosentyx and with ESRD so differential remains broad. Peritoneal fluid studies are not consistent with catheter associated peritonitis. CXR demonstrates blunting of left costophrenic angle (likely pleural effusion) but no concern for consolidation and no overt pulmonary edema. Cultures pending.  - S/p 250 mL bolus, will consider careful additional fluid bolus overnight  - Continue cefepime and vancomycin started in ED  - Blood cultures x2 pending  - RPP  - Urinalysis and culture pending when able to provide sample  - Hold all antihypertensives (has not been taking outpatient) and oral Lasix in setting of hypotension and hypovolemia on exam  - Can consider VQ scan for PE evaluation    Pericardial effusion - Positional chest discomfort  Small/moderate pericardial effusion seen on POCUS in ED and again with cardiology fellow. Reviewing previous echos, there is chronicity tot he pericardial effusion and does not appear to be acutely larger than previous. Troponin and EKG reassuring against ACS, pericarditis.   - TTE to evaluate stability and RV function    Shortness of breath  Potentially related to her pericardial effusion, pleural effusion, potential infection or volume status. On room air with mild tachypnea on exam without focal findings.  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  Last Fill Concentration: 2.5% dextrose or 1.5% dextrose   Mid-day Exchange: Not applicable.  Mid-day Fill Concentration: not applicable   If Tidal Rx, % Tidal: Not applicable.      - Per previous evaluation from nephrology fellow in ED, holding PD tonight with consideration of restarting in coming days pending workup  - Does not currently have HD access if the need arises  - Follow up repeat labs (  potassium hemolyzed initially)    Chronic Problems  Hyperphosphatemia - Secondary hyperparathyroidism  - Continue calcitrol  - Daily phosphorous level, consider restarting home sevelamer when eating regular meals     HTN: holding amlodipine and Imdur in setting of hypotension  Gout: holding home allopurinol (patient states it was recently discontinued)  Asthma: continue maintenance ICS, PRN albuterol (could consider simplification to ICS/SABA combo as outpatient)  Chronic pain: No recent fills of low-dose naltrexone, so not continuing. Also has not recently been taking Lyrica.     The patient's presentation is complicated by the following clinically significant conditions requiring additional evaluation and treatment: - Chronic kidney disease POA requiring further investigation, treatment, or monitoring   - Hyponatremia POA requiring further investigation, treatment, or monitoring  - Dehydration POA requiring further investigation, treatment, or monitoring  - Volume depletion POA requiring further investigation, treatment, or monitoring  - Hypotension POA requiring further investigation, treatment, or monitoring         Issues Impacting Complexity of Management:    Medical Decision Making: Reviewed records from the following unique sources  outpatient notes, nephrology consult notes, ED notes, labs and imaging. Independently interpreted POCUS imaging notable for stable pericardial effusion and no overt RV dysfunction, CXR with left pleural effusion but no consolidation or edema. Discussed the patient's management and/or test interpretation with Cardiology as summarized within this note      Checklist:  Diet: Regular Diet  DVT PPx: [name]  Code Status: Full Code  Dispo: Patient appropriate for inpatient, stepdown due to hypotension    Team Contact Information:   Primary Team: Nephrology ([facility])  Primary Resident: Jamesetta So, MD  Resident's Pager: [phone] (Nephrology Senior Resident)    Chief Concern:   Hypotension      Subjective:   Megan Robertson is a 52 y.o. female with pertinent PMHx of ESRD from FSGS on PD, HTN, chronic pain, asthma, history of AVF clots presenting with worsening dyspnea, malaise, chest discomfort, chills and nausea, found to have hypotension, leukocytosis, most concerning for infection.          History obtained from the patient.       HPI:  Hena woke up this morning with worsening of her shortness of breath and chest discomfort. This was positional with orthopnea and improvement of shortness of breath and chest tightness with sitting forward. Worsening of chest discomfort with deep breathing. This has been going on for several days on and off but was also associated this morning with malaise, increased nausea and diarrhea.    She has limited oral intake through the day and feels generally weak. She feels that she is below her normal dry weight, though isn't completely sure of her dry weight. She reports that her outpatient physicians have recently stopped her antihypertensive medicines as she has had hypotension outside of the hospital.    In the ED, she was mildly hypotensive (MAPs in 60s-70s) without tachycardia, afebrile. Labs were significant for leukocytosis of 17 with neutrophilic predominance 12.3, anemia of 10.8, ESR of 97, no apparent electrolyte abnormalities (though potassium was needing to be redrawn), creatinine of 12.6 and BUN or 49.    POCUS in ED did reveal small pericardial effusion but without concern for tamponade. This was confirmed talking with the cardiology fellow overnight who personally performed a repeat POCUS. Both initial and repeat troponin were low and EKG had QT prolongation but no ischemic findings.     Pertinent Surgical Hx  PD cath  AVF formation and revascularization 2021  [facility] 2017    Pertinent Family Hx  Mother with MI    Pertinent Social Hx   Former smoker, no current alcohol use  Lives with husband in [place]    Allergies  Cephalexin, Mango, Peach (prunus persica), Wellbutrin [bupropion hcl], Egg, Fish containing products, Lactase-rennet, Lexiscan [regadenoson], Mushroom, and Tomato (solanum lycopersicum)    I reviewed the Medication List. There is Uncertainty about the medications the patient is currently taking. Unclear if taking allopurinol, naltrexone.   Prior to Admission medications Medication Dose, Route, Frequency   albuterol HFA 90 mcg/actuation inhaler 2 puffs, Inhalation, Every 6 hours PRN   allopurinoL (ZYLOPRIM) 100 MG tablet 100 mg, Oral, Tue-Thur-Sat   amlodipine (NORVASC) 10 MG tablet 10 mg, Oral, Daily (standard)   apixaban (ELIQUIS) 5 mg Tab 5 mg, Oral, Daily   aspirin 81 MG chewable tablet 81 mg, Oral, Daily (standard)   atorvastatin (LIPITOR) 80 MG tablet 80 mg, Oral, Daily (standard)   beclomethasone dipropionate (QVAR REDIHALER) 80 mcg/actuation inhaler 1 puff, Inhalation, 2 times daily (RT)   calcitriol (ROCALTROL) 0.5 MCG capsule 1 mcg, Oral, Daily (standard)   evolocumab (REPATHA SURECLICK) 140 mg/mL PnIj Inject the contents of 1 pen (140 mg) under the skin every fourteen (14) days.   fluocinolone 0.01 % external oil Topical, Daily PRN, Use for psoriasis and scalp scaling.   furosemide (LASIX) 40 MG tablet 40 mg, Oral, 3 times weekly, To be taken once a day if you develop leg swelling.   isosorbide mononitrate (IMDUR) 120 MG 24 hr tablet 120 mg, Oral, Daily (standard)   naltrexone 4.5 mg capsule 1 capsule, Oral, Nightly   NON FORMULARY Low-dose Naltrexone 1.5 mg. Take 1 capsule PO nightly.   ondansetron (ZOFRAN) 4 MG tablet 4 mg, Oral, Every 6 hours   pregabalin (LYRICA) 50 MG capsule 50 mg, Oral, 3 times a day (standard)   secukinumab (COSENTYX PEN, 2 PENS,) 150 mg/mL PnIj injection Inject the contents of 2 pens (300 mg total) under the skin every twenty-eight (28) days. Maintenance dose.   sevelamer (RENVELA) 800 mg tablet 800 mg, Oral, 3 times a day (with meals)   tizanidine (ZANAFLEX) 2 MG tablet 2 mg, Oral, Daily PRN   triamcinolone (KENALOG) 0.1 % ointment Topical, 2 times a day PRN, Use on psoriasis until flat.       Designated Healthcare Decision Maker:  Ms. Sprouse currently has decisional capacity for healthcare decision-making and is able to designate a surrogate healthcare decision maker. Ms. Smetana designated healthcare decision maker(s) is/are Julann Pressman (the patient's spouse) as denoted by stated patient preference.    Objective:   Physical Exam:  Temp:  [36.5 ??C (97.7 ??F)-36.8 ??C (98.2 ??F)] 36.8 ??C (98.2 ??F)  Heart Rate:  [79-90] 90  SpO2 Pulse:  [87-98] 98  Resp:  [20-45] 20  BP: (86-113)/(51-64) 87/55  SpO2:  [99 %-100 %] 99 %    Gen: NAD, converses   Eyes: Sclera anicteric, EOMI grossly normal   HENT: Atraumatic, normocephalic  Neck: Trachea midline  Heart: RRR, no murmurs noted; POCUS with pericardial effusion, preserved LVEF, no obvious RV strain  Lungs: shallow breathing with reduced airflow to lung bases, no wheezes or crackles  Abdomen: Soft, NTND, PD cath site without erythema  Extremities: No edema  Neuro: Grossly symmetric, non-focal    Skin:  No rashes, lesions on clothed exam  Psych: Alert, oriented

## 2023-06-04 NOTE — Unmapped (Signed)
Nephrology    Ms. Swerdlow has developed persistent hypotension despite discontinuation of all antihypertensives and now has dyspnea. Her dialysis adequacy on PD has been declining. Differential diagnosis includes ADHF, blood loss, uremic pericarditis with tamponade, PE, infection, or a primary cardiac etiology. Suggest echocardiogram, ECG, cultures of PD fluid and cell count, and CMP, CBC with diff, CXR to start. Nephrology Consult team aware and will provide full consultation while hospitalized.     Zetta Bills. Stefano Gaul, MD  Date of service 06/03/23  .

## 2023-06-04 NOTE — Unmapped (Signed)
Pt BIB [facility] from home for c/o generalized weakness, CP, and SOB beginning this afternoon. EMS gave 1NTG and 324mg  ASA en route. Hx anxiety. Tachypneic however hx of anxiety.

## 2023-06-04 NOTE — Unmapped (Signed)
Dialysate FLCX & BFCC drawn & send to lab

## 2023-06-04 NOTE — Unmapped (Signed)
Vancomycin Therapeutic Monitoring Pharmacy Note    Megan Robertson is a 52 y.o. female starting vancomycin. Date of therapy initiation: 06/04/23     Indication: suspected infection, unknown source    Prior Dosing Information: None/new initiation     Goals:  Therapeutic Drug Levels  Vancomycin trough goal: re-dose when level </= 15    Additional Clinical Monitoring/Outcomes  Renal function, volume status (intake and output)    Results: Not applicable    Wt Readings from Last 1 Encounters:   06/03/23 79 kg (174 lb 2.6 oz)     Creatinine   Date Value Ref Range Status   06/03/2023 12.64 (H) 0.55 - 1.02 mg/dL Final   16/07/9603 54.09 (H) 0.55 - 1.02 mg/dL Final   81/19/1478 29.56 (H) 0.55 - 1.02 mg/dL Final        Pharmacokinetic Considerations and Significant Drug Interactions:  Peritoneal Dialysis - currently not running due to persistent hypotension  Concurrent nephrotoxic meds:  asa 81 mg daily    Assessment/Plan:  Recommendation(s)  Peritoneal Dialysis (currently not running due to hypotension)  Dosing per periodic, random levels ... re-dose when levels </= 15   Vancomycin 1500 mg IV given 08/13 at 0306 ... check level approx 24 hrs later   Estimated trough on recommended regimen: Not applicable - dosing by level    Follow-up  Level due:  08/14 with am labs  A pharmacist will continue to monitor and order levels as appropriate    Please page service pharmacist with questions/clarifications.    Megan Robertson A. Lovette Cliche, PharmD, BCPS

## 2023-06-05 LAB — PHOSPHORUS: PHOSPHORUS: 8.9 mg/dL — ABNORMAL HIGH (ref 2.4–5.1)

## 2023-06-05 LAB — BASIC METABOLIC PANEL
ANION GAP: 15 mmol/L — ABNORMAL HIGH (ref 5–14)
BLOOD UREA NITROGEN: 54 mg/dL — ABNORMAL HIGH (ref 9–23)
BUN / CREAT RATIO: 4
CALCIUM: 7.5 mg/dL — ABNORMAL LOW (ref 8.7–10.4)
CHLORIDE: 104 mmol/L (ref 98–107)
CO2: 22 mmol/L (ref 20.0–31.0)
CREATININE: 12.74 mg/dL — ABNORMAL HIGH
EGFR CKD-EPI (2021) FEMALE: 3 mL/min/{1.73_m2} — ABNORMAL LOW (ref >=60)
GLUCOSE RANDOM: 92 mg/dL (ref 70–179)
POTASSIUM: 3 mmol/L — ABNORMAL LOW (ref 3.4–4.8)
SODIUM: 141 mmol/L (ref 135–145)

## 2023-06-05 LAB — CBC
HEMATOCRIT: 30.3 % — ABNORMAL LOW (ref 34.0–44.0)
HEMOGLOBIN: 9.9 g/dL — ABNORMAL LOW (ref 11.3–14.9)
MEAN CORPUSCULAR HEMOGLOBIN CONC: 32.6 g/dL (ref 32.0–36.0)
MEAN CORPUSCULAR HEMOGLOBIN: 29.4 pg (ref 25.9–32.4)
MEAN CORPUSCULAR VOLUME: 90.1 fL (ref 77.6–95.7)
MEAN PLATELET VOLUME: 7.2 fL (ref 6.8–10.7)
PLATELET COUNT: 315 10*9/L (ref 150–450)
RED BLOOD CELL COUNT: 3.37 10*12/L — ABNORMAL LOW (ref 3.95–5.13)
RED CELL DISTRIBUTION WIDTH: 13.6 % (ref 12.2–15.2)
WBC ADJUSTED: 10.9 10*9/L (ref 3.6–11.2)

## 2023-06-05 LAB — VANCOMYCIN, RANDOM: VANCOMYCIN RANDOM: 16.5 ug/mL

## 2023-06-05 LAB — MAGNESIUM: MAGNESIUM: 1.6 mg/dL (ref 1.6–2.6)

## 2023-06-05 MED ADMIN — potassium chloride ER tablet 40 mEq: 40 meq | ORAL | @ 14:00:00 | Stop: 2023-06-05

## 2023-06-05 MED ADMIN — dianeal low CA - dextrose 1.5% and calcium 2.5 5,000 mL infusion: INTRAPERITONEAL

## 2023-06-05 MED ADMIN — apixaban (ELIQUIS) tablet 5 mg: 5 mg | ORAL | @ 13:00:00

## 2023-06-05 MED ADMIN — ondansetron (ZOFRAN) tablet 4 mg: 4 mg | ORAL | @ 18:00:00

## 2023-06-05 MED ADMIN — fluticasone furoate (ARNUITY ELLIPTA) 100 mcg/actuation inhaler 1 puff: 1 | RESPIRATORY_TRACT | @ 13:00:00

## 2023-06-05 MED ADMIN — sevelamer (RENVELA) tablet 800 mg: 800 mg | ORAL | @ 13:00:00 | Stop: 2023-06-05

## 2023-06-05 MED ADMIN — lactated ringers bolus 250 mL: 250 mL | INTRAVENOUS | @ 13:00:00 | Stop: 2023-06-05

## 2023-06-05 MED ADMIN — sevelamer (RENVELA) tablet 1,600 mg: 1600 mg | ORAL | @ 16:00:00

## 2023-06-05 MED ADMIN — diphenhydrAMINE (BENADRYL) capsule/tablet 25 mg: 25 mg | ORAL | @ 16:00:00 | Stop: 2023-06-05

## 2023-06-05 MED ADMIN — calcitriol (ROCALTROL) capsule 0.5 mcg: .5 ug | ORAL | @ 13:00:00

## 2023-06-05 MED ADMIN — cefTAZidime (FORTAZ) 1 g in sodium chloride 0.9 % (NS) 100 mL IVPB-MBP: 1 g | INTRAVENOUS | @ 10:00:00 | Stop: 2023-06-05

## 2023-06-05 MED ADMIN — atorvastatin (LIPITOR) tablet 80 mg: 80 mg | ORAL | @ 13:00:00

## 2023-06-05 MED ADMIN — aspirin chewable tablet 81 mg: 81 mg | ORAL | @ 13:00:00

## 2023-06-05 MED ADMIN — sevelamer (RENVELA) tablet 1,600 mg: 1600 mg | ORAL | @ 22:00:00

## 2023-06-05 NOTE — Unmapped (Signed)
Pt is alert and oriented x4.vital signs stable. Pt is receiving the PD this time.pt had voided once. No loose stools so far.will continue to monitor the pt.     Problem: Adult Inpatient Plan of Care  Goal: Plan of Care Review  Outcome: Progressing  Goal: Patient-Specific Goal (Individualized)  Outcome: Progressing  Goal: Absence of Hospital-Acquired Illness or Injury  Outcome: Progressing  Goal: Optimal Comfort and Wellbeing  Outcome: Progressing  Goal: Readiness for Transition of Care  Outcome: Progressing  Goal: Rounds/Family Conference  Outcome: Progressing     Problem: Infection  Goal: Absence of Infection Signs and Symptoms  Outcome: Progressing     Problem: Wound  Goal: Optimal Coping  Outcome: Progressing  Goal: Optimal Functional Ability  Outcome: Progressing  Goal: Absence of Infection Signs and Symptoms  Outcome: Progressing  Goal: Improved Oral Intake  Outcome: Progressing  Goal: Optimal Pain Control and Function  Outcome: Progressing  Goal: Skin Health and Integrity  Outcome: Progressing  Goal: Optimal Wound Healing  Outcome: Progressing     Problem: Self-Care Deficit  Goal: Improved Ability to Complete Activities of Daily Living  Outcome: Progressing     Problem: Fatigue  Goal: Improved Activity Tolerance  Outcome: Progressing

## 2023-06-05 NOTE — Unmapped (Signed)
Vancomycin Therapeutic Monitoring Pharmacy Note    Megan Robertson is a 52 y.o. female continuing vancomycin. Date of therapy initiation: 06/04/23    Indication: Bacteremia/Sepsis and Suspected infection     Prior Dosing Information: Previous regimen 1500 mg x1 on 8/13 ~0300      Goals:  Therapeutic Drug Levels  Vancomycin trough goal: 10-15 mg/L    Additional Clinical Monitoring/Outcomes  Renal function, volume status (intake and output)    Results: Vancomycin level 16.5 mg/L, drawn appropriately ~28 hours after dose    Wt Readings from Last 1 Encounters:   06/04/23 79.9 kg (176 lb 1.6 oz)     Creatinine   Date Value Ref Range Status   06/05/2023 12.74 (H) 0.55 - 1.02 mg/dL Final   56/43/3295 18.84 (H) 0.55 - 1.02 mg/dL Final   16/60/6301 60.10 (H) 0.55 - 1.02 mg/dL Final        Pharmacokinetic Considerations and Significant Drug Interactions:  Per linear dose adjustment, patient is on PD  Concurrent nephrotoxic meds: not applicable    Assessment/Plan:  Recommendation(s)  Give a 1x dose of vanc 1000 mg at 1200 today  Estimated trough on recommended regimen: Not applicable - dosing by level    Follow-up  Level due: tomorrow with AM labs  A pharmacist will continue to monitor and order levels as appropriate    Please page service pharmacist with questions/clarifications.    [place] M Ilijah Doucet, PharmD

## 2023-06-05 NOTE — Unmapped (Signed)
ADULT PERITONEAL DIALYSIS TREATMENT NOTE    PROCEDURE DATE/TIME:    06/04/23 10:19 PM PD THERAPY DAY:  1 PD DEVICE:   (16109)     THERAPY TYPE:  Continuous Cycling Peritoneal Dialysis - Standard     CONSENT:    Written consent was obtained prior to the procedure and is detailed in the medical record. Prior to the start of the procedure, a time out was taken and the identity of the patient was confirmed via name, medical record number and date of birth.    Active Dialysis Orders (168h ago, onward)       Start     Ordered    06/04/23 1524  Peritoneal Dialysis - CCPD Standard  Daily      Question Answer Comment   Therapy Time (hours) Other (please specify) 11   Total Number of Cycles 4    Exchange Volume (L) 2L    Day dwell/Last fill (mL) 1000    Dialysate last fill with bag as ordered        06/04/23 1523                    VITAL SIGNS:  Vitals:    06/04/23 1952   BP: 102/60   Pulse: 93   Resp: 18   Temp: 36.8 ??C (98.2 ??F)   SpO2: 100%    Vitals:    06/03/23 2037 06/04/23 0904   Weight: 79 kg (174 lb 2.6 oz) 79.9 kg (176 lb 1.6 oz)        LAB RESULTS:    Potassium   Date Value Ref Range Status   06/04/2023 2.7 (L) 3.4 - 4.8 mmol/L Final       DIALYSATE FLUID:  Dianeal Solution: Dextrose 1.5% Calcium 2.5 mEq/L   Additives:  None    ACCESS:  Peritoneal Dialysis Catheter Continuous cycling Left lower abdomen (Active)       Peritoneal Dialysis Catheter Left lower abdomen (Active)   Site Assessment Clean;Dry;Intact 06/04/23 2058   Dressing Occlusive 06/04/23 2058   Dressing Status      Intact/not removed;Clean;Dry 06/04/23 2058   Dressing Change Due 06/11/23 06/04/23 2058   Dressing Intervention No intervention needed 06/04/23 2058   Status Dwelling 06/04/23 2058   PD Catheter Transfer Set [employer] Connector 06/04/23 2058     Patient Lines/Drains/Airways Status       Active Peripheral & Central Intravenous Access       Name Placement date Placement time Site Days    Peripheral IV 06/03/23 Anterior;Right Forearm 06/03/23 2105  Forearm  1    Peripheral IV 06/04/23 Anterior;Right;Upper Arm 06/04/23  0250  Arm  less than 1                    SETTINGS:  Mode: Standard Minimum Drain Volume (%): 85%   Smart Dwells: Yes Heater Bag Empty: No   Tidal Full Drains: No Flush: Yes   Program Locked: No I-Drain Alarm (mL): 0 mL   Program Verfied: Yes     Lines Unclamped:  Yes.      INITIAL DRAIN:    Inital Outflow Effluent Appearance: Clear, Light, Yellow Initial Drain Volume (mL): 1068 mL     THERAPY DETAILS:  Peritoneal Dialysis Fill Volume (ml): 2000 ml Peritoneal Dialysis Total Volume (ml): 9000 ml           EXCHANGE NET BALANCE:              DIALYSIS ON-CALL  NURSE PAGER NUMBER:  Monday thru Friday 0700 - 1730: Call the Dialysis Unit ext. [phone]   After 1730 and all day Sunday: Call the Dialysis RN Pager Number [phone]     PROCEDURE REVIEW, VERIFICATION, HANDOFF:  PD settings verified, procedure reviewed, and instructions given to primary RN.  Dialysis RN Verifying: Manley Mason Primary PD RN Verifying: [facility]

## 2023-06-05 NOTE — Unmapped (Signed)
Pt admitted. Pt A&O [forgetful at times], VSS, no complaint of pain, calm and cooperative. Scheduled PO K and mad replacement given. Echo done at bedside. PD site clean and intact. Pt had 1 BM but urine and stool mixed so unable to collect samples. Pt ambulated in room. Bed in low and locked position. Family visited. Enteric precaution initiated and maintained.           Problem: Adult Inpatient Plan of Care  Goal: Plan of Care Review  Outcome: Ongoing - Unchanged  Goal: Patient-Specific Goal (Individualized)  Outcome: Ongoing - Unchanged  Goal: Absence of Hospital-Acquired Illness or Injury  Outcome: Ongoing - Unchanged  Intervention: Identify and Manage Fall Risk  Recent Flowsheet Documentation  Taken 06/04/2023 0855 by Freda Jackson, RN  Safety Interventions:   fall reduction program maintained   low bed   nonskid shoes/slippers when out of bed   no IV/BP/blood draw left arm  Intervention: Prevent and Manage VTE (Venous Thromboembolism) Risk  Recent Flowsheet Documentation  Taken 06/04/2023 0855 by Freda Jackson, RN  VTE Prevention/Management:   ambulation promoted   anticoagulant therapy  Goal: Optimal Comfort and Wellbeing  Outcome: Ongoing - Unchanged  Goal: Readiness for Transition of Care  Outcome: Ongoing - Unchanged  Goal: Rounds/Family Conference  Outcome: Ongoing - Unchanged     Problem: Infection  Goal: Absence of Infection Signs and Symptoms  Outcome: Ongoing - Unchanged     Problem: Wound  Goal: Optimal Coping  Outcome: Ongoing - Unchanged  Goal: Optimal Functional Ability  Outcome: Ongoing - Unchanged  Goal: Absence of Infection Signs and Symptoms  Outcome: Ongoing - Unchanged  Goal: Improved Oral Intake  Outcome: Ongoing - Unchanged  Goal: Optimal Pain Control and Function  Outcome: Ongoing - Unchanged  Goal: Skin Health and Integrity  Outcome: Ongoing - Unchanged  Intervention: Optimize Skin Protection  Recent Flowsheet Documentation  Taken 06/04/2023 0855 by [place], [place] A, RN  Head of Bed [facility]) Positioning: HOB elevated  Goal: Optimal Wound Healing  Outcome: Ongoing - Unchanged     Problem: Self-Care Deficit  Goal: Improved Ability to Complete Activities of Daily Living  Outcome: Ongoing - Unchanged     Problem: Fatigue  Goal: Improved Activity Tolerance  Outcome: Ongoing - Unchanged     Problem: Fall Injury Risk  Goal: Absence of Fall and Fall-Related Injury  Outcome: Ongoing - Unchanged  Intervention: Promote Injury-Free Environment  Recent Flowsheet Documentation  Taken 06/04/2023 0855 by [place], [place], RN  Safety Interventions:   fall reduction program maintained   low bed   nonskid shoes/slippers when out of bed   no IV/BP/blood draw left arm

## 2023-06-05 NOTE — Unmapped (Signed)
Nephrology ([facility]) Progress Note    Assessment & Plan:   Megan Robertson is a 52 y.o. 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     Principal Problem:    Hypotension  Active Problems:    Leukocytosis    ESRD (end stage renal disease) on dialysis (CMS-HCC)    Nausea    QT prolongation    Active Problems    Diarrhea  Hypotension - improving  Leukocytosis  Reported a few days of diarrhea, poor PO intake prior to admission. Questionable PD output. Suspect hypotension on presentation due to overall hypovolemia, repleted with NS 250 mL bolus x2 in ED. Initial concern for pericardial effusion on presentation, only trivial effusion on formal TTE. Cannot r/o PE given hypotension, has been on [facility] with Eliquis daily. Infectious etiology at this time unlikely given PD fluid having low cell count, NGTD on cx, blood cx NGTD x24 hours, RPP negative. BP remarkable at 85/47 on 8/14, given LR 250 mL bolus with improvement to 101/51.  - Follow-up Urine cx, C. diff, GIPP  - Continue volume resuscitation as necessary  - Obtain orthostatics  - Previously on broad spectrum with Vancomycin/Ceftazidime, will stop given low suspicion of infectious etiology    Shortness of Breath  Patient breathing comfortably on room air. Potentially could be secondary to left pleural effusion. As above, PE on ddx given only taking eliquis once daily.   - continuous O2 sat    ESRD 2/2 FSGS on PD  As above, seems to have been continuing with aggressive fluid removal at home despite poor PO intake and diarrhea, likely contributing to her hypotension on presentation.   - PD daily    Chronic Conditions  Hyperphosphatemia - Secondary hyperparathyroidism  - Continue calcitrol  - Daily phosphorous level  - Resume sevelamer 1600 mg 3 times daily     HTN: holding amlodipine and Imdur in setting of hypotension  Gout: holding home allopurinol (patient states it was recently discontinued)  Asthma: continue maintenance ICS, PRN albuterol (could consider simplification to ICS/SABA combo as outpatient)  Chronic pain: No recent fills of low-dose naltrexone, so not continuing. Also has not recently been taking Lyrica    Daily Checklist:  Diet: Regular Diet  DVT PPx: [name]  Electrolytes: Replete Potassium to >/= 3.6 and Magnesium to >/= 1.8  Code Status: Full Code  Dispo: Continue floor care     Team Contact Information:   Primary Team: Nephrology (MEDB)  Primary Resident: Bard Herbert, MD  Resident's Pager: [phone] (Nephrology Intern - Tower)    Interval History:   No acute overnight events reported by nursing staff.  No reports of loose stools.  Of note, patient noted to be hypotensive to 85/47 this morning.  She was repleted with LR to 50 mL bolus x 1 with improvement to normotensive BPs throughout today.  Patient noted to have improved PO intake and had good appetite, consuming breakfast this a.m.    Objective:   Temp:  [35.3 ??C (95.5 ??F)-37 ??C (98.6 ??F)] 36.7 ??C (98.1 ??F)  Heart Rate:  [84-93] 88  Resp:  [18-20] 18  BP: (85-127)/(47-63) 101/51  SpO2:  [99 %-100 %] 100 %      Intake/Output Summary (  Last 24 hours) at 06/05/2023 2225  Last data filed at 06/05/2023 1215  Gross per 24 hour   Intake 540 ml   Output 654 ml   Net -114 ml       Gen: NAD, converses   HENT: atraumatic, normocephalic  Heart: RRR  Lungs: CTAB, comfortable work of breathing  Abdomen: soft, NTND, no guarding  Extremities: No edema  Neuro: oriented    Bard Herbert, MD  Internal Medicine, PGY-1    Portions of this record have been created using Dragon dictation software. Dictation errors have been sought, but may not have been identified and corrected.

## 2023-06-05 NOTE — Unmapped (Signed)
Adult Nutrition Assessment Note    Visit Type: RN Consult  Reason for Visit: Per Admission Nutrition Screen (Adult)    HPI & PMH:  Megan Robertson is a 52 y.o. 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     Anthropometric Data:  Height: 157.5 cm (5' 2)   Admission weight: 79 kg (174 lb 2.6 oz)  Last recorded weight: 79.9 kg (176 lb 1.6 oz)  IBW: 49.97 kg  Percent IBW: 159.85 %  BMI: Body mass index is 32.21 kg/m??.   Usual Body Weight:  dry weight of 83.x lb per patient report    Weight history prior to admission: Patient reports having fluid-related weight fluctuations. -9 lbs (~5%) between 01/15/23-06/03/23 (4 months, 17 days) per documentation. This weight loss may include fluid as patient endorses taking lasix at home.   Wt Readings from Last 10 Encounters:   06/04/23  06/03/23 79.9 kg (176 lb 1.6 oz)  79 kg (174 lb 2.6 oz)    01/15/23 83.3 kg (183 lb 9.6 oz)   12/19/22 83.5 kg (184 lb)   11/22/22 83.3 kg (183 lb 9.6 oz)   11/21/22 84 kg (185 lb 1.6 oz)   11/07/22 82.9 kg (182 lb 11.2 oz)   10/19/22 83.9 kg (185 lb)   09/17/22 83.4 kg (183 lb 13.8 oz)   09/06/22 82.2 kg (181 lb 5.3 oz)   08/22/22 85.7 kg (189 lb)        Weight changes this admission:   Last 5 Recorded Weights    06/03/23 2037 06/04/23 0904   Weight: 79 kg (174 lb 2.6 oz) 79.9 kg (176 lb 1.6 oz)        Nutrition Focused Physical Exam:  Unable to complete at this time due to primary team starting to round on patient      NUTRITIONALLY RELEVANT DATA     Medications:   Nutritionally pertinent medications reviewed and evaluated for potential food and/or medication interactions. Vancomycin, sevelamer, KCl, calcitriol, lasix(on hold)    Labs: Nutritionally pertinent labs reviewed and include K+: 3.0 mmol/L and Phosphorus: 8.9 mg/dL    Nutrition History:   June 05, 2023: Prior to admission: Patient reports eating less than usual (1 meal/day compared to her usual 2 meals/day) for 3 days PTA. Endorses decreased appetite, nausea, and diarrhea. Endorses swallowing difficulty and feeling like foods are getting stuck in her throat but swallowing okay when eating slowly. Does not endorse vomiting, abdominal pain, chewing difficulty. During admission: Patient endorses consuming 100% of breakfast (rice krispies cereal, whole milk, bacon, orange juice ordered) this morning. Patient noted to have consumed 100% of lunch (roasted [place] with gravy, mashed potatoes and poultry gravy, dinner roll, sweet tea ordered). Documented meal intakes: 75/100/100/100%. Patient does not endorse nausea or diarrhea. Patient states she has been swallowing without issue as she has been doing it slowly.     Allergies, Intolerances, Sensitivities, and/or Cultural/Religious Dietary Restrictions:  mango, peach, egg, fish containing products, mushroom, tomato    Current Nutrition:  Oral intake     Nutrition Orders            Nutrition Therapy Regular/House starting at 08/12 2234  Nutritional Needs:   Healthy balance of carbohydrate, protein, and fat.       Malnutrition assessment not yet completed at this time due to inability to complete nutrition focused physical exam (NFPE).    GOALS and EVALUATION     Patient to meet 75% or greater of nutritional needs via combination of meals, snacks, and/or oral supplements within admission.  - New    Motivation, Barriers, and Compliance:  Evaluation of motivation, barriers, and compliance completed. No concerns identified at this time.     NUTRITION ASSESSMENT     Patient's PO intake progressing toward meeting nutritional needs at this time.  Documented meal intakes: 75/100/100/100%.   Will continue to monitor patient's PO intake and assess need for oral nutrition supplement.   K (3.0), possibly affected by lasix use. K level trending up toward normal limits. KCl repletion given on 08/13-08/14.   Phos (8.9), trending down toward normal limits. Patient on phosphate binder. Patient may benefit from dietary phosphorus restriction if hyperphosphatemia persists.       Discharge Planning:   Monitor for potential discharge needs with multi-disciplinary team.     Was the nutrition care plan completed? No, unable to diagnose malnutrition at this time       NUTRITION INTERVENTIONS and RECOMMENDATION     Consider dietary phosphorus restriction if hyperphosphatemia persists.   Monitor PO intake and record % consumed in Epic.  Offer oral nutrition supplement as needed.   Monitor electrolytes, replete as indicated.   Continue phosphate binder with meals/snacks.  Regular weight checks.     Follow-Up Parameters:   1-2 times per week (and more frequent as indicated)    Tobie Lords, MPH, RD, LDN  Pager # [phone]

## 2023-06-05 NOTE — Unmapped (Signed)
Nephrology (MEDB) Progress Note    Assessment & Plan:   Megan Robertson is a 52 y.o. 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     Principal Problem:    Hypotension  Active Problems:    Leukocytosis    ESRD (end stage renal disease) on dialysis (CMS-HCC)    Nausea    QT prolongation    Active Problems    Diarrhea - Hypotension (resolved) - Leukocytosis  Reporting a few days of diarrhea, poor PO intake at home. Also may have been pulling off too much fluid with PD at home. Suspect hypotension on presentation due to overall hypovolemia, did receive gentle IVF overnight. Initial concern for pericardial effusion on presentation, only trivial effusion  on formal TTE. Also need to consider PE on the ddx for hypotension, has been taking eliquis once daily.  - F/up cultures  - PD culture  - C diff, GIPP  - Will empirically cover with ceftaz/vanc    Shortness of Breath  Now breathing comfortably on room air. Potentially could be secondary to left pleural effusion. As above, PE on ddx given only taking eliquis once daily.   - continuous O2 sat    ESRD 2/2 FSGS on PD  As above, seems to have been continuing with aggressive fluid removal at home despite poor PO intake and diarrhea, likely contributing to her hypotension on presentation.   - PD tonight    Chronic Conditions  Hyperphosphatemia - Secondary hyperparathyroidism  - Continue calcitrol  - Daily phosphorous level, consider restarting home sevelamer when eating regular meals      HTN: holding amlodipine and Imdur in setting of hypotension  Gout: holding home allopurinol (patient states it was recently discontinued)  Asthma: continue maintenance ICS, PRN albuterol (could consider simplification to ICS/SABA combo as outpatient)  Chronic pain: No recent fills of low-dose naltrexone, so not continuing. Also has not recently been taking Lyrica    Daily Checklist:  Diet: Regular Diet  DVT PPx: [name]  Electrolytes: No Repletion Needed  Code Status: Full Code  Dispo: Continue floor care     Team Contact Information:   Primary Team: Nephrology (MEDB)  Primary Resident: Achille Rich, MD, MD  Resident's Pager: [phone] (Nephrology Senior Resident)    Interval History:     Admitted overnight. On room air. Husband at bedside. Endorses a few days of diarrhea at home.     Objective:   Temp:  [35.3 ??C (95.5 ??F)-36.8 ??C (98.2 ??F)] 36.8 ??C (98.2 ??F)  Heart Rate:  [76-93] 93  SpO2 Pulse:  [79-98] 85  Resp:  [16-29] 18  BP: (87-127)/(31-63) 102/60  SpO2:  [97 %-100 %] 100 %    Gen: NAD, converses   HENT: atraumatic, normocephalic  Heart: distant heart sounds, auscultation limited by pt speaking  Lungs: CTAB, comfortable work of breathing  Abdomen: soft, NTND, no guarding  Extremities: No edema  Neuro: oriented

## 2023-06-06 LAB — CBC
HEMATOCRIT: 30.2 % — ABNORMAL LOW (ref 34.0–44.0)
HEMOGLOBIN: 9.6 g/dL — ABNORMAL LOW (ref 11.3–14.9)
MEAN CORPUSCULAR HEMOGLOBIN CONC: 31.6 g/dL — ABNORMAL LOW (ref 32.0–36.0)
MEAN CORPUSCULAR HEMOGLOBIN: 29.2 pg (ref 25.9–32.4)
MEAN CORPUSCULAR VOLUME: 92.1 fL (ref 77.6–95.7)
MEAN PLATELET VOLUME: 7.6 fL (ref 6.8–10.7)
PLATELET COUNT: 261 10*9/L (ref 150–450)
RED BLOOD CELL COUNT: 3.28 10*12/L — ABNORMAL LOW (ref 3.95–5.13)
RED CELL DISTRIBUTION WIDTH: 14 % (ref 12.2–15.2)
WBC ADJUSTED: 12.6 10*9/L — ABNORMAL HIGH (ref 3.6–11.2)

## 2023-06-06 LAB — BASIC METABOLIC PANEL
ANION GAP: 16 mmol/L — ABNORMAL HIGH (ref 5–14)
BLOOD UREA NITROGEN: 56 mg/dL — ABNORMAL HIGH (ref 9–23)
BUN / CREAT RATIO: 5
CALCIUM: 7.1 mg/dL — ABNORMAL LOW (ref 8.7–10.4)
CHLORIDE: 105 mmol/L (ref 98–107)
CO2: 21 mmol/L (ref 20.0–31.0)
CREATININE: 12.29 mg/dL — ABNORMAL HIGH
EGFR CKD-EPI (2021) FEMALE: 3 mL/min/{1.73_m2} — ABNORMAL LOW (ref >=60)
GLUCOSE RANDOM: 90 mg/dL (ref 70–179)
POTASSIUM: 3 mmol/L — ABNORMAL LOW (ref 3.4–4.8)
SODIUM: 142 mmol/L (ref 135–145)

## 2023-06-06 LAB — MAGNESIUM: MAGNESIUM: 1.4 mg/dL — ABNORMAL LOW (ref 1.6–2.6)

## 2023-06-06 LAB — CORTISOL: CORTISOL TOTAL: 12.7 ug/dL

## 2023-06-06 LAB — PHOSPHORUS: PHOSPHORUS: 8.1 mg/dL — ABNORMAL HIGH (ref 2.4–5.1)

## 2023-06-06 MED ADMIN — sevelamer (RENVELA) tablet 1,600 mg: 1600 mg | ORAL | @ 22:00:00

## 2023-06-06 MED ADMIN — dianeal low CA - dextrose 1.5% and calcium 2.5 5,000 mL infusion: INTRAPERITONEAL

## 2023-06-06 MED ADMIN — apixaban (ELIQUIS) tablet 5 mg: 5 mg | ORAL | @ 13:00:00

## 2023-06-06 MED ADMIN — sevelamer (RENVELA) tablet 1,600 mg: 1600 mg | ORAL | @ 17:00:00

## 2023-06-06 MED ADMIN — fluticasone furoate (ARNUITY ELLIPTA) 100 mcg/actuation inhaler 1 puff: 1 | RESPIRATORY_TRACT | @ 13:00:00

## 2023-06-06 MED ADMIN — calcitriol (ROCALTROL) capsule 0.5 mcg: .5 ug | ORAL | @ 13:00:00

## 2023-06-06 MED ADMIN — magnesium sulfate in D5W 1 gram/100 mL infusion 1 g: 1 g | INTRAVENOUS | @ 14:00:00 | Stop: 2023-06-06

## 2023-06-06 MED ADMIN — sevelamer (RENVELA) tablet 1,600 mg: 1600 mg | ORAL | @ 13:00:00

## 2023-06-06 MED ADMIN — atorvastatin (LIPITOR) tablet 80 mg: 80 mg | ORAL | @ 13:00:00

## 2023-06-06 MED ADMIN — potassium chloride ER tablet 40 mEq: 40 meq | ORAL | @ 13:00:00 | Stop: 2023-06-06

## 2023-06-06 MED ADMIN — aspirin chewable tablet 81 mg: 81 mg | ORAL | @ 13:00:00

## 2023-06-06 NOTE — Unmapped (Signed)
Patient is alert & oriented. Vtal signs stable. Due meds given.  PD completed. Enteric isolation precaution dc .Bed in low position with call bell within reach.      Problem: Adult Inpatient Plan of Care  Goal: Plan of Care Review  Outcome: Progressing  Goal: Patient-Specific Goal (Individualized)  Outcome: Progressing  Goal: Absence of Hospital-Acquired Illness or Injury  Outcome: Progressing  Intervention: Identify and Manage Fall Risk  Recent Flowsheet Documentation  Taken 06/06/2023 0800 by Lyndee Leo, RN  Safety Interventions:   fall reduction program maintained   lighting adjusted for tasks/safety   low bed  Goal: Optimal Comfort and Wellbeing  Outcome: Progressing  Goal: Readiness for Transition of Care  Outcome: Progressing  Goal: Rounds/Family Conference  Outcome: Progressing     Problem: Infection  Goal: Absence of Infection Signs and Symptoms  Outcome: Progressing  Intervention: Prevent or Manage Infection  Recent Flowsheet Documentation  Taken 06/06/2023 0800 by Lyndee Leo, RN  Isolation Precautions: enteric precautions maintained     Problem: Wound  Goal: Optimal Coping  Outcome: Progressing  Goal: Optimal Functional Ability  Outcome: Progressing  Goal: Absence of Infection Signs and Symptoms  Outcome: Progressing  Intervention: Prevent or Manage Infection  Recent Flowsheet Documentation  Taken 06/06/2023 0800 by Lyndee Leo, RN  Isolation Precautions: enteric precautions maintained  Goal: Improved Oral Intake  Outcome: Progressing  Goal: Optimal Pain Control and Function  Outcome: Progressing  Goal: Skin Health and Integrity  Outcome: Progressing  Goal: Optimal Wound Healing  Outcome: Progressing     Problem: Self-Care Deficit  Goal: Improved Ability to Complete Activities of Daily Living  Outcome: Progressing     Problem: Fatigue  Goal: Improved Activity Tolerance  Outcome: Progressing     Problem: Fall Injury Risk  Goal: Absence of Fall and Fall-Related Injury  Outcome: Progressing  Intervention: Promote Injury-Free Environment  Recent Flowsheet Documentation  Taken 06/06/2023 0800 by Lyndee Leo, RN  Safety Interventions:   fall reduction program maintained   lighting adjusted for tasks/safety   low bed     Problem: Skin Injury Risk Increased  Goal: Skin Health and Integrity  Outcome: Progressing

## 2023-06-06 NOTE — Unmapped (Signed)
ADULT PERITONEAL DIALYSIS TREATMENT NOTE    PROCEDURE DATE/TIME:    06/05/23 11:12 PM PD THERAPY DAY:  2 PD DEVICE:   (Jelly)     THERAPY TYPE:  Continuous Cycling Peritoneal Dialysis - Standard     CONSENT:    Written consent was obtained prior to the procedure and is detailed in the medical record. Prior to the start of the procedure, a time out was taken and the identity of the patient was confirmed via name, medical record number and date of birth.    Active Dialysis Orders (168h ago, onward)       Start     Ordered    06/05/23 1705  Peritoneal Dialysis - CCPD Standard  Daily      Question Answer Comment   Therapy Time (hours) Other (please specify) 18   Total Number of Cycles 6    Exchange Volume (L) 2L    Day dwell/Last fill (mL) 1000    Dialysate last fill with bag as ordered        06/05/23 1704                    VITAL SIGNS:  Vitals:    06/05/23 2129   BP: 120/61   Pulse: 98   Resp: 21   Temp: 37 ??C (98.6 ??F)   SpO2: 98%    Vitals:    06/03/23 2037 06/04/23 0904   Weight: 79 kg (174 lb 2.6 oz) 79.9 kg (176 lb 1.6 oz)        LAB RESULTS:    Potassium   Date Value Ref Range Status   06/05/2023 3.0 (L) 3.4 - 4.8 mmol/L Final       DIALYSATE FLUID:  Dianeal Solution: Dextrose 1.5% Calcium 2.5 mEq/L   Additives:  None    ACCESS:  Peritoneal Dialysis Catheter Continuous cycling Left lower abdomen (Active)       Peritoneal Dialysis Catheter Left lower abdomen (Active)   Site Assessment Clean;Dry;Intact 06/05/23 2300   Dressing Occlusive 06/05/23 2300   Dressing Status      Clean;Dry;Intact/not removed 06/05/23 2300   Dressing Change Due 06/11/23 06/05/23 2300   Dressing Intervention No intervention needed 06/05/23 2300   Status Accessed 06/05/23 2036   PD Catheter Transfer Set [employer] Connector 06/05/23 2036     Patient Lines/Drains/Airways Status       Active Peripheral & Central Intravenous Access       Name Placement date Placement time Site Days    Peripheral IV 06/03/23 Anterior;Right Forearm 06/03/23 2105  Forearm  2    Peripheral IV 06/04/23 Anterior;Right;Upper Arm 06/04/23  0250  Arm  1                    SETTINGS:  Mode: Standard Minimum Drain Volume (%): 85%   Smart Dwells: Yes Heater Bag Empty: No   Tidal Full Drains: No Flush: Yes   Program Locked: No I-Drain Alarm (mL): 0 mL   Program Verfied: Yes     Lines Unclamped:  Yes.      INITIAL DRAIN:    Inital Outflow Effluent Appearance: Clear, Yellow, Light Initial Drain Volume (mL): 1109 mL     THERAPY DETAILS:  Peritoneal Dialysis Fill Volume (ml): 2000 ml Peritoneal Dialysis Total Volume (ml): 13000 ml   Average Dwell Time (Minutes): 154 Minutes Effluent Appearance: Clear     EXCHANGE NET BALANCE:    PD Net Exchange Output (mL): 304 ml  DIALYSIS ON-CALL NURSE PAGER NUMBER:  Monday thru Friday 0700 - 1730: Call the Dialysis Unit ext. [phone]   After 1730 and all day Sunday: Call the Dialysis RN Pager Number [phone]     PROCEDURE REVIEW, VERIFICATION, HANDOFF:  PD settings verified, procedure reviewed, and instructions given to primary RN.  Dialysis RN Verifying: Burnetta Sabin, RN Primary RN Verifying: Evalee Mutton, RN

## 2023-06-06 NOTE — Unmapped (Signed)
Patient resting in bed.  No changes.  No N/v/d today or shortness of breath or increased pain.  Blood pressures soft this AM after PD finished.  250cc LR bolus given and blood pressures maintain>110SBP. Did give a dose of benedryl d/t itching.  Tolerated all 3 meals today. No issues. Will continue to monitor    Problem: Adult Inpatient Plan of Care  Goal: Plan of Care Review  Outcome: Progressing  Flowsheets (Taken 06/05/2023 1755)  Progress: improving  Plan of Care Reviewed With: patient  Goal: Patient-Specific Goal (Individualized)  Outcome: Progressing  Goal: Absence of Hospital-Acquired Illness or Injury  Outcome: Progressing  Intervention: Identify and Manage Fall Risk  Recent Flowsheet Documentation  Taken 06/05/2023 0758 by Michaelle Birks, RN  Safety Interventions:   environmental modification   elopement precautions   suicide precautions   sitter at bedside  Goal: Optimal Comfort and Wellbeing  Outcome: Progressing  Goal: Readiness for Transition of Care  Outcome: Progressing  Goal: Rounds/Family Conference  Outcome: Progressing     Problem: Infection  Goal: Absence of Infection Signs and Symptoms  Outcome: Progressing     Problem: Wound  Goal: Optimal Coping  Outcome: Progressing  Goal: Optimal Functional Ability  Outcome: Progressing  Goal: Absence of Infection Signs and Symptoms  Outcome: Progressing  Goal: Improved Oral Intake  Outcome: Progressing  Goal: Optimal Pain Control and Function  Outcome: Progressing  Goal: Skin Health and Integrity  Outcome: Progressing  Goal: Optimal Wound Healing  Outcome: Progressing     Problem: Self-Care Deficit  Goal: Improved Ability to Complete Activities of Daily Living  Outcome: Progressing     Problem: Fatigue  Goal: Improved Activity Tolerance  Outcome: Progressing     Problem: Fall Injury Risk  Goal: Absence of Fall and Fall-Related Injury  Outcome: Progressing  Intervention: Promote Injury-Free Environment  Recent Flowsheet Documentation  Taken 06/05/2023 0758 by Michaelle Birks, RN  Safety Interventions:   environmental modification   elopement precautions   suicide precautions   sitter at bedside     Problem: Skin Injury Risk Increased  Goal: Skin Health and Integrity  Outcome: Progressing

## 2023-06-06 NOTE — Unmapped (Signed)
Pt is alert and oriented x 4. Pt remains free from falls this shift. Bed lowered and locked with call bell in reach. Pt denies pain this shift. I and O's monitored. Enteric precautions maintained. Pt remains on PD throughout shift. Pt encouraged to use call bel this shift for assistance. No bowel movement.   Problem: Adult Inpatient Plan of Care  Goal: Plan of Care Review  Outcome: Progressing  Goal: Patient-Specific Goal (Individualized)  Outcome: Progressing  Goal: Absence of Hospital-Acquired Illness or Injury  Outcome: Progressing  Intervention: Identify and Manage Fall Risk  Recent Flowsheet Documentation  Taken 06/05/2023 1954 by Vianne Bulls, RN  Safety Interventions:   fall reduction program maintained   lighting adjusted for tasks/safety   low bed   isolation precautions   nonskid shoes/slippers when out of bed  Intervention: Prevent Skin Injury  Recent Flowsheet Documentation  Taken 06/05/2023 1954 by Vianne Bulls, RN  Positioning for Skin: Supine/Back  Skin Protection: adhesive use limited  Intervention: Prevent and Manage VTE (Venous Thromboembolism) Risk  Recent Flowsheet Documentation  Taken 06/05/2023 2230 by Vianne Bulls, RN  VTE Prevention/Management: ambulation promoted  Goal: Optimal Comfort and Wellbeing  Outcome: Progressing  Goal: Readiness for Transition of Care  Outcome: Progressing  Goal: Rounds/Family Conference  Outcome: Progressing

## 2023-06-07 LAB — ADDON DIFFERENTIAL ONLY
BASOPHILS ABSOLUTE COUNT: 0 10*9/L (ref 0.0–0.1)
BASOPHILS RELATIVE PERCENT: 0.3 %
EOSINOPHILS ABSOLUTE COUNT: 0.5 10*9/L (ref 0.0–0.5)
EOSINOPHILS RELATIVE PERCENT: 3.9 %
LYMPHOCYTES ABSOLUTE COUNT: 2.1 10*9/L (ref 1.1–3.6)
LYMPHOCYTES RELATIVE PERCENT: 15.5 %
MONOCYTES ABSOLUTE COUNT: 0.6 10*9/L (ref 0.3–0.8)
MONOCYTES RELATIVE PERCENT: 4.7 %
NEUTROPHILS ABSOLUTE COUNT: 10 10*9/L — ABNORMAL HIGH (ref 1.8–7.8)
NEUTROPHILS RELATIVE PERCENT: 75.6 %

## 2023-06-07 LAB — BASIC METABOLIC PANEL
ANION GAP: 11 mmol/L (ref 5–14)
BLOOD UREA NITROGEN: 58 mg/dL — ABNORMAL HIGH (ref 9–23)
BUN / CREAT RATIO: 5
CALCIUM: 7.3 mg/dL — ABNORMAL LOW (ref 8.7–10.4)
CHLORIDE: 106 mmol/L (ref 98–107)
CO2: 24 mmol/L (ref 20.0–31.0)
CREATININE: 10.83 mg/dL — ABNORMAL HIGH
EGFR CKD-EPI (2021) FEMALE: 4 mL/min/{1.73_m2} — ABNORMAL LOW (ref >=60)
GLUCOSE RANDOM: 98 mg/dL (ref 70–179)
POTASSIUM: 2.8 mmol/L — ABNORMAL LOW (ref 3.4–4.8)
SODIUM: 141 mmol/L (ref 135–145)

## 2023-06-07 LAB — CBC
HEMATOCRIT: 29.5 % — ABNORMAL LOW (ref 34.0–44.0)
HEMOGLOBIN: 9.4 g/dL — ABNORMAL LOW (ref 11.3–14.9)
MEAN CORPUSCULAR HEMOGLOBIN CONC: 31.8 g/dL — ABNORMAL LOW (ref 32.0–36.0)
MEAN CORPUSCULAR HEMOGLOBIN: 28.7 pg (ref 25.9–32.4)
MEAN CORPUSCULAR VOLUME: 90.3 fL (ref 77.6–95.7)
MEAN PLATELET VOLUME: 7.3 fL (ref 6.8–10.7)
PLATELET COUNT: 320 10*9/L (ref 150–450)
RED BLOOD CELL COUNT: 3.26 10*12/L — ABNORMAL LOW (ref 3.95–5.13)
RED CELL DISTRIBUTION WIDTH: 13.3 % (ref 12.2–15.2)
WBC ADJUSTED: 13.3 10*9/L — ABNORMAL HIGH (ref 3.6–11.2)

## 2023-06-07 LAB — PHOSPHORUS: PHOSPHORUS: 7.3 mg/dL — ABNORMAL HIGH (ref 2.4–5.1)

## 2023-06-07 LAB — MAGNESIUM: MAGNESIUM: 1.8 mg/dL (ref 1.6–2.6)

## 2023-06-07 MED ADMIN — calcitriol (ROCALTROL) capsule 0.5 mcg: .5 ug | ORAL | @ 12:00:00

## 2023-06-07 MED ADMIN — sevelamer (RENVELA) tablet 1,600 mg: 1600 mg | ORAL | @ 20:00:00

## 2023-06-07 MED ADMIN — dianeal low CA - dextrose 1.5% and calcium 2.5 5,000 mL infusion: INTRAPERITONEAL | @ 01:00:00

## 2023-06-07 MED ADMIN — atorvastatin (LIPITOR) tablet 80 mg: 80 mg | ORAL | @ 12:00:00

## 2023-06-07 MED ADMIN — fluticasone furoate (ARNUITY ELLIPTA) 100 mcg/actuation inhaler 1 puff: 1 | RESPIRATORY_TRACT | @ 12:00:00

## 2023-06-07 MED ADMIN — potassium chloride ER tablet 40 mEq: 40 meq | ORAL | @ 20:00:00 | Stop: 2023-06-07

## 2023-06-07 MED ADMIN — apixaban (ELIQUIS) tablet 5 mg: 5 mg | ORAL | @ 12:00:00 | Stop: 2023-06-07

## 2023-06-07 MED ADMIN — potassium chloride ER tablet 40 mEq: 40 meq | ORAL | @ 12:00:00 | Stop: 2023-06-07

## 2023-06-07 MED ADMIN — aspirin chewable tablet 81 mg: 81 mg | ORAL | @ 12:00:00

## 2023-06-07 MED ADMIN — sevelamer (RENVELA) tablet 1,600 mg: 1600 mg | ORAL | @ 12:00:00

## 2023-06-07 MED ADMIN — lactated ringers bolus 500 mL: 500 mL | INTRAVENOUS | @ 14:00:00 | Stop: 2023-06-07

## 2023-06-07 MED ADMIN — white petrolatum-mineral oil-lanolin topical cream: TOPICAL | @ 16:00:00

## 2023-06-07 MED ADMIN — sevelamer (RENVELA) tablet 1,600 mg: 1600 mg | ORAL | @ 16:00:00

## 2023-06-07 NOTE — Unmapped (Signed)
OCCUPATIONAL THERAPY  Evaluation (06/07/23 1031)    Patient Name:  Megan Robertson       Medical Record Number: [id number]   Date of Birth: October 13, 1971  Sex: Female      Post-Discharge Occupational Therapy Recommendations:   5x weekly, Low intensity       OT DME Recommendations: Defer to post acute -              OT Treatment Diagnosis:  Pt presents to OT evaluation with decreased activity tolerance and mobility impacting safe and independent ADL participation    Assessment  Problem List: Decreased activity tolerance, Decreased endurance, Decreased mobility, Fall risk, Impaired ADLs  Assessment: Megan Robertson is a 52 y.o. 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 Patient seen for initial OT evaluation and occupational profile. With consideration of patient's occupational profile, assessment review, level of clinical decision making involved, and intervention plan, patient presents as a moderate complexity case w/ the following functional deficits: decreased activity tolerance  and increased fall risk  that impact independent participation in ADLs. Recommend post-acute OT 5x Low/week to maximize safety and functional independence.  Today's Interventions: Pt educated on role of OT, OT POC, importance of OOB/ADL participation with Engineer, manufacturing. Pt able to complete supine > sit with HOB raised + SBA, STS from low bed with SBA + RW and increased time, steps forward and backward with SBA + RW, max A for donning/doffing socks. Pt limited 2/2 limited length with dialysis and decreased activity tolerance.      AM-PAC-Daily Activity  Lower Body Dressing assistance needs: A lot - Maximum/Moderate Assistance  Bathing assistance needs: A lot - Maximum/Moderate Assistance  Toileting assistance needs: A lot - Maximum/Moderate Assistance  Upper Body Dressing assistance needs: A Little - Minimal/Contact Guard Assist/Supervision  Personal Grooming assistance needs: A Little - Minimal/Contact Guard Assist/Supervision  Eating Meals assistance needs: A Little - Minimal/Contact Guard Assist/Supervision    Daily Activity Score:  Daily Activity Score: 15    Score (in points): % of Functional Impairment, Limitation, Restriction  6: 100% impaired, limited, restricted  7-8: At least 80%, but less than 100% impaired, limited restricted  9-13: At least 60%, but less than 80% impaired, limited restricted  14-19: At least 40%, but less than 60% impaired, limited restricted  20-22: At least 20%, but less than 40% impaired, limited restricted  23: At least 1%, but less than 20% impaired, limited restricted  24: 0% impaired, limited restricted    Activity Tolerance During Today's Session  Tolerated treatment well      Plan  Planned Frequency of Treatment: Plan of Care Initiated: 06/07/23    1-2x per day for: 3-4x week    Planned Treatment Duration: 07/05/23    Planned Interventions:  Clinical research associate, Education (Patient/Family/Caregiver), Home Exercise Program, Manual Therapy, Modalities, Orthotic Management, Neuromuscular Re-education, Self-Care/Home Training, Therapeutic Exercise, Therapeutic Activity, Visual/Perceptual training      GOALS:   Patient and Family Goals: To go home    Short Term:  SHORT GOAL #1: Pt will complete LB dressing with min A + AE PRN   Time Frame : 2 weeks    SHORT GOAL #2: Pt will tolerate 5+ minute standing ADL with mod I   Time  Frame : 2 weeks    SHORT GOAL #3: Pt will complete toileting with min A + AE PRN   Time Frame : 2 weeks                       Long Term Goal #1: Pt will score 22+/24 on AMPAC within 4 weeks    Time Frame: 4 weeks    Prognosis:  Good  Positive Indicators:  PLOF, support  Barriers to Discharge: Endurance deficits    Subjective  Medical Updates Since Last Visit:      Prior Functional Status   Pt reported IND with ADL and with functional mobility, occasionally needing help for tub t/f and stairs from husband. Pt debued falls.    Living Situation  Living Environment: Trailer/Mobile home  Lives With: Spouse  Home Living: One level home, Tub/shower unit, Standard height toilet, Stairs to enter without rails, Shower chair with back  Number of Stairs to Enter (outside): 4         Equipment available at home: Straight cane, Rollator         Past Medical History:   Diagnosis Date    Abnormal mammogram     Abnormal Pap smear of cervix     Allergic     Anemia     Anxiety     Arthritis     Asthma     Back pain     Cancer (CMS-HCC) Ovarian    Depression     Diabetes mellitus (CMS-HCC) Borderline    Eczema     ESRD (end stage renal disease) on dialysis (CMS-HCC)     FSGS (focal segmental glomerulosclerosis) 1997    renal biopsy at [facility]    GERD (gastroesophageal reflux disease)     Gout     H/O adenoidectomy     had adenoids removed    Hypercholesteremia     Hypertension     Keloid     Ovarian cancer (CMS-HCC)     Pancreatitis     Psoriasis     QT prolongation 06/04/2023    Seizure (CMS-HCC)     unknown etiology; none for several years    Stroke (CMS-HCC) 2000    Social History     Tobacco Use    Smoking status: Former     Current packs/day: 0.00     Average packs/day: 2.0 packs/day for 1 year (2.0 ttl pk-yrs)     Types: Cigarettes     Start date: 06/30/2009     Quit date: 06/30/2010     Years since quitting: 12.9    Smokeless tobacco: Never   Substance Use Topics    Alcohol use: Not Currently      Past Surgical History:   Procedure Laterality Date    APPENDECTOMY      BREAST EXCISIONAL BIOPSY Right 11/28/2016    CHEMOTHERAPY      for ovarian CA in 2000    CHG US GUIDE, VASCULAR ACCESS N/A 01/29/2020    Procedure: ULTRASOUND GUIDANCE FOR VASC ACCESS REQUIRING [place] EVAL OF POTENTIAL ACCESS SITES;  Surgeon: Leona Carry, MD; Location: MAIN OR [facility];  Service: Transplant    CHG US GUIDE, VASCULAR ACCESS N/A 06/08/2020    Procedure: ULTRASOUND GUIDANCE FOR VASC ACCESS REQUIRING [place] EVAL OF POTENTIAL ACCESS SITES;  Surgeon: Leona Carry, MD;  Location: MAIN OR [facility];  Service: Transplant    CHOLECYSTECTOMY      COMBINED HYSTEROSCOPY DIAGNOSTIC / D&C  07/07/2015    Planned for endometrial ablation, but patient had uterine perforation after D&C and unable to perform    HYSTERECTOMY      LEFT OOPHORECTOMY Left 04/12/2000    Removed with L fallopian tube for ectopic pregnancy    OOPHORECTOMY      PR COLONOSCOPY W/BIOPSY SINGLE/MULTIPLE  09/12/2021    Procedure: COLONOSCOPY, FLEXIBLE, PROXIMAL TO SPLENIC FLEXURE; WITH BIOPSY, SINGLE OR MULTIPLE;  Surgeon: Maris Berger, MD;  Location: GI PROC[facility]URES [facility];  Service: Gastroenterology    PR COLSC FLX W/RMVL OF TUMOR POLYP LESION SNARE TQ N/A 09/12/2021    Procedure: COLONOSCOPY FLEX; W/REMOV TUMOR/LES BY SNARE;  Surgeon: Maris Berger, MD;  Location: [facility]URES [facility];  Service: Gastroenterology    PR CREAT AV FISTULA,AUTOGENOUS GRAFT Left 01/29/2020    Procedure: Create Av Fistula (Separt Proc); Autog Gft;  Surgeon: Leona Carry, MD;  Location: MAIN OR [facility];  Service: Transplant    PR CREAT AV FISTULA,NON-AUTOGENOUS GRAFT Left 06/08/2020    Procedure: CREATE AV FISTULA (SEPARATE PROC); NONAUTOGENOUS GRAFT (EG, BIOLOGICAL COLLAGEN, THERMOPLASTIC GRAFT);  Surgeon: Leona Carry, MD;  Location: MAIN OR [facility];  Service: Transplant    PR EXPLORATION N/FLWD SURG UPPER EXTREMITY ARTERY Left 08/02/2020    Procedure: Exploration Not Followed By Surgical Repair, Artery; Upper Extremity (Eg, Axillary, Brachial, Radial, Ulnar);  Surgeon: Earney Mallet, MD;  Location: MAIN OR [facility];  Service: Vascular    PR INCIS/DRAIN ARM,DEEP ABSC/HEMATOMA Left 08/14/2020    Procedure: INCISION AND DRAINAGE, UPPER ARM OR ELBOW AREA; DEEP ABSCESS OR HEMATOMA;  Surgeon: Earney Mallet, MD;  Location: MAIN OR [place];  Service: Vascular    PR INSERTION TUNNEL INTRAPERITONEAL CATH DIAL OPEN Midline 02/01/2021    Procedure: INSERTION OF INTRAPERITONEAL CANNULA OR CATHETER FOR DRAINAGE OR DIALYSIS; PERMANENT;  Surgeon: Loney Hering, MD;  Location: MAIN OR [place];  Service: Transplant    PR REMV ART CLOT AXILL-BRACH,ARM INCIS Left 08/02/2020    Procedure: Embolect/Thrombec; Axilry/Brachial Art-Arm Incs;  Surgeon: Earney Mallet, MD;  Location: MAIN OR [facility];  Service: Vascular    PR UPPER GI ENDOSCOPY,BIOPSY N/A 08/29/2017    Procedure: UGI ENDOSCOPY; WITH BIOPSY, SINGLE OR MULTIPLE;  Surgeon: Neysa Hotter, MD;  Location: GI PROCEDURES [place] [facility];  Service: Gastroenterology    PR VEIN BYPASS GRAFT,SUBCL-BRACHIAL Left 08/05/2020    Procedure: Bypass Graft, With Vein; Subclavian-Brachial;  Surgeon: Earney Mallet, MD;  Location: MAIN OR [facility];  Service: Vascular    SALPINGECTOMY Left 04/12/2000    Ectopic pregnancy    SALPINGECTOMY Right 11/22/2015    at time of total vaginal hysterectomy    TONSILECTOMY, ADENOIDECTOMY, BILATERAL MYRINGOTOMY AND TUBES      TOTAL VAGINAL HYSTERECTOMY  11/22/2015    With R salpingectomy    Family History   Adopted: Yes   Problem Relation Age of Onset    Heart attack Mother 13    Alcohol abuse Mother     Mental illness Mother     No Known Problems Father     No Known Problems Sister     No Known Problems Daughter     No Known Problems Maternal Grandmother     No Known Problems Maternal Grandfather     No Known Problems Paternal Grandmother     No Known Problems Paternal Grandfather     No Known Problems Brother     No Known Problems Son     No Known Problems Maternal Aunt  No Known Problems Maternal Uncle     No Known Problems Paternal Aunt     No Known Problems Paternal Uncle     No Known Problems Other     Anesthesia problems Neg Hx     Broken bones Neg Hx     Cancer Neg Hx     Clotting disorder Neg Hx     Collagen disease Neg Hx     Diabetes Neg Hx     Dislocations Neg Hx     Fibromyalgia Neg Hx     Gout Neg Hx     Hemophilia Neg Hx     Osteoporosis Neg Hx     Rheumatologic disease Neg Hx     Scoliosis Neg Hx     Severe sprains Neg Hx     Sickle cell anemia Neg Hx     Spinal Compression Fracture Neg Hx     Melanoma Neg Hx     Basal cell carcinoma Neg Hx     Squamous cell carcinoma Neg Hx     Breast cancer Neg Hx         Cephalexin, Mango, Peach (prunus persica), Wellbutrin [bupropion hcl], Egg, Fish containing products, Lactase-rennet, Lexiscan [regadenoson], Mushroom, and Tomato (solanum lycopersicum)     Objective Findings  Patient / Caregiver reports: My husband usually helps me out and my son came down to help him    Precautions / Restrictions  Non-applicable       Weight Bearing  Non-applicable    Required Braces or Orthoses  Non-applicable    Communication Preference  Verbal    Pain  Pt denied pain    Equipment / Environment        Functional Mobility  Transfers: Standby assist  Bed Mobility - Needs Assistance: Standby assist  Ambulation: SBA + RW    ADLs  ADLs: Needs assistance with ADLs  ADLs - Needs Assistance: Feeding, Grooming, Bathing, Toileting, UB dressing, LB dressing  Feeding - Needs Assistance: Set Up Assist, Performed seated  Grooming - Needs Assistance: Set Up Assist, Performed seated  Bathing - Needs Assistance: Mod assist, Performed seated  Toileting - Needs Assistance: Min assist, Performed seated  UB Dressing - Needs Assistance: Min assist  LB Dressing - Needs Assistance: Max assist    Vitals / Orthostatics  Vitals/Orthostatics: VSS    Cognition   Orientation Level:  Oriented x 4   Arousal/Alertness:  Appropriate responses to stimuli   Attention Span:  Appears intact   Memory:  Appears intact   Following Commands:  Follows all commands and directions without difficulty   Safety Judgment:  Good awareness of safety precautions   Awareness of Errors and Problem Solving:  Patient self-corrected errors   Comments:      Vision / Hearing   Vision: Glasses present       Hearing: No deficit identified         Hand Function:  Right Hand Function: Right hand grip strength, ROM and coordination WNL  Left Hand Function: Left hand grip strength, ROM and coordination WNL  Hand Function comments: Pt reported intermittent difficulty with grasp when experiencing increased numbness in hands  Hand Dominance: Unknown    Skin Inspection:  Skin Inspection: Intact where visualized    ROM / Strength:  UE ROM/Strength: Left WFL, Right WFL  UE ROM/ Strength Comment: Deconditioning    LE ROM/Strength: Left WFL, Right WFL  LE ROM/ Strength Comment: Deconditioning    Coordination:  Coordination: Not tested  Sensation:  Sensory/ Proprioception/ Stereognosis comments: Pt reported constant numbness and tingling in B hands and B feet    Balance:  Static Sitting-Level of Assistance: Supervision  Dynamic Sitting-Level of Assistance: Clinical biochemist Standing-Level of Assistance: Stand by assistance  Dynamic Standing - Level of Assistance: Stand by assistance         Patient at end of session: All needs in reach, In bed, Nurse notified, Lines intact     Occupational Therapy Session Duration  OT Individual [mins]: 10  OT Co-Treatment [mins]: 8 Scarlette Shorts PT)  Reason for Co-treatment: To safely progress mobility         I attest that I have reviewed the above information.  Signed: Marijo Conception, OT  Filed 06/07/2023

## 2023-06-07 NOTE — Unmapped (Signed)
Nephrology ([facility]) Progress Note    Assessment & Plan:   Megan Robertson is a 52 y.o. 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     Principal Problem:    Hypotension  Active Problems:    Leukocytosis    ESRD (end stage renal disease) on dialysis (CMS-HCC)    Nausea    QT prolongation    Active Problems    Diarrhea  Hypotension - improving  Leukocytosis  Poor PO intake  Reported a few days of diarrhea, poor PO intake prior to admission. Questionable PD output. Suspect hypotension on presentation due to overall hypovolemia, repleted with NS 250 mL bolus x2 in ED. Initial concern for pericardial effusion on presentation, only trivial effusion on formal TTE. Infectious etiology at this time unlikely given PD fluid having low cell count, fluid cx NGTD, blood cx NGTD x48 hours, urine cx noted mixed flora, RPP negative. BP remarkable at 99/58 on 8/15. Orthostatics obtained 8/14 unremarkable for orthostatic hypotension. 8 AM morning cortisol unremarkable.  Patient endorses vaginal discharge for past 24 hours.  She describes discharge as clear in color with associated itching.  Upon physical examination of area, patient noted to have small abrasion at 1 o'clock position on labia majora.  She endorses significant pruritus to area along with pruritus to L innner thigh.  - Vaginitis screen  - Candida screening  - Eucerin cream  - UA  - CXR  - Follow-up Urine cx, C. diff, GIPP  - Calorie count to assess nutrition  - LR bolus 500 mL x1    Shortness of Breath - resolved  Patient breathing comfortably on room air. Pericardial effusion trivial.  - continuous O2 sat  - PT/OT to evaluate patient    ESRD 2/2 FSGS on PD  As above, seems to have been continuing with aggressive fluid removal at home despite poor PO intake and diarrhea, likely contributing to her hypotension on presentation.   - PD daily    Chronic Conditions  Hyperphosphatemia - Secondary hyperparathyroidism  - Continue calcitrol  - Daily phosphorous level  - Continue sevelamer 1600 mg 3 times daily     HTN: holding amlodipine and Imdur in setting of hypotension  Gout: holding home allopurinol (patient states it was recently discontinued)  Asthma: continue maintenance ICS, PRN albuterol (could consider simplification to ICS/SABA combo as outpatient)  Chronic pain: No recent fills of low-dose naltrexone, so not continuing. Also has not recently been taking Lyrica    Daily Checklist:  Diet: Regular Diet  DVT PPx: [name]  Electrolytes: Replete Potassium to >/= 3.6 and Magnesium to >/= 1.8  Code Status: Full Code  Dispo: Continue floor care     Team Contact Information:   Primary Team: Nephrology ([facility])  Primary Resident: Bard Herbert, MD  Resident's Pager: [phone] (Nephrology Intern - Tower)    Interval History:   No significant events reported overnight per nursing staff.  Nursing staff does report patient complaining of pruritus to vaginal area.  Blood pressure noted to be stable with systolic in the low 100s.  Patient seen this morning resting comfortably in bed in no acute distress.  She does endorse pruritus present to labia majora and L inner thigh.  She  also endorses pruritus to abrasion to lower back.  She tolerated PD well overnight with 602 mL output.  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 Irritation present to L inner thigh. Small abrasion noted to labia majora, no significant bleeding noted. Abrasion present to lower back with surrounding xeroderma.     Bard Herbert, MD  Internal Medicine, PGY-1    Portions of this record have been created using Dragon dictation software. Dictation errors have been sought, but may not have been identified and corrected.

## 2023-06-07 NOTE — Unmapped (Signed)
Nephrology ([facility]) Progress Note    Assessment & Plan:   Megan Robertson is a 52 y.o. 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     Principal Problem:    Hypotension  Active Problems:    Leukocytosis    ESRD (end stage renal disease) on dialysis (CMS-HCC)    Nausea    QT prolongation    Active Problems    Diarrhea  Hypotension - improving  Leukocytosis  Reported a few days of diarrhea, poor PO intake prior to admission. Questionable PD output. Suspect hypotension on presentation due to overall hypovolemia, repleted with NS 250 mL bolus x2 in ED. Initial concern for pericardial effusion on presentation, only trivial effusion on formal TTE. Infectious etiology at this time unlikely given PD fluid having low cell count, fluid cx NGTD, blood cx NGTD x48 hours, urine cx noted mixed flora, RPP negative. BP remarkable at 99/58 on 8/15. Orthostatics obtained 8/14 unremarkable for orthostatic hypotension. 8 AM morning cortisol unremarkable.   - Follow-up Urine cx, C. diff, GIPP  - Continue volume resuscitation as necessary    Shortness of Breath - resolved  Patient breathing comfortably on room air. Pericardial effusion trivial.  - continuous O2 sat  - PT/OT to evaluate patient    ESRD 2/2 FSGS on PD  As above, seems to have been continuing with aggressive fluid removal at home despite poor PO intake and diarrhea, likely contributing to her hypotension on presentation.   - PD daily    Chronic Conditions  Hyperphosphatemia - Secondary hyperparathyroidism  - Continue calcitrol  - Daily phosphorous level  - Continue sevelamer 1600 mg 3 times daily     HTN: holding amlodipine and Imdur in setting of hypotension  Gout: holding home allopurinol (patient states it was recently discontinued)  Asthma: continue maintenance ICS, PRN albuterol (could consider simplification to ICS/SABA combo as outpatient)  Chronic pain: No recent fills of low-dose naltrexone, so not continuing. Also has not recently been taking Lyrica    Daily Checklist:  Diet: Regular Diet  DVT PPx: [name]  Electrolytes: Replete Potassium to >/= 3.6 and Magnesium to >/= 1.8  Code Status: Full Code  Dispo: Continue floor care     Team Contact Information:   Primary Team: Nephrology ([facility])  Primary Resident: Bard Herbert, MD  Resident's Pager: [phone] (Nephrology Intern - Tower)    Interval History:   No significant events reported overnight by nursing staff.  Patient remained on PD throughout the night.  Nursing staff reports unable to send C. difficile/GIPP for evaluation given lack of BM for patient.  Patient again has early morning hypotension to 99/55 with MAP 68.  Repeat BP noted to have improved to 117/59.  Orthostatics obtained 8/14 noted no significant hypotension when patient standing.  Patient was seen this morning resting comfortably in bed in no acute distress.  She reported no acute complaints at this time and wished to sleep.    Objective:   Temp:  [36.6 ??C (97.9 ??F)-37 ??C (98.6 ??F)] 36.9 ??C (98.4 ??F)  Heart Rate:  [89-102] 99  Resp:  [17-21] 20  BP: (99-120)/(52-65) 102/52  SpO2:  [96 %-100 %] 97 %      Intake/Output Summary (Last  24 hours) at 06/06/2023 2221  Last data filed at 06/06/2023 1546  Gross per 24 hour   Intake 240 ml   Output 752 ml   Net -512 ml     Gen: NAD, converses   HENT: atraumatic, normocephalic  Heart: RRR  Lungs: CTAB, comfortable work of breathing  Abdomen: soft, NTND, no guarding  Extremities: No edema  Neuro: oriented    Bard Herbert, MD  Internal Medicine, PGY-1    Portions of this record have been created using Dragon dictation software. Dictation errors have been sought, but may not have been identified and corrected.

## 2023-06-07 NOTE — Unmapped (Addendum)
Pt has been alert and oriented throughout the shift. PD session in progress. SBP stable in low 100's. Pt c/o dry chap feeling in between lips of vaginal area; MD notified. Bed locked in the lowest position with two side rails up. Pt is free from fall/injury.  Problem: Adult Inpatient Plan of Care  Goal: Plan of Care Review  Outcome: Progressing  Goal: Patient-Specific Goal (Individualized)  Outcome: Progressing  Goal: Absence of Hospital-Acquired Illness or Injury  Outcome: Progressing  Intervention: Identify and Manage Fall Risk  Recent Flowsheet Documentation  Taken 06/06/2023 2000 by Leisa Lenz, RN  Safety Interventions:   low bed   fall reduction program maintained  Intervention: Prevent and Manage VTE (Venous Thromboembolism) Risk  Recent Flowsheet Documentation  Taken 06/06/2023 2150 by Leisa Lenz, RN  VTE Prevention/Management: ambulation promoted  Goal: Optimal Comfort and Wellbeing  Outcome: Progressing  Goal: Readiness for Transition of Care  Outcome: Progressing  Goal: Rounds/Family Conference  Outcome: Progressing     Problem: Wound  Goal: Optimal Coping  Outcome: Progressing  Goal: Optimal Functional Ability  Outcome: Progressing  Goal: Absence of Infection Signs and Symptoms  Outcome: Progressing  Goal: Improved Oral Intake  Outcome: Progressing  Goal: Optimal Pain Control and Function  Outcome: Progressing  Goal: Skin Health and Integrity  Outcome: Progressing  Intervention: Optimize Skin Protection  Recent Flowsheet Documentation  Taken 06/06/2023 2000 by Alan Mulder A, RN  Pressure Reduction Techniques: frequent weight shift encouraged  Head of Bed (HOB) Positioning: HOB at 30-45 degrees  Pressure Reduction Devices: pressure-redistributing mattress utilized  Goal: Optimal Wound Healing  Outcome: Progressing     Problem: Self-Care Deficit  Goal: Improved Ability to Complete Activities of Daily Living  Outcome: Progressing     Problem: Fatigue  Goal: Improved Activity Tolerance  Outcome: Progressing     Problem: Fall Injury Risk  Goal: Absence of Fall and Fall-Related Injury  Outcome: Progressing  Intervention: Promote Injury-Free Environment  Recent Flowsheet Documentation  Taken 06/06/2023 2000 by Leisa Lenz, RN  Safety Interventions:   low bed   fall reduction program maintained     Problem: Skin Injury Risk Increased  Goal: Skin Health and Integrity  Outcome: Progressing  Intervention: Optimize Skin Protection  Recent Flowsheet Documentation  Taken 06/06/2023 2000 by Alan Mulder A, RN  Pressure Reduction Techniques: frequent weight shift encouraged  Head of Bed (HOB) Positioning: HOB at 30-45 degrees  Pressure Reduction Devices: pressure-redistributing mattress utilized

## 2023-06-07 NOTE — Unmapped (Signed)
ADULT PERITONEAL DIALYSIS TREATMENT NOTE    PROCEDURE DATE/TIME:    06/06/23 10:25 PM PD THERAPY DAY:  3 PD DEVICE:   (16825/Jelly)     THERAPY TYPE:  Continuous Cycling Peritoneal Dialysis - Standard     CONSENT:    Written consent was obtained prior to the procedure and is detailed in the medical record. Prior to the start of the procedure, a time out was taken and the identity of the patient was confirmed via name, medical record number and date of birth.    Active Dialysis Orders (168h ago, onward)       Start     Ordered    06/05/23 1705  Peritoneal Dialysis - CCPD Standard  Daily      Question Answer Comment   Therapy Time (hours) Other (please specify) 18   Total Number of Cycles 6    Exchange Volume (L) 2L    Day dwell/Last fill (mL) 1000    Dialysate last fill with bag as ordered        06/05/23 1704                    VITAL SIGNS:  Vitals:    06/06/23 2058   BP: 102/52   Pulse: 99   Resp: 20   Temp: 36.9 ??C (98.4 ??F)   SpO2: 97%    Vitals:    06/04/23 0904 06/06/23 1610   Weight: 79.9 kg (176 lb 1.6 oz) 82.7 kg (182 lb 6.9 oz)        LAB RESULTS:    Potassium   Date Value Ref Range Status   06/06/2023 3.0 (L) 3.4 - 4.8 mmol/L Final       DIALYSATE FLUID:  Dianeal Solution: Dextrose 1.5% Calcium 2.5 mEq/L   Additives:  None    ACCESS:  Peritoneal Dialysis Catheter Continuous cycling Left lower abdomen (Active)       Peritoneal Dialysis Catheter Left lower abdomen (Active)   Site Assessment Clean;Dry;Intact 06/06/23 2156   Dressing Occlusive 06/06/23 2111   Dressing Status      Clean;Dry;Intact/not removed 06/06/23 2111   Dressing Change Due 06/11/23 06/06/23 2111   Dressing Intervention No intervention needed 06/06/23 2111   Status Accessed 06/06/23 2111   PD Catheter Transfer Set [employer] Connector 06/06/23 2111     Patient Lines/Drains/Airways Status       Active Peripheral & Central Intravenous Access       Name Placement date Placement time Site Days    Peripheral IV 06/03/23 Anterior;Right Forearm 06/03/23  2105  Forearm  3    Peripheral IV 06/04/23 Anterior;Right;Upper Arm 06/04/23  0250  Arm  2                    SETTINGS:  Mode: Standard Minimum Drain Volume (%): 85%   Smart Dwells: Yes Heater Bag Empty: No   Tidal Full Drains: No Flush: Yes   Program Locked: No I-Drain Alarm (mL): 0 mL   Program Verfied: Yes     Lines Unclamped:  Yes.      INITIAL DRAIN:    Inital Outflow Effluent Appearance: Clear Initial Drain Volume (mL): 1048 mL     THERAPY DETAILS:  Peritoneal Dialysis Fill Volume (ml): 2000 ml Peritoneal Dialysis Total Volume (ml): 13000 ml   Average Dwell Time (Minutes): 154 Minutes Effluent Appearance: Clear     EXCHANGE NET BALANCE:    PD Net Exchange Output (mL): 602 ml  DIALYSIS ON-CALL NURSE PAGER NUMBER:  Monday thru Friday 0700 - 1730: Call the Dialysis Unit ext. [phone]   After 1730 and all day Sunday: Call the Dialysis RN Pager Number [phone]     PROCEDURE REVIEW, VERIFICATION, HANDOFF:  PD settings verified, procedure reviewed, and instructions given to primary RN.  Dialysis RN Verifying: Nicholes Rough RN Primary PD RN Verifying: Leonia Corona. RN

## 2023-06-07 NOTE — Unmapped (Signed)
PHYSICAL THERAPY  Evaluation (06/07/23 1030)          Patient Name:  Megan Robertson       Medical Record Number: [id number]   Date of Birth: [birth year]  Sex: Female        Post-Discharge Physical Therapy Recommendations:  PT Post Acute Discharge Recommendations: Skilled PT services indicated, 5x weekly, Low intensity (progress to 3x)   Equipment Recommendation  PT DME Recommendations: Defer to post acute             Treatment Diagnosis: Decreased mobility, decreased endurance     Activity Tolerance: Tolerated treatment well     ASSESSMENT  Problem List: Decreased mobility, Gait deviation, Fall risk, Impaired ADLs, Impaired sensation, Decreased endurance, Increased edema, Impaired balance      Assessment : Megan Robertson is a 52 y.o. 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     Patient with pertinent PMH above who presents to PT with associated deficits in mobility and endurance. She is able to complete transfers and brief ambulation with CGA but is significantly limited by decreased endurance. She is not safe to discharge home at this time from a PT perspective, but will hopefully progress with continued acute PT. Recommending post-acute PT frequency of 5xL to progress deficits needed to safely return to PLOF.    After a review of the personal factors, comorbidities, clinical presentation, and examination of the number of affected body systems, the patient presents as a moderate complexity case.         Today's Interventions: PT eval, supine <> sit, STS, side steps EOB, education re: PT role, POC, safe mobiltiy, safe DME use                 Clinical Decision Making: Low        PLAN  Planned Frequency of Treatment: Plan of Care Initiated: 06/07/23  1-2x per day for: 3-4x week  Planned Treatment Duration: 06/21/23     Planned Interventions: Education (Patient/Family/Caregiver), Self-care / Home Management training, Gait training, Therapeutic Exercise, Therapeutic Activity     Goals:         SHORT GOAL #1: Complete bed mob indep               Time Frame : 2 weeks  SHORT GOAL #2: Complete transfers mod I with LRAD              Time Frame : 2 weeks  SHORT GOAL #3: Ambulate 82' mod I with LRAD              Time Frame : 2 weeks  SHORT GOAL #4: Navigate 4 stairs indep with no rails and LRAD               Time Frame : 2 weeks                        Long Term Goal #1: Ambulate 300' indep  Time Frame: 4 weeks     Prognosis:  Good  Positive Indicators: PLOF, participation  Barriers to Discharge: Endurance deficits     SUBJECTIVE  Communication Preference: Verbal,     Patient reports: Agreeable to PT  Prior Functional Status: Pt reported IND with ADL and with functional mobility, occasionally needing help for tub t/f and stairs from husband. Pt denied falls.  Equipment available at home: Straight cane, Rollator        Past Medical History:   Diagnosis Date    Abnormal mammogram     Abnormal Pap smear of cervix     Allergic     Anemia     Anxiety     Arthritis     Asthma     Back pain     Cancer (CMS-HCC) Ovarian    Depression     Diabetes mellitus (CMS-HCC) Borderline    Eczema     ESRD (end stage renal disease) on dialysis (CMS-HCC)     FSGS (focal segmental glomerulosclerosis) 1997    renal biopsy at [facility]    GERD (gastroesophageal reflux disease)     Gout     H/O adenoidectomy     had adenoids removed    Hypercholesteremia     Hypertension     Keloid     Ovarian cancer (CMS-HCC)     Pancreatitis     Psoriasis     QT prolongation 06/04/2023    Seizure (CMS-HCC)     unknown etiology; none for several years    Stroke (CMS-HCC) 2000            Social History     Tobacco Use    Smoking status: Former     Current packs/day: 0.00     Average packs/day: 2.0 packs/day for 1 year (2.0 ttl pk-yrs)     Types: Cigarettes     Start date: 06/30/2009     Quit date: 06/30/2010     Years since quitting: 12.9    Smokeless tobacco: Never   Substance Use Topics    Alcohol use: Not Currently       Past Surgical History:   Procedure Laterality Date    APPENDECTOMY      BREAST EXCISIONAL BIOPSY Right 11/28/2016    CHEMOTHERAPY      for ovarian CA in 2000    CHG US GUIDE, VASCULAR ACCESS N/A 01/29/2020    Procedure: ULTRASOUND GUIDANCE FOR VASC ACCESS REQUIRING [place] EVAL OF POTENTIAL ACCESS SITES;  Surgeon: Leona Carry, MD;  Location: MAIN OR [facility];  Service: Transplant    CHG US GUIDE, VASCULAR ACCESS N/A 06/08/2020    Procedure: ULTRASOUND GUIDANCE FOR VASC ACCESS REQUIRING [place] EVAL OF POTENTIAL ACCESS SITES;  Surgeon: Leona Carry, MD;  Location: MAIN OR [facility];  Service: Transplant    CHOLECYSTECTOMY      COMBINED HYSTEROSCOPY DIAGNOSTIC / D&C  07/07/2015    Planned for endometrial ablation, but patient had uterine perforation after D&C and unable to perform    HYSTERECTOMY      LEFT OOPHORECTOMY Left 04/12/2000    Removed with L fallopian tube for ectopic pregnancy    OOPHORECTOMY      PR COLONOSCOPY W/BIOPSY SINGLE/MULTIPLE  09/12/2021    Procedure: COLONOSCOPY, FLEXIBLE, PROXIMAL TO SPLENIC FLEXURE; WITH BIOPSY, SINGLE OR MULTIPLE;  Surgeon: Maris Berger, MD;  [facility]: [facility];  Service: Gastroenterology    PR COLSC FLX W/RMVL OF TUMOR POLYP LESION SNARE TQ N/A 09/12/2021    Procedure: COLONOSCOPY FLEX; W/REMOV TUMOR/LES BY SNARE;  Surgeon: Maris Berger, MD;  Location: GI PROCEDURES [facility];  Service: Gastroenterology    PR CREAT AV FISTULA,AUTOGENOUS GRAFT Left 01/29/2020    Procedure: Create Av  Fistula (Separt Proc); Autog Gft;  Surgeon: Leona Carry, MD;  Location: MAIN OR [facility];  Service: Transplant    PR CREAT AV FISTULA,NON-AUTOGENOUS GRAFT Left 06/08/2020    Procedure: CREATE AV FISTULA (SEPARATE PROC); NONAUTOGENOUS GRAFT (EG, BIOLOGICAL COLLAGEN, THERMOPLASTIC GRAFT);  Surgeon: Leona Carry, MD;  Location: MAIN OR [facility];  Service: Transplant    PR EXPLORATION N/FLWD SURG UPPER EXTREMITY ARTERY Left 08/02/2020    Procedure: Exploration Not Followed By Surgical Repair, Artery; Upper Extremity (Eg, Axillary, Brachial, Radial, Ulnar);  Surgeon: Earney Mallet, MD;  Location: MAIN OR [facility];  Service: Vascular    PR INCIS/DRAIN ARM,DEEP ABSC/HEMATOMA Left 08/14/2020    Procedure: INCISION AND DRAINAGE, UPPER ARM OR ELBOW AREA; DEEP ABSCESS OR HEMATOMA;  Surgeon: Earney Mallet, MD;  Location: MAIN OR [place];  Service: Vascular    PR INSERTION TUNNEL INTRAPERITONEAL CATH DIAL OPEN Midline 02/01/2021    Procedure: INSERTION OF INTRAPERITONEAL CANNULA OR CATHETER FOR DRAINAGE OR DIALYSIS; PERMANENT;  Surgeon: Loney Hering, MD;  Location: MAIN OR [place];  Service: Transplant    PR REMV ART CLOT AXILL-BRACH,ARM INCIS Left 08/02/2020    Procedure: Embolect/Thrombec; Axilry/Brachial Art-Arm Incs;  Surgeon: Earney Mallet, MD;  Location: MAIN OR [facility];  Service: Vascular    PR UPPER GI ENDOSCOPY,BIOPSY N/A 08/29/2017    Procedure: UGI ENDOSCOPY; WITH BIOPSY, SINGLE OR MULTIPLE;  Surgeon: Neysa Hotter, MD;  Location: GI PROCEDURES [place] [facility];  Service: Gastroenterology    PR VEIN BYPASS GRAFT,SUBCL-BRACHIAL Left 08/05/2020    Procedure: Bypass Graft, With Vein; Subclavian-Brachial;  Surgeon: Earney Mallet, MD;  Location: MAIN OR [facility];  Service: Vascular    SALPINGECTOMY Left 04/12/2000    Ectopic pregnancy    SALPINGECTOMY Right 11/22/2015    at time of total vaginal hysterectomy    TONSILECTOMY, ADENOIDECTOMY, BILATERAL MYRINGOTOMY AND TUBES      TOTAL VAGINAL HYSTERECTOMY  11/22/2015    With R salpingectomy             Family History   Adopted: Yes   Problem Relation Age of Onset    Heart attack Mother 77    Alcohol abuse Mother     Mental illness Mother     No Known Problems Father     No Known Problems Sister     No Known Problems Daughter     No Known Problems Maternal Grandmother     No Known Problems Maternal Grandfather     No Known Problems Paternal Grandmother     No Known Problems Paternal Grandfather     No Known Problems Brother     No Known Problems Son     No Known Problems Maternal Aunt     No Known Problems Maternal Uncle     No Known Problems Paternal Aunt     No Known Problems Paternal Uncle     No Known Problems Other     Anesthesia problems Neg Hx     Broken bones Neg Hx     Cancer Neg Hx     Clotting disorder Neg Hx     Collagen disease Neg Hx     Diabetes Neg Hx     Dislocations Neg Hx     Fibromyalgia Neg Hx     Gout Neg Hx     Hemophilia Neg Hx     Osteoporosis Neg Hx     Rheumatologic disease Neg Hx     Scoliosis Neg Hx     Severe sprains  Neg Hx     Sickle cell anemia Neg Hx     Spinal Compression Fracture Neg Hx     Melanoma Neg Hx     Basal cell carcinoma Neg Hx     Squamous cell carcinoma Neg Hx     Breast cancer Neg Hx         Allergies: Cephalexin, Mango, Peach (prunus persica), Wellbutrin [bupropion hcl], Egg, Fish containing products, Lactase-rennet, Lexiscan [regadenoson], Mushroom, and Tomato (solanum lycopersicum)                  Objective Findings  Precautions / Restrictions  Precautions: Falls precautions  Weight Bearing Status: Non-applicable  Required Braces or Orthoses: Non-applicable     Pain Comments: Denies pain  Medical Tests / Procedures: Reviewed in Epic  Equipment / Environment: Vascular access (PIV, TLC, Port-a-cath, PICC) (peritoneal dialysis)     Vitals/Orthostatics : VSS     Living Situation  Living Environment: Trailer/Mobile home  Lives With: Spouse  Home Living: One level home, Tub/shower unit, Standard height toilet, Stairs to enter without rails, Shower chair with back  Number of Stairs to Enter (outside): 4      Cognition: WFL  Visual/Perception: Wears Glasses/Contacts  Hearing: No deficit identified     Skin Inspection: Intact where visualized     Upper Extremities  UE ROM: Right WFL, Left WFL  UE Strength: Left WFL, Right WFL    Lower Extremities  LE ROM: Right WFL, Left WFL  LE Strength: Left WFL, Right WFL  LE comment: generalized weakness          Sensation: Impaired  Motor/Sensory/Neuro Comments: neuropathy in feet    Static Sitting-Level of Assistance: Supervision  Dynamic Sitting-Level of Assistance: Supervision    Static Standing-Level of Assistance: Contact guard  Dynamic Standing - Level of Assistance: Contact guard  Standing Balance comments: HHA      Bed Mobility: Supine to Sit  Supine to Sit assistance level: Contact guard assist, steadying assist  Bed Mobility comments: increased time with elevated HOB and bedrail assist     Transfers: Sit to Stand  Sit to Stand assistance level: Contact guard assist, steadying assist  Transfer comments: increased time with HHA and no psoterior LOB      Gait Level of Assistance: Contact guard assist, steadying assist  Gait Distance Ambulated (ft): 5 ft  Skilled Treatment Performed: side steps with CGA     Stairs: not assessed                 Patient at end of session: All needs in reach, In bed, Lines intact, Nurse notified    Physical Therapy Session Duration  PT Individual [mins]: 10  PT Co-Treatment [mins]: 15  Reason for Co-treatment: To safely progress mobility, Poor activity tolerance (Seen with Abbott, OT)          AM-PAC-6 click  Help currently need turning over In bed?: A Little - Minimal/Contact Guard Assist/Supervision  Help currently needed sitting down/standing up from chair with arms? : A Little - Minimal/Contact Guard Assist/Supervision  Help currently needed moving from supine to sitting on edge of bed?: A Little - Minimal/Contact Guard Assist/Supervision  Help currently needed moving to and from bed from wheelchair?: A Little - Minimal/Contact Guard Assist/Supervision  Help currently needed walking in a hospital room?: A Little - Minimal/Contact Guard Assist/Supervision  Help currently needed climbing 3-5 steps with railing?: A Little - Minimal/Contact Guard Assist/Supervision    Basic Mobility Score 6 click:  18    6 click Score (in points): % of Functional Impairment, Limitation, Restriction  6: 100% impaired, limited, restricted  7-8: At least 80%, but less than 100% impaired, limited restricted  9-13: At least 60%, but less than 80% impaired, limited restricted  14-19: At least 40%, but less than 60% impaired, limited restricted  20-22: At least 20%, but less than 40% impaired, limited restricted  23: At least 1%, but less than 20% impaired, limited restricted  24: 0% impaired, limited restricted        I attest that I have reviewed the above information.  Signed: Rise Paganini, PT  Filed 06/07/2023

## 2023-06-08 LAB — CBC W/ AUTO DIFF
BASOPHILS ABSOLUTE COUNT: 0.1 10*9/L (ref 0.0–0.1)
BASOPHILS RELATIVE PERCENT: 0.5 %
EOSINOPHILS ABSOLUTE COUNT: 0.5 10*9/L (ref 0.0–0.5)
EOSINOPHILS RELATIVE PERCENT: 3.9 %
HEMATOCRIT: 27.1 % — ABNORMAL LOW (ref 34.0–44.0)
HEMOGLOBIN: 8.8 g/dL — ABNORMAL LOW (ref 11.3–14.9)
LYMPHOCYTES ABSOLUTE COUNT: 2.4 10*9/L (ref 1.1–3.6)
LYMPHOCYTES RELATIVE PERCENT: 17.8 %
MEAN CORPUSCULAR HEMOGLOBIN CONC: 32.4 g/dL (ref 32.0–36.0)
MEAN CORPUSCULAR HEMOGLOBIN: 29.1 pg (ref 25.9–32.4)
MEAN CORPUSCULAR VOLUME: 89.6 fL (ref 77.6–95.7)
MEAN PLATELET VOLUME: 7.2 fL (ref 6.8–10.7)
MONOCYTES ABSOLUTE COUNT: 0.8 10*9/L (ref 0.3–0.8)
MONOCYTES RELATIVE PERCENT: 5.8 %
NEUTROPHILS ABSOLUTE COUNT: 9.5 10*9/L — ABNORMAL HIGH (ref 1.8–7.8)
NEUTROPHILS RELATIVE PERCENT: 72 %
PLATELET COUNT: 300 10*9/L (ref 150–450)
RED BLOOD CELL COUNT: 3.02 10*12/L — ABNORMAL LOW (ref 3.95–5.13)
RED CELL DISTRIBUTION WIDTH: 13.8 % (ref 12.2–15.2)
WBC ADJUSTED: 13.2 10*9/L — ABNORMAL HIGH (ref 3.6–11.2)

## 2023-06-08 LAB — HEMOGLOBIN AND HEMATOCRIT, BLOOD
HEMATOCRIT: 28.3 % — ABNORMAL LOW (ref 34.0–44.0)
HEMOGLOBIN: 9.2 g/dL — ABNORMAL LOW (ref 11.3–14.9)

## 2023-06-08 LAB — BASIC METABOLIC PANEL
ANION GAP: 14 mmol/L (ref 5–14)
BLOOD UREA NITROGEN: 54 mg/dL — ABNORMAL HIGH (ref 9–23)
BUN / CREAT RATIO: 5
CALCIUM: 7.7 mg/dL — ABNORMAL LOW (ref 8.7–10.4)
CHLORIDE: 106 mmol/L (ref 98–107)
CO2: 24 mmol/L (ref 20.0–31.0)
CREATININE: 10.43 mg/dL — ABNORMAL HIGH
EGFR CKD-EPI (2021) FEMALE: 4 mL/min/{1.73_m2} — ABNORMAL LOW (ref >=60)
GLUCOSE RANDOM: 100 mg/dL (ref 70–179)
POTASSIUM: 2.8 mmol/L — ABNORMAL LOW (ref 3.4–4.8)
SODIUM: 144 mmol/L (ref 135–145)

## 2023-06-08 LAB — PHOSPHORUS: PHOSPHORUS: 7.1 mg/dL — ABNORMAL HIGH (ref 2.4–5.1)

## 2023-06-08 LAB — MAGNESIUM: MAGNESIUM: 1.8 mg/dL (ref 1.6–2.6)

## 2023-06-08 LAB — POTASSIUM: POTASSIUM: 3 mmol/L — ABNORMAL LOW (ref 3.5–5.1)

## 2023-06-08 MED ADMIN — aspirin chewable tablet 81 mg: 81 mg | ORAL | @ 13:00:00

## 2023-06-08 MED ADMIN — sevelamer (RENVELA) tablet 1,600 mg: 1600 mg | ORAL | @ 22:00:00

## 2023-06-08 MED ADMIN — bisacodyl (DULCOLAX) suppository 10 mg: 10 mg | RECTAL | @ 23:00:00 | Stop: 2023-06-08

## 2023-06-08 MED ADMIN — sevelamer (RENVELA) tablet 1,600 mg: 1600 mg | ORAL | @ 17:00:00

## 2023-06-08 MED ADMIN — potassium chloride ER tablet 40 mEq: 40 meq | ORAL | @ 13:00:00 | Stop: 2023-06-08

## 2023-06-08 MED ADMIN — sevelamer (RENVELA) tablet 1,600 mg: 1600 mg | ORAL | @ 13:00:00

## 2023-06-08 MED ADMIN — dianeal low CA - dextrose 1.5% and calcium 2.5 5,000 mL infusion: INTRAPERITONEAL | @ 05:00:00

## 2023-06-08 MED ADMIN — apixaban (ELIQUIS) tablet 2.5 mg: 2.5 mg | ORAL | @ 13:00:00

## 2023-06-08 MED ADMIN — potassium chloride ER tablet 40 mEq: 40 meq | ORAL | @ 17:00:00 | Stop: 2023-06-08

## 2023-06-08 MED ADMIN — white petrolatum-mineral oil-lanolin topical cream: TOPICAL | @ 17:00:00

## 2023-06-08 MED ADMIN — white petrolatum-mineral oil-lanolin topical cream: TOPICAL | @ 13:00:00

## 2023-06-08 MED ADMIN — polyethylene glycol (MIRALAX) packet 17 g: 17 g | ORAL | @ 23:00:00

## 2023-06-08 MED ADMIN — apixaban (ELIQUIS) tablet 2.5 mg: 2.5 mg | ORAL | @ 01:00:00

## 2023-06-08 MED ADMIN — calcitriol (ROCALTROL) capsule 0.5 mcg: .5 ug | ORAL | @ 13:00:00

## 2023-06-08 MED ADMIN — atorvastatin (LIPITOR) tablet 80 mg: 80 mg | ORAL | @ 13:00:00

## 2023-06-08 MED ADMIN — white petrolatum-mineral oil-lanolin topical cream: TOPICAL | @ 01:00:00

## 2023-06-08 MED ADMIN — fluticasone furoate (ARNUITY ELLIPTA) 100 mcg/actuation inhaler 1 puff: 1 | RESPIRATORY_TRACT | @ 13:00:00

## 2023-06-08 MED ADMIN — senna (SENOKOT) tablet 2 tablet: 2 | ORAL | @ 23:00:00

## 2023-06-08 NOTE — Unmapped (Signed)
ADULT PERITONEAL DIALYSIS TREATMENT NOTE    PROCEDURE DATE/TIME:    06/08/23 5:05 AM PD THERAPY DAY:  4 PD DEVICE:   (16109)     THERAPY TYPE:  Continuous Cycling Peritoneal Dialysis - Standard     CONSENT:    Written consent was obtained prior to the procedure and is detailed in the medical record. Prior to the start of the procedure, a time out was taken and the identity of the patient was confirmed via name, medical record number and date of birth.    Active Dialysis Orders (168h ago, onward)       Start     Ordered    06/05/23 1705  Peritoneal Dialysis - CCPD Standard  Daily      Question Answer Comment   Therapy Time (hours) Other (please specify) 18   Total Number of Cycles 6    Exchange Volume (L) 2L    Day dwell/Last fill (mL) 1000    Dialysate last fill with bag as ordered        06/05/23 1704                    VITAL SIGNS:  Vitals:    06/08/23 0400   BP: 113/56   Pulse: 90   Resp: 18   Temp:    SpO2: 98%    Vitals:    06/06/23 0638 06/07/23 0537   Weight: 82.7 kg (182 lb 6.9 oz) 82.1 kg (180 lb 14.6 oz)        LAB RESULTS:    Potassium   Date Value Ref Range Status   06/07/2023 2.8 (L) 3.4 - 4.8 mmol/L Final       DIALYSATE FLUID:  Dianeal Solution: Dextrose 1.5% Calcium 2.5 mEq/L   Additives:  None    ACCESS:  Peritoneal Dialysis Catheter Continuous cycling Left lower abdomen (Active)       Peritoneal Dialysis Catheter Left lower abdomen (Active)   Site Assessment Clean;Dry;Intact 06/08/23 0130   Dressing Occlusive 06/08/23 0130   Dressing Status      Clean;Dry;Intact/not removed 06/08/23 0130   Dressing Change Due 06/11/23 06/08/23 0130   Dressing Intervention No intervention needed 06/08/23 0130   Status Accessed 06/08/23 0130   PD Catheter Transfer Set [employer] Connector 06/08/23 0130     Patient Lines/Drains/Airways Status       Active Peripheral & Central Intravenous Access       Name Placement date Placement time Site Days    Peripheral IV 06/03/23 Anterior;Right Forearm 06/03/23  2105  Forearm  4 Peripheral IV 06/04/23 Anterior;Right;Upper Arm 06/04/23  0250  Arm  4                    SETTINGS:  Mode: Standard Minimum Drain Volume (%): 85%   Smart Dwells: Yes Heater Bag Empty: No   Tidal Full Drains: No Flush: Yes   Program Locked: No I-Drain Alarm (mL): 0 mL   Program Verfied: Yes     Lines Unclamped:  Yes.      INITIAL DRAIN:    Inital Outflow Effluent Appearance: Clear Initial Drain Volume (mL): 931 mL     THERAPY DETAILS:  Peritoneal Dialysis Fill Volume (ml): 2000 ml Peritoneal Dialysis Total Volume (ml): 13000 ml   Average Dwell Time (Minutes): 153 Minutes (lost dwell 2 mins) Effluent Appearance: Clear     EXCHANGE NET BALANCE:    PD Net Exchange Output (mL): 557 ml  DIALYSIS ON-CALL NURSE PAGER NUMBER:  Monday thru Friday 0700 - 1730: Call the Dialysis Unit ext. [phone]   After 1730 and all day Sunday: Call the Dialysis RN Pager Number [phone]     PROCEDURE REVIEW, VERIFICATION, HANDOFF:  PD settings verified, procedure reviewed, and instructions given to primary RN.  Dialysis RN Verifying: Manley Mason Primary PD RN Verifying: Dyane Dustman RN

## 2023-06-08 NOTE — Unmapped (Signed)
Patient free from falls and injury this shift. VSS. RA. A&O x4. Independent with walker. No complaints of pain. Husband visited this shift. Currently getting PD treatment, to be done by 1930. Call bell and bedside table within reach. Low bed. No other needs verbalized at this time. Care ongoing.       Problem: Adult Inpatient Plan of Care  Goal: Plan of Care Review  Outcome: Progressing  Goal: Patient-Specific Goal (Individualized)  Outcome: Progressing  Goal: Absence of Hospital-Acquired Illness or Injury  Outcome: Progressing  Intervention: Identify and Manage Fall Risk  Recent Flowsheet Documentation  Taken 06/07/2023 2000 by Freda Jackson I, RN  Safety Interventions:   aspiration precautions   commode/urinal/bedpan at bedside   fall reduction program maintained   lighting adjusted for tasks/safety   low bed   nonskid shoes/slippers when out of bed  Intervention: Prevent and Manage VTE (Venous Thromboembolism) Risk  Recent Flowsheet Documentation  Taken 06/07/2023 2040 by Freda Jackson I, RN  VTE Prevention/Management: ambulation promoted  Goal: Optimal Comfort and Wellbeing  Outcome: Progressing  Goal: Readiness for Transition of Care  Outcome: Progressing  Goal: Rounds/Family Conference  Outcome: Progressing     Problem: Infection  Goal: Absence of Infection Signs and Symptoms  Outcome: Progressing     Problem: Wound  Goal: Optimal Coping  Outcome: Progressing  Goal: Optimal Functional Ability  Outcome: Progressing  Intervention: Optimize Functional Ability  Recent Flowsheet Documentation  Taken 06/07/2023 2000 by Freda Jackson I, RN  Activity Management: back to bed  Goal: Absence of Infection Signs and Symptoms  Outcome: Progressing  Goal: Improved Oral Intake  Outcome: Progressing  Goal: Optimal Pain Control and Function  Outcome: Progressing  Goal: Skin Health and Integrity  Outcome: Progressing  Intervention: Optimize Skin Protection  Recent Flowsheet Documentation  Taken 06/07/2023 2000 by Freda Jackson I, RN  Activity Management: back to bed  Head of Bed (HOB) Positioning: HOB elevated  Goal: Optimal Wound Healing  Outcome: Progressing     Problem: Self-Care Deficit  Goal: Improved Ability to Complete Activities of Daily Living  Outcome: Progressing     Problem: Fatigue  Goal: Improved Activity Tolerance  Outcome: Progressing  Intervention: Promote Improved Energy  Recent Flowsheet Documentation  Taken 06/07/2023 2000 by Freda Jackson I, RN  Activity Management: back to bed     Problem: Fall Injury Risk  Goal: Absence of Fall and Fall-Related Injury  Outcome: Progressing  Intervention: Promote Scientist, clinical (histocompatibility and immunogenetics) Documentation  Taken 06/07/2023 2000 by Freda Jackson I, RN  Safety Interventions:   aspiration precautions   commode/urinal/bedpan at bedside   fall reduction program maintained   lighting adjusted for tasks/safety   low bed   nonskid shoes/slippers when out of bed     Problem: Skin Injury Risk Increased  Goal: Skin Health and Integrity  Outcome: Progressing  Intervention: Optimize Skin Protection  Recent Flowsheet Documentation  Taken 06/07/2023 2000 by Freda Jackson I, RN  Activity Management: back to bed  Head of Bed [facility]) Positioning: HOB elevated

## 2023-06-09 LAB — PHOSPHORUS: PHOSPHORUS: 6.8 mg/dL — ABNORMAL HIGH (ref 2.4–5.1)

## 2023-06-09 LAB — BASIC METABOLIC PANEL
ANION GAP: 13 mmol/L (ref 5–14)
ANION GAP: 9 mmol/L (ref 5–14)
BLOOD UREA NITROGEN: 52 mg/dL — ABNORMAL HIGH (ref 9–23)
BLOOD UREA NITROGEN: 58 mg/dL — ABNORMAL HIGH (ref 9–23)
BUN / CREAT RATIO: 5
BUN / CREAT RATIO: 6
CALCIUM: 8 mg/dL — ABNORMAL LOW (ref 8.7–10.4)
CALCIUM: 8 mg/dL — ABNORMAL LOW (ref 8.7–10.4)
CHLORIDE: 106 mmol/L (ref 98–107)
CHLORIDE: 107 mmol/L (ref 98–107)
CO2: 24 mmol/L (ref 20.0–31.0)
CO2: 27 mmol/L (ref 20.0–31.0)
CREATININE: 9.84 mg/dL — ABNORMAL HIGH
CREATININE: 9.81 mg/dL — ABNORMAL HIGH
EGFR CKD-EPI (2021) FEMALE: 4 mL/min/{1.73_m2} — ABNORMAL LOW (ref >=60)
EGFR CKD-EPI (2021) FEMALE: 4 mL/min/{1.73_m2} — ABNORMAL LOW (ref >=60)
GLUCOSE RANDOM: 86 mg/dL (ref 70–179)
GLUCOSE RANDOM: 104 mg/dL (ref 70–179)
POTASSIUM: 3.4 mmol/L (ref 3.4–4.8)
POTASSIUM: 3.5 mmol/L (ref 3.4–4.8)
SODIUM: 143 mmol/L (ref 135–145)
SODIUM: 143 mmol/L (ref 135–145)

## 2023-06-09 LAB — CBC W/ AUTO DIFF
BASOPHILS ABSOLUTE COUNT: 0 10*9/L (ref 0.0–0.1)
BASOPHILS RELATIVE PERCENT: 0.3 %
EOSINOPHILS ABSOLUTE COUNT: 0.5 10*9/L (ref 0.0–0.5)
EOSINOPHILS RELATIVE PERCENT: 3.9 %
HEMATOCRIT: 27.4 % — ABNORMAL LOW (ref 34.0–44.0)
HEMOGLOBIN: 8.8 g/dL — ABNORMAL LOW (ref 11.3–14.9)
LYMPHOCYTES ABSOLUTE COUNT: 2.3 10*9/L (ref 1.1–3.6)
LYMPHOCYTES RELATIVE PERCENT: 19 %
MEAN CORPUSCULAR HEMOGLOBIN CONC: 32.2 g/dL (ref 32.0–36.0)
MEAN CORPUSCULAR HEMOGLOBIN: 28.7 pg (ref 25.9–32.4)
MEAN CORPUSCULAR VOLUME: 89.1 fL (ref 77.6–95.7)
MEAN PLATELET VOLUME: 7.2 fL (ref 6.8–10.7)
MONOCYTES ABSOLUTE COUNT: 0.7 10*9/L (ref 0.3–0.8)
MONOCYTES RELATIVE PERCENT: 5.6 %
NEUTROPHILS ABSOLUTE COUNT: 8.5 10*9/L — ABNORMAL HIGH (ref 1.8–7.8)
NEUTROPHILS RELATIVE PERCENT: 71.2 %
PLATELET COUNT: 313 10*9/L (ref 150–450)
RED BLOOD CELL COUNT: 3.07 10*12/L — ABNORMAL LOW (ref 3.95–5.13)
RED CELL DISTRIBUTION WIDTH: 13.6 % (ref 12.2–15.2)
WBC ADJUSTED: 12 10*9/L — ABNORMAL HIGH (ref 3.6–11.2)

## 2023-06-09 LAB — MAGNESIUM
MAGNESIUM: 1.7 mg/dL (ref 1.6–2.6)
MAGNESIUM: 1.8 mg/dL (ref 1.6–2.6)

## 2023-06-09 MED ADMIN — apixaban (ELIQUIS) tablet 2.5 mg: 2.5 mg | ORAL | @ 13:00:00

## 2023-06-09 MED ADMIN — potassium chloride ER tablet 40 mEq: 40 meq | ORAL | @ 06:00:00 | Stop: 2023-06-09

## 2023-06-09 MED ADMIN — polyethylene glycol (MIRALAX) packet 17 g: 17 g | ORAL | @ 13:00:00

## 2023-06-09 MED ADMIN — sevelamer (RENVELA) tablet 1,600 mg: 1600 mg | ORAL | @ 13:00:00

## 2023-06-09 MED ADMIN — atorvastatin (LIPITOR) tablet 80 mg: 80 mg | ORAL | @ 13:00:00

## 2023-06-09 MED ADMIN — white petrolatum-mineral oil-lanolin topical cream: TOPICAL | @ 01:00:00

## 2023-06-09 MED ADMIN — SMOG ENEMA: 240 mL | RECTAL | @ 22:00:00 | Stop: 2023-06-09

## 2023-06-09 MED ADMIN — potassium chloride ER tablet 40 mEq: 40 meq | ORAL | @ 13:00:00 | Stop: 2023-06-09

## 2023-06-09 MED ADMIN — white petrolatum-mineral oil-lanolin topical cream: TOPICAL | @ 13:00:00

## 2023-06-09 MED ADMIN — dianeal low CA - dextrose 1.5% and calcium 2.5 5,000 mL infusion: INTRAPERITONEAL | @ 04:00:00

## 2023-06-09 MED ADMIN — apixaban (ELIQUIS) tablet 2.5 mg: 2.5 mg | ORAL | @ 01:00:00

## 2023-06-09 MED ADMIN — aspirin chewable tablet 81 mg: 81 mg | ORAL | @ 13:00:00

## 2023-06-09 MED ADMIN — white petrolatum-mineral oil-lanolin topical cream: TOPICAL | @ 17:00:00

## 2023-06-09 MED ADMIN — sevelamer (RENVELA) tablet 1,600 mg: 1600 mg | ORAL | @ 17:00:00

## 2023-06-09 MED ADMIN — polyethylene glycol (MIRALAX) packet 17 g: 17 g | ORAL | @ 17:00:00

## 2023-06-09 MED ADMIN — fluticasone furoate (ARNUITY ELLIPTA) 100 mcg/actuation inhaler 1 puff: 1 | RESPIRATORY_TRACT | @ 13:00:00

## 2023-06-09 MED ADMIN — sevelamer (RENVELA) tablet 1,600 mg: 1600 mg | ORAL | @ 22:00:00

## 2023-06-09 MED ADMIN — senna (SENOKOT) tablet 2 tablet: 2 | ORAL | @ 13:00:00

## 2023-06-09 MED ADMIN — calcitriol (ROCALTROL) capsule 0.5 mcg: .5 ug | ORAL | @ 13:00:00

## 2023-06-09 NOTE — Unmapped (Signed)
Alert and oriented. On 18 hours PD treatment to stop at 1900 hours.  Complaining of no BM in five days MD notified.  Bowel regimen order given.  Assisted with ADL and passive activity encouraged as continues with PD treatment.  Problem: Adult Inpatient Plan of Care  Goal: Plan of Care Review  Outcome: Ongoing - Unchanged  Goal: Patient-Specific Goal (Individualized)  Outcome: Ongoing - Unchanged  Goal: Absence of Hospital-Acquired Illness or Injury  Outcome: Ongoing - Unchanged  Intervention: Identify and Manage Fall Risk  Recent Flowsheet Documentation  Taken 06/08/2023 0800 by Curlene Labrum, RN  Safety Interventions:   fall reduction program maintained   low bed  Intervention: Prevent Skin Injury  Recent Flowsheet Documentation  Taken 06/08/2023 0800 by Curlene Labrum, RN  Device Skin Pressure Protection:   absorbent pad utilized/changed   adhesive use limited  Skin Protection: adhesive use limited  Intervention: Prevent and Manage VTE (Venous Thromboembolism) Risk  Recent Flowsheet Documentation  Taken 06/08/2023 0800 by Curlene Labrum, RN  VTE Prevention/Management: ambulation promoted  Goal: Optimal Comfort and Wellbeing  Outcome: Ongoing - Unchanged  Goal: Readiness for Transition of Care  Outcome: Ongoing - Unchanged  Goal: Rounds/Family Conference  Outcome: Ongoing - Unchanged     Problem: Infection  Goal: Absence of Infection Signs and Symptoms  Outcome: Ongoing - Unchanged     Problem: Wound  Goal: Optimal Coping  Outcome: Ongoing - Unchanged  Goal: Optimal Functional Ability  Outcome: Ongoing - Unchanged  Goal: Absence of Infection Signs and Symptoms  Outcome: Ongoing - Unchanged  Goal: Improved Oral Intake  Outcome: Ongoing - Unchanged  Goal: Optimal Pain Control and Function  Outcome: Ongoing - Unchanged  Goal: Skin Health and Integrity  Outcome: Ongoing - Unchanged  Intervention: Optimize Skin Protection  Recent Flowsheet Documentation  Taken 06/08/2023 0800 by Curlene Labrum, RN  Pressure Reduction Techniques:   frequent weight shift encouraged   heels elevated off bed  Head of Bed (HOB) Positioning: HOB at 30-45 degrees  Pressure Reduction Devices: pressure-redistributing mattress utilized  Skin Protection: adhesive use limited  Goal: Optimal Wound Healing  Outcome: Ongoing - Unchanged     Problem: Self-Care Deficit  Goal: Improved Ability to Complete Activities of Daily Living  Outcome: Ongoing - Unchanged

## 2023-06-09 NOTE — Unmapped (Signed)
Nephrology ([facility]) Progress Note    Assessment & Plan:   Megan Robertson is a 52 y.o. 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    Principal Problem:    Hypotension  Active Problems:    GERD (gastroesophageal reflux disease)    OSA (obstructive sleep apnea)    Leukocytosis    Hypokalemia    Anemia in chronic kidney disease    ESRD (end stage renal disease) on dialysis (CMS-HCC)    Nausea    QT prolongation    Hyperphosphatemia    Constipation    Active Problems    Poor PO Intake - Uremia - ESRD 2/2 FSGS on PD  Presents with ongoing poor p.o. intake which likely contributed to her hypotension on presentation.  High suspicion is for uremia contributing to her poor appetite with inadequate cleaning with PD at home.  Will continue peritoneal dialysis to see if this helps her symptoms though she does continue to have a poor appetite.  - Calorie count  - Continue to encourage fluid intake  - Daily PD    Diarrhea (resolved) - Hypotension (resolved) - Leukocytosis   Initially presented with ongoing diarrhea as well as mild leukocytosis and hypotension.  Leukocytosis persist however diarrhea has resolved.  Has intermittently had softer blood pressures though suspect this is more in the context of hypovolemia from poor p.o. intake rather than infection.  Have discontinued broad-spectrum antibiotics.  Will get cell count of her peritoneal fluid than lower suspicion for peritonitis given no abdominal pain.  -Peritoneal fluid cell count  -Daily CBC    Constipation  Initially presented with diarrhea, unfortunately now with constipation.  Do wonder if diarrhea prior to presentation was overflow from severe constipation. Moderate to large stool burden on KUB.  Given that she is on PD, essential that she have regular bowel movements.  Will start an aggressive bowel regimen.  Have a low suspicion for obstructive process given her abdomen is soft and nontender on exam.  - Miralax TID  - Senna BID  - Dulcolax suppository today    Hypokalemia  Continue to replete PRN.  - Repeat K level tonight given 2.8 this morning     Eczema  Present on bilateral upper thighs, does not seem to be very bothersome to patient.   - Eucerin cream PRN     Vaginal Discharge  Nursing reported vaginal discharge and patient reported vulvar pruritus. BV screen negative.  - F/up yeast screen    Normocytic Anemia  Suspect secondary to chronic illness and chronic kidney disease.  Has been downtrending since admission, from 10.8 down to 8.8.  Do not have a clinical concern for bleeding at this time but will continue to watch closely.    -Daily CBC     Chronic Conditions  Hyperphosphatemia - Secondary hyperparathyroidism  - Continue calcitrol  - Daily phosphorous level  - Continue sevelamer 1600 mg 3 times daily     HTN: holding amlodipine and Imdur in setting of hypotension  Gout: holding home allopurinol (patient states it was recently discontinued)  Asthma: continue maintenance ICS, PRN albuterol (could consider simplification to ICS/SABA combo as outpatient)  Chronic pain: No recent fills of low-dose naltrexone, so not continuing. Also has not recently been taking Lyrica    Daily Checklist:  Diet: Regular Diet  DVT  PPx: [name]  Electrolytes: Replete Potassium to >/= 3.6 and Magnesium to >/= 1.8  Code Status: Full Code  Dispo: Recommended for SNF however is on PD... will need to discuss with her and her husband     Team Contact Information:   Primary Team: Nephrology (MEDB)  Primary Resident: Achille Rich, MD  Resident's Pager: [phone] (Nephrology Intern - Tower)    Interval History:     NAEON. Tolerating PD well. Recommended for SNF by PT/OT, discussed that PD would not be possible at SNF but that we would address this at a later time. Denies abdominal pain other than feeling bloated. Continues to have poor appetite and intake.    Objective:   Temp:  [36.8 ??C (98.2 ??F)-37 ??C (98.6 ??F)] 37 ??C (98.6 ??F)  Heart Rate:  [86-101] 95  Resp:  [16-20] 20  BP: (95-113)/(48-68) 107/62  SpO2:  [98 %-99 %] 99 %      Intake/Output Summary (Last 24 hours) at 06/08/2023 1802  Last data filed at 06/08/2023 1745  Gross per 24 hour   Intake --   Output 180 ml   Net -180 ml     Gen: NAD, converses   HENT: atraumatic, normocephalic  Heart: RRR  Lungs: CTAB, comfortable work of breathing  Abdomen: soft, NT, no guarding  Extremities: No edema  Neuro: oriented  Psych: seems more cheerful than prior exam, smiles

## 2023-06-09 NOTE — Unmapped (Signed)
Repleted K via PO.  PD overnight till 11AM. Denies pain when asked. BM this shift formed. Up with walker. Calorie count, pt ate 100% of dinner. Bed in low locked position. Call light within reach.     Problem: Adult Inpatient Plan of Care  Goal: Plan of Care Review  Outcome: Ongoing - Unchanged  Goal: Patient-Specific Goal (Individualized)  Outcome: Ongoing - Unchanged  Goal: Absence of Hospital-Acquired Illness or Injury  Outcome: Ongoing - Unchanged  Intervention: Identify and Manage Fall Risk  Recent Flowsheet Documentation  Taken 06/08/2023 2103 by Aadin Gaut, RN  Safety Interventions:   commode/urinal/bedpan at bedside   fall reduction program maintained   family at bedside   environmental modification   lighting adjusted for tasks/safety   low bed   nonskid shoes/slippers when out of bed  Goal: Optimal Comfort and Wellbeing  Outcome: Ongoing - Unchanged  Goal: Readiness for Transition of Care  Outcome: Ongoing - Unchanged  Goal: Rounds/Family Conference  Outcome: Ongoing - Unchanged     Problem: Infection  Goal: Absence of Infection Signs and Symptoms  Outcome: Ongoing - Unchanged     Problem: Wound  Goal: Optimal Coping  Outcome: Ongoing - Unchanged  Goal: Optimal Functional Ability  Outcome: Ongoing - Unchanged  Goal: Absence of Infection Signs and Symptoms  Outcome: Ongoing - Unchanged  Goal: Improved Oral Intake  Outcome: Ongoing - Unchanged  Goal: Optimal Pain Control and Function  Outcome: Ongoing - Unchanged  Goal: Skin Health and Integrity  Outcome: Ongoing - Unchanged  Intervention: Optimize Skin Protection  Recent Flowsheet Documentation  Taken 06/08/2023 2103 by Ledonna Dormer, RN  Head of Bed (HOB) Positioning: HOB elevated  Goal: Optimal Wound Healing  Outcome: Ongoing - Unchanged     Problem: Self-Care Deficit  Goal: Improved Ability to Complete Activities of Daily Living  Outcome: Ongoing - Unchanged     Problem: Fatigue  Goal: Improved Activity Tolerance  Outcome: Ongoing - Unchanged     Problem: Fall Injury Risk  Goal: Absence of Fall and Fall-Related Injury  Outcome: Ongoing - Unchanged  Intervention: Promote Injury-Free Environment  Recent Flowsheet Documentation  Taken 06/08/2023 2103 by [employer], Danylle Ouk, RN  Safety Interventions:   commode/urinal/bedpan at bedside   fall reduction program maintained   family at bedside   environmental modification   lighting adjusted for tasks/safety   low bed   nonskid shoes/slippers when out of bed     Problem: Skin Injury Risk Increased  Goal: Skin Health and Integrity  Outcome: Ongoing - Unchanged  Intervention: Optimize Skin Protection  Recent Flowsheet Documentation  Taken 06/08/2023 2103 by Briaunna Grindstaff, RN  Head of Bed [facility]) Positioning: HOB elevated     Problem: Acute Kidney Injury/Impairment  Goal: Fluid and Electrolyte Balance  Outcome: Ongoing - Unchanged  Goal: Improved Oral Intake  Outcome: Ongoing - Unchanged  Goal: Effective Renal Function  Outcome: Ongoing - Unchanged     Problem: Peritoneal Dialysis  Goal: Optimize Fluid and Electrolyte Balance  Outcome: Ongoing - Unchanged  Goal: Absence of Infection Signs and Symptoms  Outcome: Ongoing - Unchanged  Goal: Safe, Effective Therapy Delivery  Outcome: Ongoing - Unchanged

## 2023-06-09 NOTE — Unmapped (Signed)
ADULT PERITONEAL DIALYSIS TREATMENT NOTE    PROCEDURE DATE/TIME:    06/09/23 1:16 AM PD THERAPY DAY:  5 PD DEVICE:   ([id number])     THERAPY TYPE:  Continuous Cycling Peritoneal Dialysis - Standard     CONSENT:    Written consent was obtained prior to the procedure and is detailed in the medical record. Prior to the start of the procedure, a time out was taken and the identity of the patient was confirmed via name, medical record number and date of birth.    Active Dialysis Orders (168h ago, onward)       Start     Ordered    06/09/23 0600  Peritoneal Dialysis - CCPD Standard  Daily      Question Answer Comment   Therapy Time (hours) Other (please specify) 11   Total Number of Cycles 4    Exchange Volume (L) 2L    Day dwell/Last fill (mL) 1000    Dialysate last fill with bag as ordered        06/08/23 1533                    VITAL SIGNS:  Vitals:    06/09/23 0049   BP:    Pulse: 99   Resp:    Temp:    SpO2:     Vitals:    06/07/23 0537 06/08/23 0813   Weight: 82.1 kg (180 lb 14.6 oz) 89.2 kg (196 lb 10.4 oz)        LAB RESULTS:    Potassium   Date Value Ref Range Status   06/08/2023 3.0 (L) 3.5 - 5.1 mmol/L Final       DIALYSATE FLUID:  Dianeal Solution: Dextrose 1.5% Calcium 2.5 mEq/L   Additives:  None    ACCESS:  Peritoneal Dialysis Catheter Continuous cycling Left lower abdomen (Active)       Peritoneal Dialysis Catheter Left lower abdomen (Active)   Site Assessment Clean;Dry;Intact 06/09/23 0007   Dressing Occlusive 06/09/23 0007   Dressing Status      Clean;Dry;Intact/not removed 06/09/23 0007   Dressing Change Due 06/11/23 06/09/23 0007   Dressing Intervention No intervention needed 06/09/23 0007   Status Accessed 06/09/23 0007   PD Catheter Transfer Set [employer] Connector 06/09/23 0007     Patient Lines/Drains/Airways Status       Active Peripheral & Central Intravenous Access       Name Placement date Placement time Site Days    Peripheral IV 06/03/23 Anterior;Right Forearm 06/03/23  2105  Forearm  5 Peripheral IV 06/04/23 Anterior;Right;Upper Arm 06/04/23  0250  Arm  4                    SETTINGS:  Mode: Standard Minimum Drain Volume (%): 85%   Smart Dwells: Yes Heater Bag Empty: No   Tidal Full Drains: No Flush: Yes   Program Locked: No I-Drain Alarm (mL): 0 mL   Program Verfied: Yes     Lines Unclamped:  Yes.      INITIAL DRAIN:    Inital Outflow Effluent Appearance: Clear Initial Drain Volume (mL): 1055 mL     THERAPY DETAILS:  Peritoneal Dialysis Fill Volume (ml): 2000 ml Peritoneal Dialysis Total Volume (ml): 9000 ml   Average Dwell Time (Minutes): 153 Minutes (Lost dwell of 19 minutes) Effluent Appearance: Clear     EXCHANGE NET BALANCE:    PD Net Exchange Output (mL): 755 ml  DIALYSIS ON-CALL NURSE PAGER NUMBER:  Monday thru Friday 0700 - 1730: Call the Dialysis Unit ext. [phone]   After 1730 and all day Sunday: Call the Dialysis RN Pager Number [phone]     PROCEDURE REVIEW, VERIFICATION, HANDOFF:  PD settings verified, procedure reviewed, and instructions given to primary RN.  Dialysis RN Verifying: Nicholes Rough RN Primary PD RN Verifying: R.Melosantos RN

## 2023-06-09 NOTE — Unmapped (Signed)
Nephrology (MEDB) Progress Note    Assessment & Plan:   Megan Robertson is a 52 y.o. 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     Principal Problem:    Hypotension  Active Problems:    GERD (gastroesophageal reflux disease)    OSA (obstructive sleep apnea)    Leukocytosis    Hypokalemia    Anemia in chronic kidney disease    ESRD (end stage renal disease) on dialysis (CMS-HCC)    Nausea    QT prolongation    Hyperphosphatemia    Constipation    Active Problems    Hypotension - improving  Leukocytosis - improving  Poor PO intake - improving  Reported a few days of diarrhea, poor PO intake prior to admission. Questionable PD output. Suspect hypotension on presentation due to overall hypovolemia, repleted with NS 250 mL bolus x2. Infectious etiology at this time unlikely given PD fluid having low cell count, fluid cx NGTD, blood cx NGTD x5 days, urine cx noted mixed flora, RPP negative. Repeat fluid cell count unremarkable. BP remarkable at 99/58 on 8/15. Orthostatics obtained 8/14 unremarkable for orthostatic hypotension. 8 AM morning cortisol unremarkable.  Patient endorses vaginal discharge for improved. BV negative. Candida pending. Patient noted to consume 100% of her dinner last night.  - Calorie count to assess nutrition  - Continued encouragement of fluid intake  - Daily PD  - Continue Eucerin    Diarrhea - Resolved, Now with Constipation  Patient reported diarrhea prior to admission, now having lack of BM. Started on bowel regimen yesterday given XR abdomen noting large stool burden along colon. Continues to have lack of BM at this time.  - Continue Miralax TID  - Continue Senna BID  - SMOG enema    Shortness of Breath - resolved  Patient breathing comfortably on room air. Pericardial effusion trivial.  - continuous O2 sat  - PT/OT to evaluate patient    ESRD 2/2 FSGS on PD  As above, seems to have been continuing with aggressive fluid removal at home despite poor PO intake and diarrhea, likely contributing to her hypotension on presentation.  - PD daily    Chronic Conditions  Hyperphosphatemia - Secondary hyperparathyroidism  - Continue calcitrol  - Daily phosphorous level  - Continue sevelamer 1600 mg 3 times daily     HTN: holding amlodipine and Imdur in setting of hypotension  Gout: holding home allopurinol (patient states it was recently discontinued)  Asthma: continue maintenance ICS, PRN albuterol (could consider simplification to ICS/SABA combo as outpatient)  Chronic pain: No recent fills of low-dose naltrexone, so not continuing. Also has not recently been taking Lyrica    Daily Checklist:  Diet: Regular Diet  DVT PPx: [name]  Electrolytes: Replete Potassium to >/= 3.6 and Magnesium to >/= 1.8  Code Status: Full Code  Dispo: Continue floor care     Team Contact Information:   Primary Team: Nephrology (MEDB)  Primary Resident: Bard Herbert, MD  Resident's Pager: [phone] (Nephrology Intern - Tower)    Interval History:   No acute events reported overnight per nursing staff. Potassium repleted. Nursing staff reports BM but patient denies BM since admission. Upon chart review, no record of BM noted.  Patient seen today resting comfortably in bed in no acute distress. Patient endorses feeling like herself again and is much less somnolent than prior visits. She denies any BM since admission. She endorses efficacy of xeroderma with Eucerin cream.   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 Small abrasion noted to labia majora, no significant bleeding noted. Abrasion present to lower back with surrounding xeroderma.     Bard Herbert, MD  Internal Medicine, PGY-1    Portions of this record have been created using Dragon dictation software. Dictation errors have been sought, but may not have been identified and corrected.

## 2023-06-10 LAB — BASIC METABOLIC PANEL
ANION GAP: 11 mmol/L (ref 5–14)
BLOOD UREA NITROGEN: 48 mg/dL — ABNORMAL HIGH (ref 9–23)
BUN / CREAT RATIO: 5
CALCIUM: 7.9 mg/dL — ABNORMAL LOW (ref 8.7–10.4)
CHLORIDE: 107 mmol/L (ref 98–107)
CO2: 25 mmol/L (ref 20.0–31.0)
CREATININE: 9.39 mg/dL — ABNORMAL HIGH
EGFR CKD-EPI (2021) FEMALE: 5 mL/min/{1.73_m2} — ABNORMAL LOW (ref >=60)
GLUCOSE RANDOM: 87 mg/dL (ref 70–179)
POTASSIUM: 3.4 mmol/L (ref 3.4–4.8)
SODIUM: 143 mmol/L (ref 135–145)

## 2023-06-10 LAB — CBC W/ AUTO DIFF
BASOPHILS ABSOLUTE COUNT: 0 10*9/L (ref 0.0–0.1)
BASOPHILS RELATIVE PERCENT: 0.3 %
EOSINOPHILS ABSOLUTE COUNT: 0.5 10*9/L (ref 0.0–0.5)
EOSINOPHILS RELATIVE PERCENT: 4.4 %
HEMATOCRIT: 27.1 % — ABNORMAL LOW (ref 34.0–44.0)
HEMOGLOBIN: 8.7 g/dL — ABNORMAL LOW (ref 11.3–14.9)
LYMPHOCYTES ABSOLUTE COUNT: 2 10*9/L (ref 1.1–3.6)
LYMPHOCYTES RELATIVE PERCENT: 17.7 %
MEAN CORPUSCULAR HEMOGLOBIN CONC: 32.1 g/dL (ref 32.0–36.0)
MEAN CORPUSCULAR HEMOGLOBIN: 29 pg (ref 25.9–32.4)
MEAN CORPUSCULAR VOLUME: 90.4 fL (ref 77.6–95.7)
MEAN PLATELET VOLUME: 7.1 fL (ref 6.8–10.7)
MONOCYTES ABSOLUTE COUNT: 0.7 10*9/L (ref 0.3–0.8)
MONOCYTES RELATIVE PERCENT: 5.9 %
NEUTROPHILS ABSOLUTE COUNT: 8.3 10*9/L — ABNORMAL HIGH (ref 1.8–7.8)
NEUTROPHILS RELATIVE PERCENT: 71.7 %
PLATELET COUNT: 307 10*9/L (ref 150–450)
RED BLOOD CELL COUNT: 2.99 10*12/L — ABNORMAL LOW (ref 3.95–5.13)
RED CELL DISTRIBUTION WIDTH: 13.8 % (ref 12.2–15.2)
WBC ADJUSTED: 11.6 10*9/L — ABNORMAL HIGH (ref 3.6–11.2)

## 2023-06-10 LAB — PHOSPHORUS: PHOSPHORUS: 7 mg/dL — ABNORMAL HIGH (ref 2.4–5.1)

## 2023-06-10 LAB — MAGNESIUM: MAGNESIUM: 1.6 mg/dL (ref 1.6–2.6)

## 2023-06-10 MED ORDER — POLYETHYLENE GLYCOL 3350 17 GRAM ORAL POWDER PACKET
PACK | Freq: Every day | ORAL | 2 refills | 30 days | Status: CP
Start: 2023-06-10 — End: 2023-09-08

## 2023-06-10 MED ORDER — SENNOSIDES 8.6 MG-DOCUSATE SODIUM 50 MG TABLET
ORAL_TABLET | Freq: Every day | ORAL | 2 refills | 30 days | Status: CP
Start: 2023-06-10 — End: 2023-09-08

## 2023-06-10 MED ORDER — SEVELAMER CARBONATE 800 MG TABLET: mg | ORAL | 2 refills | 30 days | Status: CP

## 2023-06-10 MED ORDER — APIXABAN 2.5 MG TABLET
ORAL_TABLET | Freq: Two times a day (BID) | ORAL | 2 refills | 45.00000 days | Status: CP
Start: 2023-06-10 — End: ?

## 2023-06-10 MED ORDER — FUROSEMIDE 40 MG TABLET: 0 refills | 0 days | Status: CP

## 2023-06-10 MED ORDER — NYSTATIN 100,000 UNIT/GRAM TOPICAL POWDER
Freq: Two times a day (BID) | TOPICAL | 0 refills | 0.00000 days | Status: CP
Start: 2023-06-10 — End: 2024-06-09

## 2023-06-10 MED ORDER — SENNOSIDES 8.6 MG TABLET
ORAL_TABLET | Freq: Every day | ORAL | 2 refills | 30 days | Status: CP
Start: 2023-06-10 — End: ?

## 2023-06-10 MED ORDER — MIDODRINE 5 MG TABLET
ORAL_TABLET | Freq: Two times a day (BID) | ORAL | 2 refills | 30.00000 days | Status: CP | PRN
Start: 2023-06-10 — End: 2023-09-08

## 2023-06-10 MED ORDER — CALCITRIOL 0.5 MCG CAPSULE
ORAL_CAPSULE | Freq: Every day | ORAL | 2 refills | 30 days | Status: CP
Start: 2023-06-10 — End: 2023-09-08

## 2023-06-10 MED ORDER — POLYETHYLENE GLYCOL 3350 17 GRAM/DOSE ORAL POWDER
Freq: Every day | ORAL | 0 refills | 30 days | Status: CP
Start: 2023-06-10 — End: 2023-07-10

## 2023-06-10 MED ADMIN — white petrolatum-mineral oil-lanolin topical cream: TOPICAL | @ 12:00:00 | Stop: 2023-06-11

## 2023-06-10 MED ADMIN — white petrolatum-mineral oil-lanolin topical cream: TOPICAL | @ 02:00:00

## 2023-06-10 MED ADMIN — aspirin chewable tablet 81 mg: 81 mg | ORAL | @ 12:00:00 | Stop: 2023-06-11

## 2023-06-10 MED ADMIN — white petrolatum-mineral oil-lanolin topical cream: TOPICAL | @ 17:00:00 | Stop: 2023-06-11

## 2023-06-10 MED ADMIN — atorvastatin (LIPITOR) tablet 80 mg: 80 mg | ORAL | @ 12:00:00 | Stop: 2023-06-11

## 2023-06-10 MED ADMIN — sevelamer (RENVELA) tablet 1,600 mg: 1600 mg | ORAL | @ 17:00:00 | Stop: 2023-06-11

## 2023-06-10 MED ADMIN — dianeal low CA - dextrose 1.5% and calcium 2.5 5,000 mL infusion: INTRAPERITONEAL

## 2023-06-10 MED ADMIN — senna (SENOKOT) tablet 2 tablet: 2 | ORAL | @ 12:00:00 | Stop: 2023-06-11

## 2023-06-10 MED ADMIN — polyethylene glycol (MIRALAX) packet 17 g: 17 g | ORAL | @ 02:00:00

## 2023-06-10 MED ADMIN — polyethylene glycol (MIRALAX) packet 17 g: 17 g | ORAL | @ 12:00:00 | Stop: 2023-06-11

## 2023-06-10 MED ADMIN — potassium chloride ER tablet 40 mEq: 40 meq | ORAL | @ 02:00:00 | Stop: 2023-06-09

## 2023-06-10 MED ADMIN — senna (SENOKOT) tablet 2 tablet: 2 | ORAL | @ 02:00:00

## 2023-06-10 MED ADMIN — calcitriol (ROCALTROL) capsule 0.5 mcg: .5 ug | ORAL | @ 12:00:00 | Stop: 2023-06-11

## 2023-06-10 MED ADMIN — sevelamer (RENVELA) tablet 1,600 mg: 1600 mg | ORAL | @ 12:00:00 | Stop: 2023-06-11

## 2023-06-10 MED ADMIN — polyethylene glycol (MIRALAX) packet 17 g: 17 g | ORAL | @ 17:00:00 | Stop: 2023-06-11

## 2023-06-10 MED ADMIN — fluticasone furoate (ARNUITY ELLIPTA) 100 mcg/actuation inhaler 1 puff: 1 | RESPIRATORY_TRACT | @ 12:00:00 | Stop: 2023-06-11

## 2023-06-10 MED ADMIN — apixaban (ELIQUIS) tablet 2.5 mg: 2.5 mg | ORAL | @ 12:00:00 | Stop: 2023-06-11

## 2023-06-10 MED ADMIN — apixaban (ELIQUIS) tablet 2.5 mg: 2.5 mg | ORAL | @ 02:00:00

## 2023-06-10 NOTE — Unmapped (Signed)
Alert and oriented.  On PD treatment. Complaining constipation given SMOG enema with positive results. Seated in the chair for meals and watching TV  Problem: Adult Inpatient Plan of Care  Goal: Plan of Care Review  Outcome: Ongoing - Unchanged  Goal: Patient-Specific Goal (Individualized)  Outcome: Ongoing - Unchanged  Goal: Absence of Hospital-Acquired Illness or Injury  Outcome: Ongoing - Unchanged  Intervention: Identify and Manage Fall Risk  Recent Flowsheet Documentation  Taken 06/09/2023 0800 by Curlene Labrum, RN  Safety Interventions:   fall reduction program maintained   low bed  Intervention: Prevent Skin Injury  Recent Flowsheet Documentation  Taken 06/09/2023 0800 by Curlene Labrum, RN  Device Skin Pressure Protection:   absorbent pad utilized/changed   adhesive use limited  Skin Protection:   adhesive use limited   cleansing with dimethicone incontinence wipes  Intervention: Prevent and Manage VTE (Venous Thromboembolism) Risk  Recent Flowsheet Documentation  Taken 06/09/2023 0800 by Curlene Labrum, RN  VTE Prevention/Management: ambulation promoted  Goal: Optimal Comfort and Wellbeing  Outcome: Ongoing - Unchanged  Goal: Readiness for Transition of Care  Outcome: Ongoing - Unchanged  Goal: Rounds/Family Conference  Outcome: Ongoing - Unchanged     Problem: Fall Injury Risk  Goal: Absence of Fall and Fall-Related Injury  Outcome: Ongoing - Unchanged  Intervention: Promote Injury-Free Environment  Recent Flowsheet Documentation  Taken 06/09/2023 0800 by Curlene Labrum, RN  Safety Interventions:   fall reduction program maintained   low bed     Problem: Acute Kidney Injury/Impairment  Goal: Fluid and Electrolyte Balance  Outcome: Ongoing - Unchanged  Goal: Improved Oral Intake  Outcome: Ongoing - Unchanged  Goal: Effective Renal Function  Outcome: Ongoing - Unchanged     Problem: Peritoneal Dialysis  Goal: Optimize Fluid and Electrolyte Balance  Outcome: Ongoing - Unchanged  Goal: Absence of Infection Signs and Symptoms  Outcome: Ongoing - Unchanged  Goal: Safe, Effective Therapy Delivery  Outcome: Ongoing - Unchanged

## 2023-06-10 NOTE — Unmapped (Signed)
Patient AO x 4, calm and cooperative, VSS, no complaints of pain throughout the shift.  Needs assistance with mobility, free from fall.  PIV line clean and intact, PD catheter in place.  Call bell in reach, bed in low and locked position.    Problem: Adult Inpatient Plan of Care  Goal: Plan of Care Review  Outcome: Progressing  Goal: Patient-Specific Goal (Individualized)  Outcome: Progressing  Goal: Absence of Hospital-Acquired Illness or Injury  Outcome: Progressing  Intervention: Identify and Manage Fall Risk  Recent Flowsheet Documentation  Taken 06/10/2023 0800 by Jacobo Forest, RN  Safety Interventions:   fall reduction program maintained   low bed  Goal: Optimal Comfort and Wellbeing  Outcome: Progressing  Goal: Readiness for Transition of Care  Outcome: Progressing  Goal: Rounds/Family Conference  Outcome: Progressing     Problem: Infection  Goal: Absence of Infection Signs and Symptoms  Outcome: Progressing     Problem: Wound  Goal: Optimal Coping  Outcome: Progressing  Goal: Optimal Functional Ability  Outcome: Progressing  Goal: Absence of Infection Signs and Symptoms  Outcome: Progressing  Goal: Improved Oral Intake  Outcome: Progressing  Goal: Optimal Pain Control and Function  Outcome: Progressing  Goal: Skin Health and Integrity  Outcome: Progressing  Goal: Optimal Wound Healing  Outcome: Progressing     Problem: Self-Care Deficit  Goal: Improved Ability to Complete Activities of Daily Living  Outcome: Progressing     Problem: Fatigue  Goal: Improved Activity Tolerance  Outcome: Progressing     Problem: Fall Injury Risk  Goal: Absence of Fall and Fall-Related Injury  Outcome: Progressing  Intervention: Promote Injury-Free Environment  Recent Flowsheet Documentation  Taken 06/10/2023 0800 by Jacobo Forest, RN  Safety Interventions:   fall reduction program maintained   low bed     Problem: Skin Injury Risk Increased  Goal: Skin Health and Integrity  Outcome: Progressing     Problem: Acute Kidney Injury/Impairment  Goal: Fluid and Electrolyte Balance  Outcome: Progressing  Goal: Improved Oral Intake  Outcome: Progressing  Goal: Effective Renal Function  Outcome: Progressing     Problem: Peritoneal Dialysis  Goal: Optimize Fluid and Electrolyte Balance  Outcome: Progressing  Goal: Absence of Infection Signs and Symptoms  Outcome: Progressing  Goal: Safe, Effective Therapy Delivery  Outcome: Progressing

## 2023-06-10 NOTE — Unmapped (Signed)
Pt currently resting quietly in bed, eyes closed, no ASD noted. Pt A&O, VSS, denies pain. PD cycling overnight, scheduled to end around 7am. No BM throughout the shift, scheduled bowel meds provided. Pt ate 100% of her dinner, calorie count updated. Telemetry monitoring in place. Pt was free from falls or injury this shift. Bed in low and locked position. Pt expresses no other needs at this time. Call bell and tray table within reach.       Problem: Adult Inpatient Plan of Care  Goal: Plan of Care Review  Outcome: Ongoing - Unchanged  Goal: Patient-Specific Goal (Individualized)  Outcome: Ongoing - Unchanged  Goal: Absence of Hospital-Acquired Illness or Injury  Outcome: Ongoing - Unchanged  Intervention: Identify and Manage Fall Risk  Recent Flowsheet Documentation  Taken 06/09/2023 2000 by Jolayne Haines, RN  Safety Interventions:   low bed   lighting adjusted for tasks/safety   fall reduction program maintained   nonskid shoes/slippers when out of bed  Intervention: Prevent Skin Injury  Recent Flowsheet Documentation  Taken 06/09/2023 2000 by Jolayne Haines, RN  Device Skin Pressure Protection: absorbent pad utilized/changed  Skin Protection: adhesive use limited  Goal: Optimal Comfort and Wellbeing  Outcome: Ongoing - Unchanged  Goal: Readiness for Transition of Care  Outcome: Ongoing - Unchanged  Goal: Rounds/Family Conference  Outcome: Ongoing - Unchanged     Problem: Infection  Goal: Absence of Infection Signs and Symptoms  Outcome: Ongoing - Unchanged     Problem: Wound  Goal: Optimal Coping  Outcome: Ongoing - Unchanged  Goal: Optimal Functional Ability  Outcome: Ongoing - Unchanged  Goal: Absence of Infection Signs and Symptoms  Outcome: Ongoing - Unchanged  Goal: Improved Oral Intake  Outcome: Ongoing - Unchanged  Goal: Optimal Pain Control and Function  Outcome: Ongoing - Unchanged  Goal: Skin Health and Integrity  Outcome: Ongoing - Unchanged  Intervention: Optimize Skin Protection  Recent Flowsheet Documentation  Taken 06/09/2023 2000 by Jolayne Haines, RN  Head of Bed [facility]) Positioning: HOB elevated  Pressure Reduction Devices:   alternating pressure pump (APP)   pressure-redistributing mattress utilized  Skin Protection: adhesive use limited  Goal: Optimal Wound Healing  Outcome: Ongoing - Unchanged     Problem: Self-Care Deficit  Goal: Improved Ability to Complete Activities of Daily Living  Outcome: Ongoing - Unchanged     Problem: Fatigue  Goal: Improved Activity Tolerance  Outcome: Ongoing - Unchanged     Problem: Fall Injury Risk  Goal: Absence of Fall and Fall-Related Injury  Outcome: Ongoing - Unchanged  Intervention: Promote Injury-Free Environment  Recent Flowsheet Documentation  Taken 06/09/2023 2000 by Jolayne Haines, RN  Safety Interventions:   low bed   lighting adjusted for tasks/safety   fall reduction program maintained   nonskid shoes/slippers when out of bed     Problem: Skin Injury Risk Increased  Goal: Skin Health and Integrity  Outcome: Ongoing - Unchanged  Intervention: Optimize Skin Protection  Recent Flowsheet Documentation  Taken 06/09/2023 2000 by Jolayne Haines, RN  Head of Bed [facility]) Positioning: HOB elevated  Pressure Reduction Devices:   alternating pressure pump (APP)   pressure-redistributing mattress utilized  Skin Protection: adhesive use limited     Problem: Acute Kidney Injury/Impairment  Goal: Fluid and Electrolyte Balance  Outcome: Ongoing - Unchanged  Goal: Improved Oral Intake  Outcome: Ongoing - Unchanged  Goal: Effective Renal Function  Outcome: Ongoing - Unchanged     Problem: Peritoneal Dialysis  Goal: Optimize  Fluid and Electrolyte Balance  Outcome: Ongoing - Unchanged  Goal: Absence of Infection Signs and Symptoms  Outcome: Ongoing - Unchanged  Goal: Safe, Effective Therapy Delivery  Outcome: Ongoing - Unchanged

## 2023-06-10 NOTE — Unmapped (Signed)
Physician Discharge Summary [facility]  [address] [facility]  [address]  [place] [address]  Dept: [phone]  Loc: [phone]     Identifying Information:   Megan Robertson  Feb 13, 1971  [id number]    Primary Care Physician: Rudie Meyer, MD     Code Status: Full Code    Admit Date: 06/03/2023    Discharge Date: 06/10/2023     Discharge To: Home    Discharge Service: [facility] - Nephrology Floor Team (MED B - Tower)     Discharge Attending Physician: Bryna Colander, MD    Discharge Diagnoses:     Principal Problem:    Hypotension (POA: Yes)  Active Problems:    GERD (gastroesophageal reflux disease) (POA: Yes)    OSA (obstructive sleep apnea) (POA: Yes)    Leukocytosis (POA: Yes)    Hypokalemia (POA: Yes)    Anemia in chronic kidney disease (POA: Yes)    ESRD (end stage renal disease) on dialysis (CMS-HCC) (POA: Not Applicable)    Nausea (POA: Yes)    QT prolongation (POA: Yes)    Hyperphosphatemia (POA: Yes)    Constipation (POA: Yes)  Resolved Problems:    * No resolved hospital problems. *    Outpatient Provider Follow Up Issues:   [ ]  PCP: Consider simplification of asthma meds to ICS/LABA  [ ]  Nephrology: Started patient on midodrine 5 mg BID. Consider adjusting as necessary.    Hospital Course:   Megan Robertson is a 52 y.o. 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     Hypotension - improving  Leukocytosis - downtrending  Poor PO intake - resolved  Patient noted to have diarrhea times several days with associated poor p.o. intake prior to admission.  Hypertension was likely secondary to hypovolemia iso decreased intake/diarrhea. Infectious workup negative given PD fluid having low cell count, fluid cx NGTD, blood cx NGTD x5 days, urine cx noted mixed flora, RPP negative. Repeat fluid cell count unremarkable. 8 AM morning cortisol unremarkable.  Her pressures were soft during earlier portion of admission, improved throughout admission and stable at discharge with SBP's in the low 100s with MAPs in the high 70s.  Her oral intake improved during admission, a calorie count was implemented which noted patient was consuming 100% of her meals for the last 48 hours prior to discharge.     Overflow Diarrhea iso Constipation - Resolved, Now with Constipation  Patient reported diarrhea prior to admission, likely iso constipation.  XR abdomen noted large stool burden along colon.  She was started on aggressive bowel regimen with BM noted s/p smog enema.  She was discharged on bowel regimen to prevent constipation given dependence on PD.     Shortness of Breath - resolved  Patient breathing comfortably on room air. Pericardial effusion trivial.     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     Chronic Conditions  Hyperphosphatemia - Secondary hyperparathyroidism: Patient was continued on home calcitriol 0.5 mcg daily and sevelamer 1600 mg 3 times daily.    HTN: Held amlodipine and Imdur in setting of hypotension  Gout:  Held home allopurinol (patient states it was recently discontinued)  Asthma: Continued maintenance ICS, PRN albuterol   Chronic pain: No recent fills of low-dose naltrexone, so not continued. Also had not recently been taking Lyrica  ______________________________________________________________________  Discharge Medications:      Your Medication List        STOP taking these medications      allopurinol 100 MG tablet  Commonly known as: ZYLOPRIM     amlodipine 10 MG tablet  Commonly known as: NORVASC     isosorbide mononitrate 120 MG 24 hr tablet  Commonly known as: IMDUR     naltrexone 4.5 mg capsule     NON FORMULARY     pregabalin 50 MG capsule  Commonly known as: LYRICA     REPATHA SURECLICK 140 mg/mL Pnij  Generic drug: evolocumab            START taking these medications      midodrine 5 MG tablet  Commonly known as: PROAMATINE  Take 1 tablet (5 mg total) by mouth two (2) times a day as needed (Take 1 tab after PD. Please check your BP each afternoon, take 1 tab for systolic pressure less than 90).     nystatin 100,000 unit/gram powder  Commonly known as: MYCOSTATIN  Apply topically two (2) times a day. Apply under breasts 2 times daily until resolution     polyethylene glycol 17 gram packet  Commonly known as: MIRALAX  Take 17 g by mouth daily.     senna 8.6 mg tablet  Commonly known as: SENOKOT  Take 2 tablets by mouth daily.            CHANGE how you take these medications      apixaban 2.5 mg Tab  Commonly known as: ELIQUIS  Take 1 tablet (2.5 mg total) by mouth two (2) times a day.  What changed:   medication strength  how much to take  when to take this     calcitriol 0.5 MCG capsule  Commonly known as: ROCALTROL  Take 1 capsule (0.5 mcg total) by mouth daily.  Start taking on: June 11, 2023  What changed: how much to take     furosemide 40 MG tablet  Commonly known as: LASIX  To be taken up to 3 times weekly as needed if you develop leg swelling.  What changed:   how much to take  how to take this  when to take this  additional instructions     sevelamer 800 mg tablet  Commonly known as: RENVELA  Take 2 tablets (1,600 mg total) by mouth Three (3) times a day with a meal.  What changed: how much to take            CONTINUE taking these medications      albuterol 90 mcg/actuation inhaler  Commonly known as: PROVENTIL HFA;VENTOLIN HFA  Inhale 2 puffs every six (6) hours as needed for wheezing.     aspirin 81 MG chewable tablet  Chew 1 tablet (81 mg total) daily.     atorvastatin 80 MG tablet  Commonly known as: LIPITOR  Take 1 tablet (80 mg total) by mouth daily.     COSENTYX PEN (2 PENS) 150 mg/mL Pnij injection  Generic drug: secukinumab  Inject the contents of 2 pens (300 mg total) under the skin every twenty-eight (28) days. Maintenance dose.     fluocinolone 0.01 % external oil  Apply topically daily as needed. Use for  psoriasis and scalp scaling.     ondansetron 4 MG tablet  Commonly known as: ZOFRAN  Take 1 tablet (4 mg total) by mouth every six (6) hours for 3 days.     QVAR REDIHALER 80 mcg/actuation inhaler  Generic drug: beclomethasone dipropionate  Inhale 1 puff  in the morning and 1 puff in the evening.     tizanidine 2 MG tablet  Commonly known as: ZANAFLEX  Take 1 tablet (2 mg total) by mouth daily as needed.     triamcinolone 0.1 % ointment  Commonly known as: KENALOG  Apply topically two (2) times a day as needed. Use on psoriasis until flat.              Allergies:  Cephalexin, Mango, Peach (prunus persica), Wellbutrin [bupropion hcl], Egg, Fish containing products, Lactase-rennet, Lexiscan [regadenoson], and Mushroom  ______________________________________________________________________  Pending Test Results:  Pending Labs       Order Current Status    Yeast Screen In process            Most Recent Labs:  All lab results last 24 hours -   Recent Results (from the past 24 hour(s))   Magnesium Level    Collection Time: 06/09/23  8:18 PM   Result Value Ref Range    Magnesium 1.8 1.6 - 2.6 mg/dL   Basic Metabolic Panel    Collection Time: 06/09/23  8:18 PM   Result Value Ref Range    Sodium 143 135 - 145 mmol/L    Potassium 3.5 3.4 - 4.8 mmol/L    Chloride 107 98 - 107 mmol/L    CO2 27.0 20.0 - 31.0 mmol/L    Anion Gap 9 5 - 14 mmol/L    BUN 58 (H) 9 - 23 mg/dL    Creatinine 3.66 (H) 0.55 - 1.02 mg/dL    BUN/Creatinine Ratio 6     eGFR CKD-EPI (2021) Female 4 (L) >=60 mL/min/1.56m2    Glucose 104 70 - 179 mg/dL    Calcium 8.0 (L) 8.7 - 10.4 mg/dL   Basic Metabolic Panel    Collection Time: 06/10/23  8:02 AM   Result Value Ref Range    Sodium 143 135 - 145 mmol/L    Potassium 3.4 3.4 - 4.8 mmol/L    Chloride 107 98 - 107 mmol/L    CO2 25.0 20.0 - 31.0 mmol/L    Anion Gap 11 5 - 14 mmol/L    BUN 48 (H) 9 - 23 mg/dL    Creatinine 4.40 (H) 0.55 - 1.02 mg/dL    BUN/Creatinine Ratio 5     eGFR CKD-EPI (2021) Female 5 (L) >=60 mL/min/1.19m2    Glucose 87 70 - 179 mg/dL    Calcium 7.9 (L) 8.7 - 10.4 mg/dL   Magnesium Level    Collection Time: 06/10/23  8:02 AM   Result Value Ref Range    Magnesium 1.6 1.6 - 2.6 mg/dL   Phosphorus Level    Collection Time: 06/10/23  8:02 AM   Result Value Ref Range    Phosphorus 7.0 (H) 2.4 - 5.1 mg/dL   CBC w/ Differential    Collection Time: 06/10/23  8:02 AM   Result Value Ref Range    WBC 11.6 (H) 3.6 - 11.2 10*9/L    RBC 2.99 (L) 3.95 - 5.13 10*12/L    HGB 8.7 (L) 11.3 - 14.9 g/dL    HCT 34.7 (L) 42.5 - 44.0 %    MCV 90.4 77.6 -  95.7 fL    MCH 29.0 25.9 - 32.4 pg    MCHC 32.1 32.0 - 36.0 g/dL    RDW 47.4 25.9 - 56.3 %    MPV 7.1 6.8 - 10.7 fL    Platelet 307 150 - 450 10*9/L    Neutrophils % 71.7 %    Lymphocytes % 17.7 %    Monocytes % 5.9 %    Eosinophils % 4.4 %    Basophils % 0.3 %    Absolute Neutrophils 8.3 (H) 1.8 - 7.8 10*9/L    Absolute Lymphocytes 2.0 1.1 - 3.6 10*9/L    Absolute Monocytes 0.7 0.3 - 0.8 10*9/L    Absolute Eosinophils 0.5 0.0 - 0.5 10*9/L    Absolute Basophils 0.0 0.0 - 0.1 10*9/L     Relevant Studies/Radiology:  XR Abdomen 1 View    Result Date: 06/08/2023  EXAM: XR ABDOMEN 1 VIEW ACCESSION: [id number] UN CLINICAL INDICATION: 52 years old with CONSTIPATION  COMPARISON: 06/23/2019 TECHNIQUE: Supine view of the abdomen, 2 image(s) FINDINGS: Lung bases are excluded from the field-of-view. Generalized paucity of small bowel gas limits evaluation. Moderate to large amount of stool along the colon. Right upper quadrant surgical clips,. Degenerative osseous changes.     Moderate to large amount of stool along the colon, suggests constipation.     XR Chest Portable    Result Date: 06/07/2023  EXAM: XR CHEST PORTABLE ACCESSION: [id number] UN CLINICAL INDICATION: END STAGE RENAL DISEASE  TECHNIQUE: Single View AP Chest Radiograph. COMPARISON: 06/03/2023 FINDINGS: Lungs are clear. No pleural effusion or pneumothorax. Normal heart size and mediastinal contours.     Negative chest.    ECG 12 Lead    Result Date: 06/05/2023  NORMAL SINUS RHYTHM JUNCTIONAL ST DEPRESSION, PROBABLY NORMAL PROLONGED QT ABNORMAL ECG WHEN COMPARED WITH ECG OF 15-Sep-2022 23:36, QT HAS LENGTHENED Confirmed by Mariane Baumgarten ([id number]) on 06/05/2023 12:13:25 PM    Echocardiogram W Colorflow Spectral Doppler    Result Date: 06/04/2023  Patient Info Name:     FARDOWSA SANDBOTHE Age:     52 years DOB:     [birth year] Gender:     Female MRN:     [id number] Accession #:     [id number] UN Account #:     [id number] Ht:     158 cm Wt:     80 kg BSA:     1.90 m2 BP:     90 /     48 mmHg HR:     86 bpm Exam Date:     06/04/2023 2:26 PM Admit Date:     06/03/2023 Exam Type:     ECHOCARDIOGRAM W COLORFLOW SPECTRAL DOPPLER Technical Quality:     Fair Staff Sonographer:     Margarita Grizzle Reading Fellow:     Damita Dunnings MD Study Info Indications      - pericardial effusion on POCUS,  assess RV and LV EF Procedure(s)   Complete two-dimensional, color flow and Doppler transthoracic echocardiogram is performed. Summary   1. The left ventricle is normal in size with normal wall thickness.   2. The left ventricular systolic function is normal, LVEF is visually estimated at > 55%.   3. The right ventricle is normal in size, with normal systolic function.   4. There is a trivial posterior pericardial effusion. Left Ventricle   The left ventricle is normal in size with normal wall thickness. The left ventricular systolic function is normal, LVEF is visually estimated at > 55%. There is  normal left ventricular diastolic function. Right Ventricle   The right ventricle is normal in size, with normal systolic function. Left Atrium   The left atrium is normal in size. Right Atrium   The right atrium is normal in size. Aortic Valve   The aortic valve is trileaflet with normal appearing leaflets with normal excursion. There is no significant aortic regurgitation. There is no evidence of a significant transvalvular gradient. Mitral Valve   The mitral valve leaflets are normal with normal leaflet mobility. There is no significant mitral valve regurgitation. Tricuspid Valve   The tricuspid valve leaflets are normal, with normal leaflet mobility. There is no significant tricuspid regurgitation. The pulmonary systolic pressure cannot be estimated due to insufficient TR signal. Pulmonic Valve   Pulmonary valve is not well visualized. There is trivial pulmonic regurgitation. There is no evidence of a significant transvalvular gradient. Aorta   The aorta is normal in size in the visualized segments. Inferior Vena Cava   IVC size and inspiratory change suggest normal right atrial pressure. (0-5 mmHg). Pericardium/Pleural   There is a trivial posterior pericardial effusion. Pericardial fat pad present. Other Findings   Rhythm: Sinus Rhythm. Ventricles ---------------------------------------------------------------------- Name                                 Value        Normal ---------------------------------------------------------------------- LV Dimensions 2D/MM ----------------------------------------------------------------------  IVS Diastolic Thickness (2D)                                0.7 cm       0.6-0.9 LVID Diastole (2D)                  3.9 cm       3.8-5.2  LVPW Diastolic Thickness (2D)                                0.9 cm       0.6-0.9 LVID Systole (2D)                   2.6 cm       2.2-3.5 LV Mass Index (2D Cubed)           47 g/m2         43-95  Relative Wall Thickness (2D)                                  0.46               RV Dimensions 2D/MM ----------------------------------------------------------------------  RV Basal Diastolic Dimension                           3.2 cm       2.5-4.1 TAPSE 1.7 cm         >=1.7 Atria ---------------------------------------------------------------------- Name                                 Value        Normal ---------------------------------------------------------------------- LA Dimensions ---------------------------------------------------------------------- LA Dimension (2D)  3.1 cm       2.7-3.8 LA Volume Index (2C A-L)        19.98 ml/m2               RA Dimensions ---------------------------------------------------------------------- RA Area (4C)                      10.0 cm2        <=18.0 RA Area (4C) Index              5.2 cm2/m2               RA ESV Index (4C MOD)             12 ml/m2         15-27 Aortic Valve ---------------------------------------------------------------------- Name                                 Value        Normal ---------------------------------------------------------------------- AV Doppler ---------------------------------------------------------------------- AV Peak Velocity                   1.8 m/s               AV Peak Gradient                   12 mmHg Mitral Valve ---------------------------------------------------------------------- Name                                 Value        Normal ---------------------------------------------------------------------- MV Diastolic Function ---------------------------------------------------------------------- MV E Peak Velocity                 92 cm/s               MV A Peak Velocity                 98 cm/s               MV E/A                                 0.9               MV Annular TDI ---------------------------------------------------------------------- MV Septal e' Velocity             9.0 cm/s         >=8.0 MV E/e' (Septal)                      10.2               MV Lateral e' Velocity            9.3 cm/s        >=10.0 MV E/e' (Lateral)                     10.0               MV e' Average                     9.1 cm/s               MV E/e' (Average) 10.1 Tricuspid Valve ---------------------------------------------------------------------- Name  Value        Normal ---------------------------------------------------------------------- Estimated PAP/RSVP ---------------------------------------------------------------------- RA Pressure                         3 mmHg           <=5 Pulmonic Valve ---------------------------------------------------------------------- Name                                 Value        Normal ---------------------------------------------------------------------- PV Doppler ---------------------------------------------------------------------- PV Peak Velocity                   1.2 m/s Aorta ---------------------------------------------------------------------- Name                                 Value        Normal ---------------------------------------------------------------------- Ascending Aorta ---------------------------------------------------------------------- Ao Root Diameter (2D)               1.9 cm               Ao Root Diam Index (2D)          1.0 cm/m2 Venous ---------------------------------------------------------------------- Name                                 Value        Normal ---------------------------------------------------------------------- IVC/SVC ---------------------------------------------------------------------- IVC Diameter (Exp 2D)               1.2 cm         <=2.1 Report Signatures Amended by Almira Coaster  MD on 06/04/2023 06:49 PM Finalized by Almira Coaster  MD on 06/04/2023 06:45 PM Preliminary amended by Damita Dunnings  MD on 06/04/2023 04:24 PM Preliminary amended by Damita Dunnings  MD on 06/04/2023 04:24 PM Resident Damita Dunnings  MD on 06/04/2023 04:22 PM    ED POCUS    Result Date: 06/03/2023  Limited Cardiac Ultrasound (CPT 16109-60) Indication: A focused ultrasound exam of the heart was performed to evaluate for pericardial effusion, tamponade, severe hypovolemia, or gross abnormalities of cardiac anatomy or function in this patient. The ultrasound was performed with the following indications, as noted in the H&P: Chest pain, Dyspnea, Hypotension, and Concern for effusion Identified structures: The pericardial sac, myocardium, and 4 chambers were identified using the following views: parasternal long axis, parasternal short axis, and apical 4-chamber Findings: Exam of the above structures revealed the following findings:  Pericardial effusion: Present  with a MODERATE effusion   Pericardial tamponade: N/A  Global LV function: Normal  Right ventricular size: Normal   Signs of RV strain: N/A  IVC: N/A  Other findings: none Limitations: None. Impression: Pericardial effusion:  with a MODERATE effusion Other: effusion present, small to moderate, no tamponade Interpreted by: Para March, MD    XR Chest Portable    Result Date: 06/03/2023  EXAM: XR CHEST PORTABLE ACCESSION: [id number] UN CLINICAL INDICATION: CHEST PAIN  TECHNIQUE: Single View AP Chest Radiograph. COMPARISON: Chest radiograph from 09/15/2022. FINDINGS: Surgical clips in the inferior left cervical space. Blunting of the left costophrenic angle, suggestive of small left pleural effusion. No pneumothorax. Normal heart size and mediastinal contours.     Blunting of the left costophrenic angle, suggestive of small left pleural effusion.  ______________________________________________________________________    Appointments which have been scheduled for you      Jun 26, 2023 1:00 PM  (Arrive by 12:30 PM)  RETURN VIDEO MYCHART with Roe Coombs, PhD  [facility] [address] [facility] REGION) [address]  [address]  [place] [address]  [phone]   Please sign into My [place] Chart at least 15 minutes before your appointment to complete the eCheck-In process. You must complete eCheck-In before you can start your video visit. We also recommend testing your audio and video connection to troubleshoot any issues before your visit begins. Click ???Join Video Visit??? to complete these checks. Once you have completed eCheck-In and tested your audio and video, click ???Join Call??? to connect to your visit.     For your video visit, you will need a computer with a working camera, speaker and microphone, a smartphone, or a tablet with internet access.    My [place] Chart enables you to manage your health, send non-urgent messages to your provider, view your test results, schedule and manage appointments, and request prescription refills securely and conveniently from your computer or mobile device.    You can go to [email or web address] to sign in to your My [place] Chart account with your username and password. If you have forgotten your username or password, please choose the ???Forgot Username???? and/or ???Forgot Password???? links to gain access. You also can access your My [place] Chart account with the free MyChart mobile app for Android or iPhone.    If you need assistance accessing your My [place] Chart account or for assistance in reaching your provider's office to reschedule or cancel your appointment, please call [facility] [phone].         Jul 10, 2023 2:30 PM  (Arrive by 2:15 PM)  ECHOCARDIOGRAM W COLORFLOW SPECTRAL DOPPLER with ET FL 2 ECHO RM 1  IMG ECHO [place] [facility] [facility] [place] [address]  [facility] 1 through 4  [place] [address]  [phone]        Nov 20, 2023 11:00 AM  (Arrive by 10:30 AM)  PVL ARTERIAL DUPLEX UPPER EXTREMITY LEFT with [facility] PVL OUTPATIENT 1  IMG PVL [facility]) [address]  [place] [address]  [phone]        Nov 20, 2023 12:15 PM  (Arrive by 11:45 AM)  RETURN  GENERAL with Earney Mallet, MD  [facility] VASCULAR SURGERY  [facility] REGION) [address]  [place] [address]  [phone] ______________________________________________________________________  Discharge Day Services:  BP 106/61  - Pulse 94  - Temp 36.6 ??C (97.9 ??F) (Oral)  - Resp 20  - Ht 157.5 cm (5' 2)  - Wt 89.2 kg (196 lb 10.4 oz)  - LMP  (LMP Unknown)  - SpO2 100%  - BMI 35.97 kg/m??     Pt seen on the day of discharge and determined appropriate for discharge.    Condition at Discharge: stable    Length of Discharge: I spent greater than 30 mins in the discharge of this patient.    Bard Herbert, MD  Internal Medicine, PGY-1    Portions of this record have been created using Dragon dictation software. Dictation errors have been sought, but may not have been identified and corrected.

## 2023-06-10 NOTE — Unmapped (Addendum)
Megan Robertson is a 52 y.o. 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     Hypotension - improving  Leukocytosis - downtrending  Poor PO intake - resolved  Patient noted to have diarrhea times several days with associated poor p.o. intake prior to admission.  Hypertension was likely secondary to hypovolemia iso decreased intake/diarrhea. Infectious workup negative given PD fluid having low cell count, fluid cx NGTD, blood cx NGTD x5 days, urine cx noted mixed flora, RPP negative. Repeat fluid cell count unremarkable. 8 AM morning cortisol unremarkable.  Her pressures were soft during earlier portion of admission, improved throughout admission and stable at discharge with SBP's in the low 100s with MAPs in the high 70s.  Her oral intake improved during admission, a calorie count was implemented which noted patient was consuming 100% of her meals for the last 48 hours prior to discharge. She was started on midodrine at discharge to improve pressures.     Overflow Diarrhea iso Constipation - Resolved, Now with Constipation  Patient reported diarrhea prior to admission, likely iso constipation.  XR abdomen noted large stool burden along colon.  She was started on aggressive bowel regimen with BM noted s/p smog enema.  She was discharged on bowel regimen to prevent constipation given dependence on PD.     Shortness of Breath - resolved  Patient breathing comfortably on room air. Pericardial effusion trivial.     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     Chronic Conditions  Hyperphosphatemia - Secondary hyperparathyroidism: Patient was continued on home calcitriol 0.5 mcg daily and sevelamer 1600 mg 3 times daily.    HTN: Held amlodipine and Imdur in setting of hypotension  Gout: Held home allopurinol (patient states it was recently discontinued)  Asthma: Continued maintenance ICS, PRN albuterol   Chronic pain: No recent fills of low-dose naltrexone, so not continued. Also had not recently been taking Lyrica

## 2023-06-10 NOTE — Unmapped (Signed)
ADULT PERITONEAL DIALYSIS TREATMENT NOTE    PROCEDURE DATE/TIME:    06/09/23 8:46 PM PD THERAPY DAY:  6 PD DEVICE:   (jelly)     THERAPY TYPE:  Continuous Cycling Peritoneal Dialysis - Standard     CONSENT:    Written consent was obtained prior to the procedure and is detailed in the medical record. Prior to the start of the procedure, a time out was taken and the identity of the patient was confirmed via name, medical record number and date of birth.    Active Dialysis Orders (168h ago, onward)       Start     Ordered    06/09/23 0600  Peritoneal Dialysis - CCPD Standard  Daily      Question Answer Comment   Therapy Time (hours) Other (please specify) 11   Total Number of Cycles 4    Exchange Volume (L) 2L    Day dwell/Last fill (mL) 1000    Dialysate last fill with bag as ordered        06/08/23 1533                    VITAL SIGNS:  Vitals:    06/09/23 2027   BP: 113/62   Pulse: 93   Resp: 18   Temp: 37 ??C (98.6 ??F)   SpO2:     Vitals:    06/07/23 0537 06/08/23 0813   Weight: 82.1 kg (180 lb 14.6 oz) 89.2 kg (196 lb 10.4 oz)        LAB RESULTS:    Potassium   Date Value Ref Range Status   06/09/2023 3.4 3.4 - 4.8 mmol/L Final       DIALYSATE FLUID:  Dianeal Solution: Dextrose 1.5% Calcium 2.5 mEq/L   Additives:  None    ACCESS:  Peritoneal Dialysis Catheter Continuous cycling Left lower abdomen (Active)       Peritoneal Dialysis Catheter Left lower abdomen (Active)   Site Assessment Clean;Intact 06/09/23 2032   Dressing Occlusive 06/09/23 2032   Dressing Status      Intact/not removed 06/09/23 2032   Dressing Change Due 06/11/23 06/09/23 2032   Dressing Intervention No intervention needed 06/09/23 2032   Status Accessed 06/09/23 2032   PD Catheter Transfer Set [employer] Connector 06/09/23 2032     Patient Lines/Drains/Airways Status       Active Peripheral & Central Intravenous Access       Name Placement date Placement time Site Days    Peripheral IV 06/03/23 Anterior;Right Forearm 06/03/23  2105  Forearm  5 Peripheral IV 06/04/23 Anterior;Right;Upper Arm 06/04/23  0250  Arm  5                    SETTINGS:  Mode: Standard Minimum Drain Volume (%): 85%   Smart Dwells: Yes Heater Bag Empty: No   Tidal Full Drains: Yes Flush: Yes   Program Locked: No I-Drain Alarm (mL): 0 mL   Program Verfied: Yes     Lines Unclamped:  Yes.      INITIAL DRAIN:    Inital Outflow Effluent Appearance: Amber, Clear Initial Drain Volume (mL): 1174 mL     THERAPY DETAILS:  Peritoneal Dialysis Fill Volume (ml): 2000 ml Peritoneal Dialysis Total Volume (ml): 9000 ml   Average Dwell Time (Minutes): 129 Minutes (lost dwell 39) Effluent Appearance: Clear     EXCHANGE NET BALANCE:    PD Net Exchange Output (mL): 182 ml  DIALYSIS ON-CALL NURSE PAGER NUMBER:  Monday thru Friday 0700 - 1730: Call the Dialysis Unit ext. [phone]   After 1730 and all day Sunday: Call the Dialysis RN Pager Number [phone]     PROCEDURE REVIEW, VERIFICATION, HANDOFF:  PD settings verified, procedure reviewed, and instructions given to primary RN.  Dialysis RN Verifying: Tia Alert Primary PD RN Verifying: Robley Fries RN

## 2023-06-11 MED ORDER — CALCITRIOL 0.5 MCG CAPSULE
ORAL_CAPSULE | Freq: Every day | ORAL | 2 refills | 30 days | Status: CP
Start: 2023-06-11 — End: ?

## 2023-06-11 NOTE — Unmapped (Signed)
Patient is currently in the hospital with hypertension.  She was discharged on 8/19 with orders for home health.  Frances Furbish is requesting the orders be signed by her PCP.     [phone] ask for Erskine Squibb

## 2023-06-13 DIAGNOSIS — R0789 Other chest pain: Principal | ICD-10-CM

## 2023-06-13 MED ORDER — QVAR REDIHALER 80 MCG/ACTUATION HFA BREATH ACTIVATED AEROSOL
Freq: Two times a day (BID) | RESPIRATORY_TRACT | 3 refills | 0 days | Status: CP
Start: 2023-06-13 — End: ?

## 2023-06-19 NOTE — Unmapped (Signed)
Patient Advice Request     Patient Name: Megan Robertson  Caller: Home Health  Contact Method: Telephone Call: Time- Any Time [phone]   Reason for Call: Wanted to know what type of hypertension is the patient diagnosed with to process a order.  Previously Discussed: no  Appointment Offered: No

## 2023-06-20 DIAGNOSIS — N3090 Cystitis, unspecified without hematuria: Principal | ICD-10-CM

## 2023-06-22 ENCOUNTER — Encounter: Admit: 2023-06-22 | Payer: MEDICARE

## 2023-06-22 ENCOUNTER — Ambulatory Visit
Admit: 2023-06-22 | Discharge: 2023-06-29 | Disposition: A | Discharge status: Home Health | Payer: MEDICARE | Admitting: Nephrology

## 2023-06-22 ENCOUNTER — Encounter
Admit: 2023-06-22 | Discharge: 2023-06-29 | Disposition: A | Discharge status: Home Health | Payer: MEDICARE | Attending: Internal Medicine | Admitting: Nephrology

## 2023-06-22 LAB — GREEN LITHIUM HEPARIN EXTRA TUBE

## 2023-06-22 LAB — COMPREHENSIVE METABOLIC PANEL
ALBUMIN: 3.1 g/dL — ABNORMAL LOW (ref 3.4–5.0)
ALKALINE PHOSPHATASE: 97 U/L (ref 46–116)
ALT (SGPT): 27 U/L (ref 10–49)
ANION GAP: 13 mmol/L (ref 5–14)
AST (SGOT): 25 U/L (ref <=34)
BILIRUBIN TOTAL: 0.2 mg/dL — ABNORMAL LOW (ref 0.3–1.2)
BLOOD UREA NITROGEN: 34 mg/dL — ABNORMAL HIGH (ref 9–23)
BUN / CREAT RATIO: 3
CALCIUM: 7.3 mg/dL — ABNORMAL LOW (ref 8.7–10.4)
CHLORIDE: 104 mmol/L (ref 98–107)
CO2: 24 mmol/L (ref 20.0–31.0)
CREATININE: 13.4 mg/dL — ABNORMAL HIGH
EGFR CKD-EPI (2021) FEMALE: 3 mL/min/{1.73_m2} — ABNORMAL LOW (ref >=60)
GLUCOSE RANDOM: 104 mg/dL (ref 70–179)
POTASSIUM: 2.9 mmol/L — ABNORMAL LOW (ref 3.4–4.8)
PROTEIN TOTAL: 7.2 g/dL (ref 5.7–8.2)
SODIUM: 141 mmol/L (ref 135–145)

## 2023-06-22 LAB — PHOSPHORUS: PHOSPHORUS: 5.3 mg/dL — ABNORMAL HIGH (ref 2.4–5.1)

## 2023-06-22 LAB — CBC W/ AUTO DIFF
BASOPHILS ABSOLUTE COUNT: 0 10*9/L (ref 0.0–0.1)
BASOPHILS RELATIVE PERCENT: 0.3 %
EOSINOPHILS ABSOLUTE COUNT: 0.2 10*9/L (ref 0.0–0.5)
EOSINOPHILS RELATIVE PERCENT: 1.4 %
HEMATOCRIT: 26.9 % — ABNORMAL LOW (ref 34.0–44.0)
HEMOGLOBIN: 8.6 g/dL — ABNORMAL LOW (ref 11.3–14.9)
LYMPHOCYTES ABSOLUTE COUNT: 1.5 10*9/L (ref 1.1–3.6)
LYMPHOCYTES RELATIVE PERCENT: 11.4 %
MEAN CORPUSCULAR HEMOGLOBIN CONC: 32.1 g/dL (ref 32.0–36.0)
MEAN CORPUSCULAR HEMOGLOBIN: 29.4 pg (ref 25.9–32.4)
MEAN CORPUSCULAR VOLUME: 91.5 fL (ref 77.6–95.7)
MEAN PLATELET VOLUME: 7.4 fL (ref 6.8–10.7)
MONOCYTES ABSOLUTE COUNT: 0.9 10*9/L — ABNORMAL HIGH (ref 0.3–0.8)
MONOCYTES RELATIVE PERCENT: 6.8 %
NEUTROPHILS ABSOLUTE COUNT: 10.5 10*9/L — ABNORMAL HIGH (ref 1.8–7.8)
NEUTROPHILS RELATIVE PERCENT: 80.1 %
PLATELET COUNT: 305 10*9/L (ref 150–450)
RED BLOOD CELL COUNT: 2.94 10*12/L — ABNORMAL LOW (ref 3.95–5.13)
RED CELL DISTRIBUTION WIDTH: 14 % (ref 12.2–15.2)
WBC ADJUSTED: 13.1 10*9/L — ABNORMAL HIGH (ref 3.6–11.2)

## 2023-06-22 LAB — LACTATE SEPSIS, VENOUS: LACTATE BLOOD VENOUS: 1.5 mmol/L (ref 0.5–1.8)

## 2023-06-22 LAB — MAGNESIUM: MAGNESIUM: 1.6 mg/dL (ref 1.6–2.6)

## 2023-06-23 LAB — COMPREHENSIVE METABOLIC PANEL
ALBUMIN: 3.2 g/dL — ABNORMAL LOW (ref 3.4–5.0)
ALKALINE PHOSPHATASE: 107 U/L (ref 46–116)
ALT (SGPT): 28 U/L (ref 10–49)
ANION GAP: 15 mmol/L — ABNORMAL HIGH (ref 5–14)
AST (SGOT): 26 U/L (ref <=34)
BILIRUBIN TOTAL: 0.2 mg/dL — ABNORMAL LOW (ref 0.3–1.2)
BLOOD UREA NITROGEN: 38 mg/dL — ABNORMAL HIGH (ref 9–23)
BUN / CREAT RATIO: 3
CALCIUM: 8.4 mg/dL — ABNORMAL LOW (ref 8.7–10.4)
CHLORIDE: 102 mmol/L (ref 98–107)
CO2: 24 mmol/L (ref 20.0–31.0)
CREATININE: 14.31 mg/dL — ABNORMAL HIGH
EGFR CKD-EPI (2021) FEMALE: 3 mL/min/{1.73_m2} — ABNORMAL LOW (ref >=60)
GLUCOSE RANDOM: 88 mg/dL (ref 70–179)
POTASSIUM: 3.5 mmol/L (ref 3.4–4.8)
PROTEIN TOTAL: 7.7 g/dL (ref 5.7–8.2)
SODIUM: 141 mmol/L (ref 135–145)

## 2023-06-23 LAB — CBC W/ AUTO DIFF
BASOPHILS ABSOLUTE COUNT: 0.4 10*9/L — ABNORMAL HIGH (ref 0.0–0.1)
BASOPHILS RELATIVE PERCENT: 2.8 %
EOSINOPHILS ABSOLUTE COUNT: 0.2 10*9/L (ref 0.0–0.5)
EOSINOPHILS RELATIVE PERCENT: 1.4 %
HEMATOCRIT: 30.9 % — ABNORMAL LOW (ref 34.0–44.0)
HEMOGLOBIN: 10.1 g/dL — ABNORMAL LOW (ref 11.3–14.9)
LYMPHOCYTES ABSOLUTE COUNT: 3.7 10*9/L — ABNORMAL HIGH (ref 1.1–3.6)
LYMPHOCYTES RELATIVE PERCENT: 29.9 %
MEAN CORPUSCULAR HEMOGLOBIN CONC: 32.8 g/dL (ref 32.0–36.0)
MEAN CORPUSCULAR HEMOGLOBIN: 29.4 pg (ref 25.9–32.4)
MEAN CORPUSCULAR VOLUME: 89.8 fL (ref 77.6–95.7)
MEAN PLATELET VOLUME: 7.3 fL (ref 6.8–10.7)
MONOCYTES ABSOLUTE COUNT: 0.6 10*9/L (ref 0.3–0.8)
MONOCYTES RELATIVE PERCENT: 4.5 %
NEUTROPHILS ABSOLUTE COUNT: 7.7 10*9/L (ref 1.8–7.8)
NEUTROPHILS RELATIVE PERCENT: 61.4 %
PLATELET COUNT: 279 10*9/L (ref 150–450)
RED BLOOD CELL COUNT: 3.44 10*12/L — ABNORMAL LOW (ref 3.95–5.13)
RED CELL DISTRIBUTION WIDTH: 13.7 % (ref 12.2–15.2)
WBC ADJUSTED: 12.5 10*9/L — ABNORMAL HIGH (ref 3.6–11.2)

## 2023-06-23 LAB — IRON PANEL
IRON SATURATION: 40 % (ref 20–55)
IRON: 67 ug/dL
TOTAL IRON BINDING CAPACITY: 169 ug/dL — ABNORMAL LOW (ref 250–425)

## 2023-06-23 LAB — VITAMIN B12: VITAMIN B-12: 569 pg/mL (ref 211–911)

## 2023-06-23 LAB — PHOSPHORUS: PHOSPHORUS: 8.1 mg/dL — ABNORMAL HIGH (ref 2.4–5.1)

## 2023-06-23 LAB — FERRITIN: FERRITIN: 425.7 ng/mL — ABNORMAL HIGH

## 2023-06-23 LAB — FOLATE: FOLATE: 5.8 ng/mL (ref >=5.4)

## 2023-06-23 LAB — IONIZED CALCIUM VENOUS: CALCIUM IONIZED VENOUS (MG/DL): 3.91 mg/dL — ABNORMAL LOW (ref 4.40–5.40)

## 2023-06-23 LAB — MAGNESIUM: MAGNESIUM: 1.8 mg/dL (ref 1.6–2.6)

## 2023-06-23 MED ADMIN — hydrocortisone 2.5 % cream: TOPICAL | @ 14:00:00

## 2023-06-23 MED ADMIN — sevelamer (RENVELA) tablet 2,400 mg: 2400 mg | ORAL | @ 16:00:00

## 2023-06-23 MED ADMIN — fluticasone furoate (ARNUITY ELLIPTA) 100 mcg/actuation inhaler 1 puff: 1 | RESPIRATORY_TRACT | @ 13:00:00

## 2023-06-23 MED ADMIN — nystatin (MYCOSTATIN) powder: TOPICAL | @ 13:00:00

## 2023-06-23 MED ADMIN — sevelamer (RENVELA) tablet 2,400 mg: 2400 mg | ORAL | @ 22:00:00

## 2023-06-23 MED ADMIN — potassium chloride (KLOR-CON) packet 40 mEq: 40 meq | ORAL | Stop: 2023-06-22

## 2023-06-23 MED ADMIN — sevelamer (RENVELA) tablet 2,400 mg: 2400 mg | ORAL | @ 13:00:00

## 2023-06-23 MED ADMIN — aspirin chewable tablet 81 mg: 81 mg | ORAL | @ 13:00:00

## 2023-06-23 MED ADMIN — lactated ringers bolus 500 mL: 500 mL | INTRAVENOUS | @ 09:00:00 | Stop: 2023-06-23

## 2023-06-23 MED ADMIN — calcitriol (ROCALTROL) capsule 0.5 mcg: .5 ug | ORAL | @ 13:00:00

## 2023-06-23 MED ADMIN — apixaban (ELIQUIS) tablet 2.5 mg: 2.5 mg | ORAL | @ 13:00:00

## 2023-06-23 MED ADMIN — cefTRIAXone (ROCEPHIN) 1 g in sodium chloride 0.9 % (NS) 100 mL IVPB-MBP: 1 g | INTRAVENOUS | @ 08:00:00 | Stop: 2023-06-28

## 2023-06-23 NOTE — Unmapped (Signed)
PHYSICAL THERAPY  Evaluation (06/23/23 1444)     Patient Name:  Megan Robertson       Medical Record Number: [id number]   Date of Birth: [birth year]  Sex: Female        Post-Discharge Physical Therapy Recommendations:  PT Post Acute Discharge Recommendations: Skilled PT services indicated, 3x weekly   Equipment Recommendation  PT DME Recommendations: Defer to post acute          Treatment Diagnosis: Generalized muscle weakness  Treatment Diagnosis: decreased endurance     Activity Tolerance: Tolerated treatment well     ASSESSMENT  Problem List: Decreased mobility, Fall risk, Impaired ADLs, Impaired sensation, Decreased endurance, Impaired balance, Decreased strength      Assessment : Megan Robertson is a 52 y.o. 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        Upon evaluation, pt presents with decreased endurance and generalized weakness. She was able to ambulate in the room with SBA + rolling walker, and begin stair training, however at the end of PT session, reports 7/10 RPE. She did have BP drop from supine to sitting, however this improved with seated therex. Pt will benefit from skilled acute PT, as well as 3x post-acute PT recommendation to continue progressing mobility after hospitalization. After a review of the personal factors, comorbidities, clinical presentation, and examination of the number of affected body systems, the patient presents as a moderate complexity case.      Today's Interventions: Endurance activities, Patient/Family/Caregiver Education, Positioning, Therapeutic activity  Today's Interventions: PT eval, AMPAC, functional mobility as noted, pt education re: PT role, POC, activity pacing/progression     PLAN  Planned Frequency of Treatment: Plan of Care Initiated: 06/23/23  1-2x per day for: 3-4x week  Planned Treatment Duration: 07/07/23     Planned Interventions: Education (Patient/Family/Caregiver), Gait training, Home exercise program, Therapeutic Exercise, Therapeutic Activity, Self-care / Home Management training     Goals:   Patient and Family Goals: none stated     SHORT GOAL #1: Pt will perform all bed mobility independently with HOB flat               Time Frame : 2 weeks  SHORT GOAL #2: Pt will perform all functional transfers with LRAD, mod IND              Time Frame : 2 weeks  SHORT GOAL #3: Pt will ambulate 150 ft with LRAD, mod IND              Time Frame : 2 weeks  SHORT GOAL #4: Pt will ascend/descend 4 steps with R HR + SBA               Time Frame : 2 weeks                        Long Term Goal #1: Pt will ambulate >300 ft with LRAD, mod IND  Time Frame: 3 weeks     Prognosis:  Good  Positive Indicators: PLOF, participation, CLOF  Barriers to Discharge: Endurance deficits     SUBJECTIVE  Communication Preference: Verbal,     Patient reports: Agreeable to PT     Services patient receives prior to admission: OT, PT  Prior Functional Status: Pt reports independence with rollator vs. SPC. Reports husband helps  with stairs because they've gotten more difficult. Denies falls.  Equipment available at home: Straight cane, Rollator        Past Medical History:   Diagnosis Date    Abnormal mammogram     Abnormal Pap smear of cervix     Allergic     Anemia     Anxiety     Arthritis     Asthma     Back pain     Cancer (CMS-HCC) Ovarian    Depression     Diabetes mellitus (CMS-HCC) Borderline    Eczema     ESRD (end stage renal disease) on dialysis (CMS-HCC)     FSGS (focal segmental glomerulosclerosis) 1997    renal biopsy at [facility]    GERD (gastroesophageal reflux disease)     Gout     H/O adenoidectomy     had adenoids removed    Hypercholesteremia     Hypertension     Keloid     Ovarian cancer (CMS-HCC)     Pancreatitis     Psoriasis     QT prolongation 06/04/2023    Seizure (CMS-HCC)     unknown etiology; none for several years    Sepsis (CMS-HCC) 09/16/2022    Stroke (CMS-HCC) 2000    Tachycardia 07/31/2020            Social History     Tobacco Use    Smoking status: Former     Current packs/day: 0.00     Average packs/day: 2.0 packs/day for 1 year (2.0 ttl pk-yrs)     Types: Cigarettes     Start date: 06/30/2009     Quit date: 06/30/2010     Years since quitting: 12.9    Smokeless tobacco: Never   Substance Use Topics    Alcohol use: Not Currently       Past Surgical History:   Procedure Laterality Date    APPENDECTOMY      BREAST EXCISIONAL BIOPSY Right 11/28/2016    CHEMOTHERAPY      for ovarian CA in 2000    CHG US GUIDE, VASCULAR ACCESS N/A 01/29/2020    Procedure: ULTRASOUND GUIDANCE FOR VASC ACCESS REQUIRING [place] EVAL OF POTENTIAL ACCESS SITES;  Surgeon: Leona Carry, MD;  Location: MAIN OR [facility];  Service: Transplant    CHG US GUIDE, VASCULAR ACCESS N/A 06/08/2020    Procedure: ULTRASOUND GUIDANCE FOR VASC ACCESS REQUIRING [place] EVAL OF POTENTIAL ACCESS SITES;  Surgeon: Leona Carry, MD;  Location: MAIN OR [facility];  Service: Transplant    CHOLECYSTECTOMY      COMBINED HYSTEROSCOPY DIAGNOSTIC / D&C  07/07/2015    Planned for endometrial ablation, but patient had uterine perforation after D&C and unable to perform    HYSTERECTOMY      LEFT OOPHORECTOMY Left 04/12/2000    Removed with L fallopian tube for ectopic pregnancy    OOPHORECTOMY      PR COLONOSCOPY W/BIOPSY SINGLE/MULTIPLE  09/12/2021    Procedure: COLONOSCOPY, FLEXIBLE, PROXIMAL TO SPLENIC FLEXURE; WITH BIOPSY, SINGLE OR MULTIPLE;  Surgeon: Maris Berger, MD;  [facility];  Service: Gastroenterology    PR COLSC FLX W/RMVL OF TUMOR POLYP LESION SNARE TQ N/A 09/12/2021    Procedure: COLONOSCOPY FLEX; W/REMOV TUMOR/LES BY SNARE;  Surgeon: Maris Berger, MD;  Location: [facility];  Service: Gastroenterology    PR CREAT AV FISTULA,AUTOGENOUS GRAFT Left 01/29/2020    Procedure: Create Av Fistula (Separt Proc); Autog Gft;  Surgeon: Leona Carry, MD;  Location: MAIN OR [facility];  Service: Transplant    PR CREAT AV FISTULA,NON-AUTOGENOUS GRAFT Left 06/08/2020    Procedure: CREATE AV FISTULA (SEPARATE PROC); NONAUTOGENOUS GRAFT (EG, BIOLOGICAL COLLAGEN, THERMOPLASTIC GRAFT);  Surgeon: Leona Carry, MD;  Location: MAIN OR [facility];  Service: Transplant    PR EXPLORATION N/FLWD SURG UPPER EXTREMITY ARTERY Left 08/02/2020    Procedure: Exploration Not Followed By Surgical Repair, Artery; Upper Extremity (Eg, Axillary, Brachial, Radial, Ulnar);  Surgeon: Earney Mallet, MD;  Location: MAIN OR [facility];  Service: Vascular    PR INCIS/DRAIN ARM,DEEP ABSC/HEMATOMA Left 08/14/2020    Procedure: INCISION AND DRAINAGE, UPPER ARM OR ELBOW AREA; DEEP ABSCESS OR HEMATOMA;  Surgeon: Earney Mallet, MD;  Location: MAIN OR [facility];  Service: Vascular    PR INSERTION TUNNEL INTRAPERITONEAL CATH DIAL OPEN Midline 02/01/2021    Procedure: INSERTION OF INTRAPERITONEAL CANNULA OR CATHETER FOR DRAINAGE OR DIALYSIS; PERMANENT;  Surgeon: Loney Hering, MD;  Location: MAIN OR [place];  Service: Transplant    PR REMV ART CLOT AXILL-BRACH,ARM INCIS Left 08/02/2020    Procedure: Embolect/Thrombec; Axilry/Brachial Art-Arm Incs;  Surgeon: Earney Mallet, MD;  Location: MAIN OR [facility];  Service: Vascular    PR UPPER GI ENDOSCOPY,BIOPSY N/A 08/29/2017    Procedure: UGI ENDOSCOPY; WITH BIOPSY, SINGLE OR MULTIPLE;  Surgeon: Neysa Hotter, MD;  Location: GI PROCEDURES [place] [facility];  Service: Gastroenterology    PR VEIN BYPASS GRAFT,SUBCL-BRACHIAL Left 08/05/2020    Procedure: Bypass Graft, With Vein; Subclavian-Brachial;  Surgeon: Earney Mallet, MD;  Location: MAIN OR [facility];  Service: Vascular    SALPINGECTOMY Left 04/12/2000    Ectopic pregnancy    SALPINGECTOMY Right 11/22/2015    at time of total vaginal hysterectomy    TONSILECTOMY, ADENOIDECTOMY, BILATERAL MYRINGOTOMY AND TUBES      TOTAL VAGINAL HYSTERECTOMY 11/22/2015    With R salpingectomy             Family History   Adopted: Yes   Problem Relation Age of Onset    Heart attack Mother 40    Alcohol abuse Mother     Mental illness Mother     No Known Problems Father     No Known Problems Sister     No Known Problems Daughter     No Known Problems Maternal Grandmother     No Known Problems Maternal Grandfather     No Known Problems Paternal Grandmother     No Known Problems Paternal Grandfather     No Known Problems Brother     No Known Problems Son     No Known Problems Maternal Aunt     No Known Problems Maternal Uncle     No Known Problems Paternal Aunt     No Known Problems Paternal Uncle     No Known Problems Other     Anesthesia problems Neg Hx     Broken bones Neg Hx     Cancer Neg Hx     Clotting disorder Neg Hx     Collagen disease Neg Hx     Diabetes Neg Hx     Dislocations Neg Hx     Fibromyalgia Neg Hx     Gout Neg Hx     Hemophilia Neg Hx     Osteoporosis Neg Hx     Rheumatologic disease Neg Hx     Scoliosis Neg Hx     Severe sprains Neg Hx     Sickle  cell anemia Neg Hx     Spinal Compression Fracture Neg Hx     Melanoma Neg Hx     Basal cell carcinoma Neg Hx     Squamous cell carcinoma Neg Hx     Breast cancer Neg Hx         Allergies: Cephalexin, Mango, Peach (prunus persica), Wellbutrin [bupropion hcl], Egg, Fish containing products, Lactase-rennet, Lexiscan [regadenoson], and Mushroom      Objective Findings  Precautions / Restrictions  Precautions: Falls precautions  Weight Bearing Status: Non-applicable  Required Braces or Orthoses: Non-applicable     Pain Comments: Pt denies pain throughout  Medical Tests / Procedures: chart reviewed in EPIC  Equipment / Environment: Vascular access (PIV, TLC, Port-a-cath, PICC), Telemetry (PD catheter)     Vitals/Orthostatics : Semi-reclined at start: BP 103/53(68), after sup>sit: BP 85/69(75), after 2 min seated therex: 111/59, semi-reclined at end: 118/61 (76). Pt reported mild dizziness after ambulation, resolved with seated rest.     Living Situation  Living Environment: Trailer/Mobile home  Lives With: Spouse  Home Living: One level home, Tub/shower unit, Standard height toilet, Shower chair with back, Stairs to enter with rails  Rail placement (outside): Rail on right side  Number of Stairs to Enter (outside): 4  Caregiver Identified?: Yes  Caregiver Availability: 24 hours  Caregiver Ability: Limited lifting      Cognition: WFL  Orientation:  (not formally tested)  Visual/Perception: Wears Glasses/Contacts  Hearing: No deficit identified     Skin Inspection: Peeling  Skin Inspection comment: flaking/peeling noted on BLEs     Upper Extremities  UE ROM: Right WFL, Left WFL  UE Strength: Left WFL, Right WFL    Lower Extremities  LE ROM: Right WFL, Left WFL  LE Strength: Left WFL, Right WFL  LE comment: generalized weakness, however functional.    Face, Cervical and Trunk ROM  Cervical ROM: WFL  Trunk ROM: WFL     Coordination: WFL  Proprioception: WFL  Sensation: Numbness, Tingling  Posture: Rounded shoulders  Motor/Sensory/Neuro Comments: pt reports tingling in B hands, insides of thighs    Static Sitting-Level of Assistance: Independent  Dynamic Sitting-Level of Assistance: Clinical biochemist Standing-Level of Assistance: Stand by assistance  Dynamic Standing - Level of Assistance: Stand by assistance  Standing Balance comments: with RW      Bed Mobility: Supine to Sit  Supine to Sit assistance level: Standby assist, set-up cues, supervision of patient - no hands on  Bed Mobility comments: sup<>sit HOB partially elevated: SBA.     Transfers: Sit to Stand  Sit to Stand assistance level: Contact guard assist, steadying assist  Transfer comments: STS from EOB x1 with RW + CGA, 1x with L HHA + Min A.      Gait Level of Assistance: Standby assist, set-up cues, supervision of patient - no hands on  Gait Assistive Device: Rolling walker  Gait Distance Ambulated (ft): 20 ft  Skilled Treatment Performed: Pt amb 20 ft with RW + SBA. Pt with slow gait speed, forward flexed posture.     Stairs: Pt ascended/descended x2 (4 in) steps with CGA + BHHA, x2 (4 in) steps with R HR + L HHA, Min A. Able to perform reciprocal stepping pattern. For descent, pt stepped backwards due to steps being too small to safely turn on this date.      Endurance: fair - pt reports 7/10 RPE at end of session    Patient at end of session: All  needs in reach, In bed, Lines intact    Physical Therapy Session Duration  PT Individual [mins]: 20    AM-PAC-6 click  Help currently need turning over In bed?: A Little - Minimal/Contact Guard Assist/Supervision  Help currently needed sitting down/standing up from chair with arms? : A Little - Minimal/Contact Guard Assist/Supervision  Help currently needed moving from supine to sitting on edge of bed?: A Little - Minimal/Contact Guard Assist/Supervision  Help currently needed moving to and from bed from wheelchair?: A Little - Minimal/Contact Guard Assist/Supervision  Help currently needed walking in a hospital room?: A Little - Minimal/Contact Guard Assist/Supervision  Help currently needed climbing 3-5 steps with railing?: A Little - Minimal/Contact Guard Assist/Supervision    Basic Mobility Score 6 click: 18    6 click Score (in points): % of Functional Impairment, Limitation, Restriction  6: 100% impaired, limited, restricted  7-8: At least 80%, but less than 100% impaired, limited restricted  9-13: At least 60%, but less than 80% impaired, limited restricted  14-19: At least 40%, but less than 60% impaired, limited restricted  20-22: At least 20%, but less than 40% impaired, limited restricted  23: At least 1%, but less than 20% impaired, limited restricted  24: 0% impaired, limited restricted    I attest that I have reviewed the above information.  Signed: Lorne Skeens, PT  Filed 06/23/2023

## 2023-06-23 NOTE — Unmapped (Signed)
OCCUPATIONAL THERAPY  Evaluation (06/23/23 0849)    Patient Name:  Megan Robertson       Medical Record Number: [id number]     Date of Birth: 1971/06/27  Sex: Female      Post-Discharge Occupational Therapy Recommendations: 3x weekly          Equipment Recommendation  OT DME Recommendations: None       OT Treatment Diagnosis: Limitation of activities due to disability, Generalized muscle weakness         Assessment  Problem List: Decreased activity tolerance, Decreased endurance, Decreased mobility, Fall risk, Impaired ADLs, Decreased strength  Personal Factors/Comorbidities (Occupational Profile and History Review): Expanded (Moderate)  Assessment of Occupational Performance : Endurance, Strength, Mobility  Clinical Decision Making: Moderate Complexity    Assessment: Megan Robertson is a 52 y.o. 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 Pt presents to OT eval this date with the above listed deficits. She is currently requiring increased time and rest breaks during ADLs and functional mobility. Pt will benefit from skilled acute OT at this time to maximize safety and independence with ADLs/functional mobility, to decrease caregiver burden, and to limit further deconditioning. OT recommending 3x at discharge.       Today's Interventions: Education: OT role and POC, importance of OOB functional mobility, energy conservation with seated rest breaks/seated ADLs/pacing    Activity Tolerance During Today's Session  Tolerated treatment well, Limited by fatigue    Plan  Planned Frequency of Treatment: Plan of Care Initiated: 06/23/23  1-2x per day for: 3-4x week  Planned Treatment Duration: 07/07/23    Planned Interventions:  Education (Patient/Family/Caregiver), Home Exercise Program, Self-Care/Home Training, Therapeutic Exercise, Therapeutic Activity      GOALS:   Patient and Family Goals: To walk    Short Term:   SHORT GOAL #1: Pt will complete full body dressing with Mod I   Time Frame : 2 weeks  SHORT GOAL #2: Pt will tolerate 7+ minute standing ADL with mod I   Time Frame : 2 weeks  SHORT GOAL #3: Pt will complete toileting, including toilet transfers, with Mod I   Time Frame : 2 weeks      Long Term Goal #1: Pt will score 22+/24 on AMPAC within 4 weeks  Time Frame: 4 weeks    Prognosis:  Good  Positive Indicators:  PLOF, support  Barriers to Discharge: Endurance deficits    Subjective  Medical Updates Since Last Visit/Relevant PMH Affecting Clinical Decision Making:    Prior Functional Status Pt reports she has required assist from her husband for LB dressing (socks and shoes) and tub transfers. Since last admission, pt was requiring increased time for functional mobility and has been using a RW and SPC since her return home on 8/19. Husband takes care of most iADLs (meal prep, cleaning, driving) and occasionally assists with med management. Pt denies any recent falls.       Services patient receives prior to admission:  (Since last admit, pt was receiving home health OT and PT)    Patient / Caregiver reports: Hold on I feel tired      Past Medical History:   Diagnosis Date    Abnormal mammogram     Abnormal Pap smear of cervix     Allergic     Anemia     Anxiety  Arthritis     Asthma     Back pain     Cancer (CMS-HCC) Ovarian    Depression     Diabetes mellitus (CMS-HCC) Borderline    Eczema     ESRD (end stage renal disease) on dialysis (CMS-HCC)     FSGS (focal segmental glomerulosclerosis) 1997    renal biopsy at [facility]    GERD (gastroesophageal reflux disease)     Gout     H/O adenoidectomy     had adenoids removed    Hypercholesteremia     Hypertension     Keloid     Ovarian cancer (CMS-HCC)     Pancreatitis     Psoriasis     QT prolongation 06/04/2023    Seizure (CMS-HCC)     unknown etiology; none for several years    Sepsis (CMS-HCC) 09/16/2022    Stroke (CMS-HCC) 2000 Tachycardia 07/31/2020    Social History     Tobacco Use    Smoking status: Former     Current packs/day: 0.00     Average packs/day: 2.0 packs/day for 1 year (2.0 ttl pk-yrs)     Types: Cigarettes     Start date: 06/30/2009     Quit date: 06/30/2010     Years since quitting: 12.9    Smokeless tobacco: Never   Substance Use Topics    Alcohol use: Not Currently      Past Surgical History:   Procedure Laterality Date    APPENDECTOMY      BREAST EXCISIONAL BIOPSY Right 11/28/2016    CHEMOTHERAPY      for ovarian CA in 2000    CHG US GUIDE, VASCULAR ACCESS N/A 01/29/2020    Procedure: ULTRASOUND GUIDANCE FOR VASC ACCESS REQUIRING [place] EVAL OF POTENTIAL ACCESS SITES;  Surgeon: Leona Carry, MD;  Location: MAIN OR [facility];  Service: Transplant    CHG US GUIDE, VASCULAR ACCESS N/A 06/08/2020    Procedure: ULTRASOUND GUIDANCE FOR VASC ACCESS REQUIRING [place] EVAL OF POTENTIAL ACCESS SITES;  Surgeon: Leona Carry, MD;  Location: MAIN OR [facility];  Service: Transplant    CHOLECYSTECTOMY      COMBINED HYSTEROSCOPY DIAGNOSTIC / D&C  07/07/2015    Planned for endometrial ablation, but patient had uterine perforation after D&C and unable to perform    HYSTERECTOMY      LEFT OOPHORECTOMY Left 04/12/2000    Removed with L fallopian tube for ectopic pregnancy    OOPHORECTOMY      PR COLONOSCOPY W/BIOPSY SINGLE/MULTIPLE  09/12/2021    Procedure: COLONOSCOPY, FLEXIBLE, PROXIMAL TO SPLENIC FLEXURE; WITH BIOPSY, SINGLE OR MULTIPLE;  Surgeon: Maris Berger, MD;  Location[facility];  Service: Gastroenterology    PR COLSC FLX W/RMVL OF TUMOR POLYP LESION SNARE TQ N/A 09/12/2021    Procedure: COLONOSCOPY FLEX; W/REMOV TUMOR/LES BY SNARE;  Surgeon: Maris Berger, MD;  Location: [facility];  Service: Gastroenterology    PR CREAT AV FISTULA,AUTOGENOUS GRAFT Left 01/29/2020    Procedure: Create Av Fistula (Separt Proc); Autog Gft;  Surgeon: Leona Carry, MD;  Location: MAIN OR [facility];  Service: Transplant    PR CREAT AV FISTULA,NON-AUTOGENOUS GRAFT Left 06/08/2020    Procedure: CREATE AV FISTULA (SEPARATE PROC); NONAUTOGENOUS GRAFT (EG, BIOLOGICAL COLLAGEN, THERMOPLASTIC GRAFT);  Surgeon: Leona Carry, MD;  Location: MAIN OR [facility];  Service: Transplant    PR EXPLORATION N/FLWD SURG UPPER EXTREMITY ARTERY Left 08/02/2020    Procedure: Exploration Not Followed By Surgical Repair,  Artery; Upper Extremity (Eg, Axillary, Brachial, Radial, Ulnar);  Surgeon: Earney Mallet, MD;  Location: MAIN OR [facility];  Service: Vascular    PR INCIS/DRAIN ARM,DEEP ABSC/HEMATOMA Left 08/14/2020    Procedure: INCISION AND DRAINAGE, UPPER ARM OR ELBOW AREA; DEEP ABSCESS OR HEMATOMA;  Surgeon: Earney Mallet, MD;  Location: MAIN OR [place];  Service: Vascular    PR INSERTION TUNNEL INTRAPERITONEAL CATH DIAL OPEN Midline 02/01/2021    Procedure: INSERTION OF INTRAPERITONEAL CANNULA OR CATHETER FOR DRAINAGE OR DIALYSIS; PERMANENT;  Surgeon: Loney Hering, MD;  Location: MAIN OR [place];  Service: Transplant    PR REMV ART CLOT AXILL-BRACH,ARM INCIS Left 08/02/2020    Procedure: Embolect/Thrombec; Axilry/Brachial Art-Arm Incs;  Surgeon: Earney Mallet, MD;  Location: MAIN OR [facility];  Service: Vascular    PR UPPER GI ENDOSCOPY,BIOPSY N/A 08/29/2017    Procedure: UGI ENDOSCOPY; WITH BIOPSY, SINGLE OR MULTIPLE;  Surgeon: Neysa Hotter, MD;  Location: GI PROCEDURES [facility];  Service: Gastroenterology    PR VEIN BYPASS GRAFT,SUBCL-BRACHIAL Left 08/05/2020    Procedure: Bypass Graft, With Vein; Subclavian-Brachial;  Surgeon: Earney Mallet, MD;  Location: MAIN OR [facility];  Service: Vascular    SALPINGECTOMY Left 04/12/2000    Ectopic pregnancy    SALPINGECTOMY Right 11/22/2015    at time of total vaginal hysterectomy    TONSILECTOMY, ADENOIDECTOMY, BILATERAL MYRINGOTOMY AND TUBES      TOTAL VAGINAL HYSTERECTOMY  11/22/2015    With R salpingectomy    Family History   Adopted: Yes   Problem Relation Age of Onset    Heart attack Mother 56    Alcohol abuse Mother     Mental illness Mother     No Known Problems Father     No Known Problems Sister     No Known Problems Daughter     No Known Problems Maternal Grandmother     No Known Problems Maternal Grandfather     No Known Problems Paternal Grandmother     No Known Problems Paternal Grandfather     No Known Problems Brother     No Known Problems Son     No Known Problems Maternal Aunt     No Known Problems Maternal Uncle     No Known Problems Paternal Aunt     No Known Problems Paternal Uncle     No Known Problems Other     Anesthesia problems Neg Hx     Broken bones Neg Hx     Cancer Neg Hx     Clotting disorder Neg Hx     Collagen disease Neg Hx     Diabetes Neg Hx     Dislocations Neg Hx     Fibromyalgia Neg Hx     Gout Neg Hx     Hemophilia Neg Hx     Osteoporosis Neg Hx     Rheumatologic disease Neg Hx     Scoliosis Neg Hx     Severe sprains Neg Hx     Sickle cell anemia Neg Hx     Spinal Compression Fracture Neg Hx     Melanoma Neg Hx     Basal cell carcinoma Neg Hx     Squamous cell carcinoma Neg Hx     Breast cancer Neg Hx         Cephalexin, Mango, Peach (prunus persica), Wellbutrin [bupropion hcl], Egg, Fish containing products, Lactase-rennet, Lexiscan [regadenoson], and Mushroom     Objective Findings  Precautions / Restrictions  Falls  precautions       Weight Bearing  Non-applicable    Required Braces or Orthoses  Non-applicable    Communication Preference  Verbal       Pain  Pt denying pain    Equipment / Environment  Vascular access (PIV, TLC, Port-a-cath, PICC)    Living Situation  Living Environment: Trailer/Mobile home  Lives With: Spouse  Home Living: One level home, Tub/shower unit, Standard height toilet, Stairs to enter without rails, Shower chair with back  Number of Stairs to Enter (outside): 4  Caregiver Identified?: Yes  Caregiver Availability: 24 hours  Caregiver Ability: Limited lifting  Equipment available at home: Straight cane, Rollator     Cognition   Orientation Level:  Oriented x 4   Arousal/Alertness:  Appropriate responses to stimuli   Attention Span:  Appears intact   Memory:  Appears intact   Following Commands:  Follows all commands and directions without difficulty   Safety Judgment:  Good awareness of safety precautions   Awareness of Errors and Problem Solving:  Patient self-corrected errors   Comments:      Vision / Hearing   Vision: Glasses not present, Wears glasses all the time     Hearing: No deficit identified         Hand Function:  Right Hand Function: Right hand grip strength, ROM and coordination WNL  Left Hand Function: Left hand function impaired  Left Hand Impairment: grip strength poor, ROM poor (Unable to fully grasp with L hand, primarily using thumb and index finger.)  Hand Dominance: Right    Skin Inspection:  Skin Inspection:  (Pt with peeling/dry skin)      ROM / Strength:  UE ROM/Strength: Right WFL, Left Impaired/Limited  LUE Impairment: Limited AROM, Reduced strength (3+/5 for elbow MMT, shoulder not tested d/t unable to reach full shoulder flexion)  UE ROM/ Strength Comment: Pt reports she had a surgery in the past and since then has had impaired LUE strength and ROM  LE ROM/Strength: Left WFL, Right WFL  LE ROM/ Strength Comment: Generalized weakness    Coordination:  Coordination: WFL    Sensation:  Sensory/ Proprioception/ Stereognosis comments: Pt reports constant numbness/tingling in B hands and at B inner thighs.    Balance:  Static Sitting-Level of Assistance: Supervision  Dynamic Sitting-Level of Assistance: Supervision    Static Standing-Level of Assistance: Contact guard  Dynamic Standing - Level of Assistance: Contact guard  Standing Balance comments: using RW    Functional Mobility  Transfers: Standby assist (STS from EOB x2 trials using RW, STS from chair for oral hygiene)  Bed Mobility - Needs Assistance: Standby assist (Supine <> sit with HOB raised, and increased time)  Ambulation: SBA from EOB <> sink using RW. Pt requiring increased time d/t slowed functional mobility, two standing rest breaks, and 1 seated rest break at the sink to complete oral hygiene.    ADLs  ADLs: Needs assistance with ADLs  Feeding - Needs Assistance: Set Up Assist, Performed seated  Grooming - Needs Assistance: Set Up Assist, Performed seated (Brushing teeth in sitting, Rinsing mouth in standing, using one UE for balance support. pt reporting fatigue after this.)  Bathing - Needs Assistance: Performed seated, Min assist  Toileting - Needs Assistance: Min assist, Performed seated  UB Dressing - Needs Assistance: Set Up Assist, Performed seated  LB Dressing - Needs Assistance: Contact Guard assist, Performed seated (Seated EOB to don/doff socks in figure 4 position)    Vitals / Orthostatics  Vitals/Orthostatics: VSS. Pt denies dizziness/lightheadedness. Supine prior to session: 91/50 (62) 98bpm. Sitting EOB 97/60 (72) 102 bpm. Standing 101/64 (74) 116 bpm. Pt reports she feels like her heart is beating faster during this hospitlization and that she has increased work of breath    Patient at end of session: All needs in reach, In bed, Lines intact, Nurse notified     Occupational Therapy Session Duration  OT Individual [mins]: 32       AM-PAC-Daily Activity  Lower Body Dressing assistance needs: A Little - Minimal/Contact Guard Assist/Supervision  Bathing assistance needs: A Little - Minimal/Contact Guard Assist/Supervision  Toileting assistance needs: A Little - Minimal/Contact Guard Assist/Supervision  Upper Body Dressing assistance needs: A Little - Minimal/Contact Guard Assist/Supervision  Personal Grooming assistance needs: A Little - Minimal/Contact Guard Assist/Supervision  Eating Meals assistance needs: A Little - Minimal/Contact Guard Assist/Supervision    Daily Activity Score: 18    Score (in points): % of Functional Impairment, Limitation, Restriction  6: 100% impaired, limited, restricted  7-8: At least 80%, but less than 100% impaired, limited restricted  9-13: At least 60%, but less than 80% impaired, limited restricted  14-19: At least 40%, but less than 60% impaired, limited restricted  20-22: At least 20%, but less than 40% impaired, limited restricted  23: At least 1%, but less than 20% impaired, limited restricted  24: 0% impaired, limited restricted      I attest that I have reviewed the above information.  Signed: Baker Pierini, OT  Filed 06/23/2023

## 2023-06-23 NOTE — Unmapped (Signed)
Patient arrived from ED in a stable condition,denies pain,IV antibiotic given,tolerated well.Tele monitoring in place,fall precaution maintained,call bell within reach.  Problem: Adult Inpatient Plan of Care  Goal: Plan of Care Review  06/23/2023 0453 by Hughes Better, RN  Outcome: Ongoing - Unchanged  06/23/2023 0452 by Hughes Better, RN  Outcome: Ongoing - Unchanged  Goal: Patient-Specific Goal (Individualized)  06/23/2023 0453 by Hughes Better, RN  Outcome: Ongoing - Unchanged  06/23/2023 0452 by Hughes Better, RN  Outcome: Ongoing - Unchanged  Goal: Absence of Hospital-Acquired Illness or Injury  06/23/2023 0453 by Hughes Better, RN  Outcome: Ongoing - Unchanged  06/23/2023 0452 by Hughes Better, RN  Outcome: Ongoing - Unchanged  Goal: Optimal Comfort and Wellbeing  06/23/2023 0453 by Hughes Better, RN  Outcome: Ongoing - Unchanged  06/23/2023 0452 by Hughes Better, RN  Outcome: Ongoing - Unchanged  Goal: Readiness for Transition of Care  06/23/2023 0453 by Hughes Better, RN  Outcome: Ongoing - Unchanged  06/23/2023 0452 by Hughes Better, RN  Outcome: Ongoing - Unchanged  Goal: Rounds/Family Conference  06/23/2023 0453 by Hughes Better, RN  Outcome: Ongoing - Unchanged  06/23/2023 0452 by Hughes Better, RN  Outcome: Ongoing - Unchanged     Problem: Fall Injury Risk  Goal: Absence of Fall and Fall-Related Injury  06/23/2023 0453 by Hughes Better, RN  Outcome: Ongoing - Unchanged  06/23/2023 0452 by Hughes Better, RN  Outcome: Ongoing - Unchanged     Problem: Peritoneal Dialysis  Goal: Optimize Fluid and Electrolyte Balance  Outcome: Ongoing - Unchanged  Goal: Absence of Infection Signs and Symptoms  Outcome: Ongoing - Unchanged  Goal: Safe, Effective Therapy Delivery  Outcome: Ongoing - Unchanged

## 2023-06-23 NOTE — Unmapped (Signed)
 [facility] Emergency Department Provider Note         ED Clinical Impression     Final diagnoses:   Hypotension, unspecified hypotension type (Primary)   Hypokalemia   Muscle cramps       Presenting History and MDM     HPI    June 22, 2023 7:04 PM   Megan Robertson is a 52 y.o. female with history of ESRD on PD, presenting with low blood pressure, fatigue, diffuse muscle cramping.  She began having low blood pressure after performing peritoneal dialysis at home this evening.  She has not missed any sessions recently and has been having full sessions.  She also endorses muscle cramping in her legs that also moves up to her arms.  She denies any fevers at home, chest pain, shortness of breath, cough.  She does still make some urine but has not made any for the past few days.  She has been taking her midodrine  as prescribed.    Patient received 800 mL of IV fluid with EMS.  Their initial blood pressure readings were 60s/30s.    Physical Exam     BP 90/51  - Pulse 94  - Temp 36.5 ??C (97.7 ??F) (Oral)  - Resp 21  - LMP  (LMP Unknown)  - SpO2 97%     General: Awake and alert and oriented, in no distress.  Skin: Skin is warm and dry.  Lungs: Normal respiratory effort. Breath sounds clear bilaterally.  Heart: Regular rhythm, normal heart sounds.  Abdomen: Soft, nondistended, and non-tender to palpation.  PD site with normal skin surrounding in left abdomen.  Musculoskeletal: No deformities or tenderness.  Extremities: No peripheral edema.  Neurological: Pupils PERRL, moving all extremities equally, no gross focal neurologic deficits are appreciated.  Psychiatric: Normal affect and behavior for situation        MDM: Megan Robertson is 52 y.o. female history of ESRD on PD, hypotension presenting with hypotensive episode following peritoneal dialysis session.  No infectious symptoms.  She has had problems in the past with maintaining her blood pressure after peritoneal dialysis.  Differential diagnosis includes hypovolemia secondary to dialysis. I have considered but have low concern for other forms of shock such as septic, obstructive, cardiogenic.  Will check electrolytes.    Believe I will be able to stabilize the patient with fluids however without adjustment of patient's dialysis, patient will have recurrent hypotension.  Believe she requires adjustment of dialysis and further stabilization with inpatient team.    Diagnostic workup as below.     Orders Placed This Encounter   Procedures    Extra Lab Tubes    CBC w/ Differential    Comprehensive Metabolic Panel    ECG 12 Lead       Patient Treated with:  Medications - No data to display      Will reassess as we get results and update below    ED Course as of 06/23/23 1933   Sat Jun 22, 2023   2029 Lactate, Venous: 1.5  Reassuring that patient not experiencing shock.   2029 Potassium(!): 2.9  Will replete given patient with muscle cramps.   2041 Spoke with patient, given the recurrent nature of this hypovolemia, despite the fact that she has better blood pressure at this time, believe she would benefit from admission to the hospital given she requires further peritoneal dialysis tonight.  Moreover, patient feeling uncomfortable with discharge home in the setting.  Will page nephrology  and will also page [facility] for admission   2043 Hi, I'd like to admit Megan Robertson MRN [id number] in the ED for symptomatic hypokalemia and hypovolemia secondary to peritoneal dialysis. Thanks, Vannie     Paged [facility]     2043 Hi, I'd like to discuss Megan Robertson MRN [id number] in the ED for peritoneal dialysis associated hypotension. Maeola Vannie [phone]    Paged nephrology     2108 Spoke to nephrology, they will not perform dialysis tonight, but agree with admission.  They recommend dialysis fluid culture, body fluid cell count.                 Discussion of Management with other Physicians, QHP, or Appropriate Source:  See ED course above for documentation of pages sent, and discussions with collaborating professionals.  Independent Interpretation of Studies: See ED Course above for my independent reviews.   External Records Reviewed: Recent DC summary 8/19  If Patient Not Admitted--Escalation of Care, Consideration of Admission/Observation/Transfer: Escalation of Care, Consideration of Admission/Observation/Transfer: admit  Social determinants that significantly affected care: In addition to as noted previously above: None  Prescription drug(s) considered but not prescribed: In addition to as noted previously above: None  Diagnostic tests considered but not performed: In addition to as noted previously above: None              _____________________________________________________________________    The case was discussed with attending physician who is in agreement with the above assessment and plan    Additional Medical Decision Making     I have reviewed the vital signs and the nursing notes. Labs and radiology results that were available during my care of the patient were independently reviewed by me and considered in my medical decision making.   I independently visualized the EKG tracing if performed  I independently visualized the radiology images if performed  I reviewed the patient's prior medical records if available.  Additional history obtained from family if available.    If patient prefers a language other than English, I have used an interpreter or interpreting service for our interactions unless directed otherwise by the patient.    Other History     CHIEF COMPLAINT:   Chief Complaint   Patient presents with    Hypotension       PAST MEDICAL HISTORY/PAST SURGICAL HISTORY:   Past Medical History:   Diagnosis Date    Abnormal mammogram     Abnormal Pap smear of cervix     Allergic     Anemia     Anxiety     Arthritis     Asthma     Back pain     Cancer (CMS-HCC) Ovarian    Depression     Diabetes mellitus (CMS-HCC) Borderline    Eczema     ESRD (end stage renal disease) on dialysis (CMS-HCC)     FSGS (focal segmental glomerulosclerosis) 1997    renal biopsy at [facility]    GERD (gastroesophageal reflux disease)     Gout     H/O adenoidectomy     had adenoids removed    Hypercholesteremia     Hypertension     Keloid     Ovarian cancer (CMS-HCC)     Pancreatitis     Psoriasis     QT prolongation 06/04/2023    Seizure (CMS-HCC)     unknown etiology; none for several years    Stroke (CMS-HCC) 2000       Past  Surgical History:   Procedure Laterality Date    APPENDECTOMY      BREAST EXCISIONAL BIOPSY Right 11/28/2016    CHEMOTHERAPY      for ovarian CA in 2000    CHG US  GUIDE, VASCULAR ACCESS N/A 01/29/2020    Procedure: ULTRASOUND GUIDANCE FOR VASC ACCESS REQUIRING US  EVAL OF POTENTIAL ACCESS SITES;  Surgeon: Marsa Sam Boss, MD;  Location: MAIN OR [facility];  Service: Transplant    CHG US  GUIDE, VASCULAR ACCESS N/A 06/08/2020    Procedure: ULTRASOUND GUIDANCE FOR VASC ACCESS REQUIRING US  EVAL OF POTENTIAL ACCESS SITES;  Surgeon: Marsa Sam Boss, MD;  Location: MAIN OR [facility];  Service: Transplant    CHOLECYSTECTOMY      COMBINED HYSTEROSCOPY DIAGNOSTIC / D&C  07/07/2015    Planned for endometrial ablation, but patient had uterine perforation after D&C and unable to perform    HYSTERECTOMY      LEFT OOPHORECTOMY Left 04/12/2000    Removed with L fallopian tube for ectopic pregnancy    OOPHORECTOMY      PR COLONOSCOPY W/BIOPSY SINGLE/MULTIPLE  09/12/2021    Procedure: COLONOSCOPY, FLEXIBLE, PROXIMAL TO SPLENIC FLEXURE; WITH BIOPSY, SINGLE OR MULTIPLE;  Surgeon: Dagoberto DELENA Morgan, MD;  Location: [facility];  Service: Gastroenterology    PR COLSC FLX W/RMVL OF TUMOR POLYP LESION SNARE TQ N/A 09/12/2021    Procedure: COLONOSCOPY FLEX; W/REMOV TUMOR/LES BY SNARE;  Surgeon: Dagoberto DELENA Morgan, MD;  Location: [facility];  Service: Gastroenterology    PR CREAT AV FISTULA,AUTOGENOUS GRAFT Left 01/29/2020    Procedure: Create Av Fistula (Separt Proc); Autog Gft;  Surgeon: Marsa Sam Boss, MD;  Location: MAIN OR [facility];  Service: Transplant    PR CREAT AV FISTULA,NON-AUTOGENOUS GRAFT Left 06/08/2020    Procedure: CREATE AV FISTULA (SEPARATE PROC); NONAUTOGENOUS GRAFT (EG, BIOLOGICAL COLLAGEN, THERMOPLASTIC GRAFT);  Surgeon: Marsa Sam Boss, MD;  Location: MAIN OR [facility];  Service: Transplant    PR EXPLORATION N/FLWD SURG UPPER EXTREMITY ARTERY Left 08/02/2020    Procedure: Exploration Not Followed By Surgical Repair, Artery; Upper Extremity (Eg, Axillary, Brachial, Radial, Ulnar);  Surgeon: Caretha Altamease Chant, MD;  Location: MAIN OR [facility];  Service: Vascular    PR INCIS/DRAIN ARM,DEEP ABSC/HEMATOMA Left 08/14/2020    Procedure: INCISION AND DRAINAGE, UPPER ARM OR ELBOW AREA; DEEP ABSCESS OR HEMATOMA;  Surgeon: Caretha Altamease Chant, MD;  Location: MAIN OR [place];  Service: Vascular    PR INSERTION TUNNEL INTRAPERITONEAL CATH DIAL OPEN Midline 02/01/2021    Procedure: INSERTION OF INTRAPERITONEAL CANNULA OR CATHETER FOR DRAINAGE OR DIALYSIS; PERMANENT;  Surgeon: Conchita Peer, MD;  Location: MAIN OR [place];  Service: Transplant    PR REMV ART CLOT AXILL-BRACH,ARM INCIS Left 08/02/2020    Procedure: Embolect/Thrombec; Axilry/Brachial Art-Arm Incs;  Surgeon: Caretha Altamease Chant, MD;  Location: MAIN OR [facility];  Service: Vascular    PR UPPER GI ENDOSCOPY,BIOPSY N/A 08/29/2017    Procedure: UGI ENDOSCOPY; WITH BIOPSY, SINGLE OR MULTIPLE;  Surgeon: Thedora Alm Plain, MD;  Location: GI PROCEDURES [facility];  Service: Gastroenterology    PR VEIN BYPASS GRAFT,SUBCL-BRACHIAL Left 08/05/2020    Procedure: Bypass Graft, With Vein; Subclavian-Brachial;  Surgeon: Caretha Altamease Chant, MD;  Location: MAIN OR [facility];  Service: Vascular    SALPINGECTOMY Left 04/12/2000    Ectopic pregnancy    SALPINGECTOMY Right 11/22/2015    at time of total vaginal hysterectomy    TONSILECTOMY, ADENOIDECTOMY, BILATERAL MYRINGOTOMY AND TUBES      TOTAL VAGINAL HYSTERECTOMY  11/22/2015 With R salpingectomy       MEDICATIONS:   No current facility-administered medications for this encounter.    Current Outpatient Medications:     albuterol  HFA 90 mcg/actuation inhaler, Inhale 2 puffs every six (6) hours as needed for wheezing., Disp: 8 g, Rfl: 3    apixaban  (ELIQUIS ) 2.5 mg Tab, Take 1 tablet (2.5 mg total) by mouth two (2) times a day., Disp: 90 tablet, Rfl: 2    apixaban  (ELIQUIS ) 2.5 mg Tab, Take 1 tablet (2.5 mg total) by mouth two (2) times a day., Disp: 30 tablet, Rfl: 2    aspirin  81 MG chewable tablet, Chew 1 tablet (81 mg total) daily., Disp: 30 tablet, Rfl: 1    atorvastatin  (LIPITOR ) 80 MG tablet, Take 1 tablet (80 mg total) by mouth daily., Disp: 30 tablet, Rfl: 0    beclomethasone dipropionate  (QVAR  REDIHALER) 80 mcg/actuation inhaler, Inhale 1 puff  in the morning and 1 puff in the evening., Disp: 10.6 g, Rfl: 3    calcitriol  (ROCALTROL ) 0.5 MCG capsule, Take 1 capsule (0.5 mcg total) by mouth daily., Disp: 30 capsule, Rfl: 2    calcitriol  (ROCALTROL ) 0.5 MCG capsule, Take 1 capsule (0.5 mcg total) by mouth daily., Disp: 30 capsule, Rfl: 2    fluocinolone  0.01 % external oil, Apply topically daily as needed. Use for psoriasis and scalp scaling., Disp: 120 mL, Rfl: 5    furosemide  (LASIX ) 40 MG tablet, Take 1 tablet up to 3 times weekly as needed if you develop leg swelling., Disp: 30 tablet, Rfl: 0    furosemide  (LASIX ) 40 MG tablet, Take 1 tablet up to 3 times weekly as needed for leg swelling., Disp: 12 tablet, Rfl: 2    midodrine  (PROAMATINE ) 5 MG tablet, Take 1 tablet (5 mg total) by mouth two (2) times a day as needed (Take 1 tab after PD. Please check your BP each afternoon, take 1 tab for systolic pressure less than 90)., Disp: 30 tablet, Rfl: 2    midodrine  (PROAMATINE ) 5 MG tablet, Take 1 tablet (5 mg total) by mouth two (2) times a day as needed. (Take 1 tab after PD. Please check your BP each afternoon, take 1 tab for systolic pressure less than 90), Disp: 60 tablet, Rfl: 2    nystatin  (MYCOSTATIN ) 100,000 unit/gram powder, Apply topically two (2) times a day. Apply under breasts 2 times daily until resolution, Disp: 15 g, Rfl: 0    nystatin  (MYCOSTATIN ) 100,000 unit/gram powder, Apply under breasts two times daily until resolution, Disp: 60 g, Rfl: 1    ondansetron  (ZOFRAN ) 4 MG tablet, Take 1 tablet (4 mg total) by mouth every six (6) hours for 3 days., Disp: 12 tablet, Rfl: 0    polyethylene glycol (GLYCOLAX ) 17 gram/dose powder, Mix (17 g) in 4-8 ounces of liquid and drink it by mouth daily., Disp: 510 g, Rfl: 0    polyethylene glycol (MIRALAX ) 17 gram packet, Take 17 g by mouth daily., Disp: 30 packet, Rfl: 2    secukinumab  (COSENTYX  PEN, 2 PENS,) 150 mg/mL PnIj injection, Inject the contents of 2 pens (300 mg total) under the skin every twenty-eight (28) days. Maintenance dose., Disp: 6 mL, Rfl: 3    senna (SENOKOT) 8.6 mg tablet, Take 2 tablets by mouth daily., Disp: 60 tablet, Rfl: 2    senna-docusate (PERICOLACE) 8.6-50 mg, Take 2 tablets by mouth daily., Disp: 60 tablet, Rfl: 2    sevelamer  (RENVELA ) 800 mg tablet, Take 2 tablets (1,600  mg total) by mouth Three (3) times a day with a meal., Disp: 180 tablet, Rfl: 2    sevelamer  (RENVELA ) 800 mg tablet, Take 2 tablets (1,600 mg total) by mouth Three (3) times a day with a meal., Disp: 180 tablet, Rfl: 2    tizanidine  (ZANAFLEX ) 2 MG tablet, Take 1 tablet (2 mg total) by mouth daily as needed., Disp: 90 tablet, Rfl: 0    triamcinolone  (KENALOG ) 0.1 % ointment, Apply topically two (2) times a day as needed. Use on psoriasis until flat., Disp: 80 g, Rfl: 1    ALLERGIES:   Cephalexin, Mango, Peach (prunus persica), Wellbutrin [bupropion hcl], Egg, Fish containing products, Lactase-rennet, Lexiscan  [regadenoson ], and Mushroom    SOCIAL HISTORY:   Social History     Tobacco Use    Smoking status: Former     Current packs/day: 0.00     Average packs/day: 2.0 packs/day for 1 year (2.0 ttl pk-yrs)     Types: Cigarettes     Start date: 06/30/2009     Quit date: 06/30/2010     Years since quitting: 12.9    Smokeless tobacco: Never   Substance Use Topics    Alcohol use: Not Currently       FAMILY HISTORY:  Family History   Adopted: Yes   Problem Relation Age of Onset    Heart attack Mother 8    Alcohol abuse Mother     Mental illness Mother     No Known Problems Father     No Known Problems Sister     No Known Problems Daughter     No Known Problems Maternal Grandmother     No Known Problems Maternal Grandfather     No Known Problems Paternal Grandmother     No Known Problems Paternal Grandfather     No Known Problems Brother     No Known Problems Son     No Known Problems Maternal Aunt     No Known Problems Maternal Uncle     No Known Problems Paternal Aunt     No Known Problems Paternal Uncle     No Known Problems Other     Anesthesia problems Neg Hx     Broken bones Neg Hx     Cancer Neg Hx     Clotting disorder Neg Hx     Collagen disease Neg Hx     Diabetes Neg Hx     Dislocations Neg Hx     Fibromyalgia Neg Hx     Gout Neg Hx     Hemophilia Neg Hx     Osteoporosis Neg Hx     Rheumatologic disease Neg Hx     Scoliosis Neg Hx     Severe sprains Neg Hx     Sickle cell anemia Neg Hx     Spinal Compression Fracture Neg Hx     Melanoma Neg Hx     Basal cell carcinoma Neg Hx     Squamous cell carcinoma Neg Hx     Breast cancer Neg Hx           Review of Systems    A 10 point review of systems was performed and is negative other than positive elements noted in HPI     Radiology     No orders to display       Labs     Labs Reviewed   COMPREHENSIVE METABOLIC PANEL - Abnormal; Notable for the following components:  Result Value    Potassium 2.9 (*)     BUN 34 (*)     Creatinine 13.40 (*)     eGFR CKD-EPI (2021) Female 3 (*)     Calcium  7.3 (*)     Albumin 3.1 (*)     Total Bilirubin 0.2 (*)     All other components within normal limits   CBC W/ AUTO DIFF - Abnormal; Notable for the following components:    WBC 13.1 (*)     RBC 2.94 (*)     HGB 8.6 (*)     HCT 26.9 (*)     Absolute Neutrophils 10.5 (*)     Absolute Monocytes 0.9 (*)     All other components within normal limits   CBC W/ DIFFERENTIAL    Narrative:     The following orders were created for panel order CBC w/ Differential.                  Procedure                               Abnormality         Status                                     ---------                               -----------         ------                                     CBC w/ Differential[[id number]]         Abnormal            Final result                                                 Please view results for these tests on the individual orders.   EXTRA TUBES    Narrative:     The following orders were created for panel order Extra Lab Tubes.                  Procedure                               Abnormality         Status                                     ---------                               -----------         ------  GREEN LITHIUM HEPARIN  E.SABRASABRA[[id number]]                      In process                                 LAVENDER EDTA EXTRA ULAZ[[id number]]                        In process                                 LIGHT BLUE CITRATE EXTR.SABRASABRA[[id number]]                      In process                                 PINK EXTRA ULAZ[[id number]]                                 In process                                                   Please view results for these tests on the individual orders.   GREEN LITHIUM HEPARIN  EXTRA TUBE   LAVENDER EDTA EXTRA TUBE   LIGHT BLUE CITRATE EXTRA TUBE   PINK EXTRA TUBE       Please note- This chart has been created using [employer]. Chart creation errors have been sought, but may not always be located and such creation errors, especially pronoun confusion, do NOT reflect on the standard of medical care.     Donnie Vannie NOVAK, MD  Resident  06/23/23 [phone]

## 2023-06-23 NOTE — Unmapped (Signed)
Pt BIB [facility] from dialysis for c/o hypotension post dialysis. Had full tx - initial B/P 60 systolic. Now 90s systolic. A&O x 4.

## 2023-06-23 NOTE — Unmapped (Signed)
Nephrology ([facility]) Progress Note    Assessment & Plan:   Megan Robertson is a 52 y.o. 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) that presented to [facility] with hypotension, electrolyte derangement     Principal Problem:    Hypotension  Active Problems:    Hypertension    Leukocytosis    Anemia    Hypokalemia    ESRD (end stage renal disease) on dialysis (CMS-HCC)    Hyperphosphatemia    Poor fluid intake    Hypovolemia    Dysgeusia    Fatigue    Dysuria    Physical deconditioning    Malnutrition (CMS-HCC)        Active Problems    Hypotension (improved) - Poor PO Intake, Altered Taste - Fatigue - Dysuria, Leukocytosis   She was recently admitted for a similar presentation (hypotension, lethargy at that time). Blood pressure responded well to IVF in the ED and she also notes feeling a little better. Denies fevers but has been a little chilly at home. No cough or URI symptoms. Does note dysuria but only producing very small amounts of urine (less than usual). She has been taking midodrine at home (though she does not know her meds well and husband not present to confirm). Suspect her symptoms are due to hypovolemia iso poor PO intake which was also an issue during her last admission. Will cover her empirically for UTI with CTX given her reported dysuria as well as mild leukocytosis, though I have a lower suspicion that sepsis is the cause of her hypotension. Also checking for nutritional deficiencies and consult nutritionist.   - Continue CTX  - Cell count low concern for infection, gram stain negative. follow up PD fluid culture   - PD prescription change as below  - Follow up urine culture, blood cultures   - Folate, B12, zinc levels   - CTM Blood pressure - small boluses as needed  - Hold bowel reg given reported frequent BMs - 3-4 for last few days, none in hospital.   - Nutrition consult   - Daily CBC      ESRD on PD  During last admission, increased time of PD to 12-18 hours per pt due to concern for possible uremia contributing to her poor PO intake and lethargy. Her symptoms had slightly improved by time of discharge despite little improvement in BUN. She continues to report dysgeusia though BUN only 34 on admission. She has an elevation in creatinine and phosphorus and concern for symptomatic uremia. We will trial altering her PD prescription to increase PD volume and time to help with filtration. Counseled patient to consider HD if continues to have symptoms with inadequate PD.   -PD today  -Increase PD volume and add daytime fluid. Increase to 2.5L (she had 2.2 last admit ; going from 4 to 5 exchanges   -Electrolytes as below.      Hypokalemia - Hypocalcemia - Muscle Cramping  Hypokalemic to 2.9 at admission improved to 3.5 9/1. Only mild hypocalcemia but potentially could be contributing to her reported facial cramping. Address hyperphos as below. Cramping resolved 9/1 am with repletion.  - Replete K PRN  - Continue home calcitriol      Hyperphosphatemia - Secondary Hyperparathyroidism  Phosphorus increased to 8.1.  Increase home sevelamer - 2400mg  TID with meals.     Deconditioning - Malnutrition  - PT/OT  - Nutrition consult  Normocytic Anemia  Hgb 10.1, improved from prior but may reflect hemoconcentration iso hypovolemia.   - Iron panel, ferritin  - B12, folate  - Zinc level given altered taste       Chronic Conditions  Hx Clotted AVF: continue home eliquis         Issues Impacting Complexity of Management:  The patient's presentation is complicated by the following clinically significant conditions requiring additional evaluation and treatment: - Hypercoagulable state requiring additional attention to DVT prophylaxis and treatment  - Chronic kidney disease POA requiring further investigation, treatment, or monitoring   - Hypokalemia POA requiring further investigation, treatment, or monitoring  - Dehydration POA requiring further investigation, treatment, or monitoring  - Volume depletion POA requiring further investigation, treatment, or monitoring  - Hypotension POA requiring further investigation, treatment, or monitoring       Daily Checklist:  Diet: Regular Diet  DVT PPx: [name]  Electrolytes: Potassium Repleted  Code Status: Full Code  Dispo: Admit to floor    Team Contact Information:   Primary Team: Nephrology ([facility])  Primary Resident: Luvenia Heller, MD, MD  Resident's Pager: [phone] (Nephrology Intern - Tower)    Interval History:   No acute events overnight.    ROS: Denies headache, chest pain, shortness of breath, abdominal pain, nausea, vomiting.    Objective:   Temp:  [36.5 ??C (97.7 ??F)-36.8 ??C (98.2 ??F)] 36.8 ??C (98.2 ??F)  Heart Rate:  [83-102] 98  SpO2 Pulse:  [83-98] 91  Resp:  [16-28] 18  BP: (83-115)/(49-77) 106/49  SpO2:  [97 %-100 %] 98 %,   Intake/Output Summary (Last 24 hours) at 06/23/2023 1312  Last data filed at 06/22/2023 1813  Gross per 24 hour   Intake 800 ml   Output --   Net 800 ml       Gen: NAD, converses   HENT: atraumatic, normocephalic  Heart: RRR  Lungs: CTAB, no crackles or wheezes  Abdomen: soft, NTND  Extremities: No edema

## 2023-06-23 NOTE — Unmapped (Signed)
Bed: 81-D  Expected date:   Expected time:   Means of arrival:   Comments:  Rm 24 Gardiner Ramus

## 2023-06-23 NOTE — Unmapped (Signed)
Bed: 24-B  Expected date:   Expected time:   Means of arrival:   Comments:  EMS

## 2023-06-23 NOTE — Unmapped (Signed)
Nephrology ([facility]) History & Physical    Assessment & Plan:   Megan Robertson is a 52 y.o. 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     Principal Problem:    Hypotension  Active Problems:    Hypertension    Leukocytosis    Anemia    Hypokalemia    ESRD (end stage renal disease) on dialysis (CMS-HCC)    Hyperphosphatemia    Poor fluid intake    Hypovolemia    Dysgeusia    Fatigue    Dysuria    Physical deconditioning    Malnutrition (CMS-HCC)      Active Problems    Hypotension (resolved) - Poor PO Intake, Altered Taste - Fatigue - Dysuria, Leukocytosis   She was recently admitted for a similar presentation (hypotension, lethargy at that time). Blood pressure responded well to IVF in the ED and she also notes feeling a little better. Denies fevers but has been a little chilly at home. No cough or URI symptoms. Does note dysuria but only producing very small amounts of urine (less than usual). She has been taking midodrine at home (though she does not know her meds well and husband not present to confirm). Suspect her symptoms are due to hypovolemia iso very poor PO intake which was also a major issue during her last admission. Will cover her empirically for UTI with CTX given her reported dysuria as well as mild leukocytosis, though I have a lower suspicion that sepsis is the cause of her hypotension.   - CTX  - Cell count, follow up PD fluid culture   - Follow up urine culture, blood cultures   - Folate, B12, zinc levels   - Repeating IVF bolus given softer pressures  - Back off on bowel reg given reported frequent BMs   - Nutrition consult   - Daily CBC     ESRD on PD  During last admission, increased time of PD due to concern for possible uremia contributing to her poor PO intake and lethargy. Her symptoms had slightly improved by time of discharge despite little improvement in BUN. She continues to report dysgeusia though BUN only 34 on admission.   - Resume PD tomorrow     Hypokalemia - Hypocalcemia - Muscle Cramping  Only mild hypocalcemia but potentially could be contributing to her reported facial cramping. Address hyperphos as below.  - Replete K PRN  - Continue home calcitriol     Hyperphosphatemia - Secondary Hyperparathyroidism  Increase home sevelamer.     Deconditioning - Malnutrition  - PT/OT  - Nutrition consult    Normocytic Anemia  Hgb 10.1, improved from prior but may reflect hemoconcentration iso hypovolemia.   - Iron panel, ferritin  - B12, folate  - Zinc level given altered taste      Chronic Conditions  Hx Clotted AVF: continue home eliquis     The patient's presentation is complicated by the following clinically significant conditions requiring additional evaluation and treatment: - Hypercoagulable state requiring additional attention to DVT prophylaxis and treatment  - Chronic kidney disease POA requiring further investigation, treatment, or monitoring   - Hypokalemia POA requiring further investigation, treatment, or monitoring  - Dehydration POA requiring further investigation, treatment, or monitoring  - Volume depletion POA requiring further investigation, treatment, or monitoring  - Hypotension POA requiring further investigation, treatment, or monitoring  Issues Impacting Complexity of Management:  -High risk of complications from pain and/or analgesia likely to result in delirium      Checklist:  Diet: Regular Diet  DVT PPx: [name]  Code Status: Full Code  Dispo: Patient appropriate for Inpatient based on expectation of ongoing need for hospitalization greater than two midnights based on severity of presentation/services including IV hydration, dialysis    Team Contact Information:   Primary Team: Nephrology (MEDB)  Primary Resident: Achille Rich, MD  Resident's Pager: [phone] (Nephrology Senior Resident)    Chief Concern:   Hypotension    Subjective:   Megan Robertson is a 52 y.o. 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     History obtained from patient.     HPI:    She was recently admitted with hypotension and lethargy, thought potentially secondary to uremia from inadequate PD. Seems to have been doing well until the last few days, when she developed hypotension, lethargy, altered taste, muscle cramps, and poor PO intake. Denies fevers but has been feeling more chilly. Also notes reduced urine output with dysuria.     She was afebrile on arrival to the ED with softer BPs (83/55). Received 500cc IVF. Reports feeling somewhat better after receiving IVF.      Pertinent Surgical Hx  Past Surgical History:   Procedure Laterality Date    APPENDECTOMY      BREAST EXCISIONAL BIOPSY Right 11/28/2016    CHEMOTHERAPY      for ovarian CA in 2000    CHG US GUIDE, VASCULAR ACCESS N/A 01/29/2020    Procedure: ULTRASOUND GUIDANCE FOR VASC ACCESS REQUIRING [place] EVAL OF POTENTIAL ACCESS SITES;  Surgeon: Leona Carry, MD;  Location: MAIN OR [facility];  Service: Transplant    CHG US GUIDE, VASCULAR ACCESS N/A 06/08/2020    Procedure: ULTRASOUND GUIDANCE FOR VASC ACCESS REQUIRING [place] EVAL OF POTENTIAL ACCESS SITES;  Surgeon: Leona Carry, MD;  Location: MAIN [facility];  Service: Transplant    CHOLECYSTECTOMY      COMBINED HYSTEROSCOPY DIAGNOSTIC / D&C  07/07/2015    Planned for endometrial ablation, but patient had uterine perforation after D&C and unable to perform    HYSTERECTOMY      LEFT OOPHORECTOMY Left 04/12/2000    Removed with L fallopian tube for ectopic pregnancy    OOPHORECTOMY      PR COLONOSCOPY W/BIOPSY SINGLE/MULTIPLE  09/12/2021    Procedure: COLONOSCOPY, FLEXIBLE, PROXIMAL TO SPLENIC FLEXURE; WITH BIOPSY, SINGLE OR MULTIPLE;  Surgeon: Maris Berger, MD;  Location: [facility]URES [facility];  Service: Gastroenterology    PR COLSC FLX W/RMVL OF TUMOR POLYP LESION SNARE TQ N/A 09/12/2021    Procedure: COLONOSCOPY FLEX; W/REMOV TUMOR/LES BY SNARE;  Surgeon: Maris Berger, MD;  [facility]: [facility];  Service: Gastroenterology    PR CREAT AV FISTULA,AUTOGENOUS GRAFT Left 01/29/2020    Procedure: Create Av Fistula (Separt Proc); Autog Gft;  Surgeon: Leona Carry, MD;  Location: MAIN OR [facility];  Service: Transplant    PR CREAT AV FISTULA,NON-AUTOGENOUS GRAFT Left 06/08/2020    Procedure: CREATE AV FISTULA (SEPARATE PROC); NONAUTOGENOUS GRAFT (EG, BIOLOGICAL COLLAGEN, THERMOPLASTIC GRAFT);  Surgeon: Leona Carry, MD;  Location: MAIN OR [facility];  Service: Transplant    PR EXPLORATION N/FLWD SURG UPPER EXTREMITY ARTERY Left 08/02/2020  Procedure: Exploration Not Followed By Surgical Repair, Artery; Upper Extremity (Eg, Axillary, Brachial, Radial, Ulnar);  Surgeon: Earney Mallet, MD;  Location: MAIN OR [facility];  Service: Vascular    PR INCIS/DRAIN ARM,DEEP ABSC/HEMATOMA Left 08/14/2020    Procedure: INCISION AND DRAINAGE, UPPER ARM OR ELBOW AREA; DEEP ABSCESS OR HEMATOMA;  Surgeon: Earney Mallet, MD;  Location: MAIN OR [place];  Service: Vascular    PR INSERTION TUNNEL INTRAPERITONEAL CATH DIAL OPEN Midline 02/01/2021    Procedure: INSERTION OF INTRAPERITONEAL CANNULA OR CATHETER FOR DRAINAGE OR DIALYSIS; PERMANENT;  Surgeon: Loney Hering, MD;  Location: MAIN OR [place];  Service: Transplant    PR REMV ART CLOT AXILL-BRACH,ARM INCIS Left 08/02/2020    Procedure: Embolect/Thrombec; Axilry/Brachial Art-Arm Incs;  Surgeon: Earney Mallet, MD;  Location: MAIN OR [facility];  Service: Vascular    PR UPPER GI ENDOSCOPY,BIOPSY N/A 08/29/2017    Procedure: UGI ENDOSCOPY; WITH BIOPSY, SINGLE OR MULTIPLE;  Surgeon: Neysa Hotter, MD;  Location: GI PROCEDURES [place] [facility];  Service: Gastroenterology    PR VEIN BYPASS GRAFT,SUBCL-BRACHIAL Left 08/05/2020    Procedure: Bypass Graft, With Vein; Subclavian-Brachial;  Surgeon: Earney Mallet, MD;  Location: MAIN OR [facility];  Service: Vascular    SALPINGECTOMY Left 04/12/2000    Ectopic pregnancy    SALPINGECTOMY Right 11/22/2015    at time of total vaginal hysterectomy    TONSILECTOMY, ADENOIDECTOMY, BILATERAL MYRINGOTOMY AND TUBES      TOTAL VAGINAL HYSTERECTOMY  11/22/2015    With R salpingectomy         Pertinent Family Hx  Family History   Adopted: Yes   Problem Relation Age of Onset    Heart attack Mother 78    Alcohol abuse Mother     Mental illness Mother     No Known Problems Father     No Known Problems Sister     No Known Problems Daughter     No Known Problems Maternal Grandmother     No Known Problems Maternal Grandfather     No Known Problems Paternal Grandmother     No Known Problems Paternal Grandfather     No Known Problems Brother     No Known Problems Son     No Known Problems Maternal Aunt     No Known Problems Maternal Uncle     No Known Problems Paternal Aunt     No Known Problems Paternal Uncle     No Known Problems Other     Anesthesia problems Neg Hx     Broken bones Neg Hx     Cancer Neg Hx     Clotting disorder Neg Hx     Collagen disease Neg Hx     Diabetes Neg Hx     Dislocations Neg Hx     Fibromyalgia Neg Hx     Gout Neg Hx     Hemophilia Neg Hx     Osteoporosis Neg Hx     Rheumatologic disease Neg Hx     Scoliosis Neg Hx     Severe sprains Neg Hx     Sickle cell anemia Neg Hx     Spinal Compression Fracture Neg Hx     Melanoma Neg Hx     Basal cell carcinoma Neg Hx     Squamous cell carcinoma Neg Hx     Breast cancer Neg Hx            Pertinent Social Hx   Lives at home with husband  who helps with ADLs.      Allergies  Cephalexin, Mango, Peach (prunus persica), Wellbutrin [bupropion hcl], Egg, Fish containing products, Lactase-rennet, Lexiscan [regadenoson], and Mushroom    I was NOT able to review the Medication List with the patient or a representative. Further medication reconciliation is needed. Patient does not know her medications well and spouse not present.  Prior to Admission medications    Medication Dose, Route, Frequency   albuterol HFA 90 mcg/actuation inhaler 2 puffs, Inhalation, Every 6 hours PRN   apixaban (ELIQUIS) 2.5 mg Tab 2.5 mg, Oral, 2 times a day (standard)   apixaban (ELIQUIS) 2.5 mg Tab 2.5 mg, Oral, 2 times a day (standard)   aspirin 81 MG chewable tablet 81 mg, Oral, Daily (standard)   atorvastatin (LIPITOR) 80 MG tablet 80 mg, Oral, Daily (standard)   beclomethasone dipropionate (QVAR REDIHALER) 80 mcg/actuation inhaler 1 puff, Inhalation, 2 times daily (RT)   calcitriol (ROCALTROL) 0.5 MCG capsule 0.5 mcg, Oral, Daily (standard)   calcitriol (ROCALTROL) 0.5 MCG capsule 0.5 mcg, Oral, Daily (standard)   fluocinolone 0.01 % external oil Topical, Daily PRN, Use for psoriasis and scalp scaling.   furosemide (LASIX) 40 MG tablet Take 1 tablet up to 3 times weekly as needed if you develop leg swelling.   furosemide (LASIX) 40 MG tablet Take 1 tablet up to 3 times weekly as needed for leg swelling.   midodrine (PROAMATINE) 5 MG tablet 5 mg, Oral, 2 times a day PRN   midodrine (PROAMATINE) 5 MG tablet 5 mg, Oral, 2 times a day PRN, (Take 1 tab after PD. Please check your BP each afternoon, take 1 tab for systolic pressure less than 90)   nystatin (MYCOSTATIN) 100,000 unit/gram powder Topical, 2 times a day (standard), Apply under breasts 2 times daily until resolution   nystatin (MYCOSTATIN) 100,000 unit/gram powder Apply under breasts two times daily until resolution   ondansetron (ZOFRAN) 4 MG tablet 4 mg, Oral, Every 6 hours   polyethylene glycol (GLYCOLAX) 17 gram/dose powder Mix (17 g) in 4-8 ounces of liquid and drink it by mouth daily.   polyethylene glycol (MIRALAX) 17 gram packet 17 g, Oral, Daily (standard)   secukinumab (COSENTYX PEN, 2 PENS,) 150 mg/mL PnIj injection Inject the contents of 2 pens (300 mg total) under the skin every twenty-eight (28) days. Maintenance dose.   senna (SENOKOT) 8.6 mg tablet 2 tablets, Oral, Daily (standard)   senna-docusate (PERICOLACE) 8.6-50 mg 2 tablets, Oral, Daily (standard)   sevelamer (RENVELA) 800 mg tablet 1,600 mg, Oral, 3 times a day (with meals)   sevelamer (RENVELA) 800 mg tablet 1,600 mg, Oral, 3 times a day (with meals)   tizanidine (ZANAFLEX) 2 MG tablet 2 mg, Oral, Daily PRN   triamcinolone (KENALOG) 0.1 % ointment Topical, 2 times a day PRN, Use on psoriasis until flat.       Designated Healthcare Decision Maker:  Megan Robertson currently has decisional capacity for healthcare decision-making and is able to designate a surrogate healthcare decision maker. Megan Robertson designated healthcare decision maker(s) is/are Justis Aten (the patient's spouse) as denoted by stated patient preference.    Objective:   Physical Exam:  Temp:  [36.5 ??C (97.7 ??F)-36.6 ??C (97.9 ??F)] 36.6 ??C (97.9 ??F)  Heart Rate:  [83-102] 84  SpO2 Pulse:  [83-98] 91  Resp:  [16-28] 19  BP: (83-115)/(51-77) 96/53  SpO2:  [97 %-100 %] 99 %    Gen: NAD, converses   Eyes: Sclera anicteric, EOMI grossly  normal   HENT: Atraumatic, normocephalic  Neck: Trachea midline  Heart: RRR  Lungs: CTAB, no crackles or wheezes  Abdomen: Soft, non-tender, no guarding. PD catheter site covered with gauze, c/d/i  Extremities: No edema   Neuro: Grossly symmetric, non-focal    Skin:  No rashes, lesions on clothed exam  Psych: Alert, oriented

## 2023-06-23 NOTE — Unmapped (Signed)
Also reporting full body cramping. On [place], compliant. EMS states recent admission to ICU on pressors for same.

## 2023-06-24 LAB — CBC W/ AUTO DIFF
BASOPHILS ABSOLUTE COUNT: 0.1 10*9/L (ref 0.0–0.1)
BASOPHILS RELATIVE PERCENT: 0.9 %
EOSINOPHILS ABSOLUTE COUNT: 0.7 10*9/L — ABNORMAL HIGH (ref 0.0–0.5)
EOSINOPHILS RELATIVE PERCENT: 5.2 %
HEMATOCRIT: 30.1 % — ABNORMAL LOW (ref 34.0–44.0)
HEMOGLOBIN: 9.8 g/dL — ABNORMAL LOW (ref 11.3–14.9)
LYMPHOCYTES ABSOLUTE COUNT: 2.3 10*9/L (ref 1.1–3.6)
LYMPHOCYTES RELATIVE PERCENT: 18.3 %
MEAN CORPUSCULAR HEMOGLOBIN CONC: 32.5 g/dL (ref 32.0–36.0)
MEAN CORPUSCULAR HEMOGLOBIN: 29.5 pg (ref 25.9–32.4)
MEAN CORPUSCULAR VOLUME: 90.7 fL (ref 77.6–95.7)
MEAN PLATELET VOLUME: 7.5 fL (ref 6.8–10.7)
MONOCYTES ABSOLUTE COUNT: 0.7 10*9/L (ref 0.3–0.8)
MONOCYTES RELATIVE PERCENT: 5.2 %
NEUTROPHILS ABSOLUTE COUNT: 8.9 10*9/L — ABNORMAL HIGH (ref 1.8–7.8)
NEUTROPHILS RELATIVE PERCENT: 70.4 %
PLATELET COUNT: 342 10*9/L (ref 150–450)
RED BLOOD CELL COUNT: 3.32 10*12/L — ABNORMAL LOW (ref 3.95–5.13)
RED CELL DISTRIBUTION WIDTH: 13.6 % (ref 12.2–15.2)
WBC ADJUSTED: 12.7 10*9/L — ABNORMAL HIGH (ref 3.6–11.2)

## 2023-06-24 LAB — RENAL FUNCTION PANEL
ALBUMIN: 2.3 g/dL — ABNORMAL LOW (ref 3.4–5.0)
ALBUMIN: 2.9 g/dL — ABNORMAL LOW (ref 3.4–5.0)
ANION GAP: 14 mmol/L (ref 5–14)
ANION GAP: 14 mmol/L (ref 5–14)
BLOOD UREA NITROGEN: 42 mg/dL — ABNORMAL HIGH (ref 9–23)
BUN / CREAT RATIO: 3
CALCIUM: 8.1 mg/dL — ABNORMAL LOW (ref 8.7–10.4)
CALCIUM: 8.3 mg/dL — ABNORMAL LOW (ref 8.7–10.4)
CHLORIDE: 106 mmol/L (ref 98–107)
CHLORIDE: 103 mmol/L (ref 98–107)
CO2: 17 mmol/L — ABNORMAL LOW (ref 20.0–31.0)
CO2: 25 mmol/L (ref 20.0–31.0)
CREATININE: 13.99 mg/dL — ABNORMAL HIGH
EGFR CKD-EPI (2021) FEMALE: 3 mL/min/{1.73_m2} — ABNORMAL LOW (ref >=60)
GLUCOSE RANDOM: 90 mg/dL (ref 70–179)
GLUCOSE RANDOM: 86 mg/dL (ref 70–179)
PHOSPHORUS: 7.9 mg/dL — ABNORMAL HIGH (ref 2.4–5.1)
POTASSIUM: 3.8 mmol/L (ref 3.4–4.8)
SODIUM: 137 mmol/L (ref 135–145)
SODIUM: 142 mmol/L (ref 135–145)

## 2023-06-24 LAB — MAGNESIUM: MAGNESIUM: 1.5 mg/dL — ABNORMAL LOW (ref 1.6–2.6)

## 2023-06-24 LAB — TSH: THYROID STIMULATING HORMONE: 1.953 u[IU]/mL (ref 0.550–4.780)

## 2023-06-24 MED ADMIN — magnesium oxide (MAG-OX) tablet 400 mg: 400 mg | ORAL | @ 22:00:00 | Stop: 2023-06-24

## 2023-06-24 MED ADMIN — dianeal - dextrose 1.5% and calcium 3.5 5,000 mL infusion: INTRAPERITONEAL

## 2023-06-24 MED ADMIN — sevelamer (RENVELA) tablet 2,400 mg: 2400 mg | ORAL | @ 18:00:00

## 2023-06-24 MED ADMIN — lactated Ringers infusion: 75 mL/h | INTRAVENOUS | @ 21:00:00 | Stop: 2023-06-24

## 2023-06-24 MED ADMIN — lactated ringers bolus 500 mL: 500 mL | INTRAVENOUS | @ 12:00:00 | Stop: 2023-06-24

## 2023-06-24 MED ADMIN — fluticasone furoate (ARNUITY ELLIPTA) 100 mcg/actuation inhaler 1 puff: 1 | RESPIRATORY_TRACT | @ 12:00:00

## 2023-06-24 MED ADMIN — hydrocortisone 2.5 % cream: TOPICAL | @ 01:00:00

## 2023-06-24 MED ADMIN — aspirin chewable tablet 81 mg: 81 mg | ORAL | @ 13:00:00

## 2023-06-24 MED ADMIN — nystatin (MYCOSTATIN) powder: TOPICAL | @ 01:00:00

## 2023-06-24 MED ADMIN — apixaban (ELIQUIS) tablet 2.5 mg: 2.5 mg | ORAL | @ 13:00:00

## 2023-06-24 MED ADMIN — hydrocortisone 2.5 % cream: TOPICAL | @ 13:00:00

## 2023-06-24 MED ADMIN — sevelamer (RENVELA) tablet 2,400 mg: 2400 mg | ORAL | @ 22:00:00

## 2023-06-24 MED ADMIN — cefTRIAXone (ROCEPHIN) 1 g in sodium chloride 0.9 % (NS) 100 mL IVPB-MBP: 1 g | INTRAVENOUS | @ 09:00:00 | Stop: 2023-06-28

## 2023-06-24 MED ADMIN — calcitriol (ROCALTROL) capsule 0.5 mcg: .5 ug | ORAL | @ 13:00:00

## 2023-06-24 MED ADMIN — sevelamer (RENVELA) tablet 2,400 mg: 2400 mg | ORAL | @ 13:00:00

## 2023-06-24 MED ADMIN — lactated ringers bolus 500 mL: 500 mL | INTRAVENOUS | @ 18:00:00 | Stop: 2023-06-24

## 2023-06-24 MED ADMIN — apixaban (ELIQUIS) tablet 2.5 mg: 2.5 mg | ORAL | @ 01:00:00

## 2023-06-24 MED ADMIN — midodrine (PROAMATINE) tablet 5 mg: 5 mg | ORAL | @ 22:00:00 | Stop: 2023-06-24

## 2023-06-24 MED ADMIN — nystatin (MYCOSTATIN) powder: TOPICAL | @ 13:00:00

## 2023-06-24 NOTE — Unmapped (Signed)
Cramping to RLE, pt stretched leg and range of motion. Denies pain when asked. PD overnight. Bed in low locked position. Call light within reach.     Problem: Adult Inpatient Plan of Care  Goal: Plan of Care Review  Outcome: Progressing  Goal: Patient-Specific Goal (Individualized)  Outcome: Progressing  Goal: Absence of Hospital-Acquired Illness or Injury  Outcome: Progressing  Intervention: Identify and Manage Fall Risk  Recent Flowsheet Documentation  Taken 06/23/2023 2047 by Gerry Heaphy, RN  Safety Interventions:   fall reduction program maintained   lighting adjusted for tasks/safety   low bed   environmental modification   nonskid shoes/slippers when out of bed  Goal: Optimal Comfort and Wellbeing  Outcome: Progressing  Goal: Readiness for Transition of Care  Outcome: Progressing  Goal: Rounds/Family Conference  Outcome: Progressing     Problem: Fall Injury Risk  Goal: Absence of Fall and Fall-Related Injury  Outcome: Progressing  Intervention: Promote Scientist, clinical (histocompatibility and immunogenetics) Documentation  Taken 06/23/2023 2047 by [employer], Cleofas Hudgins, RN  Safety Interventions:   fall reduction program maintained   lighting adjusted for tasks/safety   low bed   environmental modification   nonskid shoes/slippers when out of bed     Problem: Peritoneal Dialysis  Goal: Optimize Fluid and Electrolyte Balance  Outcome: Progressing  Goal: Absence of Infection Signs and Symptoms  Outcome: Progressing  Goal: Safe, Effective Therapy Delivery  Outcome: Progressing     Problem: Wound  Goal: Optimal Coping  Outcome: Progressing  Goal: Optimal Functional Ability  Outcome: Progressing  Goal: Absence of Infection Signs and Symptoms  Outcome: Progressing  Goal: Improved Oral Intake  Outcome: Progressing  Goal: Optimal Pain Control and Function  Outcome: Progressing  Goal: Skin Health and Integrity  Outcome: Progressing  Intervention: Optimize Skin Protection  Recent Flowsheet Documentation  Taken 06/23/2023 2047 by Che Below, RN  Head of Bed (HOB) Positioning: HOB elevated  Goal: Optimal Wound Healing  Outcome: Progressing     Problem: Electrolyte Imbalance  Goal: Electrolyte Balance  Outcome: Progressing

## 2023-06-24 NOTE — Unmapped (Addendum)
Outpatient follow up:  [ ]  follow up with Nephrology for PD management, consideration for switch to HD if not done inpatient  [ ]  follow up Potassium, has been hypokalemic and will likely need daily supplementation    New Consult: FINLEE CONCEPCION [phone] [id number] 52 y.o.   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 Consult is for placement of a tunneled line for long-term dialysis. Thanks! Suzie Portela, MD - [phone] - Met Team B - Tower Intern, available by pager or Epic Chat      DOLOREZ JEFFREY [phone] [id number] 52 y.o. discharging home today and in need for meds to beds! Thank you, Suzie Portela, MD - [phone]              ZOYE CHANDRA is a 52 y.o. 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     Hypotension (improved) - Poor PO Intake, Altered Taste - Fatigue - Dysuria, Leukocytosis   She was recently admitted for a similar presentation (hypotension, lethargy at that time). Blood pressure responded well to IVF in the ED and she also notes feeling a little better. Denies fevers but has been a little chilly at home. No cough or URI symptoms. Does note dysuria but only producing very small amounts of urine (less than usual). She has been taking midodrine at home (though she does not know her meds well and husband not present to confirm). Suspect her symptoms are due to hypovolemia iso poor PO intake which was also an issue during her last admission. Covered empirically for UTI with CTX given her reported dysuria as well as mild leukocytosis, though I have a lower suspicion that sepsis is the cause of her hypotension. Unable to collect urine sample as she is largely anuric. Also checked for nutritional deficiencies and consulted nutritionist.        ESRD on PD  During last admission, increased time of PD to 12-18 hours per pt due to concern for possible uremia contributing to her poor PO intake and lethargy. Her symptoms had slightly improved by time of discharge despite little improvement in BUN. She continues to report dysgeusia though BUN only 34 on admission. She has an elevation in creatinine and phosphorus and concern for symptomatic uremia. We will trial altering her PD prescription to increase PD volume and time to help with filtration. Counseled patient to consider HD if continues to have symptoms with inadequate PD.     Hypokalemia - Hypocalcemia - Muscle Cramping  Hypokalemic to 2.9 at admission improved to 3.5 9/1. Only mild hypocalcemia but potentially could be contributing to her reported facial cramping. Address hyperphos as below. Cramping resolved 9/1 am with repletion.       Hyperphosphatemia - Secondary Hyperparathyroidism  Phosphorus increased to 8.1.Increased home sevelamer - 2400mg  TID with meals.     Deconditioning - Malnutrition  - PT/OT  - Nutrition consult     Normocytic Anemia  Hgb 10.1, improved from prior but may reflect hemoconcentration iso hypovolemia.   - Iron panel, ferritin  - B12, folate  - Zinc level given altered taste       Chronic Conditions  Hx Clotted AVF: continue home eliquis

## 2023-06-24 NOTE — Unmapped (Signed)
Adult Nutrition Assessment Note    Visit Type: MD Consult  Reason for Visit: Assessment (Nutrition), Per Admission Nutrition Screen (Adult)    HPI & PMH:  Megan Robertson is a 52 y.o. 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     Anthropometric Data:  Height: 157.2 cm (5' 1.89)   Admission weight: 80.2 kg (176 lb 12.9 oz)  Last recorded weight: 81.1 kg (178 lb 10.9 oz)  IBW: 49.72 kg  Percent IBW: 161.3 %  BMI: Body mass index is 32.8 kg/m??.   Usual Body Weight:  Dry weight of 83.x lb per patient report    Weight history prior to admission: Patient reports having fluid-related weight fluctuations. Patient endorses taking lasix at home.   Wt Readings from Last 10 Encounters:   06/23/23 81.1 kg (178 lb 10.9 oz)   06/08/23 89.2 kg (196 lb 10.4 oz)   01/15/23 83.3 kg (183 lb 9.6 oz)   12/19/22 83.5 kg (184 lb)   11/22/22 83.3 kg (183 lb 9.6 oz)   11/21/22 84 kg (185 lb 1.6 oz)   11/07/22 82.9 kg (182 lb 11.2 oz)   10/19/22 83.9 kg (185 lb)   09/17/22 83.4 kg (183 lb 13.8 oz)   09/06/22 82.2 kg (181 lb 5.3 oz)        Weight changes this admission:   Last 5 Recorded Weights    06/23/23 0256 06/23/23 1752   Weight: 80.2 kg (176 lb 12.9 oz) 81.1 kg (178 lb 10.9 oz)        Nutrition Focused Physical Exam:  Unable to complete at this time due to patient being on phone      NUTRITIONALLY RELEVANT DATA     Medications:   Nutritionally pertinent medications reviewed and evaluated for potential food and/or medication interactions. Calcitriol, sevelamer     Labs:   Nutritionally pertinent labs reviewed and include Phosphorus: 8.1 mg/dL (16/10/96)   Zinc pending    Nutrition History:   June 24, 2023: Patient recently admitted to hospital from 06/03/23-06/10/23. Patient reports her PO intake improving a bit during recent admission, however not returning to normal yet. Endorses eating 2 meals per day at home. Endorses nausea with pain of extremities. Patient noted to have developed altered taste per chart. Does not endorse vomiting, constipation, diarrhea, abdominal pain, or chewing/swallowing difficulty.  Patient reports currently eating okay. Documented meal intake of 75% last night. Patient agreeable to trying oral nutrition supplement.     June 05, 2023: Prior to admission: Patient reports eating less than usual (1 meal/day compared to her usual 2 meals/day) for 3 days PTA. Endorses decreased appetite, nausea, and diarrhea. Endorses swallowing difficulty and feeling like foods are getting stuck in her throat but swallowing okay when eating slowly. Does not endorse vomiting, abdominal pain, chewing difficulty. During admission: Patient endorses consuming 100% of breakfast (rice krispies cereal, whole milk, bacon, orange juice ordered) this morning. Patient noted to have consumed 100% of lunch (roasted [place] with gravy, mashed potatoes and poultry gravy, dinner roll, sweet tea ordered). Documented meal intakes: 75/100/100/100%. Patient does not endorse nausea or diarrhea. Patient states she has been swallowing without issue as she has been doing it slowly.     Allergies, Intolerances, Sensitivities, and/or Cultural/Religious Dietary Restrictions:  mango, peach, egg, fish containing products, mushroom per chart    Current Nutrition:  Oral intake  Nutrition Orders            Nutrition Therapy Regular/House starting at 09/01 1158            Nutritional Needs:   Healthy balance of carbohydrate, protein, and fat.       Malnutrition assessment not yet completed at this time due to inability to complete nutrition focused physical exam (NFPE).    GOALS and EVALUATION     Patient to consume 75% or greater of po intake via combination of meals, snacks, and/or oral supplements within admission.  - New    Motivation, Barriers, and Compliance:  Evaluation of motivation, barriers, and compliance completed and include decreased appetite, nausea, pain, altered taste    NUTRITION ASSESSMENT     Current nutrition therapy is appropriate and progressing toward meeting meeting nutritional needs at this time.   Patient would benefit from start of oral supplement to better meet nutritional needs. Lower phosphorus oral supplement appropriate due to Phos 8.1  Hyperphosphatemia noted (06/23/23). Patient on phosphate binder.       Discharge Planning:   Monitor for potential discharge needs with multi-disciplinary team.     Was the nutrition care plan completed? No, unable to diagnose malnutrition at this time       NUTRITION INTERVENTIONS and RECOMMENDATION     Continue least restrictive diet as appropriate to promote increased PO intake.   Encourage and monitor PO intake and record % consumed in Epic.  Trial Nepro 1x/day.  Continue phosphate binder with meals/snacks.   Consider renal multivitamin.   Consider appetite stimulant.   Obtain zinc lab due to altered taste (currently pending).   Regular weight checks.     Follow-Up Parameters:   1-2 times per week (and more frequent as indicated)    Tobie Lords, MPH, RD, LDN  Pager # [phone]

## 2023-06-24 NOTE — Unmapped (Deleted)
Refused gabapentin. Denies pain when asked. Bed in low locked position. Call light within reach.    Problem: Adult Inpatient Plan of Care  Goal: Plan of Care Review  06/24/2023 0359 by Jaydi Bray, Leandro Reasoner, RN  Outcome: Progressing  06/24/2023 0329 by Kyona Chauncey, RN  Outcome: Progressing  Goal: Patient-Specific Goal (Individualized)  06/24/2023 0359 by Casimiro Needle, RN  Outcome: Progressing  06/24/2023 0329 by Arthelia Callicott, RN  Outcome: Progressing  Goal: Absence of Hospital-Acquired Illness or Injury  06/24/2023 0359 by Shonte Soderlund, RN  Outcome: Progressing  06/24/2023 0329 by Casimiro Needle, RN  Outcome: Progressing  Intervention: Identify and Manage Fall Risk  Recent Flowsheet Documentation  Taken 06/23/2023 2047 by Casimiro Needle, RN  Safety Interventions:   fall reduction program maintained   lighting adjusted for tasks/safety   low bed   environmental modification   nonskid shoes/slippers when out of bed  Goal: Optimal Comfort and Wellbeing  06/24/2023 0359 by Casimiro Needle, RN  Outcome: Progressing  06/24/2023 0329 by Casimiro Needle, RN  Outcome: Progressing  Goal: Readiness for Transition of Care  06/24/2023 0359 by Casimiro Needle, RN  Outcome: Progressing  06/24/2023 0329 by Casimiro Needle, RN  Outcome: Progressing  Goal: Rounds/Family Conference  06/24/2023 0359 by Casimiro Needle, RN  Outcome: Progressing  06/24/2023 0329 by Casimiro Needle, RN  Outcome: Progressing     Problem: Fall Injury Risk  Goal: Absence of Fall and Fall-Related Injury  06/24/2023 0359 by Latrece Nitta, RN  Outcome: Progressing  06/24/2023 0329 by Casimiro Needle, RN  Outcome: Progressing  Intervention: Promote Injury-Free Environment  Recent Flowsheet Documentation  Taken 06/23/2023 2047 by Casimiro Needle, RN  Safety Interventions:   fall reduction program maintained   lighting adjusted for tasks/safety   low bed   environmental modification   nonskid shoes/slippers when out of bed     Problem: Peritoneal Dialysis  Goal: Optimize Fluid and Electrolyte Balance  06/24/2023 0359 by Claiborne Stroble, RN  Outcome: Progressing  06/24/2023 0329 by Ambrosia Wisnewski, RN  Outcome: Progressing  Goal: Absence of Infection Signs and Symptoms  06/24/2023 0359 by Casimiro Needle, RN  Outcome: Progressing  06/24/2023 0329 by Casimiro Needle, RN  Outcome: Progressing  Goal: Safe, Effective Therapy Delivery  06/24/2023 0359 by Casimiro Needle, RN  Outcome: Progressing  06/24/2023 0329 by Sargun Rummell, RN  Outcome: Progressing     Problem: Wound  Goal: Optimal Coping  06/24/2023 0359 by Casimiro Needle, RN  Outcome: Progressing  06/24/2023 0329 by Briawna Carver, RN  Outcome: Progressing  Goal: Optimal Functional Ability  06/24/2023 0359 by Casimiro Needle, RN  Outcome: Progressing  06/24/2023 0329 by Deaven Barron, RN  Outcome: Progressing  Goal: Absence of Infection Signs and Symptoms  06/24/2023 0359 by Maryruth Apple, RN  Outcome: Progressing  06/24/2023 0329 by Temitope Griffing, RN  Outcome: Progressing  Goal: Improved Oral Intake  06/24/2023 0359 by Colie Josten, RN  Outcome: Progressing  06/24/2023 0329 by Nickolas Chalfin, RN  Outcome: Progressing  Goal: Optimal Pain Control and Function  06/24/2023 0359 by Casimiro Needle, RN  Outcome: Progressing  06/24/2023 0329 by Casimiro Needle, RN  Outcome: Progressing  Goal: Skin Health and Integrity  06/24/2023 0359 by Casimiro Needle, RN  Outcome: Progressing  06/24/2023 0329 by Casimiro Needle, RN  Outcome: Progressing  Intervention: Optimize Skin Protection  Recent Flowsheet Documentation  Taken 06/23/2023 2047 by Necola Bluestein, RN  Head of Bed [facility]) Positioning: HOB elevated  Goal: Optimal Wound Healing  06/24/2023 0359 by Casimiro Needle, RN  Outcome: Progressing  06/24/2023 0329 by Sayana Salley, RN  Outcome: Progressing     Problem: Electrolyte Imbalance  Goal: Electrolyte Balance  06/24/2023 0359 by Syla Devoss, RN  Outcome: Progressing  06/24/2023 0329 by Danner Paulding, RN  Outcome: Progressing

## 2023-06-24 NOTE — Unmapped (Signed)
PEDIATRIC PERITONEAL DIALYSIS TREATMENT NOTE    PROCEDURE DATE/TIME:    06/23/23 6:34 PM PD THERAPY DAY:  1 PD DEVICE:   ([id number])     THERAPY TYPE:  Continuous Cycling Peritoneal Dialysis - Standard     CONSENT:    Written consent was obtained prior to the procedure and is detailed in the medical record. Prior to the start of the procedure, a time out was taken and the identity of the patient was confirmed via name, medical record number and date of birth.    Active Dialysis Orders (168h ago, onward)       Start     Ordered    06/24/23 0600  Peritoneal Dialysis - CCPD Standard  Daily      Question Answer Comment   Therapy Time (hours) 12 hours    Total Number of Cycles 5    Exchange Volume (L) 2.5L    Day dwell/Last fill (mL) 1500    Dialysate last fill with bag as ordered        06/23/23 1238    06/23/23 0600  Peritoneal Dialysis - MANUAL EXCHANGE  Daily      Question Answer Comment   Exchange Single    Exchange Volume (L) 1L    Dwell time Other (please specify) 2 hours   Fill Time 15 minutes    Drain Time 15 minutes        06/22/23 2054                    VITAL SIGNS:  Vitals:    06/23/23 1752   BP: 104/73   Pulse: 99   Resp: 18   Temp: 37 ??C (98.6 ??F)   SpO2:     Vitals:    06/23/23 0256 06/23/23 1752   Weight: 80.2 kg (176 lb 12.9 oz) 81.1 kg (178 lb 10.9 oz)        LAB RESULTS:    Potassium   Date Value Ref Range Status   06/23/2023 3.5 3.4 - 4.8 mmol/L Final       DIALYSATE FLUID:  Dianeal Solution: Dextrose 1.5% Calcium 3.5 mEq/L Last Fill Dianeal Solution: Dextrose 1.5% Calcium 3.5 mEq/L   Additives:  None    ACCESS:  Peritoneal Dialysis Catheter Continuous cycling Left lower abdomen (Active)       Peritoneal Dialysis Catheter Left lower abdomen (Active)   Site Assessment Clean;Dry;Intact 06/23/23 1800   Dressing Occlusive 06/23/23 1800   Dressing Status      Changed 06/23/23 1800   Dressing Change Due 06/30/23 06/23/23 1800   Dressing Intervention No intervention needed 06/23/23 1800   Status Accessed 06/23/23 1800   PD Catheter Transfer Set [employer] Connector 06/23/23 1800     Patient Lines/Drains/Airways Status       Active Peripheral & Central Intravenous Access       Name Placement date Placement time Site Days    Peripheral IV 06/22/23 Right Antecubital 06/22/23  --  Antecubital  1                    SETTINGS:  Mode: Standard Minimum Drain Volume (%): 85%   Smart Dwells: Yes Heater Bag Empty: No   Tidal Full Drains: No Flush: Yes   Program Locked: No I-Drain Alarm (mL): 0 mL   Program Verfied: Yes Lines Unclamped:  Yes.     PEDIATRIC LOW FILL:  INITIAL DRAIN:    Inital Outflow Effluent Appearance: Clear Initial Drain Volume (mL): 1334 mL     THERAPY DETAILS:  Peritoneal Dialysis Fill Volume (ml): 2500 ml Peritoneal Dialysis Total Volume (ml): 14000 ml   Average Dwell Time (Minutes): 111 Minutes Effluent Appearance: Clear     EXCHANGE NET BALANCE:              DIALYSIS ON-CALL NURSE PAGER NUMBER:  Monday thru Friday 0700 - 1730: Call the Dialysis Unit ext. [phone]   After 1730 and all day Sunday: Call the Dialysis RN Pager Number [phone]     PROCEDURE REVIEW, VERIFICATION, HANDOFF:  PD settings verified, procedure reviewed, and instructions given to primary RN.  Dialysis RN Verifying: a Myles Gip Primary PD RN Verifying: Flora Lipps

## 2023-06-24 NOTE — Unmapped (Signed)
Nephrology ([facility]) Progress Note    Assessment & Plan:   Megan Robertson is a 52 y.o. 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) that presented to [facility] with hypotension, electrolyte derangement     Principal Problem:    Hypotension  Active Problems:    Hypertension    Leukocytosis    Anemia    Hypokalemia    ESRD (end stage renal disease) on dialysis (CMS-HCC)    Hyperphosphatemia    Poor fluid intake    Hypovolemia    Dysgeusia    Fatigue    Dysuria    Physical deconditioning    Malnutrition (CMS-HCC)        Active Problems    Hypotension - Poor PO Intake, Altered Taste - Fatigue - Dysuria, Leukocytosis   She was recently admitted for a similar presentation (hypotension, lethargy at that time). Blood pressure responded well to IVF in the ED and she also notes feeling a little better. Denies fevers but has been a little chilly at home. No cough or URI symptoms. Does note dysuria but only producing very small amounts of urine (less than usual). She has been taking midodrine at home (though she does not know her meds well and husband not present to confirm). Suspect her symptoms are due to hypovolemia iso poor PO intake which was also an issue during her last admission. Will cover her empirically for UTI with CTX given her reported dysuria as well as mild leukocytosis, though I have a lower suspicion that sepsis is the cause of her hypotension. Also checking for nutritional deficiencies and consult nutritionist. Patient remains with systolic pressures in the 90's, MAP >60, requiring further fluid resuscitation.    - Continue CTX  - Cell count low concern for infection, gram stain negative. follow up PD fluid culture   - PD prescription change as below  - Follow up urine culture, blood cultures   - CTM Blood pressure - small boluses as needed  - Hold bowel reg given reported frequent BMs - 3-4 for last few days, none in hospital.   - Nutrition consult, appreciate recs;   -Folate, B12 WNL, follow up  zinc levels   -Nepro shakes  -Start Mirtazapine for appetite stimulation  - Daily CBC      ESRD on PD  During last admission, increased time of PD to 12-18 hours per pt due to concern for possible uremia contributing to her poor PO intake and lethargy. Her symptoms had slightly improved by time of discharge despite little improvement in BUN. She continues to report dysgeusia though BUN only 34 on admission. She has an elevation in creatinine and phosphorus and concern for symptomatic uremia. We will trial altering her PD prescription to increase PD volume and time to help with filtration. Counseled patient to consider HD if continues to have symptoms with inadequate PD.   -Increase PD volume and add daytime fluid. Increase to 2.5L (she had 2.2 last admit ; going from 4 to 5 exchanges   -RFP daily - difficult stick  -Electrolytes as below.      Hypokalemia - Hypocalcemia - Muscle Cramping  Hypokalemic to 2.9 at admission improved to 3.5 9/1. Only mild hypocalcemia but potentially could be contributing to her reported facial cramping. Address hyperphos as below. Cramping resolved 9/1 am with repletion.  - Replete K PRN  - Continue home calcitriol      Hyperphosphatemia -  Secondary Hyperparathyroidism  Phosphorus increased to 8.1.  Increase home sevelamer - 2400mg  TID with meals.     Deconditioning - Malnutrition  - PT/OT  - Nutrition consult     Normocytic Anemia  Hgb 10.1, improved from prior but may reflect hemoconcentration iso hypovolemia.   - Iron panel, ferritin  - B12, folate  - Zinc level given altered taste       Chronic Conditions  Hx Clotted AVF: continue home eliquis         Issues Impacting Complexity of Management:  The patient's presentation is complicated by the following clinically significant conditions requiring additional evaluation and treatment: - Hypercoagulable state requiring additional attention to DVT prophylaxis and treatment  - Chronic kidney disease POA requiring further investigation, treatment, or monitoring   - Hypokalemia POA requiring further investigation, treatment, or monitoring  - Dehydration POA requiring further investigation, treatment, or monitoring  - Volume depletion POA requiring further investigation, treatment, or monitoring  - Hypotension POA requiring further investigation, treatment, or monitoring       Daily Checklist:  Diet: Regular Diet  DVT PPx: [name]  Electrolytes: Potassium Repleted  Code Status: Full Code  Dispo: Admit to floor    Team Contact Information:   Primary Team: Nephrology ([facility])  Primary Resident: Luvenia Heller, MD, MD  Resident's Pager: [phone] (Nephrology Intern - Tower)    Interval History:   No acute events overnight. Patient remains with low appetite, altered taste.1x bolus this AM.     ROS: Denies headache, chest pain, shortness of breath, abdominal pain, nausea, vomiting.    Objective:   Temp:  [36.8 ??C (98.2 ??F)-37.2 ??C (99 ??F)] 36.8 ??C (98.2 ??F)  Heart Rate:  [99-107] 101  Resp:  [16-19] 16  BP: (89-104)/(47-73) 96/51  SpO2:  [97 %-100 %] 100 %,   Intake/Output Summary (Last 24 hours) at 06/24/2023 1310  Last data filed at 06/24/2023 0630  Gross per 24 hour   Intake 250 ml   Output 390 ml   Net -140 ml       Gen: NAD, converses   HENT: atraumatic, normocephalic  Heart: RRR  Lungs: CTAB, no crackles or wheezes  Abdomen: soft, NTND  Extremities: No edema      Luvenia Heller, MD  Internal Medicine, PGY-1

## 2023-06-24 NOTE — Unmapped (Signed)
[facility] Nephrology Peritoneal Dialysis Procedure Note     06/24/2023    Patient Megan Robertson was seen and examined on peritoneal dialysis    CHIEF COMPLAINT: End Stage Renal Disease    INTERVAL HISTORY: less dizzy today; feels ok. Still with dysgeusia. Creatinine minimally improved, BUN stable after HD     PERITONEAL DIALYSIS PRESCRIPTION:  DIALYSATE FLUID:  Dianeal Solution: Dextrose 1.5% Calcium 3.5 mEq/L   ADDITIVES:  None    THERAPY DETAILS:  Peritoneal Dialysis Fill Volume (ml): 2500 ml Peritoneal Dialysis Total Volume (ml): 14000 ml   Average Dwell Time (Minutes): 110 Minutes (lost dwell: 2 mins.) Effluent Appearance: Clear     EXCHANGE NET BALANCE:    PD Net Exchange Output (mL): 390 ml     PHYSICAL EXAM:  Vitals:  Temp:  [36.8 ??C (98.2 ??F)-37.2 ??C (99 ??F)] 36.8 ??C (98.2 ??F)  Heart Rate:  [99-107] 101  BP: (89-104)/(47-73) 98/61  MAP (mmHg):  [60-66] 62  Weights:       General: Appearing in no acute distress  Pulmonary: clear to auscultation  Cardiovascular: regular rate and rhythm  Extremities: no significant  edema  Access: PD Catheter, no erythema, no purulence, or no tenderness    LAB DATA:  Lab Results   Component Value Date    NA 142 06/24/2023    K 3.8 06/24/2023    CL 103 06/24/2023    CO2 25.0 06/24/2023    BUN 42 (H) 06/24/2023    CREATININE 13.99 (H) 06/24/2023      Lab Results   Component Value Date    HCT 30.1 (L) 06/24/2023    WBC 12.7 (H) 06/24/2023        ASSESSMENT/PLAN:  ESRD on Peritoneal Dialysis:  Continue CCPD  uf  Will increase therapy time to 14 hrs. May need to consider change to iHD.  Renally dose all medications     Bone Mineral Metabolism:  Lab Results   Component Value Date    CALCIUM 8.3 (L) 06/24/2023    CALCIUM 8.1 (L) 06/24/2023    Lab Results   Component Value Date    ALBUMIN 2.9 (L) 06/24/2023    ALBUMIN 2.3 (L) 06/24/2023      Lab Results   Component Value Date    PHOS 7.9 (H) 06/24/2023    PHOS  06/24/2023      Comment:      Quantity insufficient for testing    Lab Results   Component Value Date    PTH 261.5 (H) 10/24/2020    PTH 173.3 (H) 08/01/2020      Labs appropriate, no changes.  Continue phosphorus binder and dietary counseling.    Anemia:   Lab Results   Component Value Date    HGB 9.8 (L) 06/24/2023    HGB 10.1 (L) 06/23/2023    HGB 8.6 (L) 06/22/2023    Lab Results   Component Value Date    TRANSFERRIN 125.0 (L) 09/25/2021      Lab Results   Component Value Date    FERRITIN 425.7 (H) 06/22/2023    Lab Results   Component Value Date    LABIRON 40 06/23/2023      Anemia labs appropriate, no changes.    This procedure was fully reviewed with the patient and/or their decision-maker. The risks, benefits, and alternatives were discussed prior to the procedure. All questions were answered and written informed consent was obtained.    [name], MD  [place] Division of  Nephrology & Hypertension

## 2023-06-24 NOTE — Unmapped (Signed)
Pt doesn't urinate for the whole shift and there is no urine collection for culture. Pt doesn't report cramping pain today. She is sleeping most of the day. PD started later afternoon. Pt is self care.

## 2023-06-25 LAB — CBC W/ AUTO DIFF
BASOPHILS ABSOLUTE COUNT: 0 10*9/L (ref 0.0–0.1)
BASOPHILS RELATIVE PERCENT: 0.3 %
EOSINOPHILS ABSOLUTE COUNT: 1.1 10*9/L — ABNORMAL HIGH (ref 0.0–0.5)
EOSINOPHILS RELATIVE PERCENT: 6.4 %
HEMATOCRIT: 27.1 % — ABNORMAL LOW (ref 34.0–44.0)
HEMOGLOBIN: 8.9 g/dL — ABNORMAL LOW (ref 11.3–14.9)
LYMPHOCYTES ABSOLUTE COUNT: 2.8 10*9/L (ref 1.1–3.6)
LYMPHOCYTES RELATIVE PERCENT: 16.4 %
MEAN CORPUSCULAR HEMOGLOBIN CONC: 32.7 g/dL (ref 32.0–36.0)
MEAN CORPUSCULAR HEMOGLOBIN: 29.9 pg (ref 25.9–32.4)
MEAN CORPUSCULAR VOLUME: 91.5 fL (ref 77.6–95.7)
MEAN PLATELET VOLUME: 7.5 fL (ref 6.8–10.7)
MONOCYTES ABSOLUTE COUNT: 0.9 10*9/L — ABNORMAL HIGH (ref 0.3–0.8)
MONOCYTES RELATIVE PERCENT: 5.6 %
NEUTROPHILS ABSOLUTE COUNT: 12.1 10*9/L — ABNORMAL HIGH (ref 1.8–7.8)
NEUTROPHILS RELATIVE PERCENT: 71.3 %
PLATELET COUNT: 396 10*9/L (ref 150–450)
RED BLOOD CELL COUNT: 2.96 10*12/L — ABNORMAL LOW (ref 3.95–5.13)
RED CELL DISTRIBUTION WIDTH: 13.7 % (ref 12.2–15.2)
WBC ADJUSTED: 16.9 10*9/L — ABNORMAL HIGH (ref 3.6–11.2)

## 2023-06-25 LAB — RENAL FUNCTION PANEL
ALBUMIN: 2.8 g/dL — ABNORMAL LOW (ref 3.4–5.0)
ANION GAP: 14 mmol/L (ref 5–14)
BLOOD UREA NITROGEN: 41 mg/dL — ABNORMAL HIGH (ref 9–23)
BUN / CREAT RATIO: 3
CALCIUM: 8.3 mg/dL — ABNORMAL LOW (ref 8.7–10.4)
CHLORIDE: 105 mmol/L (ref 98–107)
CO2: 24 mmol/L (ref 20.0–31.0)
CREATININE: 13.13 mg/dL — ABNORMAL HIGH
EGFR CKD-EPI (2021) FEMALE: 3 mL/min/{1.73_m2} — ABNORMAL LOW (ref >=60)
GLUCOSE RANDOM: 83 mg/dL (ref 70–179)
PHOSPHORUS: 7.2 mg/dL — ABNORMAL HIGH (ref 2.4–5.1)
POTASSIUM: 3.5 mmol/L (ref 3.4–4.8)
SODIUM: 143 mmol/L (ref 135–145)

## 2023-06-25 LAB — MAGNESIUM: MAGNESIUM: 1.4 mg/dL — ABNORMAL LOW (ref 1.6–2.6)

## 2023-06-25 LAB — PINK EXTRA TUBE

## 2023-06-25 MED ADMIN — midodrine (PROAMATINE) tablet 5 mg: 5 mg | ORAL | @ 01:00:00 | Stop: 2023-06-24

## 2023-06-25 MED ADMIN — aspirin chewable tablet 81 mg: 81 mg | ORAL | @ 14:00:00

## 2023-06-25 MED ADMIN — nystatin (MYCOSTATIN) powder: TOPICAL | @ 14:00:00

## 2023-06-25 MED ADMIN — dianeal - dextrose 1.5% and calcium 3.5 5,000 mL infusion: INTRAPERITONEAL

## 2023-06-25 MED ADMIN — mirtazapine (REMERON) tablet 15 mg: 15 mg | ORAL | @ 01:00:00

## 2023-06-25 MED ADMIN — calcitriol (ROCALTROL) capsule 0.5 mcg: .5 ug | ORAL | @ 14:00:00

## 2023-06-25 MED ADMIN — fluconazole (DIFLUCAN) tablet 100 mg: 100 mg | ORAL | @ 15:00:00 | Stop: 2023-09-23

## 2023-06-25 MED ADMIN — apixaban (ELIQUIS) tablet 2.5 mg: 2.5 mg | ORAL | @ 14:00:00

## 2023-06-25 MED ADMIN — cefTRIAXone (ROCEPHIN) 1 g in sodium chloride 0.9 % (NS) 100 mL IVPB-MBP: 1 g | INTRAVENOUS | @ 10:00:00 | Stop: 2023-06-28

## 2023-06-25 MED ADMIN — midodrine (PROAMATINE) tablet 5 mg: 5 mg | ORAL | @ 18:00:00

## 2023-06-25 MED ADMIN — magnesium sulfate 2gm/50mL IVPB: 2 g | INTRAVENOUS | @ 18:00:00 | Stop: 2023-06-25

## 2023-06-25 MED ADMIN — hydrocortisone 2.5 % cream: TOPICAL | @ 01:00:00

## 2023-06-25 MED ADMIN — sevelamer (RENVELA) tablet 2,400 mg: 2400 mg | ORAL | @ 18:00:00

## 2023-06-25 MED ADMIN — hydrocortisone 2.5 % cream: TOPICAL | @ 14:00:00

## 2023-06-25 MED ADMIN — sevelamer (RENVELA) tablet 2,400 mg: 2400 mg | ORAL | @ 14:00:00

## 2023-06-25 MED ADMIN — fluticasone furoate (ARNUITY ELLIPTA) 100 mcg/actuation inhaler 1 puff: 1 | RESPIRATORY_TRACT | @ 14:00:00

## 2023-06-25 MED ADMIN — lactated ringers bolus 500 mL: 500 mL | INTRAVENOUS | @ 01:00:00 | Stop: 2023-06-24

## 2023-06-25 MED ADMIN — nystatin (MYCOSTATIN) powder: TOPICAL | @ 01:00:00

## 2023-06-25 MED ADMIN — midodrine (PROAMATINE) tablet 5 mg: 5 mg | ORAL | @ 10:00:00

## 2023-06-25 MED ADMIN — apixaban (ELIQUIS) tablet 2.5 mg: 2.5 mg | ORAL | @ 01:00:00

## 2023-06-25 NOTE — Unmapped (Signed)
Patient denies any pain, 2 doses of LR given as ordered. BP is soft, no c/o of dizziness,MD notified, LR running now at 18ml/hr, first dose of Midodrine given.She ambulates with no issue, call bell within reach.      Problem: Adult Inpatient Plan of Care  Goal: Plan of Care Review  Outcome: Not Progressing  Goal: Patient-Specific Goal (Individualized)  Outcome: Not Progressing  Goal: Absence of Hospital-Acquired Illness or Injury  Outcome: Not Progressing  Goal: Optimal Comfort and Wellbeing  Outcome: Not Progressing  Goal: Readiness for Transition of Care  Outcome: Not Progressing  Goal: Rounds/Family Conference  Outcome: Not Progressing     Problem: Fall Injury Risk  Goal: Absence of Fall and Fall-Related Injury  Outcome: Not Progressing     Problem: Peritoneal Dialysis  Goal: Optimize Fluid and Electrolyte Balance  Outcome: Not Progressing  Goal: Absence of Infection Signs and Symptoms  Outcome: Not Progressing  Goal: Safe, Effective Therapy Delivery  Outcome: Not Progressing

## 2023-06-25 NOTE — Unmapped (Signed)
You have a fax in your box requiring your signature.

## 2023-06-25 NOTE — Unmapped (Signed)
ADULT PERITONEAL DIALYSIS TREATMENT NOTE    PROCEDURE DATE/TIME:    06/25/23 12:09 AM PD THERAPY DAY:  2 PD DEVICE:   (16109)     THERAPY TYPE:  Continuous Cycling Peritoneal Dialysis - Standard     CONSENT:    Written consent was obtained prior to the procedure and is detailed in the medical record. Prior to the start of the procedure, a time out was taken and the identity of the patient was confirmed via name, medical record number and date of birth.    Active Dialysis Orders (168h ago, onward)       Start     Ordered    06/24/23 1941  Peritoneal Dialysis - CCPD Standard  Daily      Question Answer Comment   Therapy Time (hours) Other (please specify) 14   Total Number of Cycles 5    Exchange Volume (L) 2.5L    Day dwell/Last fill (mL) 1500    Dialysate last fill with bag as ordered        06/24/23 1940    06/23/23 0600  Peritoneal Dialysis - MANUAL EXCHANGE  Daily      Question Answer Comment   Exchange Single    Exchange Volume (L) 1L    Dwell time Other (please specify) 2 hours   Fill Time 15 minutes    Drain Time 15 minutes        06/22/23 2054                    VITAL SIGNS:  Vitals:    06/24/23 2058   BP: 101/60   Pulse: 105   Resp: 21   Temp: 36.7 ??C (98.1 ??F)   SpO2: 98%    Vitals:    06/23/23 0256 06/23/23 1752   Weight: 80.2 kg (176 lb 12.9 oz) 81.1 kg (178 lb 10.9 oz)        LAB RESULTS:    Potassium   Date Value Ref Range Status   06/24/2023 3.8 3.4 - 4.8 mmol/L Final       DIALYSATE FLUID:  Dianeal Solution: Dextrose 1.5% Calcium 3.5 mEq/L   Additives:  None    ACCESS:  Peritoneal Dialysis Catheter Continuous cycling Left lower abdomen (Active)       Peritoneal Dialysis Catheter Left lower abdomen (Active)   Site Assessment Clean;Dry;Intact 06/24/23 2126   Dressing Occlusive 06/24/23 2126   Dressing Status      Clean;Dry;Intact/not removed 06/24/23 2126   Dressing Change Due 06/30/23 06/24/23 2000   Dressing Intervention No intervention needed 06/24/23 2000   Status Accessed 06/24/23 2126   PD Catheter Transfer Set [employer] Connector 06/24/23 2000     Patient Lines/Drains/Airways Status       Active Peripheral & Central Intravenous Access       Name Placement date Placement time Site Days    Peripheral IV 06/22/23 Right Antecubital 06/22/23  --  Antecubital  3                    SETTINGS:  Mode: Standard Minimum Drain Volume (%): 85%   Smart Dwells: Yes Heater Bag Empty: No   Tidal Full Drains: No Flush: Yes   Program Locked: No I-Drain Alarm (mL): 0 mL   Program Verfied: Yes     Lines Unclamped:  Yes.      INITIAL DRAIN:    Inital Outflow Effluent Appearance: Clear Initial Drain Volume (mL): 1701 mL  THERAPY DETAILS:  Peritoneal Dialysis Fill Volume (ml): 2500 ml Peritoneal Dialysis Total Volume (ml): 14000 ml   Average Dwell Time (Minutes): 110 Minutes (lost dwell: 2 mins.) Effluent Appearance: Clear     EXCHANGE NET BALANCE:    PD Net Exchange Output (mL): 390 ml         DIALYSIS ON-CALL NURSE PAGER NUMBER:  Monday thru Friday 0700 - 1730: Call the Dialysis Unit ext. [phone]   After 1730 and all day Sunday: Call the Dialysis RN Pager Number [phone]     PROCEDURE REVIEW, VERIFICATION, HANDOFF:  PD settings verified, procedure reviewed, and instructions given to primary RN.  Dialysis RN Verifying: Manley Mason Primary PD RN Verifying: Elvera Lennox Melosantos RN

## 2023-06-25 NOTE — Unmapped (Signed)
Soft BPs, med B informed. Increased midodrine TID. PD overnight. Husband came to visit. Denies pain or cramping. Bed in low locked position. Call light within reach.     Problem: Adult Inpatient Plan of Care  Goal: Plan of Care Review  Outcome: Ongoing - Unchanged  Goal: Patient-Specific Goal (Individualized)  Outcome: Ongoing - Unchanged  Goal: Absence of Hospital-Acquired Illness or Injury  Outcome: Ongoing - Unchanged  Intervention: Identify and Manage Fall Risk  Recent Flowsheet Documentation  Taken 06/24/2023 2126 by Dajana Gehrig, RN  Safety Interventions:   fall reduction program maintained   lighting adjusted for tasks/safety   low bed   no IV/BP/blood draw left arm   nonskid shoes/slippers when out of bed  Goal: Optimal Comfort and Wellbeing  Outcome: Ongoing - Unchanged  Goal: Readiness for Transition of Care  Outcome: Ongoing - Unchanged  Goal: Rounds/Family Conference  Outcome: Ongoing - Unchanged     Problem: Fall Injury Risk  Goal: Absence of Fall and Fall-Related Injury  Outcome: Ongoing - Unchanged  Intervention: Promote Injury-Free Environment  Recent Flowsheet Documentation  Taken 06/24/2023 2126 by [employer], Talicia Sui, RN  Safety Interventions:   fall reduction program maintained   lighting adjusted for tasks/safety   low bed   no IV/BP/blood draw left arm   nonskid shoes/slippers when out of bed     Problem: Peritoneal Dialysis  Goal: Optimize Fluid and Electrolyte Balance  Outcome: Ongoing - Unchanged  Goal: Absence of Infection Signs and Symptoms  Outcome: Ongoing - Unchanged  Goal: Safe, Effective Therapy Delivery  Outcome: Ongoing - Unchanged     Problem: Wound  Goal: Optimal Coping  Outcome: Ongoing - Unchanged  Goal: Optimal Functional Ability  Outcome: Ongoing - Unchanged  Goal: Absence of Infection Signs and Symptoms  Outcome: Ongoing - Unchanged  Goal: Improved Oral Intake  Outcome: Ongoing - Unchanged  Goal: Optimal Pain Control and Function  Outcome: Ongoing - Unchanged  Goal: Skin Health and Integrity  Outcome: Ongoing - Unchanged  Intervention: Optimize Skin Protection  Recent Flowsheet Documentation  Taken 06/24/2023 2126 by Puja Caffey, RN  Head of Bed (HOB) Positioning: HOB elevated  Goal: Optimal Wound Healing  Outcome: Ongoing - Unchanged     Problem: Electrolyte Imbalance  Goal: Electrolyte Balance  Outcome: Ongoing - Unchanged

## 2023-06-25 NOTE — Unmapped (Signed)
The Venous Access Team (VAT) received a request for PIV access.   VAT assessed patient at bedside.  Appropriate PPE were utilized. Unable to place PIV at this time as patient has thick coating of medication cream to BUE. Primary nurse notified and states she will re-consult VAT when patient is available.        PIV Workup / Procedure Time:  15 minutes    Primary RN was notified.     Thank you,     Lance Bosch, RN Venous Access Team

## 2023-06-25 NOTE — Unmapped (Signed)
VENOUS ACCESS ULTRASOUND PROCEDURE NOTE    Indications:   Poor venous access.    The Venous Access Team has assessed this patient for the placement of a PIV. Ultrasound guidance was necessary to obtain access.     Procedure Details:  Identity of the patient was confirmed via name, medical record number and date of birth. The availability of the correct equipment was verified.    The vein was identified for ultrasound catheter insertion.  Field was prepared with necessary supplies and equipment.  Probe cover and sterile gel utilized.  Insertion site was prepped with chlorhexidine solution and allowed to dry.  The catheter extension was primed with normal saline. A(n) 22 gauge 1.75 catheter was placed in the R Forearm with 1attempt(s). See LDA for additional details.    Catheter aspirated, 3 mL blood return present. The catheter was then flushed with 10 mL of normal saline. Insertion site cleansed, and dressing applied per manufacturer guidelines. The catheter was inserted without difficulty by Gisel Vipond, RN.     CARE RN was notified.     Thank you,     Yajahira Tison Jean Rosenthal, RN Venous Access Team   [phone]     Workup / Procedure Time:  15 minutes    See images below:

## 2023-06-25 NOTE — Unmapped (Signed)
Nephrology ([facility]) Progress Note    Assessment & Plan:   Megan Robertson is a 52 y.o. 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) that presented to [facility] with hypotension, electrolyte derangement     Principal Problem:    Hypotension  Active Problems:    Hypertension    Leukocytosis    Anemia    Hypokalemia    ESRD (end stage renal disease) on dialysis (CMS-HCC)    Hyperphosphatemia    Poor fluid intake    Hypovolemia    Dysgeusia    Fatigue    Dysuria    Physical deconditioning    Malnutrition (CMS-HCC)        Active Problems    Hypotension - Poor PO Intake, Altered Taste - Fatigue - Dysuria, Leukocytosis   She was recently admitted for a similar presentation (hypotension, lethargy at that time). Blood pressure responded well to IVF in the ED and she also notes feeling a little better. Denies fevers but has been a little chilly at home. No cough or URI symptoms. Does note dysuria but only producing very small amounts of urine (less than usual). She has been taking midodrine at home (though she does not know her meds well and husband not present to confirm). Suspect her symptoms are due to hypovolemia iso poor PO intake which was also an issue during her last admission. Will cover her empirically for UTI with CTX given her reported dysuria which has since resolved and increased leukocytosis. Patient remains with systolic pressures MAP >60, requiring further fluid resuscitation as needed.   - Continue CTX  - Cell count low concern for infection, gram stain negative. PD fluid culture with NGTD   - PD prescription change as below  - Follow up urine culture, blood cultures   - CTM Blood pressure - small boluses as needed  - Hold bowel reg given reported frequent BMs - 3-4 for last few days, none in hospital.   - Nutrition consult, appreciate recs;   - Folate, B12 WNL, follow up  zinc levels   - Nepro shakes  - Mirtazapine for appetite stimulation  - Daily CBC   - Midodrine 5 mg TID     ESRD on PD  During last admission, increased time of PD to 12-18 hours per pt due to concern for possible uremia contributing to her poor PO intake and lethargy. Her symptoms had slightly improved by time of discharge despite little improvement in BUN. She continues to report dysgeusia though BUN only 34 on admission. She has an elevation in creatinine and phosphorus and concern for symptomatic uremia. We will trial altering her PD prescription to increase PD volume and time to help with filtration. Counseled patient to consider HD if continues to have symptoms with inadequate PD.    -Increase PD volume and add daytime fluid. Increase to 2.5L (she had 2.2 last admit ; going from 4 to 5 exchanges)  - RFP daily - difficult stick  - Plan to discuss PD vs. HD with outpatient nephrology team  - Electrolytes as below  - Fungal prophylaxis with fluconazole 100 mg daily      Hypokalemia, Hypocalcemia, Muscle Cramping - Resolving  Hypokalemic to 2.9 at admission, now improved. Only mild hypocalcemia but potentially could be contributing to her reported facial cramping. Address hyperphos as below. Cramping resolved 9/1 am with repletion.  - Replete K PRN  - Continue home  calcitriol     Hyperphosphatemia - Secondary Hyperparathyroidism  Phosphorus increased to 8.1.  - Sevelamer - 2400mg  TID with meals.     Deconditioning - Malnutrition  - PT/OT  - Nutrition consult     Normocytic Anemia  Hgb 10.1, improved from prior but may reflect hemoconcentration iso hypovolemia.   - Iron panel, ferritin  - B12, folate  - Zinc level given altered taste       Chronic Conditions  Hx Clotted AVF: continue home eliquis     Issues Impacting Complexity of Management:  The patient's presentation is complicated by the following clinically significant conditions requiring additional evaluation and treatment: - Hypercoagulable state requiring additional attention to DVT prophylaxis and treatment  - Chronic kidney disease POA requiring further investigation, treatment, or monitoring   - Hypokalemia POA requiring further investigation, treatment, or monitoring  - Dehydration POA requiring further investigation, treatment, or monitoring  - Volume depletion POA requiring further investigation, treatment, or monitoring  - Hypotension POA requiring further investigation, treatment, or monitoring       Daily Checklist:  Diet: Regular Diet  DVT PPx: [name]  Electrolytes: Potassium Repleted  Code Status: Full Code  Dispo: Admit to floor    Team Contact Information:   Primary Team: Nephrology ([facility])  Primary Resident: Anastasio Champion, MD, MD  Resident's Pager: [phone] (Nephrology Intern - Tower)    Interval History:   No acute events overnight. Patient remains with low appetite, altered taste.Cramping has resolved. She has no further dysuria and made some urine this AM. She is unsure of her home midodrine dose. Received extra midodrine and made midodrine TID. 1x bolus this AM.     ROS: Denies headache, chest pain, shortness of breath, abdominal pain, nausea, vomiting.    Objective:   Temp:  [36.3 ??C (97.3 ??F)-37.2 ??C (99 ??F)] 36.5 ??C (97.7 ??F)  Heart Rate:  [88-111] 97  Resp:  [16-21] 16  BP: (82-101)/(38-62) 95/62  SpO2:  [93 %-100 %] 100 %,   Intake/Output Summary (Last 24 hours) at 06/25/2023 1046  Last data filed at 06/25/2023 1010  Gross per 24 hour   Intake 240 ml   Output 601 ml   Net -361 ml       Gen: NAD, converses   HENT: atraumatic, normocephalic  Heart: RRR  Lungs: CTAB, no crackles or wheezes  Abdomen: soft, NTND  Extremities: No edema      Suzie Portela, MD  Internal Medicine, PGY-1

## 2023-06-26 LAB — CBC W/ AUTO DIFF
BASOPHILS ABSOLUTE COUNT: 0.1 10*9/L (ref 0.0–0.1)
BASOPHILS RELATIVE PERCENT: 0.4 %
EOSINOPHILS ABSOLUTE COUNT: 1 10*9/L — ABNORMAL HIGH (ref 0.0–0.5)
EOSINOPHILS RELATIVE PERCENT: 5.9 %
HEMATOCRIT: 26.3 % — ABNORMAL LOW (ref 34.0–44.0)
HEMOGLOBIN: 8.6 g/dL — ABNORMAL LOW (ref 11.3–14.9)
LYMPHOCYTES ABSOLUTE COUNT: 2.7 10*9/L (ref 1.1–3.6)
LYMPHOCYTES RELATIVE PERCENT: 16.4 %
MEAN CORPUSCULAR HEMOGLOBIN CONC: 32.5 g/dL (ref 32.0–36.0)
MEAN CORPUSCULAR HEMOGLOBIN: 29.7 pg (ref 25.9–32.4)
MEAN CORPUSCULAR VOLUME: 91.5 fL (ref 77.6–95.7)
MEAN PLATELET VOLUME: 7.4 fL (ref 6.8–10.7)
MONOCYTES ABSOLUTE COUNT: 0.9 10*9/L — ABNORMAL HIGH (ref 0.3–0.8)
MONOCYTES RELATIVE PERCENT: 5.4 %
NEUTROPHILS ABSOLUTE COUNT: 12 10*9/L — ABNORMAL HIGH (ref 1.8–7.8)
NEUTROPHILS RELATIVE PERCENT: 71.9 %
PLATELET COUNT: 387 10*9/L (ref 150–450)
RED BLOOD CELL COUNT: 2.88 10*12/L — ABNORMAL LOW (ref 3.95–5.13)
RED CELL DISTRIBUTION WIDTH: 14.1 % (ref 12.2–15.2)
WBC ADJUSTED: 16.8 10*9/L — ABNORMAL HIGH (ref 3.6–11.2)

## 2023-06-26 LAB — RENAL FUNCTION PANEL
ALBUMIN: 2.7 g/dL — ABNORMAL LOW (ref 3.4–5.0)
ANION GAP: 13 mmol/L (ref 5–14)
BLOOD UREA NITROGEN: 40 mg/dL — ABNORMAL HIGH (ref 9–23)
BUN / CREAT RATIO: 3
CALCIUM: 8.4 mg/dL — ABNORMAL LOW (ref 8.7–10.4)
CHLORIDE: 103 mmol/L (ref 98–107)
CO2: 26 mmol/L (ref 20.0–31.0)
CREATININE: 13.29 mg/dL — ABNORMAL HIGH
EGFR CKD-EPI (2021) FEMALE: 3 mL/min/{1.73_m2} — ABNORMAL LOW (ref >=60)
GLUCOSE RANDOM: 111 mg/dL (ref 70–179)
PHOSPHORUS: 6.8 mg/dL — ABNORMAL HIGH (ref 2.4–5.1)
POTASSIUM: 3.5 mmol/L (ref 3.4–4.8)
SODIUM: 142 mmol/L (ref 135–145)

## 2023-06-26 LAB — ZINC: ZINC: 55 ug/dL — ABNORMAL LOW

## 2023-06-26 LAB — MAGNESIUM: MAGNESIUM: 2.2 mg/dL (ref 1.6–2.6)

## 2023-06-26 MED ADMIN — dianeal - dextrose 1.5% and calcium 3.5 5,000 mL infusion: INTRAPERITONEAL

## 2023-06-26 MED ADMIN — midodrine (PROAMATINE) tablet 5 mg: 5 mg | ORAL | @ 10:00:00

## 2023-06-26 MED ADMIN — fluticasone furoate (ARNUITY ELLIPTA) 100 mcg/actuation inhaler 1 puff: 1 | RESPIRATORY_TRACT | @ 14:00:00

## 2023-06-26 MED ADMIN — apixaban (ELIQUIS) tablet 2.5 mg: 2.5 mg | ORAL | @ 01:00:00

## 2023-06-26 MED ADMIN — apixaban (ELIQUIS) tablet 2.5 mg: 2.5 mg | ORAL | @ 14:00:00

## 2023-06-26 MED ADMIN — hydrocortisone 2.5 % cream: TOPICAL | @ 14:00:00

## 2023-06-26 MED ADMIN — mirtazapine (REMERON) tablet 15 mg: 15 mg | ORAL | @ 01:00:00

## 2023-06-26 MED ADMIN — sevelamer (RENVELA) tablet 2,400 mg: 2400 mg | ORAL | @ 14:00:00

## 2023-06-26 MED ADMIN — cefTRIAXone (ROCEPHIN) 1 g in sodium chloride 0.9 % (NS) 100 mL IVPB-MBP: 1 g | INTRAVENOUS | @ 10:00:00 | Stop: 2023-06-27

## 2023-06-26 MED ADMIN — nystatin (MYCOSTATIN) powder: TOPICAL | @ 01:00:00

## 2023-06-26 MED ADMIN — midodrine (PROAMATINE) tablet 5 mg: 5 mg | ORAL | @ 02:00:00

## 2023-06-26 MED ADMIN — aspirin chewable tablet 81 mg: 81 mg | ORAL | @ 14:00:00

## 2023-06-26 MED ADMIN — sevelamer (RENVELA) tablet 2,400 mg: 2400 mg | ORAL | @ 01:00:00

## 2023-06-26 MED ADMIN — midodrine (PROAMATINE) tablet 5 mg: 5 mg | ORAL | @ 18:00:00

## 2023-06-26 MED ADMIN — sevelamer (RENVELA) tablet 2,400 mg: 2400 mg | ORAL | @ 22:00:00

## 2023-06-26 MED ADMIN — sevelamer (RENVELA) tablet 2,400 mg: 2400 mg | ORAL | @ 18:00:00

## 2023-06-26 MED ADMIN — nystatin (MYCOSTATIN) powder: TOPICAL | @ 14:00:00

## 2023-06-26 MED ADMIN — fluconazole (DIFLUCAN) tablet 100 mg: 100 mg | ORAL | @ 14:00:00 | Stop: 2023-09-23

## 2023-06-26 MED ADMIN — hydrocortisone 2.5 % cream: TOPICAL | @ 01:00:00

## 2023-06-26 MED ADMIN — calcitriol (ROCALTROL) capsule 0.5 mcg: .5 ug | ORAL | @ 14:00:00

## 2023-06-26 MED ADMIN — multivitamin, therapeutic with minerals oral liquid: 15 mL | ORAL | @ 22:00:00

## 2023-06-26 NOTE — Unmapped (Signed)
PD overnight. BP soft. PD overnight. Telemetry monitoring. IV abx given. Denies pain or cramping.  Bed in low locked position. Call light within reach.     Problem: Adult Inpatient Plan of Care  Goal: Plan of Care Review  Outcome: Ongoing - Unchanged  Goal: Patient-Specific Goal (Individualized)  Outcome: Ongoing - Unchanged  Goal: Absence of Hospital-Acquired Illness or Injury  Outcome: Ongoing - Unchanged  Intervention: Identify and Manage Fall Risk  Recent Flowsheet Documentation  Taken 06/25/2023 2035 by Tykeem Lanzer, RN  Safety Interventions:   fall reduction program maintained   low bed   lighting adjusted for tasks/safety   bleeding precautions   no IV/BP/blood draw left arm   environmental modification  Goal: Optimal Comfort and Wellbeing  Outcome: Ongoing - Unchanged  Goal: Readiness for Transition of Care  Outcome: Ongoing - Unchanged  Goal: Rounds/Family Conference  Outcome: Ongoing - Unchanged     Problem: Fall Injury Risk  Goal: Absence of Fall and Fall-Related Injury  Outcome: Ongoing - Unchanged  Intervention: Promote Injury-Free Environment  Recent Flowsheet Documentation  Taken 06/25/2023 2035 by [employer], Cypher Paule, RN  Safety Interventions:   fall reduction program maintained   low bed   lighting adjusted for tasks/safety   bleeding precautions   no IV/BP/blood draw left arm   environmental modification     Problem: Peritoneal Dialysis  Goal: Optimize Fluid and Electrolyte Balance  Outcome: Ongoing - Unchanged  Goal: Absence of Infection Signs and Symptoms  Outcome: Ongoing - Unchanged  Goal: Safe, Effective Therapy Delivery  Outcome: Ongoing - Unchanged     Problem: Wound  Goal: Optimal Coping  Outcome: Ongoing - Unchanged  Goal: Optimal Functional Ability  Outcome: Ongoing - Unchanged  Goal: Absence of Infection Signs and Symptoms  Outcome: Ongoing - Unchanged  Goal: Improved Oral Intake  Outcome: Ongoing - Unchanged  Goal: Optimal Pain Control and Function  Outcome: Ongoing - Unchanged  Goal: Skin Health and Integrity  Outcome: Ongoing - Unchanged  Intervention: Optimize Skin Protection  Recent Flowsheet Documentation  Taken 06/25/2023 2035 by Araiyah Cumpton, RN  Head of Bed (HOB) Positioning: HOB elevated  Goal: Optimal Wound Healing  Outcome: Ongoing - Unchanged     Problem: Electrolyte Imbalance  Goal: Electrolyte Balance  Outcome: Ongoing - Unchanged     Problem: Pain Acute  Goal: Optimal Pain Control and Function  Outcome: Ongoing - Unchanged

## 2023-06-26 NOTE — Unmapped (Signed)
VASCULAR INTERVENTIONAL RADIOLOGY INPATIENT CVC CONSULTATION     Requesting Attending Physician: Lyla Glassing, MD  Service Requesting Consult: Nephrology (MDB)    Date of Service: 06/26/2023  Consulting Interventional Radiologist: Dr. Claretta Fraise     HPI:     Reason for consult: Tunneled hemodialysis catheter placement      History of Present Illness:   Megan Robertson is a 52 y.o. female with past medical history of ESRD secondary to FSGS on PD, chronic pain, asthma, and history of clotted left upper extremity AVF that presented for hypotension and electrolyte derangement. Increased times of PD up to 18 hours, with persistent uremia despite PD. Patient states prior history of tunneled right internal jugular central venous cathter.     Patient seen in consultation at the request of primary care team for consideration for tunneled hemodialysis catheter placement.     Review of Systems:  Pertinent items are noted in HPI.    Medical History:     Past Medical History:  Past Medical History:   Diagnosis Date    Abnormal mammogram     Abnormal Pap smear of cervix     Allergic     Anemia     Anxiety     Arthritis     Asthma     Back pain     Cancer (CMS-HCC) Ovarian    Depression     Diabetes mellitus (CMS-HCC) Borderline    Eczema     ESRD (end stage renal disease) on dialysis (CMS-HCC)     FSGS (focal segmental glomerulosclerosis) 1997    renal biopsy at [facility]    GERD (gastroesophageal reflux disease)     Gout     H/O adenoidectomy     had adenoids removed    Hypercholesteremia     Hypertension     Keloid     Ovarian cancer (CMS-HCC)     Pancreatitis     Psoriasis     QT prolongation 06/04/2023    Seizure (CMS-HCC)     unknown etiology; none for several years    Sepsis (CMS-HCC) 09/16/2022    Stroke (CMS-HCC) 2000    Tachycardia 07/31/2020       Surgical History:  Past Surgical History:   Procedure Laterality Date    APPENDECTOMY      BREAST EXCISIONAL BIOPSY Right 11/28/2016    CHEMOTHERAPY      for ovarian CA in 2000    CHG US GUIDE, VASCULAR ACCESS N/A 01/29/2020    Procedure: ULTRASOUND GUIDANCE FOR VASC ACCESS REQUIRING [place] EVAL OF POTENTIAL ACCESS SITES;  Surgeon: Leona Carry, MD;  Location: MAIN OR [facility];  Service: Transplant    CHG US GUIDE, VASCULAR ACCESS N/A 06/08/2020    Procedure: ULTRASOUND GUIDANCE FOR VASC ACCESS REQUIRING [place] EVAL OF POTENTIAL ACCESS SITES;  Surgeon: Leona Carry, MD;  Location: MAIN OR [facility];  Service: Transplant    CHOLECYSTECTOMY      COMBINED HYSTEROSCOPY DIAGNOSTIC / D&C  07/07/2015    Planned for endometrial ablation, but patient had uterine perforation after D&C and unable to perform    HYSTERECTOMY      LEFT OOPHORECTOMY Left 04/12/2000    Removed with L fallopian tube for ectopic pregnancy    OOPHORECTOMY      PR COLONOSCOPY W/BIOPSY SINGLE/MULTIPLE  09/12/2021    Procedure: COLONOSCOPY, FLEXIBLE, PROXIMAL TO SPLENIC FLEXURE; WITH BIOPSY, SINGLE OR MULTIPLE;  Surgeon: Maris Berger, MD;  Location: GI PROC[facility]URES [facility];  Service:  Gastroenterology    PR COLSC FLX W/RMVL OF TUMOR POLYP LESION SNARE TQ N/A 09/12/2021    Procedure: COLONOSCOPY FLEX; W/REMOV TUMOR/LES BY SNARE;  Surgeon: Maris Berger, MD;  Location: [facility];  Service: Gastroenterology    PR CREAT AV FISTULA,AUTOGENOUS GRAFT Left 01/29/2020    Procedure: Create Av Fistula (Separt Proc); Autog Gft;  Surgeon: Leona Carry, MD;  Location: MAIN OR [facility];  Service: Transplant    PR CREAT AV FISTULA,NON-AUTOGENOUS GRAFT Left 06/08/2020    Procedure: CREATE AV FISTULA (SEPARATE PROC); NONAUTOGENOUS GRAFT (EG, BIOLOGICAL COLLAGEN, THERMOPLASTIC GRAFT);  Surgeon: Leona Carry, MD;  Location: MAIN OR [facility];  Service: Transplant    PR EXPLORATION N/FLWD SURG UPPER EXTREMITY ARTERY Left 08/02/2020    Procedure: Exploration Not Followed By Surgical Repair, Artery; Upper Extremity (Eg, Axillary, Brachial, Radial, Ulnar);  Surgeon: Earney Mallet, MD; Location: MAIN OR [facility];  Service: Vascular    PR INCIS/DRAIN ARM,DEEP ABSC/HEMATOMA Left 08/14/2020    Procedure: INCISION AND DRAINAGE, UPPER ARM OR ELBOW AREA; DEEP ABSCESS OR HEMATOMA;  Surgeon: Earney Mallet, MD;  Location: MAIN OR [place];  Service: Vascular    PR INSERTION TUNNEL INTRAPERITONEAL CATH DIAL OPEN Midline 02/01/2021    Procedure: INSERTION OF INTRAPERITONEAL CANNULA OR CATHETER FOR DRAINAGE OR DIALYSIS; PERMANENT;  Surgeon: Loney Hering, MD;  Location: MAIN OR [facility];  Service: Transplant    PR REMV ART CLOT AXILL-BRACH,ARM INCIS Left 08/02/2020    Procedure: Embolect/Thrombec; Axilry/Brachial Art-Arm Incs;  Surgeon: Earney Mallet, MD;  Location: MAIN OR [facility];  Service: Vascular    PR UPPER GI ENDOSCOPY,BIOPSY N/A 08/29/2017    Procedure: UGI ENDOSCOPY; WITH BIOPSY, SINGLE OR MULTIPLE;  Surgeon: Neysa Hotter, MD;  Location: GI PROCEDURES [facility];  Service: Gastroenterology    PR VEIN BYPASS GRAFT,SUBCL-BRACHIAL Left 08/05/2020    Procedure: Bypass Graft, With Vein; Subclavian-Brachial;  Surgeon: Earney Mallet, MD;  Location: MAIN OR [facility];  Service: Vascular    SALPINGECTOMY Left 04/12/2000    Ectopic pregnancy    SALPINGECTOMY Right 11/22/2015    at time of total vaginal hysterectomy    TONSILECTOMY, ADENOIDECTOMY, BILATERAL MYRINGOTOMY AND TUBES      TOTAL VAGINAL HYSTERECTOMY  11/22/2015    With R salpingectomy       Family History:  Family History   Adopted: Yes   Problem Relation Age of Onset    Heart attack Mother 51    Alcohol abuse Mother     Mental illness Mother     No Known Problems Father     No Known Problems Sister     No Known Problems Daughter     No Known Problems Maternal Grandmother     No Known Problems Maternal Grandfather     No Known Problems Paternal Grandmother     No Known Problems Paternal Grandfather     No Known Problems Brother     No Known Problems Son     No Known Problems Maternal Aunt     No Known Problems Maternal Uncle No Known Problems Paternal Aunt     No Known Problems Paternal Uncle     No Known Problems Other     Anesthesia problems Neg Hx     Broken bones Neg Hx     Cancer Neg Hx     Clotting disorder Neg Hx     Collagen disease Neg Hx     Diabetes Neg Hx     Dislocations Neg Hx  Fibromyalgia Neg Hx     Gout Neg Hx     Hemophilia Neg Hx     Osteoporosis Neg Hx     Rheumatologic disease Neg Hx     Scoliosis Neg Hx     Severe sprains Neg Hx     Sickle cell anemia Neg Hx     Spinal Compression Fracture Neg Hx     Melanoma Neg Hx     Basal cell carcinoma Neg Hx     Squamous cell carcinoma Neg Hx     Breast cancer Neg Hx        Medications:   Current Facility-Administered Medications   Medication Dose Route Frequency Provider Last Rate Last Admin    acetaminophen (TYLENOL) tablet 1,000 mg  1,000 mg Oral Q8H PRN Goldbeck, Lauren D, MD        albuterol (PROVENTIL HFA;VENTOLIN HFA) 90 mcg/actuation inhaler 2 puff  2 puff Inhalation Q6H PRN Achille Rich, MD        apixaban Everlene Balls) tablet 2.5 mg  2.5 mg Oral BID Achille Rich, MD   2.5 mg at 06/26/23 1001    aspirin chewable tablet 81 mg  81 mg Oral Daily Achille Rich, MD   81 mg at 06/26/23 1001    calcitriol (ROCALTROL) capsule 0.5 mcg  0.5 mcg Oral Daily Hetty Blend D, MD   0.5 mcg at 06/26/23 1001    cefTRIAXone (ROCEPHIN) 1 g in sodium chloride 0.9 % (NS) 100 mL IVPB-MBP  1 g Intravenous Q24H Anastasio Champion, MD   Stopped at 06/26/23 6644    dianeal - dextrose 1.5% and calcium 3.5 5,000 mL infusion   Intraperitoneal Continuous Derebail, Staci Righter, MD   New Bag at 06/25/23 1944    dianeal - dextrose 1.5% and calcium 3.5 5,000 mL infusion   Intraperitoneal Continuous Derebail, Staci Righter, MD   New Bag at 06/25/23 1943    fluconazole (DIFLUCAN) tablet 100 mg  100 mg Oral Daily Lita Mains, MD   100 mg at 06/26/23 1001    fluticasone furoate (ARNUITY ELLIPTA) 100 mcg/actuation inhaler 1 puff  1 puff Inhalation Daily (RT) Achille Rich, MD 1 puff at 06/26/23 1002    guaiFENesin (ROBITUSSIN) oral syrup  200 mg Oral Q4H PRN Achille Rich, MD        hydrocortisone 2.5 % cream   Topical BID Luvenia Heller, MD   Given at 06/26/23 1002    melatonin tablet 3 mg  3 mg Oral Nightly PRN Achille Rich, MD        midodrine (PROAMATINE) tablet 5 mg  5 mg Oral Q8H [facility] Goldbeck, Lauren D, MD   5 mg at 06/26/23 1420    mirtazapine (REMERON) tablet 15 mg  15 mg Oral Nightly Luvenia Heller, MD   15 mg at 06/25/23 2035    multivitamin, therapeutic with minerals oral liquid  15 mL Oral Daily Burgess Estelle, MD        nystatin (MYCOSTATIN) powder   Topical BID Achille Rich, MD   Given at 06/26/23 1002    polyethylene glycol (MIRALAX) packet 17 g  17 g Oral Daily PRN Achille Rich, MD        sevelamer (RENVELA) tablet 2,400 mg  2,400 mg Oral 3xd Meals Achille Rich, MD   2,400 mg at 06/26/23 1419       Allergies:  Cephalexin, Mango, Peach (prunus persica), Wellbutrin [bupropion hcl], Egg, Fish containing products,  Lactase-rennet, Lexiscan [regadenoson], and Mushroom    Social History:  Social History     Tobacco Use    Smoking status: Former     Current packs/day: 0.00     Average packs/day: 2.0 packs/day for 1 year (2.0 ttl pk-yrs)     Types: Cigarettes     Start date: 06/30/2009     Quit date: 06/30/2010     Years since quitting: 12.9    Smokeless tobacco: Never   Vaping Use    Vaping status: Never Used   Substance Use Topics    Alcohol use: Not Currently    Drug use: Not Currently       Objective:      Vital Signs:  Temp:  [36.6 ??C (97.9 ??F)-37.4 ??C (99.3 ??F)] 36.6 ??C (97.9 ??F)  Heart Rate:  [88-105] 94  Resp:  [16-21] 16  BP: (86-106)/(38-69) 106/49  MAP (mmHg):  [51-75] 64  SpO2:  [97 %-100 %] 99 %    Physical Exam:      Vitals:    06/26/23 1100   BP: 106/49   Pulse: 94   Resp: 16   Temp: 36.6 ??C (97.9 ??F)   SpO2:      ASA Grade: ASA 2 - Patient with mild systemic disease with no functional limitations  General: No apparent distress.  Lungs: Breathing comfortably on room air.  Heart:  Regular rate and rhythm.   Neuro: No obvious focal deficits.    Airway assessment: Class 3 - Can visualize soft palate    Diagnostic Studies:  None Relavent    Labs:    Recent Labs     06/24/23  1327 06/25/23  0711 06/26/23  0929   WBC 12.7* 16.9* 16.8*   HGB 9.8* 8.9* 8.6*   HCT 30.1* 27.1* 26.3*   PLT 342 396 387     Recent Labs     06/24/23  1327 06/25/23  0711 06/26/23  0929   NA 142 143 142   K 3.8 3.5 3.5   CL 103 105 103   BUN 42* 41* 40*   CREATININE 13.99* 13.13* 13.29*   GLU 86 83 111     Recent Labs     06/24/23  1327 06/25/23  0711 06/26/23  0929   ALBUMIN 2.9* 2.8* 2.7*     No results for input(s): INR, APTT, FIBRINOGEN in the last 72 hours.    Blood Cultures Pending:  Yes. NGTD x 72 hr    Assessment and Recommendations:     Megan Robertson is a 52 y.o. female PMHx of ESRD 2/2 FSGS on PD, and history of clotted left upper extremity AVF (on eliquis) who originally presented for hypotension and electolyte derangements. PD times have increased up to 18 hours with persistent uremia. VIR consulted for tunneled hemodialysis catheter placement. If patient is in need of urgent hemodialysis please attempt temp catheter placement.       Recommendations:  - Proceed with placement of Dialysis - Tunneled  - Anticipated procedure date: 06/28/2023  - Anticoagulant medication hold required: No.  - Please make NPO night prior to procedure  - Please ensure recent CBC, Creatinine, and INR are available  - Antibiotic required: Yes.   - If yes, patient will receive Ancef 2g IV (weight 80-120kg) in VIR prior to procedure      Informed Consent:  This procedure and sedation has been fully reviewed with the patient/patient???s authorized representative. The risks, benefits and alternatives including but not limited to  infection, bleeding, damage to adjacent structures have been explained, and the patient/patient???s authorized representative has consented to the procedure.  --The patient will accept blood products in an emergent situation.  --The patient does not have a Do Not Resuscitate order in effect.    Consent obtained by Judye Bos, MD, witnessed, and scanned into the medical record.     The patient was discussed with Dr. Claretta Fraise.     Thank you for involving [place] in the care of this patient. Please page the VIR consult pager [phone]) with further questions, concerns, or if new issues arise.

## 2023-06-26 NOTE — Unmapped (Signed)
Patient denies any pain, vs are stable,on scheduled Midodrine. She has new ultrasound guided PIV, Mag was replaced.  Spouse at the bedside, call bell within reach.    Problem: Adult Inpatient Plan of Care  Goal: Plan of Care Review  Outcome: Ongoing - Unchanged  Goal: Patient-Specific Goal (Individualized)  Outcome: Ongoing - Unchanged  Goal: Absence of Hospital-Acquired Illness or Injury  Outcome: Ongoing - Unchanged  Intervention: Identify and Manage Fall Risk  Recent Flowsheet Documentation  Taken 06/25/2023 0745 by Valentino Nose, RN  Safety Interventions:   fall reduction program maintained   low bed   no IV/BP/blood draw left arm   nonskid shoes/slippers when out of bed  Goal: Optimal Comfort and Wellbeing  Outcome: Ongoing - Unchanged  Goal: Readiness for Transition of Care  Outcome: Ongoing - Unchanged  Goal: Rounds/Family Conference  Outcome: Ongoing - Unchanged     Problem: Fall Injury Risk  Goal: Absence of Fall and Fall-Related Injury  Outcome: Ongoing - Unchanged  Intervention: Promote Injury-Free Environment  Recent Flowsheet Documentation  Taken 06/25/2023 0745 by Valentino Nose, RN  Safety Interventions:   fall reduction program maintained   low bed   no IV/BP/blood draw left arm   nonskid shoes/slippers when out of bed

## 2023-06-26 NOTE — Unmapped (Signed)
Care Management  Initial Transition Planning Assessment              General  Care Manager assessed the patient by : In person interview with patient, Medical record review  Orientation Level: Oriented X4  Functional level prior to admission: Partially Assisted  Who provides care at home?: Family member  Level of assistance required: Bathing, Dressing, Grooming, Transferring  Reason for referral: Discharge Planning    Contact/Decision Maker  Extended Emergency Contact Information  Primary Emergency Contact: Jourdan,JULIUS  Address: [address]           [place], [place] [address] [place] [employer] Phone: [phone]  Relation: Spouse  Preferred language: ENGLISH  Interpreter needed? No    Legal Next of Kin / Guardian / POA / Advance Directives     HCDM (patient stated preference): STEPHENE, SUMMA - Spouse - [phone]    Advance Directive (Medical Treatment)  Does patient have an advance directive covering medical treatment?: Patient does not have advance directive covering medical treatment.  Reason patient does not have an advance directive covering medical treatment:: Patient does not wish to complete one at this time.    Health Care Decision Maker [HCDM] (Medical & Mental Health Treatment)  Healthcare Decision Maker: HCDM documented in the HCDM/Contact Info section.  Information offered on HCDM, Medical & Mental Health advance directives:: Patient given information.    Advance Directive (Mental Health Treatment)  Does patient have an advance directive covering mental health treatment?: Patient does not have advance directive covering mental health treatment.  Reason patient does not have an advance directive covering mental health treatment:: HCDM documented in the HCDM/Contact Info section.    Readmission Information    Have you been hospitalized in the last 30 days?: Yes  Name of Hospital: [facility]  Were you being cared for at a skilled nursing facility:: No     What day were you discharged from that hospital or facility?: 06/10/23  Number of Days between previous discharge and readmission date: 8-14 days    Type of Readmission: Related to Previous Admission    Readmission Source: Home with self care       Did the following happen with your discharge?    Did you receive a follow-up/transition call?: Yes  from whom?: Van-Niel, Si Raider, RN  Did you get your discharge medications?  : Yes       Did you go to your scheduled follow up appointment with your doctor on discharge?: No    If no why?: Readmitted prior to scheduled appointment  Which services or equipment arranged after your discharge arrived?: Home Health    Did you understand your discharge instructions?: Yes       Contributing Factors for Readmission: Worsening Clinical Condition, Complex medical history    Did you feel prepared for discharge?: Yes    Patient Information  Lives with: Spouse/significant other    Type of Residence: Private residence; single story home with 3 stairs to front entrance. Handrail present.  Type of Residence: Mailing Address:  [address]  [place] [address]  Contacts: Accompanied by: Alone  Patient Phone Number:   Telephone Information:   Mobile [phone]           Medical Provider(s): McConner, Maryln Manuel, MD  Reason for Admission: Admitting Diagnosis:  Hypokalemia [E87.6]  Muscle cramps [R25.2]  Hypotension, unspecified hypotension type [I95.9]  Past Medical History:   has a past medical history of Abnormal mammogram, Abnormal  Pap smear of cervix, Allergic, Anemia, Anxiety, Arthritis, Asthma, Back pain, Cancer (CMS-HCC) (Ovarian), Depression, Diabetes mellitus (CMS-HCC) (Borderline), Eczema, ESRD (end stage renal disease) on dialysis (CMS-HCC), FSGS (focal segmental glomerulosclerosis) (1997), GERD (gastroesophageal reflux disease), Gout, H/O adenoidectomy, Hypercholesteremia, Hypertension, Keloid, Ovarian cancer (CMS-HCC), Pancreatitis, Psoriasis, QT prolongation (06/04/2023), Seizure (CMS-HCC), Sepsis (CMS-HCC) (09/16/2022), Stroke (CMS-HCC) (2000), and Tachycardia (07/31/2020).  Past Surgical History:   has a past surgical history that includes Cholecystectomy; Appendectomy; Tonsilectomy, adenoidectomy, bilateral myringotomy and tubes; pr upper gi endoscopy,biopsy (N/A, 08/29/2017); Breast excisional biopsy (Right, 11/28/2016); Total vaginal hysterectomy (11/22/2015); Combined hysteroscopy diagnostic / D&C (07/07/2015); Left oophorectomy (Left, 04/12/2000); Salpingectomy (Left, 04/12/2000); Salpingectomy (Right, 11/22/2015); chg US guide, vascular access (N/A, 01/29/2020); pr creat av fistula,autogenous graft (Left, 01/29/2020); pr creat av fistula,non-autogenous graft (Left, 06/08/2020); chg US guide, vascular access (N/A, 06/08/2020); pr exploration n/flwd surg upper extremity artery (Left, 08/02/2020); pr remv art clot axill-brach,arm incis (Left, 08/02/2020); pr vein bypass graft,subcl-brachial (Left, 08/05/2020); pr incis/drain arm,deep absc/hematoma (Left, 08/14/2020); pr insertion tunnel intraperitoneal cath dial open (Midline, 02/01/2021); Hysterectomy; Oophorectomy; Chemotherapy; pr colsc flx w/rmvl of tumor polyp lesion snare tq (N/A, 09/12/2021); and pr colonoscopy w/biopsy single/multiple (09/12/2021).   Previous admit date: 06/03/2023    Primary Insurance- Payor: MEDICARE / Plan: MEDICARE PART A ONLY / Product Type: *No Product type* /   Secondary Insurance - None  Prescription Coverage -   Preferred Pharmacy - [facility] - [place], [place] - [address]  [facility]  [facility]  [facility] [place] [address]  [facility] - [place], [place] - [address]  [facility] - [place], [place] - [address]    Transportation home: Private vehicle; spouse will transport home.     Support Systems/Concerns: Spouse, Family Members    Responsibilities/Dependents at home?: Yes (Describe) (2 dogs)    Home Care services in place prior to admission?: Yes  Type of Home Care services in place prior to admission: Home OT, Home PT, Home nursing visits  Current Home Care provider (Name/Phone #): [facility] at [phone].          Equipment Currently Used at Home: cane, straight, tub bench, walker, rolling       Currently receiving outpatient dialysis?: Yes  Facility providing dialysis (Name/Contact Info): [facility] - [address] [place], [place] [address]; [phone], [phone]    Financial Information       Need for financial assistance?: No       Social Determinants of Health  Social Determinants of Health     Financial Resource Strain: Low Risk  (06/26/2023)    Overall Financial Resource Strain (CARDIA)     Difficulty of Paying Living Expenses: Not hard at all   Internet Connectivity: Not on file   Food Insecurity: No Food Insecurity (06/26/2023)    Hunger Vital Sign     Worried About Running Out of Food in the Last Year: Never true     Ran Out of Food in the Last Year: Never true   Tobacco Use: Medium Risk (06/26/2023)    Patient History     Smoking Tobacco Use: Former     Smokeless Tobacco Use: Never     Passive Exposure: Not on file   Housing/Utilities: Low Risk  (06/26/2023)    Housing/Utilities     Within the past 12 months, have you ever stayed: outside,  in a car, in a tent, in an overnight shelter, or temporarily in someone else's home (i.e. couch-surfing)?: No     Are you worried about losing your housing?: No     Within the past 12 months, have you been unable to get utilities (heat, electricity) when it was really needed?: No   Alcohol Use: Not on file   Transportation Needs: No Transportation Needs (06/26/2023)    PRAPARE - Transportation     Lack of Transportation (Medical): No     Lack of Transportation (Non-Medical): No   Substance Use: Not on file   Health Literacy: Low Risk  (01/27/2021)    Health Literacy     : Never   Physical Activity: Not on file Interpersonal Safety: Unknown (06/26/2023)    Interpersonal Safety     Unsafe Where You Currently Live: Not on file     Physically Hurt by Anyone: Not on file     Abused by Anyone: Not on file   Stress: Not on file   Intimate Partner Violence: Not on file   Depression: Not at risk (11/22/2022)    PHQ-2     PHQ-2 Score: 1   Social Connections: Not on file       Complex Discharge Information       Interventions:     Discharge Needs Assessment  Concerns to be Addressed: discharge planning    Clinical Risk Factors: Multiple Diagnoses (Chronic), Dialysis, Readmission < 30 Days    Barriers to taking medications: No    Prior overnight hospital stay or ED visit in last 90 days: Yes       Anticipated Changes Related to Illness: other (see comments) (tbd)    Equipment Needed After Discharge: other (see comments) (tbd)    Discharge Facility/Level of Care Needs:      Readmission  Risk of Unplanned Readmission Score: UNPLANNED READMISSION SCORE: 30.74%  Predictive Model Details          31% (High)  Factor Value    Calculated 06/26/2023 16:03 21% Number of active inpatient medication orders 41    [place] Risk of Unplanned Readmission Model 8% Number of ED visits in last six months 2     7% Number of hospitalizations in last year 2     7% ECG/EKG order present in last 6 months     7% Latest calcium low (8.4 mg/dL)     6% Latest BUN high (40 mg/dL)     6% Diagnosis of electrolyte disorder present     5% Imaging order present in last 6 months     4% Latest hemoglobin low (8.6 g/dL)     4% Phosphorous result present     3% Diagnosis of deficiency anemia present     3% Active anticoagulant inpatient medication order present     3% Active corticosteroid inpatient medication order present     3% Latest creatinine high (13.29 mg/dL)     3% Age 52     3% Diagnosis of renal failure present     3% Current length of stay 3.602 days     3% Charlson Comorbidity Index 3     1% Future appointment scheduled      Readmitted Within the Last 30 Days? (No if blank) Yes  Patient at risk for readmission?: Yes    Discharge Plan  Screen findings are: Discharge planning needs identified or anticipated (Comment). (resume HH PT/OT/RN; confirm dialysis center appt)    Expected Discharge Date: 06/28/2023  Expected Transfer from Critical Care:      Quality data for continuing care services shared with patient and/or representative?: N/A  Patient and/or family were provided with choice of facilities / services that are available and appropriate to meet post hospital care needs?: N/A       Initial Assessment complete?: Yes

## 2023-06-26 NOTE — Unmapped (Signed)
ADULT PERITONEAL DIALYSIS TREATMENT NOTE    PROCEDURE DATE/TIME:    06/25/23 8:51 PM PD THERAPY DAY:  3 PD DEVICE:   ([id number])     THERAPY TYPE:  Continuous Cycling Peritoneal Dialysis - Standard     CONSENT:    Written consent was obtained prior to the procedure and is detailed in the medical record. Prior to the start of the procedure, a time out was taken and the identity of the patient was confirmed via name, medical record number and date of birth.    Active Dialysis Orders (168h ago, onward)       Start     Ordered    06/25/23 1923  Peritoneal Dialysis - CCPD Standard  Daily      Question Answer Comment   Therapy Time (hours) Other (please specify) 15   Total Number of Cycles 5    Exchange Volume (L) 2.5L    Day dwell/Last fill (mL) 1500    Dialysate last fill with bag as ordered        06/25/23 1922                    VITAL SIGNS:  Vitals:    06/25/23 2043   BP: 101/63   Pulse: 99   Resp: 21   Temp: 36.7 ??C (98.1 ??F)   SpO2: 100%    Vitals:    06/23/23 0256 06/23/23 1752   Weight: 80.2 kg (176 lb 12.9 oz) 81.1 kg (178 lb 10.9 oz)        LAB RESULTS:    Potassium   Date Value Ref Range Status   06/25/2023 3.5 3.4 - 4.8 mmol/L Final       DIALYSATE FLUID:  Dianeal Solution: Dextrose 1.5% Calcium 3.5 mEq/L   Additives:  None    ACCESS:  Peritoneal Dialysis Catheter Continuous cycling Left lower abdomen (Active)       Peritoneal Dialysis Catheter Left lower abdomen (Active)   Site Assessment Clean;Dry;Intact 06/25/23 1953   Dressing Occlusive 06/25/23 1953   Dressing Status      Clean;Dry;Intact/not removed 06/25/23 1953   Dressing Change Due 06/30/23 06/25/23 1953   Dressing Intervention No intervention needed 06/25/23 1953   Status Accessed 06/25/23 1953   PD Catheter Transfer Set Fresenius Connector 06/25/23 1953     Patient Lines/Drains/Airways Status       Active Peripheral & Central Intravenous Access       Name Placement date Placement time Site Days    Peripheral IV 06/25/23 Anterior;Right Forearm 06/25/23 1326  Forearm  less than 1                    SETTINGS:  Mode: Standard Minimum Drain Volume (%): 85%   Smart Dwells: Yes Heater Bag Empty: No   Tidal Full Drains: No Flush: Yes   Program Locked: No I-Drain Alarm (mL): 0 mL   Program Verfied: Yes     Lines Unclamped:  Yes.      INITIAL DRAIN:    Inital Outflow Effluent Appearance: Clear Initial Drain Volume (mL): 1701 mL     THERAPY DETAILS:  Peritoneal Dialysis Fill Volume (ml): 2500 ml Peritoneal Dialysis Total Volume (ml): 14000 ml   Average Dwell Time (Minutes): 135 Minutes Effluent Appearance: Clear     EXCHANGE NET BALANCE:    PD Net Exchange Output (mL): 551 ml         DIALYSIS ON-CALL NURSE PAGER NUMBER:  Monday thru Friday 0700 -  1730: Call the Dialysis Unit ext. [phone]   After 1730 and all day Sunday: Call the Dialysis RN Pager Number [phone]     PROCEDURE REVIEW, VERIFICATION, HANDOFF:  PD settings verified, procedure reviewed, and instructions given to primary RN.  Dialysis RN Verifying: Nicholes Rough RN Primary PD RN Verifying: Wanita Chamberlain RN

## 2023-06-26 NOTE — Unmapped (Signed)
Nephrology ([facility]) Progress Note    Assessment & Plan:   Megan Robertson is a 52 y.o. 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 Blood pressure responded well to IVF in the ED and she also notes feeling a little better. Denies fevers but has been a little chilly at home. No cough or URI symptoms. Does note dysuria but only producing very small amounts of urine (less than usual). She has been taking midodrine at home (though she does not know her meds well and husband not present to confirm). Suspect her symptoms are due to hypovolemia iso poor PO intake which was also an issue during her last admission. Will cover her empirically for UTI with CTX given her reported dysuria which has since resolved but increased leukocytosis. Patient remains with systolic pressures MAP >60, requiring further fluid resuscitation as needed.   - Continue CTX to finish 5 day course (end date: 9/5)  - Cell count low concern for infection, gram stain negative. PD fluid culture with NGTD   - PD prescription change as below  - Follow up urine culture, blood cultures   - CTM Blood pressure - small boluses as needed  - Hold bowel reg given reported frequent BMs - 3-4 for last few days, none in hospital.   - Nutrition consult, appreciate recs;   - Folate, B12 WNL, follow up  zinc levels - Nepro shakes  - Mirtazapine for appetite stimulation  - Daily CBC   - Midodrine 5 mg TID     ESRD on PD  During last admission, increased time of PD to 12-18 hours per pt due to concern for possible uremia contributing to her poor PO intake and lethargy. Her symptoms had slightly improved by time of discharge despite little improvement in BUN. She continues to report dysgeusia though BUN only 34 on admission. She has an elevation in creatinine and phosphorus and concern for symptomatic uremia. We will trial altering her PD prescription to increase PD volume and time to help with filtration. Counseled patient to consider HD if continues to have symptoms with inadequate PD.    - PD dwell time increased to 15 hours. Patient uremic despite PD. Likely in need of iHD. Plan for more involved discussion about this today.   - RFP daily - difficult stick  - Electrolytes as below  - Fungal prophylaxis with fluconazole 100 mg daily      Hypokalemia, Hypocalcemia, Muscle Cramping - Resolving  Hypokalemic to 2.9 at admission, now improved. Only mild hypocalcemia but potentially could be contributing to her reported facial cramping. Address hyperphos as below. Cramping resolved 9/1 am with repletion.  - Replete K PRN  - Continue home calcitriol  Hyperphosphatemia - Secondary Hyperparathyroidism  Phosphorus increased to 8.1.  - Sevelamer - 2400mg  TID with meals.     Deconditioning - Malnutrition  - PT/OT  - Nutrition consult     Normocytic Anemia  Hgb 10.1, improved from prior but may reflect hemoconcentration iso hypovolemia.   - Iron panel, ferritin  - B12, folate  - Zinc level given altered taste       Chronic Conditions  Hx Clotted AVF: continue home eliquis     Issues Impacting Complexity of Management:  The patient's presentation is complicated by the following clinically significant conditions requiring additional evaluation and treatment: - Hypercoagulable state requiring additional attention to DVT prophylaxis and treatment  - Chronic kidney disease POA requiring further investigation, treatment, or monitoring   - Hypokalemia POA requiring further investigation, treatment, or monitoring  - Dehydration POA requiring further investigation, treatment, or monitoring  - Volume depletion POA requiring further investigation, treatment, or monitoring  - Hypotension POA requiring further investigation, treatment, or monitoring     Daily Checklist:  Diet: Regular Diet  DVT PPx: [name]  Electrolytes: Potassium Repleted  Code Status: Full Code  Dispo: Admit to floor    Team Contact Information:   Primary Team: Nephrology (MEDB)  Primary Resident: Anastasio Champion, MD, MD  Resident's Pager: [phone] (Nephrology Intern - Tower)    Interval History:   No acute events overnight. Cramping has resolved. She has no further dysuria and made some urine this AM. She is still unsure about transitioning to HD, concerned because she had a lot of nausea during the last time she had trialed HD.    ROS: Denies headache, chest pain, shortness of breath, abdominal pain, nausea, vomiting.    Objective:   Temp:  [36.5 ??C (97.7 ??F)-37.4 ??C (99.3 ??F)] 36.6 ??C (97.9 ??F)  Heart Rate:  [88-105] 96  Resp:  [16-21] 16  BP: (86-103)/(38-69) 94/60  SpO2:  [97 %-100 %] 99 %,   Intake/Output Summary (Last 24 hours) at 06/26/2023 1107  Last data filed at 06/26/2023 0845  Gross per 24 hour   Intake 253 ml   Output 250 ml   Net 3 ml     Gen: NAD, converses   HENT: atraumatic, normocephalic  Heart: RRR  Lungs: CTAB, no crackles or wheezes  Abdomen: soft, NTND  Extremities: 1+ edema    Suzie Portela, MD  Internal Medicine, PGY-1

## 2023-06-26 NOTE — Unmapped (Signed)
[facility] Nephrology Peritoneal Dialysis Procedure Note     06/25/2023    Patient Megan Robertson was seen and examined on peritoneal dialysis    CHIEF COMPLAINT: End Stage Renal Disease    INTERVAL HISTORY: No new complaints ; tolerated higher dwell volumes and additional exchange. BUN not significantly changed.     PERITONEAL DIALYSIS PRESCRIPTION:  DIALYSATE FLUID:  Dianeal Solution: Dextrose 1.5% Calcium 3.5 mEq/L   ADDITIVES:  None    THERAPY DETAILS:  Peritoneal Dialysis Fill Volume (ml): 2500 ml Peritoneal Dialysis Total Volume (ml): 14000 ml   Average Dwell Time (Minutes): 135 Minutes (lost dwell 1 min) Effluent Appearance: Clear     EXCHANGE NET BALANCE:    PD Net Exchange Output (mL): 551 ml     PHYSICAL EXAM:  Vitals:  Temp:  [36.3 ??C (97.3 ??F)-36.9 ??C (98.4 ??F)] 36.6 ??C (97.9 ??F)  Heart Rate:  [88-111] 98  BP: (86-103)/(38-62) 86/38  MAP (mmHg):  [51-71] 51  Weights:       General: Appearing in no acute distress  Pulmonary: clear to auscultation  Cardiovascular: regular rate and rhythm  Extremities: no significant  edema  Access: PD Catheter, no erythema, no purulence, or no tenderness    LAB DATA:  Lab Results   Component Value Date    NA 143 06/25/2023    K 3.5 06/25/2023    CL 105 06/25/2023    CO2 24.0 06/25/2023    BUN 41 (H) 06/25/2023    CREATININE 13.13 (H) 06/25/2023      Lab Results   Component Value Date    HCT 27.1 (L) 06/25/2023    WBC 16.9 (H) 06/25/2023        ASSESSMENT/PLAN:  ESRD on Peritoneal Dialysis:  Continue CCPD  551 ml UF   Continue current regimen 2500dwell volume with 5 exchanges; increase dwell time - will lengthen time to 15hr. D/w pt again that may need to consider switch to iHD  Renally dose all medications     Bone Mineral Metabolism:  Lab Results   Component Value Date    CALCIUM 8.3 (L) 06/25/2023    CALCIUM 8.3 (L) 06/24/2023    Lab Results   Component Value Date    ALBUMIN 2.8 (L) 06/25/2023    ALBUMIN 2.9 (L) 06/24/2023      Lab Results   Component Value Date    PHOS 7.2 (H) 06/25/2023    PHOS 7.9 (H) 06/24/2023    Lab Results   Component Value Date    PTH 261.5 (H) 10/24/2020    PTH 173.3 (H) 08/01/2020      Continue phosphorus binder and dietary counseling.    Anemia:   Lab Results   Component Value Date    HGB 8.9 (L) 06/25/2023    HGB 9.8 (L) 06/24/2023    HGB 10.1 (L) 06/23/2023    Lab Results   Component Value Date    TRANSFERRIN 125.0 (L) 09/25/2021      Lab Results   Component Value Date    FERRITIN 425.7 (H) 06/22/2023    Lab Results   Component Value Date    LABIRON 40 06/23/2023      Follow up home ESA dose.    This procedure was fully reviewed with the patient and/or their decision-maker. The risks, benefits, and alternatives were discussed prior to the procedure. All questions were answered and written informed consent was obtained.    Megan Robertson Megan Pippins, MD  [facility] Division of Nephrology & Hypertension

## 2023-06-27 DIAGNOSIS — N186 End stage renal disease: Principal | ICD-10-CM

## 2023-06-27 DIAGNOSIS — Z992 Dependence on renal dialysis: Principal | ICD-10-CM

## 2023-06-27 LAB — RENAL FUNCTION PANEL
ALBUMIN: 2.8 g/dL — ABNORMAL LOW (ref 3.4–5.0)
ANION GAP: 13 mmol/L (ref 5–14)
BLOOD UREA NITROGEN: 42 mg/dL — ABNORMAL HIGH (ref 9–23)
BUN / CREAT RATIO: 3
CALCIUM: 8.7 mg/dL (ref 8.7–10.4)
CHLORIDE: 106 mmol/L (ref 98–107)
CO2: 24 mmol/L (ref 20.0–31.0)
CREATININE: 13.56 mg/dL — ABNORMAL HIGH
EGFR CKD-EPI (2021) FEMALE: 3 mL/min/{1.73_m2} — ABNORMAL LOW (ref >=60)
GLUCOSE RANDOM: 86 mg/dL (ref 70–179)
PHOSPHORUS: 7.2 mg/dL — ABNORMAL HIGH (ref 2.4–5.1)
POTASSIUM: 3.2 mmol/L — ABNORMAL LOW (ref 3.4–4.8)
SODIUM: 143 mmol/L (ref 135–145)

## 2023-06-27 LAB — CBC W/ AUTO DIFF
BASOPHILS ABSOLUTE COUNT: 0.1 10*9/L (ref 0.0–0.1)
BASOPHILS RELATIVE PERCENT: 0.6 %
EOSINOPHILS ABSOLUTE COUNT: 1 10*9/L — ABNORMAL HIGH (ref 0.0–0.5)
EOSINOPHILS RELATIVE PERCENT: 7 %
HEMATOCRIT: 27.1 % — ABNORMAL LOW (ref 34.0–44.0)
HEMOGLOBIN: 8.9 g/dL — ABNORMAL LOW (ref 11.3–14.9)
LYMPHOCYTES ABSOLUTE COUNT: 2.6 10*9/L (ref 1.1–3.6)
LYMPHOCYTES RELATIVE PERCENT: 17.7 %
MEAN CORPUSCULAR HEMOGLOBIN CONC: 32.7 g/dL (ref 32.0–36.0)
MEAN CORPUSCULAR HEMOGLOBIN: 29.8 pg (ref 25.9–32.4)
MEAN CORPUSCULAR VOLUME: 91.3 fL (ref 77.6–95.7)
MEAN PLATELET VOLUME: 7.4 fL (ref 6.8–10.7)
MONOCYTES ABSOLUTE COUNT: 0.8 10*9/L (ref 0.3–0.8)
MONOCYTES RELATIVE PERCENT: 5.6 %
NEUTROPHILS ABSOLUTE COUNT: 10.2 10*9/L — ABNORMAL HIGH (ref 1.8–7.8)
NEUTROPHILS RELATIVE PERCENT: 69.1 %
PLATELET COUNT: 385 10*9/L (ref 150–450)
RED BLOOD CELL COUNT: 2.97 10*12/L — ABNORMAL LOW (ref 3.95–5.13)
RED CELL DISTRIBUTION WIDTH: 13.9 % (ref 12.2–15.2)
WBC ADJUSTED: 14.7 10*9/L — ABNORMAL HIGH (ref 3.6–11.2)

## 2023-06-27 LAB — MAGNESIUM: MAGNESIUM: 1.9 mg/dL (ref 1.6–2.6)

## 2023-06-27 MED ADMIN — lidocaine-EPINEPHrine (XYLOCAINE W/EPI) 1 %-1:100,000 injection: INTRADERMAL | @ 20:00:00 | Stop: 2023-06-27

## 2023-06-27 MED ADMIN — lidocaine-EPINEPHrine (XYLOCAINE W/EPI) 1 %-1:100,000 injection: INTRADERMAL | @ 19:00:00 | Stop: 2023-06-27

## 2023-06-27 MED ADMIN — midazolam (VERSED) injection: INTRAVENOUS | @ 19:00:00 | Stop: 2023-06-27

## 2023-06-27 MED ADMIN — apixaban (ELIQUIS) tablet 2.5 mg: 2.5 mg | ORAL | @ 01:00:00

## 2023-06-27 MED ADMIN — fentaNYL (PF) (SUBLIMAZE) injection: INTRAVENOUS | @ 19:00:00 | Stop: 2023-06-27

## 2023-06-27 MED ADMIN — cefTRIAXone (ROCEPHIN) 1 g in sodium chloride 0.9 % (NS) 100 mL IVPB-MBP: 1 g | INTRAVENOUS | @ 08:00:00 | Stop: 2023-06-27

## 2023-06-27 MED ADMIN — ceFAZolin (ANCEF) injection: INTRAVENOUS | @ 19:00:00 | Stop: 2023-06-27

## 2023-06-27 MED ADMIN — mirtazapine (REMERON) tablet 15 mg: 15 mg | ORAL | @ 01:00:00

## 2023-06-27 MED ADMIN — hydrocortisone 2.5 % cream: TOPICAL | @ 01:00:00

## 2023-06-27 MED ADMIN — sodium chloride (NS) 0.9 % infusion: INTRAVENOUS | @ 18:00:00 | Stop: 2023-06-27

## 2023-06-27 MED ADMIN — heparin (porcine) 1000 unit/mL injection: INTRA_ARTERIAL | @ 20:00:00 | Stop: 2023-06-27

## 2023-06-27 MED ADMIN — midodrine (PROAMATINE) tablet 5 mg: 5 mg | ORAL | @ 09:00:00

## 2023-06-27 MED ADMIN — fentaNYL (PF) (SUBLIMAZE) injection: INTRAVENOUS | @ 20:00:00 | Stop: 2023-06-27

## 2023-06-27 MED ADMIN — nystatin (MYCOSTATIN) powder: TOPICAL | @ 01:00:00

## 2023-06-27 MED ADMIN — midodrine (PROAMATINE) tablet 5 mg: 5 mg | ORAL | @ 01:00:00

## 2023-06-27 MED ADMIN — heparin (porcine) 1000 unit/mL injection: INTRAVENOUS | @ 20:00:00 | Stop: 2023-06-27

## 2023-06-27 NOTE — Unmapped (Signed)
[facility] INTERVENTIONAL RADIOLOGY - Operative Note     VIR Post-Procedure Note    Procedure Name: Placement of tunneled hemodialysis catheter    Pre-Op Diagnosis: Renal dysfunction    Post-Op Diagnosis: Same as pre-operative diagnosis    VIR Providers    Attending Physician: Dr. Ammie Dalton    Fellow/Resident: Marvis Moeller, MD    Time out: Prior to the procedure, a time out was performed with all team members present. During the time out, the patient, procedure and procedure site when applicable were verbally verified by the team members and Dr. Cathie Hoops.    Description of procedure: Successful placement of tunneled hemodialysis catheter via right internal jugular access.    Sedation:Moderate sedation    Estimated Blood Loss: approximately <10 mL  Complications: None    See detailed procedure note with images in PACS [facility]).    The patient tolerated the procedure well without incident or complication and left the room in stable condition.    PLAN  Line may be used immediately.    Marvis Moeller, MD  06/27/2023 4:12 PM

## 2023-06-27 NOTE — Unmapped (Signed)
A&O x : 4  Oxygen status: room air  Telemetry: NS  Vital signs: stable  Ambulation status: independent  Pain: denies  Urine output this shift:  BM this shift: 2 counts of loose stool  Fall this shift: none  Plan: IR tomorrow for HD cath placement       Problem: Adult Inpatient Plan of Care  Goal: Plan of Care Review  06/26/2023 1908 by Veva Holes, RN  Outcome: Progressing  06/26/2023 1908 by Veva Holes, RN  Outcome: Progressing  Goal: Patient-Specific Goal (Individualized)  06/26/2023 1908 by Veva Holes, RN  Outcome: Progressing  06/26/2023 1908 by Veva Holes, RN  Outcome: Progressing  Goal: Absence of Hospital-Acquired Illness or Injury  06/26/2023 1908 by Veva Holes, RN  Outcome: Progressing  06/26/2023 1908 by Veva Holes, RN  Outcome: Progressing  Intervention: Identify and Manage Fall Risk  Recent Flowsheet Documentation  Taken 06/26/2023 1000 by Veva Holes, RN  Safety Interventions:   lighting adjusted for tasks/safety   low bed   nonskid shoes/slippers when out of bed  Intervention: Prevent Skin Injury  Recent Flowsheet Documentation  Taken 06/26/2023 1000 by Veva Holes, RN  Positioning for Skin: Supine/Back  Device Skin Pressure Protection: adhesive use limited  Skin Protection: adhesive use limited  Intervention: Prevent and Manage VTE (Venous Thromboembolism) Risk  Recent Flowsheet Documentation  Taken 06/26/2023 1000 by Veva Holes, RN  VTE Prevention/Management:   ambulation promoted   anticoagulant therapy   bleeding precautions maintained   bleeding risk factors identified   dorsiflexion/plantar flexion performed  Goal: Optimal Comfort and Wellbeing  06/26/2023 1908 by Veva Holes, RN  Outcome: Progressing  06/26/2023 1908 by Veva Holes, RN  Outcome: Progressing  Goal: Readiness for Transition of Care  06/26/2023 1908 by Veva Holes, RN  Outcome: Progressing  06/26/2023 1908 by Veva Holes, RN  Outcome: Progressing  Goal: Rounds/Family Conference  06/26/2023 1908 by Veva Holes, RN  Outcome: Progressing  06/26/2023 1908 by Veva Holes, RN  Outcome: Progressing     Problem: Fall Injury Risk  Goal: Absence of Fall and Fall-Related Injury  06/26/2023 1908 by Veva Holes, RN  Outcome: Progressing  06/26/2023 1908 by Veva Holes, RN  Outcome: Progressing  Intervention: Promote Injury-Free Environment  Recent Flowsheet Documentation  Taken 06/26/2023 1000 by Veva Holes, RN  Safety Interventions:   lighting adjusted for tasks/safety   low bed   nonskid shoes/slippers when out of bed     Problem: Peritoneal Dialysis  Goal: Optimize Fluid and Electrolyte Balance  06/26/2023 1908 by Veva Holes, RN  Outcome: Progressing  06/26/2023 1908 by Veva Holes, RN  Outcome: Progressing  Goal: Absence of Infection Signs and Symptoms  06/26/2023 1908 by Veva Holes, RN  Outcome: Progressing  06/26/2023 1908 by Veva Holes, RN  Outcome: Progressing  Goal: Safe, Effective Therapy Delivery  06/26/2023 1908 by Veva Holes, RN  Outcome: Progressing  06/26/2023 1908 by Veva Holes, RN  Outcome: Progressing     Problem: Wound  Goal: Optimal Coping  06/26/2023 1908 by Veva Holes, RN  Outcome: Progressing  06/26/2023 1908 by Veva Holes, RN  Outcome: Progressing  Goal: Optimal Functional Ability  06/26/2023 1908 by Veva Holes, RN  Outcome: Progressing  06/26/2023 1908 by Veva Holes, RN  Outcome: Progressing  Intervention: Optimize Functional Ability  Recent Flowsheet Documentation  Taken 06/26/2023 1000 by Veva Holes, RN  Activity Management:   ambulated in room   ambulated to bathroom  Goal: Absence of Infection Signs and Symptoms  06/26/2023  1908 by Veva Holes, RN  Outcome: Progressing  06/26/2023 1908 by Veva Holes, RN  Outcome: Progressing  Goal: Improved Oral Intake  Outcome: Progressing  Goal: Optimal Pain Control and Function  Outcome: Progressing  Goal: Skin Health and Integrity  Outcome: Progressing  Intervention: Optimize Skin Protection  Recent Flowsheet Documentation  Taken 06/26/2023 1000 by Veva Holes, RN  Activity Management:   ambulated in room   ambulated to bathroom  Pressure Reduction Techniques: frequent weight shift encouraged  Pressure Reduction Devices: positioning supports utilized  Skin Protection: adhesive use limited  Goal: Optimal Wound Healing  Outcome: Progressing     Problem: Electrolyte Imbalance  Goal: Electrolyte Balance  Outcome: Progressing     Problem: Pain Acute  Goal: Optimal Pain Control and Function  Outcome: Progressing

## 2023-06-27 NOTE — Unmapped (Signed)
ADULT PERITONEAL DIALYSIS TREATMENT NOTE    PROCEDURE DATE/TIME:    06/26/23 11:48 PM PD THERAPY DAY:  4 PD DEVICE:   ([id number])     THERAPY TYPE:  Continuous Cycling Peritoneal Dialysis - Standard     CONSENT:    Written consent was obtained prior to the procedure and is detailed in the medical record. Prior to the start of the procedure, a time out was taken and the identity of the patient was confirmed via name, medical record number and date of birth.    Active Dialysis Orders (168h ago, onward)       Start     Ordered    06/25/23 1923  Peritoneal Dialysis - CCPD Standard  Daily      Question Answer Comment   Therapy Time (hours) Other (please specify) 15   Total Number of Cycles 5    Exchange Volume (L) 2.5L    Day dwell/Last fill (mL) 1500    Dialysate last fill with bag as ordered        06/25/23 1922                    VITAL SIGNS:  Vitals:    06/26/23 1951   BP: 97/57   Pulse: 98   Resp:    Temp:    SpO2: 99%    Vitals:    06/23/23 0256 06/23/23 1752   Weight: 80.2 kg (176 lb 12.9 oz) 81.1 kg (178 lb 10.9 oz)        LAB RESULTS:    Potassium   Date Value Ref Range Status   06/26/2023 3.5 3.4 - 4.8 mmol/L Final       DIALYSATE FLUID:  Dianeal Solution: Dextrose 1.5% Calcium 3.5 mEq/L   Additives:  None    ACCESS:  Peritoneal Dialysis Catheter Continuous cycling Left lower abdomen (Active)       Peritoneal Dialysis Catheter Left lower abdomen (Active)   Site Assessment Clean;Dry;Intact 06/26/23 2135   Dressing Occlusive 06/26/23 2135   Dressing Status      Clean;Dry;Intact/not removed 06/26/23 2135   Dressing Change Due 06/30/23 06/26/23 2135   Dressing Intervention No intervention needed 06/26/23 2135   Status Accessed 06/26/23 1945   PD Catheter Transfer Set [employer] Connector 06/26/23 1945     Patient Lines/Drains/Airways Status       Active Peripheral & Central Intravenous Access       Name Placement date Placement time Site Days    Peripheral IV 06/25/23 Anterior;Right Forearm 06/25/23  1326  Forearm  1 SETTINGS:  Mode: Standard Minimum Drain Volume (%): 85%   Smart Dwells: Yes Heater Bag Empty: No   Tidal Full Drains: No Flush: Yes   Program Locked: No I-Drain Alarm (mL): 0 mL   Program Verfied: Yes     Lines Unclamped:  Yes.      INITIAL DRAIN:    Inital Outflow Effluent Appearance: Clear Initial Drain Volume (mL): 1516 mL     THERAPY DETAILS:  Peritoneal Dialysis Fill Volume (ml): 2500 ml Peritoneal Dialysis Total Volume (ml): 14000 ml   Average Dwell Time (Minutes): 146 Minutes (lost dwell 1 minute) Effluent Appearance: Clear     EXCHANGE NET BALANCE:    PD Net Exchange Output (mL): 510 ml         DIALYSIS ON-CALL NURSE PAGER NUMBER:  Monday thru Friday 0700 - 1730: Call the Dialysis Unit ext. [phone]   After 1730 and all day Sunday: Call the Dialysis  RN Pager Number [phone]     PROCEDURE REVIEW, VERIFICATION, HANDOFF:  PD settings verified, procedure reviewed, and instructions given to primary RN.  Dialysis RN Verifying: Nicholes Rough RN Primary PD RN Verifying: Evalee Mutton, RN

## 2023-06-27 NOTE — Unmapped (Signed)
Pt is alert and oriented x 4. Pt remains free from falls this shift. Bed lowered and locked with call bell in reach. Pt denies pain this shift. I and O's monitored. Pt remains on tele this shift. Pt remains on Pd throughout night. IV antibiotics given. Pt encouraged to use call bell this shift for assistance.   Problem: Adult Inpatient Plan of Care  Goal: Plan of Care Review  Outcome: Progressing  Goal: Patient-Specific Goal (Individualized)  Outcome: Progressing  Goal: Absence of Hospital-Acquired Illness or Injury  Outcome: Progressing  Intervention: Identify and Manage Fall Risk  Recent Flowsheet Documentation  Taken 06/26/2023 1920 by Vianne Bulls, RN  Safety Interventions:   fall reduction program maintained   lighting adjusted for tasks/safety   low bed   nonskid shoes/slippers when out of bed  Intervention: Prevent Skin Injury  Recent Flowsheet Documentation  Taken 06/26/2023 1920 by Vianne Bulls, RN  Positioning for Skin: Supine/Back  Device Skin Pressure Protection: adhesive use limited  Skin Protection: adhesive use limited  Intervention: Prevent and Manage VTE (Venous Thromboembolism) Risk  Recent Flowsheet Documentation  Taken 06/26/2023 2136 by Vianne Bulls, RN  VTE Prevention/Management:   anticoagulant therapy   ambulation promoted  Goal: Optimal Comfort and Wellbeing  Outcome: Progressing  Goal: Readiness for Transition of Care  Outcome: Progressing  Goal: Rounds/Family Conference  Outcome: Progressing

## 2023-06-27 NOTE — Unmapped (Signed)
Nephrology ([facility]) Progress Note    Assessment & Plan:   Megan Robertson is a 52 y.o. 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) that presented to [facility] with hypotension, electrolyte derangement     Principal Problem:    Hypotension  Active Problems:    Hypertension    Leukocytosis    Anemia    Hypokalemia    ESRD (end stage renal disease) on dialysis (CMS-HCC)    Hyperphosphatemia    Poor fluid intake    Hypovolemia    Dysgeusia    Fatigue    Dysuria    Physical deconditioning    Malnutrition (CMS-HCC)      Active Problems    Hypotension - Poor PO Intake, Altered Taste - Fatigue - Dysuria, Leukocytosis   She was recently admitted for a similar presentation (hypotension, lethargy at that time). Blood pressure responded well to IVF in the ED and she also notes feeling a little better. Denies fevers but has been a little chilly at home. No cough or URI symptoms. Does note dysuria but only producing very small amounts of urine (less than usual). She has been taking midodrine at home (though she does not know her meds well and husband not present to confirm). Suspect her symptoms are due to hypovolemia iso poor PO intake which was also an issue during her last admission. Will cover her empirically for UTI with CTX given her reported dysuria which has since resolved but increased leukocytosis. Patient remains with systolic pressures MAP >60, requiring further fluid resuscitation as needed.   - Completed five-day CTX course 9/5  - Cell count low concern for infection, gram stain negative. PD fluid culture with NGTD   - PD prescription change as below  - Follow up urine culture, blood cultures   - CTM Blood pressure - small boluses as needed  - Hold bowel reg given reported frequent BMs - 3-4 for last few days, none in hospital.   - Nutrition consult, appreciate recs;  - Folate, B12 WNL, follow up  zinc levels   - Nepro shakes  - Mirtazapine for appetite stimulation  - Daily CBC  - Midodrine 5 mg TID     ESRD on PD  During last admission, increased time of PD to 12-18 hours per pt due to concern for possible uremia contributing to her poor PO intake and lethargy. Her symptoms had slightly improved by time of discharge despite little improvement in BUN. She continues to report dysgeusia though BUN only 34 on admission. She has an elevation in creatinine and phosphorus and concern for symptomatic uremia. We trialed altering her PD prescription to increase PD volume and time to help with filtration, but she continued with uremia. Counseled patient to consider HD if continues to have symptoms with inadequate PD.   - She is amenable for iHD switch and planning for tunneled line placement today 9/5  - RFP daily - difficult stick  - Electrolytes as below  - Discontinued fungal prophylaxis     Hypokalemia, Hypocalcemia, Muscle Cramping - Resolving  Hypokalemic to 2.9 at admission, now improved. Only mild hypocalcemia but potentially could be contributing to her reported facial cramping. Address hyperphos as below. Cramping resolved 9/1 am with repletion.  - Replete K PRN  - Continue home calcitriol     Hyperphosphatemia - Secondary Hyperparathyroidism  Phosphorus increased to 8.1.  - Sevelamer - 2400mg  TID with meals.  Deconditioning - Malnutrition  - PT/OT  - Nutrition consult     Normocytic Anemia  Hgb 10.1, improved from prior but may reflect hemoconcentration iso hypovolemia.   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     Issues Impacting Complexity of Management:  The patient's presentation is complicated by the following clinically significant conditions requiring additional evaluation and treatment: - Hypercoagulable state requiring additional attention to DVT prophylaxis and treatment  - Chronic kidney disease POA requiring further investigation, treatment, or monitoring   - Hypokalemia POA requiring further investigation, treatment, or monitoring  - Dehydration POA requiring further investigation, treatment, or monitoring  - Volume depletion POA requiring further investigation, treatment, or monitoring  - Hypotension POA requiring further investigation, treatment, or monitoring     Daily Checklist:  Diet: Regular Diet  DVT PPx: [name]  Electrolytes: Potassium Repleted  Code Status: Full Code  Dispo: Admit to floor    Team Contact Information:   Primary Team: Nephrology ([facility])  Primary Resident: Anastasio Champion, MD, MD  Resident's Pager: [phone] (Nephrology Intern - Tower)    Interval History:   No acute events overnight. Cramping has resolved. She has no further dysuria and made some urine this AM. She is open to transitioning to HD and aware of plans to receive a tunneled line today.     ROS: Denies headache, chest pain, shortness of breath, abdominal pain, nausea, vomiting.    Objective:   Temp:  [36.4 ??C (97.5 ??F)-37 ??C (98.6 ??F)] 36.6 ??C (97.9 ??F)  Heart Rate:  [88-100] 96  Resp:  [16-18] 18  BP: (88-117)/(52-63) 117/56  SpO2:  [98 %-100 %] 98 %,   Intake/Output Summary (Last 24 hours) at 06/27/2023 1421  Last data filed at 06/27/2023 1108  Gross per 24 hour   Intake 235 ml   Output 522 ml   Net -287 ml     Gen: NAD, converses   HENT: atraumatic, normocephalic  Heart: RRR  Lungs: CTAB, no crackles or wheezes  Abdomen: soft, NTND  Extremities: 1+ edema  Skin: white flaky skin appreciated on BL upper and lower extremities     Suzie Portela, MD  Internal Medicine, PGY-1

## 2023-06-28 LAB — CBC W/ AUTO DIFF
BASOPHILS ABSOLUTE COUNT: 0 10*9/L (ref 0.0–0.1)
BASOPHILS RELATIVE PERCENT: 0.2 %
EOSINOPHILS ABSOLUTE COUNT: 0.7 10*9/L — ABNORMAL HIGH (ref 0.0–0.5)
EOSINOPHILS RELATIVE PERCENT: 5.5 %
HEMATOCRIT: 24 % — ABNORMAL LOW (ref 34.0–44.0)
HEMOGLOBIN: 7.8 g/dL — ABNORMAL LOW (ref 11.3–14.9)
LYMPHOCYTES ABSOLUTE COUNT: 2.1 10*9/L (ref 1.1–3.6)
LYMPHOCYTES RELATIVE PERCENT: 15.7 %
MEAN CORPUSCULAR HEMOGLOBIN CONC: 32.6 g/dL (ref 32.0–36.0)
MEAN CORPUSCULAR HEMOGLOBIN: 30 pg (ref 25.9–32.4)
MEAN CORPUSCULAR VOLUME: 92 fL (ref 77.6–95.7)
MEAN PLATELET VOLUME: 6.8 fL (ref 6.8–10.7)
MONOCYTES ABSOLUTE COUNT: 1.2 10*9/L — ABNORMAL HIGH (ref 0.3–0.8)
MONOCYTES RELATIVE PERCENT: 8.8 %
NEUTROPHILS ABSOLUTE COUNT: 9.2 10*9/L — ABNORMAL HIGH (ref 1.8–7.8)
NEUTROPHILS RELATIVE PERCENT: 69.8 %
PLATELET COUNT: 269 10*9/L (ref 150–450)
RED BLOOD CELL COUNT: 2.61 10*12/L — ABNORMAL LOW (ref 3.95–5.13)
RED CELL DISTRIBUTION WIDTH: 13.9 % (ref 12.2–15.2)
WBC ADJUSTED: 13.2 10*9/L — ABNORMAL HIGH (ref 3.6–11.2)

## 2023-06-28 LAB — RENAL FUNCTION PANEL
ALBUMIN: 2.3 g/dL — ABNORMAL LOW (ref 3.4–5.0)
ANION GAP: 15 mmol/L — ABNORMAL HIGH (ref 5–14)
BLOOD UREA NITROGEN: 29 mg/dL — ABNORMAL HIGH (ref 9–23)
BUN / CREAT RATIO: 3
CALCIUM: 8.2 mg/dL — ABNORMAL LOW (ref 8.7–10.4)
CHLORIDE: 104 mmol/L (ref 98–107)
CO2: 28 mmol/L (ref 20.0–31.0)
CREATININE: 9.33 mg/dL — ABNORMAL HIGH
EGFR CKD-EPI (2021) FEMALE: 5 mL/min/{1.73_m2} — ABNORMAL LOW (ref >=60)
GLUCOSE RANDOM: 96 mg/dL (ref 70–179)
PHOSPHORUS: 5.9 mg/dL — ABNORMAL HIGH (ref 2.4–5.1)
POTASSIUM: 3.7 mmol/L (ref 3.4–4.8)
SODIUM: 147 mmol/L — ABNORMAL HIGH (ref 135–145)

## 2023-06-28 LAB — HEMOGLOBIN AND HEMATOCRIT, BLOOD
HEMATOCRIT: 26.4 % — ABNORMAL LOW (ref 34.0–44.0)
HEMOGLOBIN: 8.8 g/dL — ABNORMAL LOW (ref 11.3–14.9)

## 2023-06-28 LAB — MAGNESIUM: MAGNESIUM: 1.7 mg/dL (ref 1.6–2.6)

## 2023-06-28 MED ORDER — SEVELAMER CARBONATE 800 MG TABLET
ORAL_TABLET | Freq: Three times a day (TID) | ORAL | 11 refills | 30 days | Status: CP
Start: 2023-06-28 — End: 2024-06-27
  Filled 2023-06-29: qty 270, 30d supply, fill #0

## 2023-06-28 MED ORDER — APIXABAN 2.5 MG TABLET
ORAL_TABLET | Freq: Two times a day (BID) | ORAL | 2 refills | 15 days | Status: CP
Start: 2023-06-28 — End: ?
  Filled 2023-06-29: qty 30, 15d supply, fill #0

## 2023-06-28 MED ORDER — MIRTAZAPINE 15 MG TABLET
ORAL_TABLET | Freq: Every evening | ORAL | 0 refills | 90 days | Status: CP
Start: 2023-06-28 — End: 2023-09-26
  Filled 2023-06-29: qty 90, 90d supply, fill #0

## 2023-06-28 MED ORDER — OXYCODONE 5 MG TABLET
ORAL_TABLET | ORAL | 0 refills | 3 days | Status: CP | PRN
Start: 2023-06-28 — End: 2023-07-03
  Filled 2023-06-29: qty 15, 3d supply, fill #0

## 2023-06-28 MED ORDER — MULTIVIT AND MINERALS-FERROUS GLUCONATE 9 MG IRON/15 ML ORAL LIQUID
Freq: Every day | ORAL | 0 refills | 15 days | Status: CP
Start: 2023-06-28 — End: 2023-07-14
  Filled 2023-06-29: qty 236, 15d supply, fill #0

## 2023-06-28 MED ADMIN — hydrocortisone 2.5 % cream: TOPICAL | @ 17:00:00

## 2023-06-28 MED ADMIN — potassium chloride ER tablet 20 mEq: 20 meq | ORAL | @ 02:00:00 | Stop: 2023-06-27

## 2023-06-28 MED ADMIN — gentamicin-sodium citrate lock solution in NS: 2 mL

## 2023-06-28 MED ADMIN — midodrine (PROAMATINE) tablet 5 mg: 5 mg | ORAL | @ 18:00:00

## 2023-06-28 MED ADMIN — apixaban (ELIQUIS) tablet 2.5 mg: 2.5 mg | ORAL | @ 17:00:00

## 2023-06-28 MED ADMIN — zinc sulfate (ZINCATE) capsule 220 mg: 220 mg | ORAL | @ 02:00:00 | Stop: 2023-06-29

## 2023-06-28 MED ADMIN — midodrine (PROAMATINE) tablet 5 mg: 5 mg | ORAL | @ 02:00:00

## 2023-06-28 MED ADMIN — fluticasone furoate (ARNUITY ELLIPTA) 100 mcg/actuation inhaler 1 puff: 1 | RESPIRATORY_TRACT | @ 17:00:00

## 2023-06-28 MED ADMIN — acetaminophen (TYLENOL) tablet 1,000 mg: 1000 mg | ORAL | @ 01:00:00

## 2023-06-28 MED ADMIN — sevelamer (RENVELA) tablet 2,400 mg: 2400 mg | ORAL | @ 22:00:00

## 2023-06-28 MED ADMIN — aspirin chewable tablet 81 mg: 81 mg | ORAL | @ 17:00:00

## 2023-06-28 MED ADMIN — multivitamin, therapeutic with minerals oral liquid: 15 mL | ORAL | @ 02:00:00

## 2023-06-28 MED ADMIN — sevelamer (RENVELA) tablet 2,400 mg: 2400 mg | ORAL | @ 02:00:00

## 2023-06-28 MED ADMIN — sevelamer (RENVELA) tablet 2,400 mg: 2400 mg | ORAL | @ 17:00:00

## 2023-06-28 MED ADMIN — oxyCODONE (ROXICODONE) immediate release tablet 5 mg: 5 mg | ORAL | @ 11:00:00 | Stop: 2023-07-11

## 2023-06-28 MED ADMIN — gentamicin-sodium citrate lock solution in NS: 2 mL | @ 16:00:00

## 2023-06-28 MED ADMIN — mirtazapine (REMERON) tablet 15 mg: 15 mg | ORAL | @ 02:00:00

## 2023-06-28 MED ADMIN — calcitriol (ROCALTROL) capsule 0.5 mcg: .5 ug | ORAL | @ 17:00:00

## 2023-06-28 MED ADMIN — calcitriol (ROCALTROL) capsule 0.5 mcg: .5 ug | ORAL | @ 02:00:00

## 2023-06-28 MED ADMIN — oxyCODONE (ROXICODONE) immediate release tablet 5 mg: 5 mg | ORAL | @ 04:00:00 | Stop: 2023-07-11

## 2023-06-28 MED ADMIN — midodrine (PROAMATINE) tablet 5 mg: 5 mg | ORAL | @ 10:00:00

## 2023-06-28 MED ADMIN — sevelamer (RENVELA) tablet 2,400 mg: 2400 mg | ORAL | @ 10:00:00

## 2023-06-28 MED ADMIN — gentamicin-sodium citrate lock solution in NS: 2.1 mL

## 2023-06-28 MED ADMIN — gentamicin-sodium citrate lock solution in NS: 2.1 mL | @ 16:00:00

## 2023-06-28 MED ADMIN — nystatin (MYCOSTATIN) powder: TOPICAL | @ 17:00:00

## 2023-06-28 MED ADMIN — apixaban (ELIQUIS) tablet 2.5 mg: 2.5 mg | ORAL | @ 02:00:00

## 2023-06-28 MED ADMIN — white petrolatum-mineral oil-lanolin topical cream: TOPICAL | @ 17:00:00

## 2023-06-28 MED ADMIN — zinc sulfate (ZINCATE) capsule 220 mg: 220 mg | ORAL | @ 17:00:00 | Stop: 2023-06-28

## 2023-06-28 NOTE — Unmapped (Signed)
Patient is alert and oriented. PD disconnected at 10 this morning. Was able to walk to bathroom with SBA.   VIR this afternoon for HD line placement. Then HD today, HD nurse told that she will be off at 8pm. She hasn't taken her any meds this morning as she said she wants to eat first the take pills, but she was NPO for Line placement.    Will continue to monitor her.      Problem: Adult Inpatient Plan of Care  Goal: Plan of Care Review  Outcome: Progressing  Flowsheets (Taken 06/27/2023 1806)  Progress: improving  Plan of Care Reviewed With: patient  Goal: Patient-Specific Goal (Individualized)  Outcome: Progressing  Goal: Absence of Hospital-Acquired Illness or Injury  Outcome: Progressing  Intervention: Identify and Manage Fall Risk  Recent Flowsheet Documentation  Taken 06/27/2023 0737 by Milinda Pointer, RN  Safety Interventions:   fall reduction program maintained   environmental modification   lighting adjusted for tasks/safety   low bed  Intervention: Prevent Skin Injury  Recent Flowsheet Documentation  Taken 06/27/2023 1300 by Milinda Pointer, RN  Positioning for Skin: Supine/Back  Taken 06/27/2023 0737 by Milinda Pointer, RN  Positioning for Skin: Supine/Back  Intervention: Prevent and Manage VTE (Venous Thromboembolism) Risk  Recent Flowsheet Documentation  Taken 06/27/2023 1300 by Milinda Pointer, RN  VTE Prevention/Management: anticoagulant therapy  Goal: Optimal Comfort and Wellbeing  Outcome: Progressing  Goal: Readiness for Transition of Care  Outcome: Progressing  Goal: Rounds/Family Conference  Outcome: Progressing     Problem: Fall Injury Risk  Goal: Absence of Fall and Fall-Related Injury  Outcome: Progressing  Intervention: Promote Injury-Free Environment  Recent Flowsheet Documentation  Taken 06/27/2023 0737 by Milinda Pointer, RN  Safety Interventions:   fall reduction program maintained   environmental modification   lighting adjusted for tasks/safety   low bed     Problem: Peritoneal Dialysis  Goal: Optimize Fluid and Electrolyte Balance  Outcome: Progressing  Goal: Absence of Infection Signs and Symptoms  Outcome: Progressing  Goal: Safe, Effective Therapy Delivery  Outcome: Progressing     Problem: Wound  Goal: Optimal Coping  Outcome: Progressing  Goal: Optimal Functional Ability  Outcome: Progressing  Intervention: Optimize Functional Ability  Recent Flowsheet Documentation  Taken 06/27/2023 0737 by Milinda Pointer, RN  Activity Management: ambulated in room  Goal: Absence of Infection Signs and Symptoms  Outcome: Progressing  Goal: Improved Oral Intake  Outcome: Progressing  Goal: Optimal Pain Control and Function  Outcome: Progressing  Goal: Skin Health and Integrity  Outcome: Progressing  Intervention: Optimize Skin Protection  Recent Flowsheet Documentation  Taken 06/27/2023 1300 by Milinda Pointer, RN  Pressure Reduction Techniques: frequent weight shift encouraged  Pressure Reduction Devices: pressure-redistributing mattress utilized  Taken 06/27/2023 0737 by Milinda Pointer, RN  Activity Management: ambulated in room  Pressure Reduction Techniques: frequent weight shift encouraged  Head of Bed (HOB) Positioning: HOB elevated  Pressure Reduction Devices: pressure-redistributing mattress utilized  Goal: Optimal Wound Healing  Outcome: Progressing     Problem: Electrolyte Imbalance  Goal: Electrolyte Balance  Outcome: Progressing     Problem: Pain Acute  Goal: Optimal Pain Control and Function  Outcome: Progressing     Problem: Hemodialysis  Goal: Safe, Effective Therapy Delivery  Outcome: Progressing  Goal: Effective Tissue Perfusion  Outcome: Progressing  Goal: Absence of Infection Signs and Symptoms  Outcome: Progressing

## 2023-06-28 NOTE — Unmapped (Signed)
Pt is alert and oriented x 4. Pt remains free from falls this shift. Bed lowered and locked with call bell in reach. Pt complains of pain from new HD line placement and in legs, see mar for intervention. I and O's monitored. Pt encouraged to use call bell this shift for assistance. Pt remains on tele.   Problem: Adult Inpatient Plan of Care  Goal: Plan of Care Review  Outcome: Progressing  Goal: Patient-Specific Goal (Individualized)  Outcome: Progressing  Goal: Absence of Hospital-Acquired Illness or Injury  Outcome: Progressing  Intervention: Identify and Manage Fall Risk  Recent Flowsheet Documentation  Taken 06/27/2023 1939 by Vianne Bulls, RN  Safety Interventions:   fall reduction program maintained   lighting adjusted for tasks/safety   low bed   nonskid shoes/slippers when out of bed  Intervention: Prevent Skin Injury  Recent Flowsheet Documentation  Taken 06/27/2023 1939 by Vianne Bulls, RN  Positioning for Skin: Supine/Back  Device Skin Pressure Protection: adhesive use limited  Skin Protection: adhesive use limited  Intervention: Prevent and Manage VTE (Venous Thromboembolism) Risk  Recent Flowsheet Documentation  Taken 06/27/2023 2213 by Vianne Bulls, RN  VTE Prevention/Management: anticoagulant therapy  Goal: Optimal Comfort and Wellbeing  Outcome: Progressing  Goal: Readiness for Transition of Care  Outcome: Progressing  Goal: Rounds/Family Conference  Outcome: Progressing

## 2023-06-28 NOTE — Unmapped (Signed)
Manual drain PD fluid.  clear light yellow fluid.  Minicap in place, line clamped

## 2023-06-28 NOTE — Unmapped (Signed)
Nephrology ([facility]) Progress Note    Assessment & Plan:   Megan Robertson is a 52 y.o. 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) that presented to [facility] with hypotension, electrolyte derangement     Principal Problem:    Hypotension  Active Problems:    Hypertension    Leukocytosis    Anemia    Hypokalemia    ESRD (end stage renal disease) on dialysis (CMS-HCC)    Hyperphosphatemia    Poor fluid intake    Hypovolemia    Dysgeusia    Fatigue    Dysuria    Physical deconditioning    Malnutrition (CMS-HCC)      Active Problems    Hypotension - Poor PO Intake, Altered Taste - Fatigue - Dysuria, Leukocytosis   She was recently admitted for a similar presentation (hypotension, lethargy at that time). Symptoms improved after short course of empiric abx for UTI and transition to HD. Suspect symptoms were driven by uremia.   - PD prescription change as below  - Follow up urine culture, blood cultures   - CTM Blood pressure - small boluses as needed  - Nutrition consult, appreciate recs;  - Multivitamin with minerals on dc.   - Midodrine 5 mg TID     ESRD on PD---> transitioned to IHD  During last admission, increased time of PD to 12-18 hours per pt due to concern for possible uremia contributing to her poor PO intake and lethargy. Had a tunneled line placed and underwent iHD on 9/6. Will be stable for DC on 9/7.   - Electrolytes as below     Hypokalemia, Hypocalcemia, Muscle Cramping - Resolved  Hypokalemic to 2.9 at admission, now improved. Only mild hypocalcemia but potentially could be contributing to her reported facial cramping. Address hyperphos as below. Cramping resolved 9/1 am with repletion.  - Replete K PRN  - Continue home calcitriol     Hyperphosphatemia - Secondary Hyperparathyroidism  Phosphorus increased to 8.1.  - Sevelamer - 2400mg  TID with meals.     Deconditioning - Malnutrition  - PT/OT  - Nutrition consult Normocytic Anemia  Hgb 10.1, improved from prior but may reflect hemoconcentration iso hypovolemia.   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     Issues Impacting Complexity of Management:  The patient's presentation is complicated by the following clinically significant conditions requiring additional evaluation and treatment: - Hypercoagulable state requiring additional attention to DVT prophylaxis and treatment  - Chronic kidney disease POA requiring further investigation, treatment, or monitoring   - Hypokalemia POA requiring further investigation, treatment, or monitoring  - Dehydration POA requiring further investigation, treatment, or monitoring  - Volume depletion POA requiring further investigation, treatment, or monitoring  - Hypotension POA requiring further investigation, treatment, or monitoring     Daily Checklist:  Diet: Regular Diet  DVT PPx: [name]  Electrolytes: Potassium Repleted  Code Status: Full Code  Dispo: Admit to floor    Team Contact Information:   Primary Team: Nephrology ([facility])  Primary Resident: Burgess Estelle, MD, MD  Resident's Pager: [phone] (Nephrology Intern - Tower)    Interval History:   No acute events overnight. Cramping has resolved.   Feeling well. Planning for DC home  tomorrow morning.     ROS: Denies headache, chest pain, shortness of breath, abdominal pain, nausea, vomiting.    Objective:   Temp:  [36.3 ??C (97.3 ??F)-36.8 ??C (98.2 ??F)] 36.6 ??C (97.9 ??F)  Heart Rate:  [81-108] 107  Resp:  [14-33] 18  BP: (82-134)/(41-82) 82/68  SpO2:  [97 %-100 %] 100 %,   Intake/Output Summary (Last 24 hours) at 06/28/2023 1429  Last data filed at 06/28/2023 1145  Gross per 24 hour   Intake 0 ml   Output 1 ml   Net -1 ml     Gen: NAD, converses   HENT: atraumatic, normocephalic  Heart: RRR  Lungs: CTAB, no crackles or wheezes  Abdomen: soft, NTND  Extremities: trace edema  Skin: white flaky skin appreciated on BL upper and lower extremities, consistent with uremic frost, improved from yesterday.     Latanya Maudlin, MD  PGY-3, [facility] Internal Medicine

## 2023-06-28 NOTE — Unmapped (Signed)
HEMODIALYSIS NURSE PROCEDURE NOTE       Treatment Number:  1 Room / Station:  6    Procedure Date:  06/27/23 Device Name/Number: Kobe    Total Dialysis Treatment Time:  185 Min.    CONSENT:    Written consent was obtained prior to the procedure and is detailed in the medical record.  Prior to the start of the procedure, a time out was taken and the identity of the patient was confirmed via name, medical record number and date of birth.     WEIGHT:  Hemodialysis Pre-Treatment Weights        Date/Time Pre-Treatment Weight (kg) Estimated Dry Weight (kg) Patient Goal Weight (kg) Total Goal Weight (kg)    06/27/23 1625 --  UTW,post VIR  --  TBD  0 kg (0 lb)  0.5 kg (1 lb 1.6 oz)                    Hemodialysis Post Treatment Weights        Date/Time Post-Treatment Weight (kg) Treatment Weight Change (kg)    06/27/23 2000 --  UTW due to weakness  --                   Active Dialysis Orders (168h ago, onward)       Start     Ordered    06/28/23 0600  Hemodialysis inpatient  Daily      Question Answer Comment   Patient HD Status: Chronic    New Start? Yes Prev PD   K+ 3 meq/L    Ca++ 2.5 meq/L    Bicarb 35 meq/L    Na+ 137 meq/L    Na+ Modeling No    Dialyzer [id number]    Dialysate Temperature (C) 36    BFR-As tolerated to a maximum of: Other (please specify) 250   DFR 500 mL/min    Duration of treatment 3 Hr    Dry weight (kg) TBD    Challenge dry weight (kg) No    Fluid removal (L) 0L    Tubing Adult = 142 ml    Access Site Dialysis Catheter    Access Site Location Right    Keep SBP >: 90        06/27/23 1415                  ASSESSMENT:  General appearance: alert  Neurologic: Alert and oriented X 3, normal strength and tone. Normal symmetric reflexes. Normal coordination and gait  Lungs: clear to auscultation bilaterally  Heart: regular rate and rhythm, S1, S2 normal, no murmur, click, rub or gallop  Abdomen: soft, non-tender; bowel sounds normal; no masses,  no organomegaly  Pulses: 2+ and symmetric.  Skin: Skin color, texture, turgor normal. No rashes or lesions    ACCESS SITE:       Hemodialysis Catheter 06/27/23 Venovenous catheter Right Internal jugular 2 mL 2.1 mL (Active)   Site Assessment Clean;Dry;Intact 06/27/23 2000   Proximal Lumen Status / Patency Capped;Gentamicin Citrate Locked 06/27/23 2000   Proximal Lumen Intervention Deaccessed 06/27/23 2000   Medial Lumen Status / Patency Blood Return - Brisk 06/27/23 1645   Medial Lumen Intervention Deaccessed 06/27/23 2000   Dressing Status      Clean;Dry;Intact/not removed 06/27/23 2000   Verification by X-ray Yes 06/27/23 2000   Dressing Type CHG gel;Occlusive;Transparent 06/27/23 2000   Dressing Change Due 07/04/23 06/27/23 2000   Line Necessity Reviewed? Y 06/27/23 2000  Line Necessity Indications Yes - Hemodialysis 06/27/23 2000   Line Necessity Reviewed With Nephrology 06/27/23 2000        Arteriovenous Fistula - Vein Graft  Access 01/29/20  Left Forearm (Active)     Catheter fill volumes:    Arterial: 2 mL Venous: 2.1 mL   Catheter filled with  1 mg Gentamicin Citrate post procedure.     Patient Lines/Drains/Airways Status       Active Peripheral & Central Intravenous Access       Name Placement date Placement time Site Days    Peripheral IV 06/25/23 Anterior;Right Forearm 06/25/23  1326  Forearm  2                   LAB RESULTS:  Lab Results   Component Value Date    NA 143 06/27/2023    K 3.2 (L) 06/27/2023    CL 106 06/27/2023    CO2 24.0 06/27/2023    BUN 42 (H) 06/27/2023    CREATININE 13.56 (H) 06/27/2023    GLU 86 06/27/2023    CALCIUM 8.7 06/27/2023    CAION 3.91 (L) 06/23/2023    PHOS 7.2 (H) 06/27/2023    MG 1.9 06/27/2023    PTH 261.5 (H) 10/24/2020    IRON 67 06/23/2023    LABIRON 40 06/23/2023    TRANSFERRIN 125.0 (L) 09/25/2021    FERRITIN 425.7 (H) 06/22/2023    TIBC 169 (L) 06/23/2023     Lab Results   Component Value Date    WBC 14.7 (H) 06/27/2023    HGB 8.9 (L) 06/27/2023    HCT 27.1 (L) 06/27/2023    PLT 385 06/27/2023    PHART 7.44 08/05/2020 PO2ART 142.0 (H) 08/05/2020    PCO2ART 36.9 08/05/2020    HCO3ART 25 08/05/2020    BEART 0.9 08/05/2020    O2SATART 99.4 08/05/2020    APTT 30.8 03/23/2022        VITAL SIGNS:   Temperature        Date/Time Temp Temp src       06/27/23 2000 36.3 ??C (97.4 ??F)  Oral      06/27/23 1638 36.5 ??C (97.7 ??F)  Oral                    Hemodynamics        Date/Time Pulse BP MAP (mmHg) Patient Position    06/27/23 2000 102  134/56  79  Lying     06/27/23 1955 104  120/43  --  Lying     06/27/23 1930 87  100/49  --  Lying     06/27/23 1900 94  109/41  --  Lying     06/27/23 1830 81  94/45  --  Lying     06/27/23 1800 89  117/55  --  Lying     06/27/23 1730 89  118/57  --  Lying     06/27/23 1700 92  112/57  --  Lying     06/27/23 1650 90  113/54  --  Lying     06/27/23 1638 94  118/58  --  Lying                   Blood Volume Monitor        Date/Time Blood Volume Change (%) HCT HGB Critline O2 SAT %    06/27/23 1955 -9.4 %  27.4  9.3  56.3     06/27/23 1930 -8.9 %  27.3  9.3  59.4     06/27/23 1900 -5.6 %  26.3  8.9  8.9     06/27/23 1830 -3.5 %  35.8  8.8  56.7     06/27/23 1800 -5.6 %  26.3  8.9  61.5     06/27/23 1730 -5.3 %  26.8  8.9  59.7     06/27/23 1700 -3.7 %  25.8  8.8  61.7     06/27/23 1650 --  24.8  8.4  63.7                   Oxygen Therapy        Date/Time Resp SpO2 O2 Device FiO2 (%) O2 Flow Rate (L/min)    06/27/23 2000 17  100 %  None (Room air)  -- --     06/27/23 1955 17  100 %  None (Room air)  -- --     06/27/23 1930 17  --  None (Room air)  -- --     06/27/23 1900 17  --  None (Room air)  -- 2 L/min     06/27/23 1830 17  --  None (Room air)  -- --     06/27/23 1800 17  --  None (Room air)  -- --     06/27/23 1730 18  --  None (Room air)  -- --     06/27/23 1700 18  --  None (Room air)  -- --     06/27/23 1650 17  --  None (Room air)  -- --     06/27/23 1638 18  --  --  -- --                     Pre-Hemodialysis Assessment        Date/Time Therapy Number Dialyzer Hemodialysis Line Type All Machine Alarms Passed    06/27/23 1625 1  F-180 (98 mLs)  Adult (142 m/s)  Yes       Date/Time Air Detector Saline Line Double Clampled Hemo-Safe Applied Dialysis Flow (mL/min)    06/27/23 1625 Engaged  --  --  800 mL/min       Date/Time Verify Priming Solution Priming Volume Hemodialysis Independent pH Hemodialysis Machine Conductivity (mS/cm)    06/27/23 1625 0.9% NS  300 mL  --  13.7 mS/cm       Date/Time Hemodialysis Independent Conductivity (mS/cm) Bicarb Conductivity Residual Bleach Negative Total Chlorine    06/27/23 1625 13.7 mS/cm  -- Yes  0                   Pre-Hemodialysis Treatment Comments        Date/Time Pre-Hemodialysis Comments    06/27/23 1625 drowsy,came in a  stretcher .                   Hemodialysis Treatment        Date/Time Blood Flow Rate (mL/min) Arterial Pressure (mmHg) Venous Pressure (mmHg) Transmembrane Pressure (mmHg)    06/27/23 2000 0 mL/min  28 mmHg  7 mmHg  11 mmHg     06/27/23 1955 200 mL/min  -2 mmHg  64 mmHg  44 mmHg     06/27/23 1930 250 mL/min  -102 mmHg  82 mmHg  38 mmHg     06/27/23 1900 350 mL/min  -100 mmHg  80 mmHg  40 mmHg     06/27/23 1830 350 mL/min  -90 mmHg  8  mmHg  0 mmHg     06/27/23 1800 250 mL/min  -100 mmHg  90 mmHg  40 mmHg     06/27/23 1730 250 mL/min  -90 mmHg  110 mmHg  50 mmHg     06/27/23 1700 250 mL/min  -90 mmHg  70 mmHg  60 mmHg     06/27/23 1650 250 mL/min  -80 mmHg  70 mmHg  50 mmHg       Date/Time Ultrafiltration Rate (mL/hr) Ultrafiltrate Removed (mL) Dialysate Flow Rate (mL/min) KECN Linna Caprice)    06/27/23 2000 0 mL/hr  512 mL  0 ml/min  --     06/27/23 1955 0 mL/hr  512 mL  500 ml/min  187 Kecn     06/27/23 1930 180 mL/hr  441 mL  500 ml/min  187 Kecn     06/27/23 1900 50 mL/hr  412 mL  5000 ml/min  --     06/27/23 1830 50 mL/hr  333 mL  500 ml/min  --     06/27/23 1800 190 mL/hr  237 mL  500 ml/min  --     06/27/23 1730 180 mL/hr  126 mL  500 ml/min  --     06/27/23 1700 180 mL/hr  38 mL  500 ml/min  --     06/27/23 1650 180 mL/hr  0 mL  500 ml/min  -- Hemodialysis Treatment Comments        Date/Time Intra-Hemodialysis Comments    06/27/23 1955 care concluded     06/27/23 1930 bowels open     06/27/23 1900 stable     06/27/23 1830 stable     06/27/23 1800 stable     06/27/23 1730 stable     06/27/23 1700 stable     06/27/23 1650 Treatment started per  md order                   Post Treatment        Date/Time Rinseback Volume (mL) On Line Clearance: spKt/V Total Liters Processed (L/min) Dialyzer Clearance    06/27/23 2000 300 mL  --  42.6 L/min  Moderately streaked                   Post Hemodialysis Treatment Comments        Date/Time Post-Hemodialysis Comments    06/27/23 2000 last fill has been drain after dialysis treatment,                   Hemodialysis I/O        Date/Time Total Hemodialysis Replacement Volume (mL) Total Ultrafiltrate Output (mL)    06/27/23 2000 --  0 mL                     [id number] - Medicaitons Given During Treatment  (last 4 hrs)           Kysha Muralles B, RN         Medication Name Action Time Action Route Rate Dose User     gentamicin-sodium citrate lock solution in NS 06/27/23 2000 Given Intra-cannular  2 mL Tonnette Zwiebel B, RN     gentamicin-sodium citrate lock solution in NS 06/27/23 2000 Given Intra-cannular  2.1 mL Aleena Kirkeby B, RN                      Patient tolerated treatment in a  Doctor, general practice.

## 2023-06-28 NOTE — Unmapped (Signed)
3 hours tx with no UF.   Maintain HD schedules as ordered.

## 2023-06-28 NOTE — Unmapped (Signed)
Patient Name: Megan Robertson   Caller: [facility]  Name of Caller: Judeth Cornfield or Shirlean Schlein Method: Telephone Call: Time- Any Time [phone]  Type of Order: Home Health  Details about the Order Patient is being discharged today, 07/01/23, so [facility] just wanted to let Dr. Inda Castle know they will resume care on Monday, 07/01/23.  Last seen in department: 11/22/22

## 2023-06-28 NOTE — Unmapped (Signed)
[facility] Nephrology Hemodialysis Procedure Note     06/28/2023    Megan Robertson was seen and examined on hemodialysis    CHIEF COMPLAINT: End Stage Renal Disease    INTERVAL HISTORY: no complaints other than pain at catheter site which was placed yesterday. Tolerated treatment without issue yest    DIALYSIS TREATMENT DATA:  Estimated Dry Weight (kg):  (tbd) Patient Goal Weight (kg): 0.5 kg (1 lb 1.6 oz)   Pre-Treatment Weight (kg): 82.7 kg (182 lb 5.1 oz)    Dialysis Bath  Bath: 3 K+ / 2.5 Ca+  Dialysate Na (mEq/L): 137 mEq/L  Dialysate HCO3 (mEq/L): 35 mEq/L Dialyzer: F-180 (98 mLs)   Blood Flow Rate (mL/min): 350 mL/min Dialysis Flow (mL/min): 800 mL/min   Machine Temperature (C): 36.5 ??C (97.7 ??F)      PHYSICAL EXAM:  Vitals:  Temp:  [36.3 ??C (97.4 ??F)-36.8 ??C (98.2 ??F)] 36.7 ??C (98.1 ??F)  Heart Rate:  [81-108] 96  BP: (92-134)/(41-97) 114/58  MAP (mmHg):  [58-79] 72    General: in no acute distress, currently dialyzing in a Hemodialysis Recliner  Pulmonary: clear to auscultation  Cardiovascular: regular rate and rhythm  Extremities: no significant  edema  Access: Right IJ tunneled catheter     LAB DATA:  Lab Results   Component Value Date    NA 143 06/27/2023    K 3.2 (L) 06/27/2023    CL 106 06/27/2023    CO2 24.0 06/27/2023    BUN 42 (H) 06/27/2023    CREATININE 13.56 (H) 06/27/2023    CALCIUM 8.7 06/27/2023    MG 1.9 06/27/2023    PHOS 7.2 (H) 06/27/2023    ALBUMIN 2.8 (L) 06/27/2023      Lab Results   Component Value Date    HCT 27.1 (L) 06/27/2023    WBC 14.7 (H) 06/27/2023        ASSESSMENT/PLAN:  End Stage Renal Disease on Intermittent Hemodialysis:  UF goal: 0L as tolerated  Adjust medications for a GFR <10  Avoid nephrotoxic agents  Last HD Treatment:Completed (06/27/23)     Bone Mineral Metabolism:  Lab Results   Component Value Date    CALCIUM 8.7 06/27/2023    CALCIUM 8.4 (L) 06/26/2023    Lab Results   Component Value Date    ALBUMIN 2.8 (L) 06/27/2023    ALBUMIN 2.7 (L) 06/26/2023      Lab Results Component Value Date    PHOS 7.2 (H) 06/27/2023    PHOS 6.8 (H) 06/26/2023    Lab Results   Component Value Date    PTH 261.5 (H) 10/24/2020    PTH 173.3 (H) 08/01/2020      Continue phosphorus binder and dietary counseling.    Anemia:   Lab Results   Component Value Date    HGB 8.9 (L) 06/27/2023    HGB 8.6 (L) 06/26/2023    HGB 8.9 (L) 06/25/2023    Iron Saturation (%)   Date Value Ref Range Status   06/23/2023 40 20 - 55 % Final      Lab Results   Component Value Date    FERRITIN 425.7 (H) 06/22/2023       Received mircera as outpt recently  ; will trend hgb.    Vascular Access:  Vascular Access functioning well - no need for intervention  Blood Flow Rate (mL/min): 350 mL/min    IV Antibiotics to be administered at discharge:  No    This procedure was fully reviewed  with the patient and/or their decision-maker. The risks, benefits, and alternatives were discussed prior to the procedure. All questions were answered and written informed consent was obtained.    Simrah Chatham [name], MD  [place] Division of Nephrology & Hypertension

## 2023-06-28 NOTE — Unmapped (Signed)
[facility] Nephrology Peritoneal Dialysis Procedure Note     06/27/2023    Patient Megan Robertson was seen and examined on peritoneal dialysis    CHIEF COMPLAINT: End Stage Renal Disease    INTERVAL HISTORY: no major complaints; BUN stable despite PD again     PERITONEAL DIALYSIS PRESCRIPTION:  DIALYSATE FLUID:  Dianeal Solution: Dextrose 1.5% Calcium 3.5 mEq/L   ADDITIVES:  None    THERAPY DETAILS:  Peritoneal Dialysis Fill Volume (ml): 2500 ml Peritoneal Dialysis Total Volume (ml): 14000 ml   Average Dwell Time (Minutes): 147 Minutes Effluent Appearance: Clear     EXCHANGE NET BALANCE:    PD Net Exchange Output (mL): 497 ml     PHYSICAL EXAM:  Vitals:  Temp:  [36.4 ??C (97.5 ??F)-37 ??C (98.6 ??F)] 36.5 ??C (97.7 ??F)  Heart Rate:  [81-108] 94  BP: (88-128)/(41-97) 109/41  MAP (mmHg):  [64-75] 75  Weights:  Pre-Treatment Weight (kg):  (UTW,post VIR)    General: Appearing fatigued  Pulmonary: normal  Cardiovascular: regular rate and rhythm  Extremities: no significant  edema  Access: PD Catheter, no erythema, no purulence, or no tenderness    LAB DATA:  Lab Results   Component Value Date    NA 143 06/27/2023    K 3.2 (L) 06/27/2023    CL 106 06/27/2023    CO2 24.0 06/27/2023    BUN 42 (H) 06/27/2023    CREATININE 13.56 (H) 06/27/2023      Lab Results   Component Value Date    HCT 27.1 (L) 06/27/2023    WBC 14.7 (H) 06/27/2023        ASSESSMENT/PLAN:  ESRD on Peritoneal Dialysis:  Continue CCPD  UF  Plan to transition to IHD if able to have catheter placed today; will drain last fill if so.  Renally dose all medications     Bone Mineral Metabolism:  Lab Results   Component Value Date    CALCIUM 8.7 06/27/2023    CALCIUM 8.4 (L) 06/26/2023    Lab Results   Component Value Date    ALBUMIN 2.8 (L) 06/27/2023    ALBUMIN 2.7 (L) 06/26/2023      Lab Results   Component Value Date    PHOS 7.2 (H) 06/27/2023    PHOS 6.8 (H) 06/26/2023    Lab Results   Component Value Date    PTH 261.5 (H) 10/24/2020    PTH 173.3 (H) 08/01/2020 Continue phosphorus binder and dietary counseling.    Anemia:   Lab Results   Component Value Date    HGB 8.9 (L) 06/27/2023    HGB 8.6 (L) 06/26/2023    HGB 8.9 (L) 06/25/2023    Lab Results   Component Value Date    TRANSFERRIN 125.0 (L) 09/25/2021      Lab Results   Component Value Date    FERRITIN 425.7 (H) 06/22/2023    Lab Results   Component Value Date    LABIRON 40 06/23/2023      Anemia labs appropriate, no changes.    This procedure was fully reviewed with the patient and/or their decision-maker. The risks, benefits, and alternatives were discussed prior to the procedure. All questions were answered and written informed consent was obtained.    Aren Pryde [name], MD   Division of Nephrology & Hypertension

## 2023-06-28 NOTE — Unmapped (Deleted)
Physician Discharge Summary [facility]  [address]  [place] [address]  Dept: [phone]  Loc: [phone]     Identifying Information:   KIERSYN DRUKER  1971-04-29  [id number]    Primary Care Physician: Rudie Meyer, MD     Code Status: Full Code    Admit Date: 06/22/2023    Discharge Date: 06/28/2023     Discharge To: Home with Home Health and/or PT/OT    Discharge Service: [facility] - Nephrology Floor Team (MED B - Tower)     Discharge Attending Physician: Lyla Glassing, MD    Discharge Diagnoses:   Principal Problem:    Hypotension (POA: Yes)  Active Problems:    Hypertension (POA: Yes)    Leukocytosis (POA: Yes)    Anemia (POA: Yes)    Hypokalemia (POA: Yes)    ESRD (end stage renal disease) on dialysis (CMS-HCC) (POA: Not Applicable)    Hyperphosphatemia (POA: Yes)    Poor fluid intake (POA: Yes)    Hypovolemia (POA: Yes)    Dysgeusia (POA: Yes)    Fatigue (POA: Yes)    Dysuria (POA: Yes)    Physical deconditioning (POA: Yes)    Malnutrition (CMS-HCC) (POA: Yes)  Resolved Problems:    * No resolved hospital problems. [facility] Course:   Outpatient follow up:  [ ]  follow up with vascular surgery in outpatient setting for long term HD access planning.   [ ]  has an appointment for PD catheter removal .    Hypotension - Poor PO Intake, Altered Taste - Fatigue - Dysuria, Leukocytosis- all resolved  Patient presented for a second time in the past month with nonspecific symptoms of altered taste, poor p.o. intake, fatigue, and possibly dysuria.  It seems most likely that her symptoms were due to low-grade uremia from poor exchange across her peritoneum during PD.  Her symptoms resolved with initiation of midodrine for chronic hypotension, mirtazapine for appetite stimulation, and ultimately transition to intermittent hemodialysis.  Initial hospital course complicated by refeeding which had resolved for several days prior to discharge.  - Continue mirtazapine 50 mg nightly  - Status post empiric course of antibiotics for indeterminant urinalysis with lower urinary tract symptoms  - Continue midodrine 5 mg 3 times daily  - Daily multivitamin on discharge  - Transition to intermittent hemodialysis as below    ESRD on PD  During last admission, increased time of PD to 12-18 hours per pt due to concern for possible uremia contributing to her poor PO intake and lethargy.  Trialed several different peritoneal dialysis prescriptions and ultimately made the decision to transition to IHD.  Dialyzed and felt much better at time of discharge.  Tunneled line was placed prior to discharge.  Will need follow-up in outpatient setting for planning of definitive hemodialysis access.  - Outpatient follow-up with vascular surgery for fistula planning  - Continue IHD at previous dialysis center  - Continue sevelamer 2400 mg daily     Deconditioning -   Received PT OT while inpatient and will discharge home with home health.    Normocytic Anemia  Hgb 10.1, improved from prior but may reflect hemoconcentration iso hypovolemia.   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     The patient's hospital stay has been complicated  by the following clinically significant conditions requiring additional evaluation and treatment or having a significant effect of this patient's care: - Malnutrition POA requiring further investigation, treatment, or monitoring  - Anemia POA requiring further investigation or monitoring  - Chronic kidney disease POA requiring further investigation, treatment, or monitoring  - Age related debility POA requiring additional resources: DME, PT, or OT  - Hypokalemia POA requiring further investigation, treatment, or monitoring        Outpatient Provider Follow Up Issues:   Ensure she follows up with vascular surgery in the outpatient setting    Touchbase with Outpatient Provider:  Warm Handoff: Completed on 06/28/23 by Burgess Estelle, MD  (Resident) via Epic Secure Chat    Procedures:  Tunneled line placement  ______________________________________________________________________  Discharge Medications:      Your Medication List        STOP taking these medications      polyethylene glycol 17 gram packet  Commonly known as: MIRALAX            START taking these medications      mirtazapine 15 MG tablet  Commonly known as: REMERON  Take 1 tablet (15 mg total) by mouth nightly.     multivitamin, therapeutic with minerals 9 mg/15 mL iron Liqd  Commonly known as: CENTRUM  Take 15 mL by mouth daily for 16 days.            CHANGE how you take these medications      sevelamer 800 mg tablet  Commonly known as: RENVELA  Take 3 tablets (2,400 mg total) by mouth Three (3) times a day with a meal.  What changed:   how much to take  Another medication with the same name was removed. Continue taking this medication, and follow the directions you see here.            CONTINUE taking these medications      albuterol 90 mcg/actuation inhaler  Commonly known as: PROVENTIL HFA;VENTOLIN HFA  Inhale 2 puffs every six (6) hours as needed for wheezing.     apixaban 2.5 mg Tab  Commonly known as: ELIQUIS  Take 1 tablet (2.5 mg total) by mouth two (2) times a day.     aspirin 81 MG chewable tablet  Chew 1 tablet (81 mg total) daily.     AURYXIA ORAL  Take 1 g by mouth Three (3) times a day.     calcitriol 0.5 MCG capsule  Commonly known as: ROCALTROL  Take 1 capsule (0.5 mcg total) by mouth daily.     cinacalcet 60 MG tablet  Commonly known as: SENSIPAR  Take 1 tablet (60 mg total) by mouth daily.     COSENTYX PEN (2 PENS) 150 mg/mL Pnij injection  Generic drug: secukinumab  Inject the contents of 2 pens (300 mg total) under the skin every twenty-eight (28) days. Maintenance dose.     furosemide 40 MG tablet  Commonly known as: LASIX  Take 1 tablet up to 3 times weekly as needed for leg swelling. midodrine 5 MG tablet  Commonly known as: PROAMATINE  Take 1 tablet (5 mg total) by mouth two (2) times a day as needed. (Take 1 tab after PD. Please check your BP each afternoon, take 1 tab for systolic pressure less than 90)     nystatin 100,000 unit/gram powder  Commonly known as: MYCOSTATIN  Apply under breasts two times daily until resolution     QVAR REDIHALER 80 mcg/actuation  inhaler  Generic drug: beclomethasone dipropionate  Inhale 1 puff  in the morning and 1 puff in the evening.     senna 8.6 mg tablet  Commonly known as: SENOKOT  Take 2 tablets by mouth daily.     senna-docusate 8.6-50 mg  Commonly known as: PERICOLACE  Take 2 tablets by mouth daily.     tizanidine 2 MG tablet  Commonly known as: ZANAFLEX  Take 1 tablet (2 mg total) by mouth daily as needed.     triamcinolone 0.1 % ointment  Commonly known as: KENALOG  Apply topically two (2) times a day as needed. Use on psoriasis until flat.              Allergies:  Cephalexin, Mango, Peach (prunus persica), Wellbutrin [bupropion hcl], Egg, Fish containing products, Lactase-rennet, Lexiscan [regadenoson], and Mushroom  ______________________________________________________________________  Pending Test Results:      Most Recent Labs:  All lab results last 24 hours -   Recent Results (from the past 24 hour(s))   Magnesium Level    Collection Time: 06/28/23  8:43 AM   Result Value Ref Range    Magnesium 1.7 1.6 - 2.6 mg/dL   Renal Function Panel    Collection Time: 06/28/23  8:43 AM   Result Value Ref Range    Sodium 147 (H) 135 - 145 mmol/L    Potassium 3.7 3.4 - 4.8 mmol/L    Chloride 104 98 - 107 mmol/L    CO2 28.0 20.0 - 31.0 mmol/L    Anion Gap 15 (H) 5 - 14 mmol/L    BUN 29 (H) 9 - 23 mg/dL    Creatinine 1.61 (H) 0.55 - 1.02 mg/dL    BUN/Creatinine Ratio 3     eGFR CKD-EPI (2021) Female 5 (L) >=60 mL/min/1.13m2    Glucose 96 70 - 179 mg/dL    Calcium 8.2 (L) 8.7 - 10.4 mg/dL    Phosphorus 5.9 (H) 2.4 - 5.1 mg/dL    Albumin 2.3 (L) 3.4 - 5.0 g/dL CBC w/ Differential    Collection Time: 06/28/23  8:43 AM   Result Value Ref Range    WBC 13.2 (H) 3.6 - 11.2 10*9/L    RBC 2.61 (L) 3.95 - 5.13 10*12/L    HGB 7.8 (L) 11.3 - 14.9 g/dL    HCT 09.6 (L) 04.5 - 44.0 %    MCV 92.0 77.6 - 95.7 fL    MCH 30.0 25.9 - 32.4 pg    MCHC 32.6 32.0 - 36.0 g/dL    RDW 40.9 81.1 - 91.4 %    MPV 6.8 6.8 - 10.7 fL    Platelet 269 150 - 450 10*9/L    Neutrophils % 69.8 %    Lymphocytes % 15.7 %    Monocytes % 8.8 %    Eosinophils % 5.5 %    Basophils % 0.2 %    Absolute Neutrophils 9.2 (H) 1.8 - 7.8 10*9/L    Absolute Lymphocytes 2.1 1.1 - 3.6 10*9/L    Absolute Monocytes 1.2 (H) 0.3 - 0.8 10*9/L    Absolute Eosinophils 0.7 (H) 0.0 - 0.5 10*9/L    Absolute Basophils 0.0 0.0 - 0.1 10*9/L   Type and Screen    Collection Time: 06/28/23  9:45 AM   Result Value Ref Range    ABO Grouping O NEG     Antibody Screen NEG    Hemoglobin and Hematocrit    Collection Time: 06/28/23 11:43 AM   Result Value  Ref Range    HGB 8.8 (L) 11.3 - 14.9 g/dL    HCT 56.4 (L) 33.2 - 44.0 %       Relevant Studies/Radiology:  IR Insert Tunneled Catheter (Age Greater Than 5 Years)    Result Date: 06/27/2023  PROCEDURE: Tunneled central venous catheter placement ACCESSION: [id number] UN Procedural Personnel Attending physician(s): Cathie Hoops Resident physician(s): Lequita Halt Advanced practice provider(s): None Procedure Date (mm/dd/yyyy): 06/27/2023 4:32 PM Pre-procedure diagnosis: Renal dysfunction Post-procedure diagnosis: Same Indication: Other-hemodialysis Additional clinical history: None _______________________________________________________________ PROCEDURE SUMMARY: - Venous access with ultrasound guidance - Tunneled central venous catheter insertion with fluoroscopic guidance - Additional procedure(s): None PROCEDURE DETAILS: Pre-procedure Consent: Informed consent for the procedure including risks, benefits and alternatives was obtained and time-out was performed prior to the procedure. Preparation: The site was prepared and draped using maximal sterile barrier technique including cutaneous antisepsis. Anesthesia/sedation Level of anesthesia/sedation: Moderate sedation (conscious sedation) Anesthesia/sedation administered by: Independent trained observer under attending supervision with continuous monitoring of the patient's level of consciousness and physiologic status Total intra-service sedation time (minutes): I personally spent 77 minutes, continuously monitoring the patient face-to-face during the administration of moderate sedation. Radiology nurse was present for the duration of the procedure to assist in patient monitoring. Pre and Post Sedation activities have been reviewed. Access Local anesthesia was administered. The vessel was sonographically evaluated and determined to be patent. Real time ultrasound was used to visualize needle entry into the vessel and a permanent image was stored. Access was lost after initial advancement of the wire, so the wire and access needle were removed and pressure was held for approximately 5 minutes. Hemostasis was achieved. Real-time ultrasound was again used to visualize needle entry into the vessel and another permanent image was stored. Laterality: Right Vein accessed: Internal jugular vein Access technique: Micropuncture set with 21 gauge needle Venography Indication for venography: Not applicable Vein catheterized: Not applicable Findings: Not applicable Catheter placement An incision was made near the venous access site and the catheter was tunneled subcutaneously to the venous access site. The catheter was advanced via a peel-away sheath into the vein under fluoroscopic guidance. Catheter tip location was fluoroscopically verified and a permanent image was stored. Catheter placed: 23 cm hemodialysis Lot number: na Catheter size Janice Norrie): 14.5 Lumens: 2 Power injectable: Yes Catheter tip: cavoatrial junction Catheter flush: Heparin (1000 units/mL) Closure A sterile dressing was applied. Access site closure technique: Tissue adhesive Catheter securement technique: Non-absorbable suture Contrast Contrast agent: None Contrast volume (mL): N/A Radiation Dose Fluoroscopy time (minutes): 5.2 Reference Air Kerma (mGy): 40.2 Additional Details Additional description of procedure: None Registry event: V/3/g Device used: None Equipment details: None Unique Device Identifiers: Not available Specimens removed: None Estimated blood loss (mL): Less than 10 Standardized report: [email or web address]_v3.1 Attestation Signer name: Ammie Dalton I attest that I was present for the entire procedure. I reviewed the stored images and agree with the report as written. _______________________________________________________________ Complications: No immediate complications.     Insertion of right-sided tunneled Dual-lumen Hemodialysis central venous catheter in the internal jugular vein, with tip in the expected location of the cavoatrial junction. Plan: The catheter may be used immediately.     ECG 12 Lead    Result Date: 06/24/2023  POOR DATA QUALITY, INTERPRETATION MAY BE ADVERSELY AFFECTED NORMAL SINUS RHYTHM PROLONGED QT ABNORMAL ECG WHEN COMPARED WITH ECG OF AUG 2024 NO SIGNIFICANT CHANGE WAS FOUND Confirmed by Christella Noa ([id number]) on 06/24/2023 8:43:09 AM   ______________________________________________________________________  Discharge Instructions:  Follow Up instructions and Outpatient Referrals     Ambulatory Referral to Home Health      Reason for referral: Decreased mobility, Fall risk, Impaired ADLs,   Impaired sensation, Decreased endurance, Impaired balance, Decreased   strength    Physician to follow patient's care: PCP    Disciplines requested:  Nursing  Physical Therapy  Occupational Therapy       Nursing requested: Other: (please enter in comments) Comment - MEDICATION   MANAGEMENT AND EDUCATION    Physical Therapy requested: Evaluate and treat    Occupational Therapy Requested: Evaluate and treat    Requested SOC Date:  Comment - WITHIN 48 HOURS    Ambulatory Referral to Vascular Surgery      Reason for referral: fistula placement/planning.    Requested follow up plan: You would evaluate and manage.        Appointments which have been scheduled for you      Jul 10, 2023 2:30 PM  (Arrive by 2:15 PM)  ECHOCARDIOGRAM W COLORFLOW SPECTRAL DOPPLER with ET FL 2 ECHO RM 1  IMG ECHO [place] [facility] [facility] [place] [address]  [facility] 1 through 4  [place] [address]  [phone]        Jul 23, 2023 2:30 PM  (Arrive by 2:00 PM)  RETURN VIDEO [place] with Roe Coombs, PhD  [facility] PAIN MANAGEMENT CENTER [address] [place] [facility] REGION) [address]  [address]  [place] [address]  [phone]   Please sign into My [facility] Chart at least 15 minutes before your appointment to complete the eCheck-In process. You must complete eCheck-In before you can start your video visit. We also recommend testing your audio and video connection to troubleshoot any issues before your visit begins. Click ???Join Video Visit??? to complete these checks. Once you have completed eCheck-In and tested your audio and video, click ???Join Call??? to connect to your visit.     For your video visit, you will need a computer with a working camera, speaker and microphone, a smartphone, or a tablet with internet access.    My [facility] Chart enables you to manage your health, send non-urgent messages to your provider, view your test results, schedule and manage appointments, and request prescription refills securely and conveniently from your computer or mobile device.    You can go to [email or web address] to sign in to your My [facility] Chart account with your username and password. If you have forgotten your username or password, please choose the ???Forgot Username???? and/or ???Forgot Password???? links to gain access. You also can access your My [facility] Chart account with the free MyChart mobile app for Android or iPhone.    If you need assistance accessing your My  Chart account or for assistance in reaching your provider's office to reschedule or cancel your appointment, please call [facility] [phone].         Nov 20, 2023 11:00 AM  (Arrive by 10:30 AM)  PVL ARTERIAL DUPLEX UPPER EXTREMITY LEFT with [facility] PVL OUTPATIENT 1  IMG PVL [facility]) [address]  [place] [address]  [phone]        Nov 20, 2023 12:15 PM  (Arrive by 11:45 AM)  RETURN  GENERAL with Earney Mallet, MD  [facility] VASCULAR SURGERY [place] [facility] REGION) [address]  [place] [address]  [phone]  ______________________________________________________________________  Discharge Day Services:  BP 82/68  - Pulse 107  - Temp 36.6 ??C (97.9 ??F) (Oral)  - Resp 18  - Ht 157.2 cm (5' 1.89)  - Wt 81.1 kg (178 lb 10.9 oz)  - LMP  (LMP Unknown)  - SpO2 100%  - BMI 32.80 kg/m??     Pt seen on the day of discharge and determined appropriate for discharge.    Condition at Discharge: stable    Length of Discharge: I spent greater than 30 mins in the discharge of this patient.

## 2023-06-28 NOTE — Unmapped (Signed)
Closely monitor  vital  signs  with HD  WOF HD complications and keep vital signs  within set parameters  Plan to do 3hrs with 0 UF      Problem: Hemodialysis  Goal: Safe, Effective Therapy Delivery  Outcome: Progressing     Problem: Hemodialysis  Goal: Effective Tissue Perfusion  Outcome: Progressing     Problem: Hemodialysis  Goal: Absence of Infection Signs and Symptoms  Outcome: Progressing

## 2023-06-28 NOTE — Unmapped (Signed)
[facility] Nephrology Hemodialysis Procedure Note     06/27/2023    Megan Robertson was seen and examined on hemodialysis    CHIEF COMPLAINT: End Stage Renal Disease    INTERVAL HISTORY: no complaints; tolerated catheter placement without issue.    DIALYSIS TREATMENT DATA:  Estimated Dry Weight (kg):  (TBD) Patient Goal Weight (kg): 0 kg (0 lb)   Pre-Treatment Weight (kg):  (UTW,post VIR)    Dialysis Bath  Bath: 3 K+ / 2.5 Ca+  Dialysate Na (mEq/L): 137 mEq/L  Dialysate HCO3 (mEq/L): 35 mEq/L Dialyzer: F-180 (98 mLs)   Blood Flow Rate (mL/min): 350 mL/min Dialysis Flow (mL/min): 800 mL/min   Machine Temperature (C): 36.5 ??C (97.7 ??F)      PHYSICAL EXAM:  Vitals:  Temp:  [36.4 ??C (97.5 ??F)-37 ??C (98.6 ??F)] 36.5 ??C (97.7 ??F)  Heart Rate:  [81-108] 94  BP: (88-128)/(41-97) 109/41  MAP (mmHg):  [64-75] 75    General: in no acute distress, currently dialyzing in a Bed  Pulmonary: normal respiratory effort  Cardiovascular: regular rate and rhythm  Extremities: no significant  edema  Access: Right IJ tunneled catheter     LAB DATA:  Lab Results   Component Value Date    NA 143 06/27/2023    K 3.2 (L) 06/27/2023    CL 106 06/27/2023    CO2 24.0 06/27/2023    BUN 42 (H) 06/27/2023    CREATININE 13.56 (H) 06/27/2023    CALCIUM 8.7 06/27/2023    MG 1.9 06/27/2023    PHOS 7.2 (H) 06/27/2023    ALBUMIN 2.8 (L) 06/27/2023      Lab Results   Component Value Date    HCT 27.1 (L) 06/27/2023    WBC 14.7 (H) 06/27/2023        ASSESSMENT/PLAN:  End Stage Renal Disease on Intermittent Hemodialysis:  UF goal: 0L as tolerated  3hr tx today  Adjust medications for a GFR <10  Avoid nephrotoxic agents  Plan for additional tx and discharge after.  Last HD Treatment:Started (06/27/23)     Bone Mineral Metabolism:  Lab Results   Component Value Date    CALCIUM 8.7 06/27/2023    CALCIUM 8.4 (L) 06/26/2023    Lab Results   Component Value Date    ALBUMIN 2.8 (L) 06/27/2023    ALBUMIN 2.7 (L) 06/26/2023      Lab Results   Component Value Date    PHOS 7.2 (H) 06/27/2023    PHOS 6.8 (H) 06/26/2023    Lab Results   Component Value Date    PTH 261.5 (H) 10/24/2020    PTH 173.3 (H) 08/01/2020      Continue phosphorus binder and dietary counseling.    Anemia:   Lab Results   Component Value Date    HGB 8.9 (L) 06/27/2023    HGB 8.6 (L) 06/26/2023    HGB 8.9 (L) 06/25/2023    Iron Saturation (%)   Date Value Ref Range Status   06/23/2023 40 20 - 55 % Final      Lab Results   Component Value Date    FERRITIN 425.7 (H) 06/22/2023       Recent mircera as outpt ; hold for now.    Vascular Access:  Vascular Access functioning well - no need for intervention  Blood Flow Rate (mL/min): 350 mL/min    IV Antibiotics to be administered at discharge:  No    This procedure was fully reviewed with the patient and/or  their decision-maker. The risks, benefits, and alternatives were discussed prior to the procedure. All questions were answered and written informed consent was obtained.    Ramone Gander [name], MD  [place] Division of Nephrology & Hypertension

## 2023-06-28 NOTE — Unmapped (Signed)
HEMODIALYSIS NURSE PROCEDURE NOTE    Treatment Number:  2 Room/Station:  9 Procedure Date:  06/28/23   Total Treatment Time:  185 Min.    CONSENT:  Written consent was obtained prior to the procedure and is detailed in the medical record. Prior to the start of the procedure, a time out was taken and the identity of the patient was confirmed via name, medical record number and date of birth.     WEIGHTS:  Hemodialysis Pre-Treatment Weights        Date/Time Pre-Treatment Weight (kg) Estimated Dry Weight (kg) Patient Goal Weight (kg) Total Goal Weight (kg)    06/28/23 0820 82.7 kg (182 lb 5.1 oz)  --  tbd  0.5 kg (1 lb 1.6 oz)  1.05 kg (2 lb 5 oz)     06/27/23 1625 --  UTW,post VIR  --  TBD  0 kg (0 lb)  0.5 kg (1 lb 1.6 oz)                      Hemodialysis Post Treatment Weights        Date/Time Post-Treatment Weight (kg) Treatment Weight Change (kg)    06/28/23 1145 82.7 kg (182 lb 5.1 oz)  0 kg     06/27/23 2000 --  UTW due to weakness  --                   Active Dialysis Orders (168h ago, onward)       Start     Ordered    06/28/23 0726  Hemodialysis inpatient  Daily      Question Answer Comment   Patient HD Status: Chronic    New Start? Yes Prev PD   K+ 3 meq/L    Ca++ 2.5 meq/L    Bicarb 35 meq/L    Na+ 137 meq/L    Na+ Modeling No    Dialyzer [id number]    Dialysate Temperature (C) 36    BFR-As tolerated to a maximum of: Other (please specify) 350   DFR 800 mL/min    Duration of treatment 3 Hr    Dry weight (kg) TBD    Challenge dry weight (kg) No    Fluid removal (L) 0L    Tubing Adult = 142 ml    Access Site Dialysis Catheter    Access Site Location Right    Keep SBP >: 90        06/28/23 0725                  ACCESS SITE:       Hemodialysis Catheter 06/27/23 Venovenous catheter Right Internal jugular 2 mL 2.1 mL (Active)   Site Assessment Clean;Intact 06/28/23 1145   Proximal Lumen Status / Patency Capped 06/28/23 1145   Proximal Lumen Intervention Deaccessed 06/28/23 1145   Medial Lumen Status / Patency Capped 06/28/23 1145   Medial Lumen Intervention Deaccessed 06/28/23 1145   Dressing Status      Intact/not removed 06/28/23 1145   Verification by X-ray Yes 06/28/23 1145   Site Condition No complications 06/28/23 1145   Dressing Type CHG gel;Occlusive 06/28/23 1145   Dressing Change Due 07/04/23 06/28/23 1145   Line Necessity Reviewed? Y 06/28/23 1145   Line Necessity Indications Yes - Hemodialysis 06/28/23 1145   Line Necessity Reviewed With medB 06/27/23 2350        Arteriovenous Fistula - Vein Graft  Access 01/29/20  Left Forearm (Active)  Catheter Fill Volumes:  Arterial:  2 mL Venous:  2.1 mL   Catheter filled with  1 mg Gentamicin Citrate post procedure.    Patient Lines/Drains/Airways Status       Active Peripheral & Central Intravenous Access       Name Placement date Placement time Site Days    Peripheral IV 06/25/23 Anterior;Right Forearm 06/25/23  1326  Forearm  2                  LAB RESULTS:  Lab Results   Component Value Date    NA 147 (H) 06/28/2023    K 3.7 06/28/2023    CL 104 06/28/2023    CO2 28.0 06/28/2023    BUN 29 (H) 06/28/2023    CREATININE 9.33 (H) 06/28/2023    GLU 96 06/28/2023    CALCIUM 8.2 (L) 06/28/2023    CAION 3.91 (L) 06/23/2023    PHOS 5.9 (H) 06/28/2023    MG 1.7 06/28/2023    PTH 261.5 (H) 10/24/2020    IRON 67 06/23/2023    LABIRON 40 06/23/2023    TRANSFERRIN 125.0 (L) 09/25/2021    FERRITIN 425.7 (H) 06/22/2023    TIBC 169 (L) 06/23/2023     Lab Results   Component Value Date    WBC 13.2 (H) 06/28/2023    HGB 7.8 (L) 06/28/2023    HCT 24.0 (L) 06/28/2023    PLT 269 06/28/2023    PHART 7.44 08/05/2020    PO2ART 142.0 (H) 08/05/2020    PCO2ART 36.9 08/05/2020    HCO3ART 25 08/05/2020    BEART 0.9 08/05/2020    O2SATART 99.4 08/05/2020    APTT 30.8 03/23/2022        VITAL SIGNS:  Temperature        Date/Time Temp Temp src       06/28/23 1145 36.3 ??C (97.3 ??F)  Oral      06/28/23 0830 36.5 ??C (97.7 ??F)  Oral                    Hemodynamics        Date/Time Pulse BP MAP (mmHg) Patient Position    06/28/23 1145 105  120/55  --  Lying     06/28/23 1130 99  129/62  --  Lying     06/28/23 1100 93  114/57  --  Lying     06/28/23 1030 92  131/68  --  Lying     06/28/23 1000 90  119/63  --  Lying     06/28/23 0930 98  114/57  --  Lying     06/28/23 0900 87  105/58  --  Lying     06/28/23 0840 88  118/60  --  Lying     06/28/23 0830 93  116/56  --  Lying                   Blood Volume Monitor        Date/Time Blood Volume Change (%) HCT HGB Critline O2 SAT %    06/28/23 1145 -11.3 %  27.3  9.3  68.2     06/28/23 1130 -10.2 %  27  9.2  66     06/28/23 1100 -9.9 %  26.9  9.1  73.1     06/28/23 1030 -10.7 %  27.1  9.2  69.8     06/28/23 1000 -9 %  26.6  9  71.4  06/28/23 0930 -7.9 %  26.3  8.9  70.3     06/28/23 0900 -5.7 %  25.7  8.7  73     06/28/23 0840 --  --  --  --                   Oxygen Therapy        Date/Time Resp SpO2 O2 Device FiO2 (%) O2 Flow Rate (L/min)    06/28/23 1145 16  --  --  -- --     06/28/23 1130 16  --  None (Room air)  -- --     06/28/23 1100 16  --  --  -- --     06/28/23 1030 16  --  None (Room air)  -- --     06/28/23 1000 16  --  --  -- --     06/28/23 0930 16  --  --  -- --     06/28/23 0900 16  --  --  -- --     06/28/23 0840 16  --  None (Room air)  -- --     06/28/23 0830 16  --  --  -- --                   Oxygen Connected to Wall:  no    Pre-Hemodialysis Assessment        Date/Time Therapy Number Dialyzer All Research scientist (physical sciences) Detector Dialysis Flow (mL/min)    06/28/23 0820 2  F-180 (98 mLs)  Yes  Engaged  800 mL/min     06/27/23 1625 1  F-180 (98 mLs)  Yes  Engaged  800 mL/min       Date/Time Verify Priming Solution Priming Volume Hemodialysis Independent pH Hemodialysis Machine Conductivity (mS/cm) Hemodialysis Independent Conductivity (mS/cm)    06/28/23 0820 0.9% NS  300 mL  --  13.7 mS/cm  13.6 mS/cm     06/27/23 1625 0.9% NS  300 mL  --  13.7 mS/cm  13.7 mS/cm       Date/Time Bicarb Conductivity Residual Bleach Negative Free Chlorine Total Chlorine Chloramine    06/28/23 0820 -- Yes  -- 0  --    06/27/23 1625 -- Yes  -- 0  --                  Pre-Hemodialysis Treatment Comments        Date/Time Pre-Hemodialysis Comments    06/28/23 0820 calm ,alert     06/27/23 1625 drowsy,came in a  stretcher .                   Hemodialysis Treatment        Date/Time Blood Flow Rate (mL/min) Arterial Pressure (mmHg) Venous Pressure (mmHg) Transmembrane Pressure (mmHg)    06/28/23 1145 300 mL/min  -44 mmHg  92 mmHg  28 mmHg     06/28/23 1130 400 mL/min  -166 mmHg  123 mmHg  32 mmHg     06/28/23 1100 400 mL/min  -173 mmHg  120 mmHg  30 mmHg     06/28/23 1030 400 mL/min  -173 mmHg  126 mmHg  30 mmHg     06/28/23 1000 400 mL/min  -173 mmHg  124 mmHg  25 mmHg     06/28/23 0930 400 mL/min  -170 mmHg  122 mmHg  27 mmHg     06/28/23 0900 400 mL/min  -175 mmHg  122 mmHg  32 mmHg  06/28/23 0840 400 mL/min  -84 mmHg  26 mmHg  30 mmHg     06/28/23 0830 --  --  --  --       Date/Time Ultrafiltration Rate (mL/hr) Ultrafiltrate Removed (mL) Dialysate Flow Rate (mL/min) KECN Linna Caprice)    06/28/23 1145 0 mL/hr  550 mL  800 ml/min  --     06/28/23 1130 190 mL/hr  504 mL  800 ml/min  --     06/28/23 1100 190 mL/hr  410 mL  800 ml/min  --     06/28/23 1030 180 mL/hr  321 mL  800 ml/min  --     06/28/23 1000 180 mL/hr  232 mL  800 ml/min  --     06/28/23 0930 180 mL/hr  143 mL  800 ml/min  --     06/28/23 0900 180 mL/hr  54 mL  800 ml/min  --     06/28/23 0840 0 mL/hr  100 mL  800 ml/min  --     06/28/23 0830 --  --  --  --                   Hemodialysis Treatment Comments        Date/Time Intra-Hemodialysis Comments    06/28/23 1145 ended tx     06/28/23 1130 alert     06/28/23 1100 vss     06/28/23 1030 EYES CLOSED     06/28/23 1000 Stable     06/28/23 0930 eyes closed     06/28/23 0900 vss;sleeping     06/28/23 0845 seen by Dr. Tammi Klippel     06/28/23 0840 tx initiated                   Post Treatment        Date/Time Rinseback Volume (mL) On Line Clearance: spKt/V Total Liters Processed (L/min) Dialyzer Clearance    06/28/23 1145 300 mL  --  53.9 L/min  Lightly streaked     06/27/23 2000 300 mL  --  42.6 L/min  Moderately streaked                     Post Hemodialysis Treatment Comments        Date/Time Post-Hemodialysis Comments    06/28/23 1145 vss                   POST TREATMENT ASSESSMENT:  General appearance:  alert  Neurological:  Grossly normal  Lungs:  clear to auscultation bilaterally  Hearts:  S1, S2 normal  Abdomen:  soft, non-tender; bowel sounds normal; no masses,  no organomegaly        Hemodialysis I/O        Date/Time Total Hemodialysis Replacement Volume (mL) Total Ultrafiltrate Output (mL)    06/28/23 1145 --  0 mL                   [id number] - Medicaitons Given During Treatment  (last 4 hrs)           Ballard Budney, Era Bumpers, RN         Medication Name Action Time Action Route Rate Dose User     gentamicin-sodium citrate lock solution in NS 06/28/23 1141 Given Intra-cannular  2 mL Devanee Pomplun K, RN     gentamicin-sodium citrate lock solution in NS 06/28/23 1141 Given Intra-cannular  2.1 mL Kinzy Weyers, Era Bumpers, RN  Patient tolerated treatment in a  Dialysis Recliner.

## 2023-06-29 MED ADMIN — apixaban (ELIQUIS) tablet 2.5 mg: 2.5 mg | ORAL | @ 13:00:00 | Stop: 2023-06-29

## 2023-06-29 MED ADMIN — midodrine (PROAMATINE) tablet 5 mg: 5 mg | ORAL | @ 09:00:00 | Stop: 2023-06-29

## 2023-06-29 MED ADMIN — hydrocortisone 2.5 % cream: TOPICAL | @ 13:00:00 | Stop: 2023-06-29

## 2023-06-29 MED ADMIN — midodrine (PROAMATINE) tablet 5 mg: 5 mg | ORAL | @ 01:00:00

## 2023-06-29 MED ADMIN — fluticasone furoate (ARNUITY ELLIPTA) 100 mcg/actuation inhaler 1 puff: 1 | RESPIRATORY_TRACT | @ 13:00:00 | Stop: 2023-06-29

## 2023-06-29 MED ADMIN — multivitamin, therapeutic with minerals oral liquid: 15 mL | ORAL | @ 13:00:00 | Stop: 2023-06-29

## 2023-06-29 MED ADMIN — oxyCODONE (ROXICODONE) immediate release tablet 10 mg: 10 mg | ORAL | @ 01:00:00 | Stop: 2023-07-11

## 2023-06-29 MED ADMIN — calcitriol (ROCALTROL) capsule 0.5 mcg: .5 ug | ORAL | @ 13:00:00 | Stop: 2023-06-29

## 2023-06-29 MED ADMIN — white petrolatum-mineral oil-lanolin topical cream: TOPICAL | @ 01:00:00

## 2023-06-29 MED ADMIN — white petrolatum-mineral oil-lanolin topical cream: TOPICAL | @ 13:00:00 | Stop: 2023-06-29

## 2023-06-29 MED ADMIN — aspirin chewable tablet 81 mg: 81 mg | ORAL | @ 13:00:00 | Stop: 2023-06-29

## 2023-06-29 MED ADMIN — apixaban (ELIQUIS) tablet 2.5 mg: 2.5 mg | ORAL | @ 01:00:00

## 2023-06-29 MED ADMIN — mirtazapine (REMERON) tablet 15 mg: 15 mg | ORAL | @ 01:00:00

## 2023-06-29 MED ADMIN — sevelamer (RENVELA) tablet 2,400 mg: 2400 mg | ORAL | @ 13:00:00 | Stop: 2023-06-29

## 2023-06-29 NOTE — Unmapped (Signed)
Pt sleeping at this time.  No fall or injury this shift.  Pt stating D/C ing in the AM.  Took evening medication without difficulty.  Bed at lowest level and locked, call bell within reach.  Will continue to monitor  Problem: Adult Inpatient Plan of Care  Goal: Plan of Care Review  06/29/2023 0016 by Erlene Senters, RN  Outcome: Progressing  06/29/2023 0015 by Erlene Senters, RN  Reactivated  Goal: Patient-Specific Goal (Individualized)  06/29/2023 0016 by Erlene Senters, RN  Outcome: Progressing  06/29/2023 0015 by Erlene Senters, RN  Reactivated  Goal: Absence of Hospital-Acquired Illness or Injury  06/29/2023 0016 by Erlene Senters, RN  Outcome: Progressing  06/29/2023 0015 by Erlene Senters, RN  Reactivated  Intervention: Identify and Manage Fall Risk  Recent Flowsheet Documentation  Taken 06/28/2023 2104 by Erlene Senters, RN  Safety Interventions:   fall reduction program maintained   low bed   nonskid shoes/slippers when out of bed  Intervention: Prevent Skin Injury  Recent Flowsheet Documentation  Taken 06/28/2023 2104 by Erlene Senters, RN  Device Skin Pressure Protection:   absorbent pad utilized/changed   adhesive use limited  Skin Protection:   adhesive use limited   incontinence pads utilized  Goal: Optimal Comfort and Wellbeing  06/29/2023 0016 by Erlene Senters, RN  Outcome: Progressing  06/29/2023 0015 by Erlene Senters, RN  Reactivated  Goal: Readiness for Transition of Care  06/29/2023 0016 by Erlene Senters, RN  Outcome: Progressing  06/29/2023 0015 by Erlene Senters, RN  Reactivated  Goal: Rounds/Family Conference  06/29/2023 0016 by Erlene Senters, RN  Outcome: Progressing  06/29/2023 0015 by Erlene Senters, RN  Reactivated     Problem: Fall Injury Risk  Goal: Absence of Fall and Fall-Related Injury  06/29/2023 0016 by Erlene Senters, RN  Outcome: Progressing  06/29/2023 0015 by Erlene Senters, RN  Reactivated  Intervention: Promote Injury-Free Environment  Recent Flowsheet Documentation  Taken 06/28/2023 2104 by Erlene Senters, RN  Safety Interventions:   fall reduction program maintained   low bed   nonskid shoes/slippers when out of bed     Problem: Peritoneal Dialysis  Goal: Absence of Infection Signs and Symptoms  06/29/2023 0016 by Erlene Senters, RN  Outcome: Progressing  06/29/2023 0015 by Erlene Senters, RN  Reactivated     Problem: Wound  Goal: Optimal Functional Ability  Intervention: Optimize Functional Ability  Recent Flowsheet Documentation  Taken 06/28/2023 2104 by Erlene Senters, RN  Activity Management:   ambulated to bathroom   back to bed  Goal: Skin Health and Integrity  Intervention: Optimize Skin Protection  Recent Flowsheet Documentation  Taken 06/28/2023 2104 by Erlene Senters, RN  Activity Management:   ambulated to bathroom   back to bed  Pressure Reduction Techniques:   frequent weight shift encouraged   weight shift assistance provided  Head of Bed (HOB) Positioning:   HOB at 20-30 degrees   HOB at 30-45 degrees  Pressure Reduction Devices: pressure-redistributing mattress utilized  Skin Protection:   adhesive use limited   incontinence pads utilized     Problem: Hemodialysis  Goal: Safe, Effective Therapy Delivery  06/29/2023 0016 by Erlene Senters, RN  Outcome: Progressing  06/29/2023 0015 by Erlene Senters, RN  Reactivated  Goal: Effective Tissue Perfusion  06/29/2023 0016 by Erlene Senters, RN  Outcome: Progressing  06/29/2023 0015 by Erlene Senters, RN  Reactivated  Goal: Absence of Infection Signs and Symptoms  06/29/2023 0016 by Erlene Senters, RN  Outcome: Progressing  06/29/2023 0015 by Erlene Senters, RN  Reactivated

## 2023-06-29 NOTE — Unmapped (Signed)
Physician Discharge Summary [facility]  3 [facility]  [address]  [place] [address]  Dept: [phone]  Loc: [phone]     Identifying Information:   Megan Robertson  1971/06/09  [id number]    Primary Care Physician: Rudie Meyer, MD     Code Status: Full Code    Admit Date: 06/22/2023    Discharge Date: 06/29/2023     Discharge To: Home with Home Health and/or PT/OT    Discharge Service: [facility] - Nephrology Floor Team (MED B - Tower)     Discharge Attending Physician: Rexene Agent, MD    Discharge Diagnoses:   Principal Problem:    Hypotension (POA: Yes)  Active Problems:    Hypertension (POA: Yes)    Leukocytosis (POA: Yes)    Anemia (POA: Yes)    Hypokalemia (POA: Yes)    ESRD (end stage renal disease) on dialysis (CMS-HCC) (POA: Not Applicable)    Hyperphosphatemia (POA: Yes)    Poor fluid intake (POA: Yes)    Hypovolemia (POA: Yes)    Dysgeusia (POA: Yes)    Fatigue (POA: Yes)    Dysuria (POA: Yes)    Physical deconditioning (POA: Yes)    Malnutrition (CMS-HCC) (POA: Yes)  Resolved Problems:    * No resolved hospital problems. [facility] Course:   Outpatient follow up:  [ ]  follow up with vascular surgery in outpatient setting for long term HD access planning.   [ ]  has an appointment for PD catheter removal .    Hypotension - Poor PO Intake, Altered Taste - Fatigue - Dysuria, Leukocytosis- all resolved  Patient presented for a second time in the past month with nonspecific symptoms of altered taste, poor p.o. intake, fatigue, and possibly dysuria.  It seems most likely that her symptoms were due to low-grade uremia from poor exchange across her peritoneum during PD.  Her symptoms resolved with initiation of midodrine for chronic hypotension, mirtazapine for appetite stimulation, and ultimately transition to intermittent hemodialysis.  Initial hospital course complicated by refeeding which had resolved for several days prior to discharge.  - Continue mirtazapine 50 mg nightly  - Status post empiric course of antibiotics for indeterminant urinalysis with lower urinary tract symptoms  - Continue midodrine 5 mg 3 times daily  - Daily multivitamin on discharge  - Transition to intermittent hemodialysis as below    ESRD on PD  During last admission, increased time of PD to 12-18 hours per pt due to concern for possible uremia contributing to her poor PO intake and lethargy.  Trialed several different peritoneal dialysis prescriptions and ultimately made the decision to transition to IHD.  Dialyzed and felt much better at time of discharge.  Tunneled line was placed prior to discharge.  Will need follow-up in outpatient setting for planning of definitive hemodialysis access.  - Outpatient follow-up with vascular surgery for fistula planning  - Continue IHD at previous dialysis center  - Continue sevelamer 2400 mg daily     Deconditioning -   Received PT OT while inpatient and will discharge home with home health.    Normocytic Anemia  Hgb 10.1, improved from prior but may reflect hemoconcentration iso hypovolemia.   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     The patient's hospital stay has been complicated  by the following clinically significant conditions requiring additional evaluation and treatment or having a significant effect of this patient's care: - Malnutrition POA requiring further investigation, treatment, or monitoring  - Anemia POA requiring further investigation or monitoring  - Chronic kidney disease POA requiring further investigation, treatment, or monitoring  - Age related debility POA requiring additional resources: DME, PT, or OT  - Hypokalemia POA requiring further investigation, treatment, or monitoring        Outpatient Provider Follow Up Issues:   Ensure she follows up with vascular surgery in the outpatient setting    Touchbase with Outpatient Provider:  Warm Handoff: Completed on 06/29/23 by Anastasio Champion, MD  (Resident) via Epic Secure Chat    Procedures:  Tunneled line placement  ______________________________________________________________________  Discharge Medications:      Your Medication List        STOP taking these medications      polyethylene glycol 17 gram packet  Commonly known as: MIRALAX            START taking these medications      mirtazapine 15 MG tablet  Commonly known as: REMERON  Take 1 tablet (15 mg total) by mouth nightly.     multivitamin, therapeutic with minerals 9 mg/15 mL iron Liqd  Commonly known as: CENTRUM  Take 15 mL by mouth daily for 16 days.     oxyCODONE 5 MG immediate release tablet  Commonly known as: ROXICODONE  Take 1 tablet (5 mg total) by mouth every four (4) hours as needed for pain.            CHANGE how you take these medications      sevelamer 800 mg tablet  Commonly known as: RENVELA  Take 3 tablets (2,400 mg total) by mouth Three (3) times a day with a meal.  What changed:   how much to take  Another medication with the same name was removed. Continue taking this medication, and follow the directions you see here.            CONTINUE taking these medications      albuterol 90 mcg/actuation inhaler  Commonly known as: PROVENTIL HFA;VENTOLIN HFA  Inhale 2 puffs every six (6) hours as needed for wheezing.     apixaban 2.5 mg Tab  Commonly known as: ELIQUIS  Take 1 tablet (2.5 mg total) by mouth two (2) times a day.     aspirin 81 MG chewable tablet  Chew 1 tablet (81 mg total) daily.     AURYXIA ORAL  Take 1 g by mouth Three (3) times a day.     calcitriol 0.5 MCG capsule  Commonly known as: ROCALTROL  Take 1 capsule (0.5 mcg total) by mouth daily.     cinacalcet 60 MG tablet  Commonly known as: SENSIPAR  Take 1 tablet (60 mg total) by mouth daily.     COSENTYX PEN (2 PENS) 150 mg/mL Pnij injection  Generic drug: secukinumab  Inject the contents of 2 pens (300 mg total) under the skin every twenty-eight (28) days. Maintenance dose.     furosemide 40 MG tablet  Commonly known as: LASIX  Take 1 tablet up to 3 times weekly as needed for leg swelling.     midodrine 5 MG tablet  Commonly known as: PROAMATINE  Take 1 tablet (5 mg total) by mouth two (2) times a day as needed. (Take 1 tab after PD. Please check your BP each afternoon, take 1 tab  for systolic pressure less than 90)     nystatin 100,000 unit/gram powder  Commonly known as: MYCOSTATIN  Apply under breasts two times daily until resolution     QVAR REDIHALER 80 mcg/actuation inhaler  Generic drug: beclomethasone dipropionate  Inhale 1 puff  in the morning and 1 puff in the evening.     senna 8.6 mg tablet  Commonly known as: SENOKOT  Take 2 tablets by mouth daily.     senna-docusate 8.6-50 mg  Commonly known as: PERICOLACE  Take 2 tablets by mouth daily.     tizanidine 2 MG tablet  Commonly known as: ZANAFLEX  Take 1 tablet (2 mg total) by mouth daily as needed.     triamcinolone 0.1 % ointment  Commonly known as: KENALOG  Apply topically two (2) times a day as needed. Use on psoriasis until flat.              Allergies:  Cephalexin, Mango, Peach (prunus persica), Wellbutrin [bupropion hcl], Egg, Fish containing products, Lactase-rennet, Lexiscan [regadenoson], and Mushroom  ______________________________________________________________________  Pending Test Results:      Most Recent Labs:  All lab results last 24 hours -   Recent Results (from the past 24 hour(s))   Hemoglobin and Hematocrit    Collection Time: 06/28/23 11:43 AM   Result Value Ref Range    HGB 8.8 (L) 11.3 - 14.9 g/dL    HCT 16.1 (L) 09.6 - 44.0 %       Relevant Studies/Radiology:  IR Insert Tunneled Catheter (Age Greater Than 5 Years)    Result Date: 06/27/2023  PROCEDURE: Tunneled central venous catheter placement ACCESSION: [id number] UN Procedural Personnel Attending physician(s): Cathie Hoops Resident physician(s): Lequita Halt Advanced practice provider(s): None Procedure Date (mm/dd/yyyy): 06/27/2023 4:32 PM Pre-procedure diagnosis: Renal dysfunction Post-procedure diagnosis: Same Indication: Other-hemodialysis Additional clinical history: None _______________________________________________________________ PROCEDURE SUMMARY: - Venous access with ultrasound guidance - Tunneled central venous catheter insertion with fluoroscopic guidance - Additional procedure(s): None PROCEDURE DETAILS: Pre-procedure Consent: Informed consent for the procedure including risks, benefits and alternatives was obtained and time-out was performed prior to the procedure. Preparation: The site was prepared and draped using maximal sterile barrier technique including cutaneous antisepsis. Anesthesia/sedation Level of anesthesia/sedation: Moderate sedation (conscious sedation) Anesthesia/sedation administered by: Independent trained observer under attending supervision with continuous monitoring of the patient's level of consciousness and physiologic status Total intra-service sedation time (minutes): I personally spent 77 minutes, continuously monitoring the patient face-to-face during the administration of moderate sedation. Radiology nurse was present for the duration of the procedure to assist in patient monitoring. Pre and Post Sedation activities have been reviewed. Access Local anesthesia was administered. The vessel was sonographically evaluated and determined to be patent. Real time ultrasound was used to visualize needle entry into the vessel and a permanent image was stored. Access was lost after initial advancement of the wire, so the wire and access needle were removed and pressure was held for approximately 5 minutes. Hemostasis was achieved. Real-time ultrasound was again used to visualize needle entry into the vessel and another permanent image was stored. Laterality: Right Vein accessed: Internal jugular vein Access technique: Micropuncture set with 21 gauge needle Venography Indication for venography: Not applicable Vein catheterized: Not applicable Findings: Not applicable Catheter placement An incision was made near the venous access site and the catheter was tunneled subcutaneously to the venous access site. The catheter was advanced via a peel-away sheath into the vein under fluoroscopic guidance. Catheter tip location was fluoroscopically verified and a permanent  image was stored. Catheter placed: 23 cm hemodialysis Lot number: na Catheter size Janice Norrie): 14.5 Lumens: 2 Power injectable: Yes Catheter tip: cavoatrial junction Catheter flush: Heparin (1000 units/mL) Closure A sterile dressing was applied. Access site closure technique: Tissue adhesive Catheter securement technique: Non-absorbable suture Contrast Contrast agent: None Contrast volume (mL): N/A Radiation Dose Fluoroscopy time (minutes): 5.2 Reference Air Kerma (mGy): 40.2 Additional Details Additional description of procedure: None Registry event: V/3/g Device used: None Equipment details: None Unique Device Identifiers: Not available Specimens removed: None Estimated blood loss (mL): Less than 10 Standardized report: [email or web address]_v3.1 Attestation Signer name: Ammie Dalton I attest that I was present for the entire procedure. I reviewed the stored images and agree with the report as written. _______________________________________________________________ Complications: No immediate complications.     Insertion of right-sided tunneled Dual-lumen Hemodialysis central venous catheter in the internal jugular vein, with tip in the expected location of the cavoatrial junction. Plan: The catheter may be used immediately.     ECG 12 Lead    Result Date: 06/24/2023  POOR DATA QUALITY, INTERPRETATION MAY BE ADVERSELY AFFECTED NORMAL SINUS RHYTHM PROLONGED QT ABNORMAL ECG WHEN COMPARED WITH ECG OF AUG 2024 NO SIGNIFICANT CHANGE WAS FOUND Confirmed by Christella Noa ([id number]) on 06/24/2023 8:43:09 AM   ______________________________________________________________________  Discharge Instructions:   Activity Instructions       Activity as tolerated                       Follow Up instructions and Outpatient Referrals     Ambulatory Referral to Home Health      Reason for referral: Decreased mobility, Fall risk, Impaired ADLs,   Impaired sensation, Decreased endurance, Impaired balance, Decreased   strength    Physician to follow patient's care: PCP    Disciplines requested:  Nursing  Physical Therapy  Occupational Therapy       Nursing requested: Other: (please enter in comments) Comment - MEDICATION   MANAGEMENT AND EDUCATION    Physical Therapy requested: Evaluate and treat    Occupational Therapy Requested: Evaluate and treat    Requested SOC Date:  Comment - WITHIN 48 HOURS    Ambulatory Referral to Vascular Surgery      Reason for referral: fistula placement/planning.    Requested follow up plan: You would evaluate and manage.    Call MD for:  difficulty breathing, headache or visual disturbances      Call MD for:  persistent nausea or vomiting      Call MD for:  severe uncontrolled pain      Call MD for:  temperature >38.5 Celsius      Discharge instructions          Appointments which have been scheduled for you      Jul 09, 2023 2:30 PM  (Arrive by 2:10 PM)  NEW  GENERAL with Dyann Ruddle, MD  [facility] GENERAL AND BARIATRIC SURGERY [facility] REGION) [address]  [address]  [place] [address]  [phone]        Jul 10, 2023 2:30 PM  (Arrive by 2:15 PM)  ECHOCARDIOGRAM W COLORFLOW SPECTRAL DOPPLER with ET FL 2 ECHO RM 1  [facility] [place]  ([facility] [place] [facility] [place]) [address]  [facility] 1 through 4  [place] [address]  [phone]        Jul 23, 2023 2:30 PM  (Arrive by 2:00 PM)  RETURN VIDEO [place] with Roe Coombs,  PhD  [facility] PA[facility]NA[facility] C[facility] [address] [facility] REGION) [address]  [address]  [place] [address]  [phone]   Please sign into My [facility] Chart at least 15 minutes before your appointment to complete the eCheck-In process. You must complete eCheck-In before you can start your video visit. We also recommend testing your audio and video connection to troubleshoot any issues before your visit begins. Click ???Join Video Visit??? to complete these checks. Once you have completed eCheck-In and tested your audio and video, click ???Join Call??? to connect to your visit.     For your video visit, you will need a computer with a working camera, speaker and microphone, a smartphone, or a tablet with internet access.    My [facility] Chart enables you to manage your health, send non-urgent messages to your provider, view your test results, schedule and manage appointments, and request prescription refills securely and conveniently from your computer or mobile device.    You can go to [email or web address] to sign in to your My [facility] Chart account with your username and password. If you have forgotten your username or password, please choose the ???Forgot Username???? and/or ???Forgot Password???? links to gain access. You also can access your My [facility] Chart account with the free MyChart mobile app for Android or iPhone.    If you need assistance accessing your My  Chart account or for assistance in reaching your provider's office to reschedule or cancel your appointment, please call [facility] [phone].         Nov 20, 2023 11:00 AM  (Arrive by 10:30 AM)  PVL ARTERIAL DUPLEX UPPER EXTREMITY LEFT with [facility] PVL OUTPATIENT 1  IMG PVL [facility]) [address]  [place] [address]  [phone]        Nov 20, 2023 12:15 PM  (Arrive by 11:45 AM)  RETURN  GENERAL with Earney Mallet, MD  [facility] VASCULAR SURGERY [facility] REGION) [address]  [place] [address]  [phone]             ______________________________________________________________________  Discharge Day Services:  BP 91/45  - Pulse 102 - Temp 36.6 ??C (97.9 ??F) (Oral)  - Resp 20  - Ht 157.2 cm (5' 1.89)  - Wt 81.1 kg (178 lb 10.9 oz)  - LMP  (LMP Unknown)  - SpO2 97%  - BMI 32.80 kg/m??     Pt seen on the day of discharge and determined appropriate for discharge.    Condition at Discharge: stable    Length of Discharge: I spent greater than 30 mins in the discharge of this patient.

## 2023-07-01 DIAGNOSIS — Z992 Dependence on renal dialysis: Principal | ICD-10-CM

## 2023-07-01 DIAGNOSIS — N186 End stage renal disease: Principal | ICD-10-CM

## 2023-07-01 NOTE — Unmapped (Signed)
You have a fax in your box requiring your signature. (7 pgs.)

## 2023-07-01 NOTE — Unmapped (Signed)
Referral received for evaluation for HD access creation.  BUE vessel mapping order entered.

## 2023-07-01 NOTE — Unmapped (Signed)
You have a fax in your box requiring your signature. (2 pgs.)

## 2023-07-04 MED ORDER — HEPARIN (PORCINE) 1,000 UNIT/ML INJECTION SOLUTION
0 refills | 0 days
Start: 2023-07-04 — End: ?

## 2023-07-05 MED ORDER — HEPARIN (PORCINE) 1,000 UNIT/ML INJECTION SOLUTION
0 refills | 0 days | Status: CP
Start: 2023-07-05 — End: ?

## 2023-07-08 ENCOUNTER — Ambulatory Visit: Admit: 2023-07-08 | Discharge: 2023-07-09 | Payer: MEDICARE

## 2023-07-09 ENCOUNTER — Ambulatory Visit: Admit: 2023-07-09 | Discharge: 2023-07-10

## 2023-07-09 NOTE — Unmapped (Signed)
Form faxed to [phone]

## 2023-07-09 NOTE — Unmapped (Signed)
Clinical Staff Clinic Rooming for ESRD Patients for Dr. Dyann Ruddle    Name of Dialysis Center: [facility]  Attending Nephrologist: Lyla Glassing, MD     Dialysis Weekly Schedule  [x]  Monday  []  Tuesday  [x]  Wednesday  []  Thursday  []  Friday  [x]  Saturday  []  Sunday   In the am

## 2023-07-09 NOTE — Unmapped (Signed)
Assessment/Plan:      Assessment: ESRD on hemodialysis. Has a tunneled catheter. Recently had to switch from PD to hemodialysis due to poor clearance. 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      52 y.o.female with  ESRD  with current requirement for vascular access to facilitate hemodialysis.    Existing fistula / graft: Functional status & general condition:  N/A - does not have existing fistula / graft.       Plan: Will schedule for creation of arteriovenous access procedure for the right arm. Will plan fistula, but may need graft. Aware of the problems that she experienced in the past with arterial thrombosis. She will have to stop Eliquis for surgery. We will have her continue aspirin therapy. Will use heparin intraoperatively and resume the Eliquis immediately postop.     Additional Diagnostic Studies:  none        Pre-Procedural Discussion / Communication:      - Details of the procedure (including risks, benefits and alternatives) have been discussed / reviewed with the patient.  - The patient has been given the opportunity to ask questions about the procedure and all questions have been answered to the patient's satisfaction.  - Informed consent for the procedure has been obtained.  - Potential risks of the proposed procedure have been reviewed with the patient. The risks outlined include (but not limited to); Generic Risks (that may occur with any form of access procedure): bleeding, hematoma, thrombotic / embolic complications, arterial injury, arterial dissection, arterio-venous steal, acute limb ischemia, nerve injury, medication allergy, infection, myocardial infarction, cardiac dysrhythmia, stroke, complications of anesthesia / sedation, pneumonia / aspiration event, and small risk of death..    Patient advised to continue taking current anti-platelet agents including aspirin (&/or clopidogrel) up to day of procedure.          Subjective:     Subjective :    Ms.Ahlers is a 52 y.o. female with  ESRD . She has been referred for initial evaluation, regarding vascular access options with me, but has had previous access procedures in the past that have been complicated.     Surgical Access History:  Left brachiocephalic AVF. Good thrill in OR, then thrombosed. Left axillary AV loop graft. Thrombosed. Thrombosis of native arterial circulation. Thromboembolectomy, Eventual left subclavian to brachial artery bypass with greater saphenous vein.     Catheter Placement History: The patient has previously undergone the tunneled catheter placement in the following location(s): right internal jugular vein. Previously had right internal jugular catheter as well.     .    Review of Past History / Family History / Problem List:    The following portions of the patient's history were reviewed and updated as appropriate: past history, family history, and problem list.    Review of Systems     Objective:     Objective :    Physical Exam  Palpable radial pulses bilaterally.  No edema    Had PVL vein mapping done yesterday 9/16.   I reviewed. No mention made of the past arterial issues, but normal signals noted throughout both arms  Veins small, but has visible antecubital vein in the right antecubital fossa

## 2023-07-10 ENCOUNTER — Ambulatory Visit: Admit: 2023-07-10 | Discharge: 2023-07-10

## 2023-07-10 DIAGNOSIS — I319 Disease of pericardium, unspecified: Principal | ICD-10-CM

## 2023-07-11 NOTE — Unmapped (Signed)
You have a fax in your box requiring your signature.

## 2023-07-16 NOTE — Unmapped (Signed)
You have a fax in your box requiring your signature.

## 2023-07-22 NOTE — Unmapped (Signed)
Form faxed to [phone]

## 2023-07-22 NOTE — Unmapped (Signed)
Visit cancelled.

## 2023-07-22 NOTE — Unmapped (Signed)
The [facility] has made a second and final attempt to reach this patient to refill the following medication:Humira.      We have left voicemails on the following phone numbers: [phone] and have sent a text message to the following phone numbers: [phone] .    Dates contacted: 06/19/2023  07/22/2023  Last scheduled delivery: 05/08/2023 (56 day supply)    The patient may be at risk of non-compliance with this medication. The patient should call the [facility] at [phone]  Option 4, then Option 2: Dermatology, Gastroenterology, Rheumatology to refill medication.    Megan Robertson

## 2023-07-23 ENCOUNTER — Telehealth: Admit: 2023-07-23 | Discharge: 2023-07-24 | Payer: MEDICARE | Attending: Clinical | Primary: Clinical

## 2023-07-23 NOTE — Unmapped (Signed)
[facility] Specialty and Home Delivery Pharmacy Refill Coordination Note    Specialty Medication(s) to be Shipped:   Inflammatory Disorders: Cosentyx    Other medication(s) to be shipped: No additional medications requested for fill at this time     Megan Robertson, DOB: March 29, 1971  Phone: [phone] (home)       All above HIPAA information was verified with patient.     Was a Nurse, learning disability used for this call? No    Completed refill call assessment today to schedule patient's medication shipment from the [facility]  [phone]).  All relevant notes have been reviewed.     Specialty medication(s) and dose(s) confirmed: Regimen is correct and unchanged.   Changes to medications: Megan Robertson reports no changes at this time.  Changes to insurance: No  New side effects reported not previously addressed with a pharmacist or physician: None reported  Questions for the pharmacist: No    Confirmed patient received a Conservation officer, historic buildings and a Surveyor, mining with first shipment. The patient will receive a drug information handout for each medication shipped and additional FDA Medication Guides as required.       DISEASE/MEDICATION-SPECIFIC INFORMATION        For patients on injectable medications: Patient currently has 0 doses left.  Next injection is scheduled for 07/28/23.    SPECIALTY MEDICATION ADHERENCE     Medication Adherence    Patient reported X missed doses in the last month: 0  Specialty Medication: COSENTYX PEN (2 PENS) 150 mg/mL Pnij injection (secukinumab)  Patient is on additional specialty medications: No  Patient is on more than two specialty medications: No  Any gaps in refill history greater than 2 weeks in the last 3 months: no  Demonstrates understanding of importance of adherence: yes              Were doses missed due to medication being on hold? No       COSENTYX PEN (2 PENS) 150 mg/ml: 0 days of medicine on hand       REFERRAL TO PHARMACIST     Referral to the pharmacist: Not needed      [facility]     Shipping address confirmed in Epic.       Delivery Scheduled: Yes, Expected medication delivery date: 07/25/23.     Medication will be delivered via Same Day Courier to the prescription address in Epic WAM.    Megan Robertson   [facility] Delivery [facility]  Specialty Technician

## 2023-07-23 NOTE — Unmapped (Signed)
Confidential Telehealth Psychology Note  [facility]       Patient Name: Megan Robertson  Medical Record Number: [id number]  Date of Service: July 23, 2023  Attending Psychologist: Colon Branch, PhD   CPT Procedure Code: 81191 for 45 minutes of audio/visual based counseling   Therapy Type: Behavior Modifying/Acceptance and Commitment Therapy (ACT)  Purpose of Treatment: reduce depression symptoms, reduce anxiety symptoms, improve quality of life, improve chronic pain coping    Subjective:   This visit was performed face to face with interactive technology using a HIPPA compliant audio/visual platform. We reviewed confidentiality today. The patient was present in [place], a state in which this provider is licensed and able to provide care (location and contact information confirmed), attended this visit alone, and consented to this virtual pain psychology visit.    [name] is a pleasant 52 y.o.  female with chronic pain and anxiety.  She is being seen via telehealth for this visit. Pt described her mood as fine and presented with slightly anxious and guarded affect. She described two recent hospitalizations and switch in treatment. Patient notes that she feels good and is grateful she is still here. Reviewed current sleep schedule an difficulty getting to dialysis for early morning appointments. Patient expressed satisfaction with current sleep. Tracked her experience with home PT and OT. Also discussed how patient is coping with potentially not being eligible for transplant. Discussed prior conversation about [place] meeting monthly despite our last session being in July. Explored her interest in continuing to meet given her seeming guarded and indifferent during out session today. Patient expressed continued interest in meeting for monthly sessions.    Objective / Mental Status Exam:  Speech/Language:    Normal rate, volume, tone, fluency   Mood:   Depressed and Anxious   Thought process:   Logical, linear, clear, coherent, goal directed   Thought content:    Denies SI, HI, self harm, delusions, obsessions, paranoid ideation, or ideas of reference   Perceptual disturbances:    Denies auditory and visual hallucinations, behavior not concerning for response to internal stimuli   Orientation:  Oriented to person, place, time, and general circumstances   Attention:   Able to fully attend without fluctuations in consciousness   Concentration:   Able to fully concentrate and attend   Memory:   Immediate, short-term, long-term, and recall grossly intact    Fund of knowledge:    Consistent with level of education and development   Insight:     Fair   Judgment:    Intact   Impulse Control:   Intact       Assessment:  [name] participated well in this Acceptance and Commitment Therapy (ACT) based session. She was less engaged during today's session and appeared distracated.    Diagnostic Impression:   Anxiety  Persistent depressive disorder      Plan:    [name] will follow up for teletherapy in approximately one month. Colon Branch, PhD, DBSM  Clinical Pain Psychologist

## 2023-07-25 MED FILL — COSENTYX PEN 300 MG/2 PENS (150 MG/ML) SUBCUTANEOUS: SUBCUTANEOUS | 56 days supply | Qty: 4 | Fill #2

## 2023-08-15 ENCOUNTER — Ambulatory Visit: Admit: 2023-08-15 | Discharge: 2023-08-15 | Payer: MEDICARE

## 2023-08-15 ENCOUNTER — Encounter: Admit: 2023-08-15 | Discharge: 2023-08-15 | Attending: Anesthesiology | Primary: Anesthesiology

## 2023-08-15 LAB — PATIENT NEEDLESTICK PACKAGE
HEPATITIS B SURFACE ANTIGEN: NONREACTIVE
HEPATITIS C ANTIBODY: NONREACTIVE
HIV ANTIGEN/ANTIBODY COMBO: NONREACTIVE

## 2023-08-15 LAB — POTASSIUM: POTASSIUM: 4.1 mmol/L (ref 3.4–4.8)

## 2023-08-15 MED ORDER — OXYCODONE 5 MG TABLET
ORAL_TABLET | ORAL | 0 refills | 1 days | Status: CP | PRN
Start: 2023-08-15 — End: 2023-08-20
  Filled 2023-08-15: qty 5, 1d supply, fill #0

## 2023-08-15 MED ADMIN — Propofol (DIPRIVAN) injection: INTRAVENOUS | @ 17:00:00 | Stop: 2023-08-15

## 2023-08-15 MED ADMIN — heparin (porcine) 1000 unit/mL injection: INTRAVENOUS | @ 17:00:00 | Stop: 2023-08-15

## 2023-08-15 MED ADMIN — phenylephrine 1 mg/10 mL (100 mcg/mL) injection Syrg: INTRAVENOUS | @ 17:00:00 | Stop: 2023-08-15

## 2023-08-15 MED ADMIN — dexAMETHasone (DECADRON) 4 mg/mL injection: INTRAVENOUS | @ 17:00:00 | Stop: 2023-08-15

## 2023-08-15 MED ADMIN — sodium chloride (NS) 0.9 % infusion: INTRAVENOUS | @ 16:00:00 | Stop: 2023-08-15

## 2023-08-15 MED ADMIN — ropivacaine (NAROPIN) 5 mg/mL (0.5 %) injection: PERINEURAL | @ 15:00:00 | Stop: 2023-08-15

## 2023-08-15 MED ADMIN — fentaNYL (PF) (SUBLIMAZE) injection: INTRAVENOUS | @ 15:00:00 | Stop: 2023-08-15

## 2023-08-15 MED ADMIN — heparin (porcine) 2,500 Units in sodium chloride irrigation (NS) 120 mL OR irrigation: @ 18:00:00 | Stop: 2023-08-15

## 2023-08-15 MED ADMIN — heparin (porcine) 2,500 Units in sodium chloride irrigation (NS) 250 mL OR irrigation: @ 18:00:00 | Stop: 2023-08-15

## 2023-08-15 MED ADMIN — lidocaine (XYLOCAINE) 10 mg/mL (1 %) injection: @ 18:00:00 | Stop: 2023-08-15

## 2023-08-15 MED ADMIN — oxyCODONE (ROXICODONE) immediate release tablet 5 mg: 5 mg | ORAL | @ 20:00:00 | Stop: 2023-08-15

## 2023-08-15 MED ADMIN — Propofol (DIPRIVAN) injection: INTRAVENOUS | @ 16:00:00 | Stop: 2023-08-15

## 2023-08-15 MED ADMIN — midazolam (VERSED) oral syrup: ORAL | @ 15:00:00 | Stop: 2023-08-15

## 2023-08-15 MED ADMIN — ketamine (KETALAR) injection: INTRAVENOUS | @ 17:00:00 | Stop: 2023-08-15

## 2023-08-15 MED ADMIN — clindamycin (CLEOCIN) injection: INTRAVENOUS | @ 16:00:00 | Stop: 2023-08-15

## 2023-08-15 NOTE — Unmapped (Signed)
Brief Pre-operative History & Physical    Patient name: Megan Robertson  CSN: [id number]  MRN: [id number]  Admit Date: 08/15/2023  Date of Surgery: 08/15/2023  Performing Service: General Surgery    Code Status: Full Code      Assessment/Plan:      Megan Robertson is a 52 y.o. female with ESRD on hemodialysis, who presents for:  Procedure(s) (LRB):  ARTERIOVENOUS ANASTOMOSIS, OPEN; DIRECT, ANY SITE, UPPER EXTREMITY (Right).     She presents for:  Procedure(s) (LRB):  ARTERIOVENOUS ANASTOMOSIS, OPEN; DIRECT, ANY SITE, UPPER EXTREMITY (Right).     Consent was obtained in the pre-op holding area. Risks, benefits, and alternatives to surgery were discussed, and all questions were answered.    Proceed to the OR as planned.       History of Present Illness:    Megan Robertson is a 53 y.o. female with ESRD on hemodialysis. She was seen in the clinic last on 07/09/2023.  Last dialysis was yesterday and she did a full session without difficulty. Potassium today is 4.1.  She does take Eliquis, states her last dose was 4 days ago.   She has no complaints of chest pain, SOB, LE swelling.  No fevers, recent sickness, or antibiotic use.  She denies any changes in her health, or hospitalizations she she was last seen.       She was noted to benefit from:  Procedure(s) (LRB):  ARTERIOVENOUS ANASTOMOSIS, OPEN; DIRECT, ANY SITE, UPPER EXTREMITY (Right).       Allergies  Cephalexin, Mango, Peach (prunus persica), Wellbutrin [bupropion hcl], Egg, Fish containing products, Lactase-rennet, Lexiscan [regadenoson], and Mushroom    Home Medications    Current Facility-Administered Medications   Medication Dose Route Frequency Provider Last Rate Last Admin    clindamycin (CLEOCIN) 900 mg/50 mL IVPB 900 mg  900 mg Intravenous For OR use Dyann Ruddle, MD        sodium chloride (NS) 0.9 % infusion  10 mL/hr Intravenous Continuous Dyann Ruddle, MD         Facility-Administered Medications Ordered in Other Encounters   Medication Dose Route Frequency Provider Last Rate Last Admin    fentaNYL (PF) (SUBLIMAZE) injection   Intravenous PRN (once a day) Alba Cory, MD   50 mcg at 08/15/23 1040    midazolam (VERSED) oral syrup   Oral PRN (once a day) Alba Cory, MD   1 mg at 08/15/23 1040    ropivacaine (NAROPIN) 5 mg/mL (0.5 %) injection   Perineural PRN (once a day) Alba Cory, MD   15 mL at 08/15/23 1045     Current Outpatient Medications   Medication Instructions    albuterol HFA 90 mcg/actuation inhaler 2 puffs, Inhalation, Every 6 hours PRN    aspirin 81 mg, Oral, Daily (standard)    beclomethasone dipropionate (QVAR REDIHALER) 80 mcg/actuation inhaler 1 puff, Inhalation, 2 times daily (RT)    calcitriol (ROCALTROL) 0.5 mcg, Oral, Daily (standard)    cinacalcet (SENSIPAR) 60 mg, Oral, Daily (standard)    ELIQUIS 2.5 mg, Oral, 2 times a day (standard)    ferric citrate (AURYXIA ORAL) 1 g, Oral, 3 times a day (standard)    furosemide (LASIX) 40 MG tablet Take 1 tablet up to 3 times weekly as needed for leg swelling.    heparin sodium,porcine (HEPARIN, PORCINE,) 1,000 unit/mL 1000 unit/mL injection ADMINISTER 1 ML PER 1,000 ML OF PERITONEAL DIALYSIS SOLUTION AS DIRECTED    heparin sodium,porcine/PF (HEP FLUSH-10, PF, IV)  Heparin Sodium (Porcine) 1,000 Units/mL Catheter Lock Arterial    midodrine (PROAMATINE) 5 mg, Oral, 2 times a day PRN, (Take 1 tab after PD. Please check your BP each afternoon, take 1 tab for systolic pressure less than 90)    mirtazapine (REMERON) 15 mg, Oral, Nightly    nystatin (MYCOSTATIN) 100,000 unit/gram powder Apply under breasts two times daily until resolution    secukinumab (COSENTYX PEN, 2 PENS,) 150 mg/mL PnIj injection Inject the contents of 2 pens (300 mg total) under the skin every twenty-eight (28) days. Maintenance dose.    senna (SENOKOT) 8.6 mg tablet 2 tablets, Oral, Daily (standard)    senna-docusate (PERICOLACE) 8.6-50 mg 2 tablets, Oral, Daily (standard)    sevelamer (RENVELA) 2,400 mg, Oral, 3 times a day (with meals)    tizanidine (ZANAFLEX) 2 mg, Oral, Daily PRN    triamcinolone (KENALOG) 0.1 % ointment Topical, 2 times a day PRN, Use on psoriasis until flat.         Vital Signs  BP 143/94  - Pulse 93  - Temp 37.1 ??C (98.8 ??F)  - Ht 157.5 cm (5' 2)  - Wt 81.3 kg (179 lb 3.2 oz)  - LMP  (LMP Unknown)  - SpO2 100%  - BMI 32.78 kg/m??   Facility age limit for growth %iles is 20 years.  Facility age limit for growth %iles is 20 years..     Physical Exam  General: Well developed, appears stated age, in no acute distress  Mental status: Alert and oriented x3  Cardiovascular:  Normal rate   Pulmonary: Symmetric chest rise, unlabored breathing  Relevant System for Surgery: Surgical site examined, and pt has intact skin/pulse.     Labs and Studies:  Lab Results   Component Value Date    WBC 13.2 (H) 06/28/2023    HGB 8.8 (L) 06/28/2023    HCT 26.4 (L) 06/28/2023    PLT 269 06/28/2023       Lab Results   Component Value Date    PT 11.4 09/15/2022    INR 1.02 09/15/2022    APTT 30.8 03/23/2022       Relevant Past Medical History:  Past Medical History:   Diagnosis Date    Abnormal mammogram     Abnormal Pap smear of cervix     Allergic     Anemia     Anxiety     Arthritis     Asthma     Back pain     Cancer (CMS-HCC) Ovarian    Depression     Diabetes mellitus (CMS-HCC) Borderline    Eczema     ESRD (end stage renal disease) on dialysis (CMS-HCC)     FSGS (focal segmental glomerulosclerosis) 1997    renal biopsy at [facility]    GERD (gastroesophageal reflux disease)     Gout     H/O adenoidectomy     had adenoids removed    Hypercholesteremia     Hypertension     Keloid     Ovarian cancer (CMS-HCC)     Pancreatitis     Psoriasis     QT prolongation 06/04/2023    Seizure (CMS-HCC)     unknown etiology; none for several years    Sepsis (CMS-HCC) 09/16/2022    Stroke (CMS-HCC) 2000    Tachycardia 07/31/2020       Relevant Past Surgical History:  Past Surgical History:   Procedure Laterality Date    APPENDECTOMY  BREAST EXCISIONAL BIOPSY Right 11/28/2016    CHEMOTHERAPY      for ovarian CA in 2000    CHG US GUIDE, VASCULAR ACCESS N/A 01/29/2020    Procedure: ULTRASOUND GUIDANCE FOR VASC ACCESS REQUIRING [place] EVAL OF POTENTIAL ACCESS SITES;  Surgeon: Leona Carry, MD;  Location: MAIN OR [facility];  Service: Transplant    CHG US GUIDE, VASCULAR ACCESS N/A 06/08/2020    Procedure: ULTRASOUND GUIDANCE FOR VASC ACCESS REQUIRING [place] EVAL OF POTENTIAL ACCESS SITES;  Surgeon: Leona Carry, MD;  Location: MAIN OR [facility];  Service: Transplant    CHOLECYSTECTOMY      COMBINED HYSTEROSCOPY DIAGNOSTIC / D&C  07/07/2015    Planned for endometrial ablation, but patient had uterine perforation after D&C and unable to perform    HYSTERECTOMY      LEFT OOPHORECTOMY Left 04/12/2000    Removed with L fallopian tube for ectopic pregnancy    OOPHORECTOMY      PR COLONOSCOPY W/BIOPSY SINGLE/MULTIPLE  09/12/2021    Procedure: COLONOSCOPY, FLEXIBLE, PROXIMAL TO SPLENIC FLEXURE; WITH BIOPSY, SINGLE OR MULTIPLE;  Surgeon: Maris Berger, MD;  [facility];  Service: Gastroenterology    PR COLSC FLX W/RMVL OF TUMOR POLYP LESION SNARE TQ N/A 09/12/2021    Procedure: COLONOSCOPY FLEX; W/REMOV TUMOR/LES BY SNARE;  Surgeon: Maris Berger, MD;  [facility]: [facility];  Service: Gastroenterology    PR CREAT AV FISTULA,AUTOGENOUS GRAFT Left 01/29/2020    Procedure: Create Av Fistula (Separt Proc); Autog Gft;  Surgeon: Leona Carry, MD;  Location: MAIN OR [facility];  Service: Transplant    PR CREAT AV FISTULA,NON-AUTOGENOUS GRAFT Left 06/08/2020    Procedure: CREATE AV FISTULA (SEPARATE PROC); NONAUTOGENOUS GRAFT (EG, BIOLOGICAL COLLAGEN, THERMOPLASTIC GRAFT);  Surgeon: Leona Carry, MD;  Location: MAIN OR [facility];  Service: Transplant    PR EXPLORATION N/FLWD SURG UPPER EXTREMITY ARTERY Left 08/02/2020    Procedure: Exploration Not Followed By Surgical Repair, Artery; Upper Extremity (Eg, Axillary, Brachial, Radial, Ulnar);  Surgeon: Earney Mallet, MD;  Location: MAIN OR [facility];  Service: Vascular    PR INCIS/DRAIN ARM,DEEP ABSC/HEMATOMA Left 08/14/2020    Procedure: INCISION AND DRAINAGE, UPPER ARM OR ELBOW AREA; DEEP ABSCESS OR HEMATOMA;  Surgeon: Earney Mallet, MD;  Location: MAIN OR ;  Service: Vascular    PR INSERTION TUNNEL INTRAPERITONEAL CATH DIAL OPEN Midline 02/01/2021    Procedure: INSERTION OF INTRAPERITONEAL CANNULA OR CATHETER FOR DRAINAGE OR DIALYSIS; PERMANENT;  Surgeon: Loney Hering, MD;  Location: MAIN OR ;  Service: Transplant    PR REMV ART CLOT AXILL-BRACH,ARM INCIS Left 08/02/2020    Procedure: Embolect/Thrombec; Axilry/Brachial Art-Arm Incs;  Surgeon: Earney Mallet, MD;  Location: MAIN OR [facility];  Service: Vascular    PR UPPER GI ENDOSCOPY,BIOPSY N/A 08/29/2017    Procedure: UGI ENDOSCOPY; WITH BIOPSY, SINGLE OR MULTIPLE;  Surgeon: Neysa Hotter, MD;  Location: GI PROCEDURES [facility];  Service: Gastroenterology    PR VEIN BYPASS GRAFT,SUBCL-BRACHIAL Left 08/05/2020    Procedure: Bypass Graft, With Vein; Subclavian-Brachial;  Surgeon: Earney Mallet, MD;  Location: MAIN OR [facility];  Service: Vascular    SALPINGECTOMY Left 04/12/2000    Ectopic pregnancy    SALPINGECTOMY Right 11/22/2015    at time of total vaginal hysterectomy    TONSILECTOMY, ADENOIDECTOMY, BILATERAL MYRINGOTOMY AND TUBES      TOTAL VAGINAL HYSTERECTOMY  11/22/2015    With R salpingectomy      Teaching Surgeon Attestation:  IDyann Ruddle, M.D., saw and evaluated the patient. I have reviewed and edited the above note, and I agree with the findings and the plan of care as documented in the NP's note.

## 2023-08-15 NOTE — Unmapped (Signed)
Arteriovenous Graft Procedure Note       Pre-operative Diagnosis:    ESRD on hemodialysis     Post-operative Diagnosis: same.     Operation: Creation of right brachio-axillary arteriovenous arc graft with 5 mm Artegraft ( Bovine Carotid)     Surgeon: Dyann Ruddle, MD      Assistants: An, Lennette Bihari, MD, R4     Anesthesia: IV regional     ASA Class: 4     Procedure Details   The patient was seen in the Holding Room. The risks, benefits, complications, treatment options, and expected outcomes were discussed with the patient. The patient concurred with the proposed plan, giving informed consent.  The site of surgery properly noted/marked. The procedure verified as Creation of arteriovenous graft of the right arm. A Time Out was held and the above information confirmed.     The patient was brought to the Operating Room. The left arm prepped and draped in a sterile fashion. The block was tested and had set up well. An initial skin incision was made vertically  on the very proximal medial right upper arm. The very proximal basilic vein was identified and mobilized for a sufficient distance. It was approximately 6mm and normal in appearance.  No tributaries were divided. The brachial artery was then exposed just above the antecubital fossa.  It was a smallish, 3.5 mm vessel without atherosclerosis. It had a pulse. I had some suspicion that there was a high bifurcation and that there were more than 1 major artery at this level.   A 5 mm Artegraft was opened on to the field and rinsed according to the manufacturer's instructions. It was tunneled as an arc graft between the brachial artery exposure and the axilla. . It flushed nicely with heparinized saline. The patient was then administered 6000 units of heparin IV and it was allowed to circulate for 3 minutes. The arterial anastomosis was done first, end-to-side with running 7-0 Prolene. I narrowed the graft opening with 6-0 prolene to create a 4 mm opening in an attempt to restrict blood flow and avoid creating steal.  It was tested and hemostatic. The venous anastomosis was done with running 6-0 Prolene. The distal end sounded with a 5 mm dilator before completing. The usual flushing maneuvers were employed.  A good thrill was noted in the fistula and the incision was then closed in two layers, subcutaneous tissue with 3-0 Vicryl and skin with 4-0 Monocryl. Steri-Strips were applied.     Findings:   Thrill prtesent at the end of the procedure. Palpable radial pulse at the wrist     Estimate Blood Loss:  20 ml           Drains: none           Total IV Fluids: 100 ml           Specimens: None           Implants: none           Complications:  None; patient tolerated the procedure well.           Disposition: PACU - hemodynamically stable.           Condition: stable     Attending Attestation: I was present and scrubbed for the entire procedure

## 2023-08-16 LAB — PATIENT NEEDLESTICK PACKAGE
HCV RNA: NOT DETECTED
HEPATITIS B SURFACE ANTIGEN: NONREACTIVE
HEPATITIS C ANTIBODY: NONREACTIVE
HIV ANTIGEN/ANTIBODY COMBO: NONREACTIVE

## 2023-08-21 DIAGNOSIS — E039 Hypothyroidism, unspecified: Principal | ICD-10-CM

## 2023-08-21 DIAGNOSIS — E041 Nontoxic single thyroid nodule: Principal | ICD-10-CM

## 2023-08-21 NOTE — Unmapped (Signed)
Called and left VM for patient. She had called and left a message on the VM line asking for an appointment to have her PD cath removed. She had an AVF fistula placed in the OR by Dr Darlin Drop on 08/15/2023. She would like to know when the PD cath can be removed. She currently has an appointment on

## 2023-08-21 NOTE — Unmapped (Signed)
She currently has an appointment with Dr Darlin Drop on 09/05/23 for a post op visit.

## 2023-08-27 ENCOUNTER — Ambulatory Visit: Admit: 2023-08-27 | Payer: MEDICARE | Attending: Clinical | Primary: Clinical

## 2023-08-27 NOTE — Unmapped (Deleted)
Confidential Telehealth Psychology Note  [facility]       Patient Name: Megan Robertson  Medical Record Number: [id number]  Date of Service: August 27, 2023  Attending Psychologist: Colon Branch, PhD   CPT Procedure Code: 81191 for 45 minutes of audio/visual based counseling   Therapy Type: Behavior Modifying/Acceptance and Commitment Therapy (ACT)  Purpose of Treatment: reduce depression symptoms, reduce anxiety symptoms, improve quality of life, improve chronic pain coping    Subjective:   This visit was performed face to face with interactive technology using a HIPPA compliant audio/visual platform. We reviewed confidentiality today. The patient was present in [place], a state in which this provider is licensed and able to provide care (location and contact information confirmed), attended this visit alone, and consented to this virtual pain psychology visit.    [name] is a pleasant 52 y.o.  female with chronic pain and anxiety.  She is being seen via telehealth for this visit. Pt described her mood as *** and presented with slightly anxious and guarded affect. She described  ***    Objective / Mental Status Exam:  Speech/Language:    Normal rate, volume, tone, fluency   Mood:   Depressed and Anxious   Thought process:   Logical, linear, clear, coherent, goal directed   Thought content:    Denies SI, HI, self harm, delusions, obsessions, paranoid ideation, or ideas of reference   Perceptual disturbances:    Denies auditory and visual hallucinations, behavior not concerning for response to internal stimuli   Orientation:  Oriented to person, place, time, and general circumstances   Attention:   Able to fully attend without fluctuations in consciousness   Concentration:   Able to fully concentrate and attend   Memory:   Immediate, short-term, long-term, and recall grossly intact    Fund of knowledge:    Consistent with level of education and development Insight:     Fair   Judgment:    Intact   Impulse Control:   Intact       Assessment:  [name] participated well in this Acceptance and Commitment Therapy (ACT) based session. She was less engaged during today's session and appeared distracated.    Diagnostic Impression:   Anxiety  Persistent depressive disorder      Plan:    Megan Robertson will follow up for teletherapy in approximately one month. Colon Branch, PhD, DBSM  Clinical Pain Psychologist

## 2023-09-04 ENCOUNTER — Ambulatory Visit
Admit: 2023-09-04 | Discharge: 2023-09-05 | Payer: MEDICARE | Attending: Physician Assistant | Primary: Physician Assistant

## 2023-09-06 ENCOUNTER — Ambulatory Visit: Admit: 2023-09-06 | Discharge: 2023-09-07 | Payer: MEDICARE | Attending: "Endocrinology | Primary: "Endocrinology

## 2023-09-06 DIAGNOSIS — E041 Nontoxic single thyroid nodule: Principal | ICD-10-CM

## 2023-09-06 NOTE — Unmapped (Signed)
Assessment/Plan:   Nontoxic multinodular thyroid:    The patient is clinically euthyroid. We discussed that thyroid nodules are common and the majority are benign. The patient has no risk factors for thyroid cancer.  Of note, patient does have history of ovarian cancer treated with chemotherapy in 2000, however, she denies history of head or neck radiation.  I evaluated the thyroid ultrasound images myself and agree that there is a left thyroid nodule meeting criteria for biopsy.    All questions were answered and patient agrees with plan. The assessment and plan were discussed with the patient's family member present at the visit.     If the thyroid biopsy is benign, recommend follow-up ultrasound in 1 year.  If thyroid ultrasound is within the state category 3 or 4, will send for ThyroSeq.  If ultrasound is concerning for cancer, will refer to surgical management.    Follow-up as needed pending biopsy results.      Thank you for referring your patient to our endocrine clinic for evaluation. Please do not hesitate to contact me with any questions.     The patient was seen and discussed with attending physician, Dr. Concepcion Elk.    Bonnita Hollow MD, PGY-5  Fellow in Endocrinology and Metabolism   [employer]    September 06, 2023 7:30 AM    --------------------------------------------------------------------------------------------------------------------------------    Reason for Consult: Evaluate thyroid nodule(s).    Referring Provider: [name] [facility]    Primary Care Provider: Rudie Meyer, MD    Subjective:       Megan Robertson is a 52 y.o. female with history of ESRD on HD, HTN, Asthma who is seen at the request of .[name] [facility] in consultation for evaluation of thyroid nodule(s).  History was obtained from the patient and review of the electronic medical record.     Thyroid nodule noted on previous NM PET scan and other imaging studies.   PMH: ESRD on HD, HTN, Asthma   On ASA and Eliquis for blood clots in fistula   Currenly on HD via central line, has a right arm graft that will start using for HD next week   Also has a PD cathether in place, but was not working so now on HD     Radiation to head or neck? None   Ovarian cancer diagnosed in 2000, treated with surgery and chemotherapy    Family history of thyroid cancer? No known   No family history of cancer known, adopted     Denies anterior neck swelling, neck pain.  + mild dysphagia   + SOB when tucking chin, cannot lie flat on back, feels like breathing gets cut off   + hoarseness every now and then     Past Medical History:   Diagnosis Date    Abnormal mammogram     Abnormal Pap smear of cervix     Allergic     Anemia     Anxiety     Arthritis     Asthma     Back pain     Cancer (CMS-HCC) Ovarian    Depression     Diabetes mellitus (CMS-HCC) Borderline    Eczema     ESRD (end stage renal disease) on dialysis (CMS-HCC)     FSGS (focal segmental glomerulosclerosis) 1997    renal biopsy at [facility]    GERD (gastroesophageal reflux disease)     Gout     H/O adenoidectomy  had adenoids removed    Hypercholesteremia     Hypertension     Keloid     Ovarian cancer (CMS-HCC)     Pancreatitis     Psoriasis     QT prolongation 06/04/2023    Seizure (CMS-HCC)     unknown etiology; none for several years    Sepsis (CMS-HCC) 09/16/2022    Stroke (CMS-HCC) 2000    Tachycardia 07/31/2020         Current Outpatient Medications:     albuterol HFA 90 mcg/actuation inhaler, Inhale 2 puffs every six (6) hours as needed for wheezing., Disp: 8 g, Rfl: 3    apixaban (ELIQUIS) 2.5 mg Tab, Take 1 tablet (2.5 mg total) by mouth two (2) times a day., Disp: 30 tablet, Rfl: 2    aspirin 81 MG chewable tablet, Chew 1 tablet (81 mg total) daily., Disp: 30 tablet, Rfl: 1    beclomethasone dipropionate (QVAR REDIHALER) 80 mcg/actuation inhaler, Inhale 1 puff  in the morning and 1 puff in the evening., Disp: 10.6 g, Rfl: 3    calcitriol (ROCALTROL) 0.5 MCG capsule, Take 1 capsule (0.5 mcg total) by mouth daily., Disp: 30 capsule, Rfl: 2    cinacalcet (SENSIPAR) 60 MG tablet, Take 1 tablet (60 mg total) by mouth daily., Disp: , Rfl:     ferric citrate (AURYXIA ORAL), Take 1 g by mouth Three (3) times a day., Disp: , Rfl:     furosemide (LASIX) 40 MG tablet, Take 1 tablet up to 3 times weekly as needed for leg swelling., Disp: 12 tablet, Rfl: 2    gentamicin (GARAMYCIN) 0.1 % ointment, APPLY TO EXIT SITE DAILY WITH DRESSING CHANGE, Disp: , Rfl:     heparin sodium,porcine (HEPARIN, PORCINE,) 1,000 unit/mL 1000 unit/mL injection, ADMINISTER 1 ML PER 1,000 ML OF PERITONEAL DIALYSIS SOLUTION AS DIRECTED, Disp: 90 mL, Rfl: 0    heparin sodium,porcine/PF (HEP FLUSH-10, PF, IV), Heparin Sodium (Porcine) 1,000 Units/mL Catheter Lock Arterial, Disp: , Rfl:     methoxy peg-epoetin beta (MIRCERA INJ), 100 mcg., Disp: , Rfl:     midodrine (PROAMATINE) 5 MG tablet, Take 1 tablet (5 mg total) by mouth two (2) times a day as needed. (Take 1 tab after PD. Please check your BP each afternoon, take 1 tab for systolic pressure less than 90), Disp: 60 tablet, Rfl: 2    mirtazapine (REMERON) 15 MG tablet, Take 1 tablet (15 mg total) by mouth nightly., Disp: 90 tablet, Rfl: 0    nystatin (MYCOSTATIN) 100,000 unit/gram powder, Apply under breasts two times daily until resolution, Disp: 60 g, Rfl: 1    secukinumab (COSENTYX PEN, 2 PENS,) 150 mg/mL PnIj injection, Inject the contents of 2 pens (300 mg total) under the skin every twenty-eight (28) days. Maintenance dose., Disp: 6 mL, Rfl: 3    senna (SENOKOT) 8.6 mg tablet, Take 2 tablets by mouth daily., Disp: 60 tablet, Rfl: 2    senna-docusate (PERICOLACE) 8.6-50 mg, Take 2 tablets by mouth daily., Disp: 60 tablet, Rfl: 2    sevelamer (RENVELA) 800 mg tablet, Take 3 tablets (2,400 mg total) by mouth Three (3) times a day with a meal., Disp: 270 tablet, Rfl: 11    tizanidine (ZANAFLEX) 2 MG tablet, Take 1 tablet (2 mg total) by mouth daily as needed., Disp: 90 tablet, Rfl: 0    triamcinolone (KENALOG) 0.1 % ointment, Apply topically two (2) times a day as needed. Use on psoriasis until flat., Disp: 80 g, Rfl: 1  Allergies   Allergen Reactions    Cephalexin Itching     unknown; tolerates cefepime    Mango Itching    Peach (Prunus Persica) Itching    Wellbutrin [Bupropion Hcl] Other (See Comments)     Seizure    Egg Itching    Fish Containing Products Itching    Lactase-Rennet Nausea And Vomiting    Lexiscan [Regadenoson] Nausea And Vomiting     Patient had nausea after the Lexi then vomitted [id number]    Mushroom Itching       Family History   Adopted: Yes   Problem Relation Age of Onset    Heart attack Mother 7    Alcohol abuse Mother     Mental illness Mother     No Known Problems Father     No Known Problems Sister     No Known Problems Daughter     No Known Problems Maternal Grandmother     No Known Problems Maternal Grandfather     No Known Problems Paternal Grandmother     No Known Problems Paternal Grandfather     No Known Problems Brother     No Known Problems Son     No Known Problems Maternal Aunt     No Known Problems Maternal Uncle     No Known Problems Paternal Aunt     No Known Problems Paternal Uncle     No Known Problems Other     Anesthesia problems Neg Hx     Broken bones Neg Hx     Cancer Neg Hx     Clotting disorder Neg Hx     Collagen disease Neg Hx     Diabetes Neg Hx     Dislocations Neg Hx     Fibromyalgia Neg Hx     Gout Neg Hx     Hemophilia Neg Hx     Osteoporosis Neg Hx     Rheumatologic disease Neg Hx     Scoliosis Neg Hx     Severe sprains Neg Hx     Sickle cell anemia Neg Hx     Spinal Compression Fracture Neg Hx     Melanoma Neg Hx     Basal cell carcinoma Neg Hx     Squamous cell carcinoma Neg Hx     Breast cancer Neg Hx        Social History     Social History Narrative    on disability since 1994, but does not qualify anymore since she got married and her husband has income.     Lives in [place] with husband, Lisbeth Ply and 2 children       Review of Systems  A 12 point review of systems was otherwise negative except as noted in the HPI.      Objective:      BP 113/81  - Pulse 110  - Wt 80.9 kg (178 lb 6.4 oz)  - LMP  (LMP Unknown)  - BMI 32.63 kg/m??   GEN: appears well  HEENT: EOMI, sclerae anicteric, no proptosis, lid lag, periorbital edema  NECK: supple, thyroid normal size, no palpable nodules, no LAD  CV: rrr  CHEST: Unlabored respiration in room air  ABD: soft, nontender  EXT: no edema, no tenderness along long bones, fistula thrill palpated on right arm  NEURO: no tremor  PSYCH: normal affect.        Lab Review:    TSH (uIU/mL)   Date Value   06/23/2023 1.953  09/15/2022 0.578   09/06/2022 1.737   07/30/2020 1.812   01/14/2017 2.260         Radiology:  Results for orders placed during the hospital encounter of 05/27/23    US Thyroid    Narrative  EXAM: US THYROID  ACCESSION: [id number]    CLINICAL INDICATION: 52 years old with Evaluate for nodule  - E04.1 - Thyroid nodule    COMPARISON: None    TECHNIQUE:  Ultrasound views of the thyroid were obtained using gray scale and limited color Doppler imaging.    FINDINGS:  Thyroid size:  Right thyroid: 4.3 x 1.3 x 1.6 cm  Left thyroid: 5.1 x 2.3 x 2.3 cm  Isthmus: 0.8 cm    Thyroid echotexture: Heterogeneous with multinodular left lobe.    Nodule: 1  Size: 1.1 x 0.7 x 0.8 cm  Location: Right Lower  Composition: mixed cystic and solid (1)  Echogenicity: Isoechoic (1)  Shape: Not taller than wide (0)  Margin: ill defined (0)  Echogenic foci: None (0)    ACR TI-RADS total points: 2  ACR TI-RADS risk category: TI-RADS 2  ACR TI-RADS recommendation: No further follow up needed    Nodule: 2  Size: 2.1 x 2.0 x 2.0 cm  Location: Left Mid lateral  Composition: solid or almost completely solid (2)  Echogenicity: Isoechoic (1)  Shape: Not taller than wide (0)  Margin: Smooth (0)  Echogenic foci: macrocalcifications (1)    ACR TI-RADS total points: 4  ACR TI-RADS risk category: TI-RADS 4  ACR TI-RADS recommendation: Ultrasound guided FNA    Nodule: 3  Size: 1.4 x 1.4 x 1.7 cm  Location: Isthmus  Composition: mixed cystic and solid (1)  Echogenicity: Isoechoic (1)  Shape: Not taller than wide (0)  Margin: Smooth (0)  Echogenic foci: macrocalcifications (1)    ACR TI-RADS total points: 3  ACR TI-RADS risk category: TI-RADS 3  ACR TI-RADS recommendation: Follow up ultrasound in 1 year    A cluster of nodules/confluent nodularity identified along the inferior left thyroid, which are not well visualized due to extension below the level of the clavicles. There are multiple other subcentimeter lesions which are too small to meet TI-RADS follow-up criteria.    Impression  - 2.1 cm TI-RADS 4 nodule in the left mid lobe lateral. Recommend ultrasound-guided FNA.  - 1.7 cm TI-RADS 3 nodule in the isthmus. Recommend follow-up ultrasound in 1 year.  -Cluster of small nodules or confluent nodularity in the inferior left thyroid, which are not well visualized due to extension below the level of the clavicles. Recommend attention on follow-up.        ________________________________  TI-RADS 1 (0 points): Benign- No FNA indication  TI-RADS 2 (2 points): Not suspicious- No FNA indicated  TI-RADS 3 (3 points): Mildly suspicious- FNA is > or = 2.5 cm, follow if > or = 1.5 cm  TI-RADS 4 (4-6 points): Moderately suspicious- FNA if > or = 1.5 or follow if > or = 1.0 cm  TI-RADS 5 (7 or more points): Highly suspicious- FNA if >=1.0 cm, follow if >=0.5 cm  NOTE:  The TI-RADS classification of thyroid nodules has been adopted to standardize risk stratification based on a common lexicon to inform practitioners about which nodules warrant biopsy.  The imaging criteria for TI-RADS criteria and documentation are available online at [email or web address]/

## 2023-09-07 NOTE — Unmapped (Signed)
Thyroid Utrasound guided FNA Procedure Note    Referring Provider: [name] [facility]    Indications: Thyroid Nodule    Anesthesia: Ethyl chloride spray and subcutaneous lidocaine     Procedure Details   I explained the potential benefits and risks of the thyroid fine needle aspiration, including but not limited to infection, bleeding, pain, swelling, non-diagnostic biopsy, and need for repeat biopsy in the future. I explained the potential alternatives. The patient understands these risks, agrees to the procedure, and signed the informed consent form.     Time out performed immediately prior to procedure.    Aseptic technique was used.      Biopsy site: left   Fine Needle Aspiration was performed with a 27 gauge needle x 3 passes  Specimens sent to pathology.   Sample was collected for molecular testing and will be sent if cytology is indeterminate.   Patient was on aspirin and Eliquis at the time of the procedure.  Pressure was held for 15 minutes following the procedure.  No hematoma was identified following the procedure.  May place ice pack over affected area during the first 24 hours.    Findings:  Ultrasound guided FNA of left nodule(s). Representative images are below.     Complications:  None                Bonnita Hollow MD, PGY-5  Fellow in Endocrinology and Metabolism   [employer]  September 06, 2023 5:55 PM

## 2023-09-07 NOTE — Unmapped (Signed)
 Thyroid Fine Needle Aspiration: Post-Procedure Instructions    It was nice seeing you in biopsy clinic today. Below are several commonly asked questions.     Q: How much pain should I expect after my biopsy?   A: Thyroid FNA is generally very well-tolerated. Many patients experience little or not discomfort after the procedure. Sometimes, depending on the number of biopsies performed or the location, you may experience some soreness in the neck for 1-2 days.    Q: How can I address my post-biopsy pain at home?   A: Often no treatment is needed. If the neck is tender, we generally recommend using an ice pack for 10-15 minutes at a time with breaks in between. You may also take acetominophen (Tylenol) the day of the biopsy if needed. We ask that you not take NSAIDs such as ibuprofen (Advil), naproxen (Aleve) or aspirin the day of the procedure. If you still have soreness the following day, you may take these types of medications if you are otherwise approved to do so.     Q: When and how will I find out the results of my biopsy?   A: For routine biopsies, results will not be available for 1-2 weeks following the biopsy. You should hear from the biopsy team or your primary endocrinologist within 2 weeks. If your biopsy had to be sent out to [facility] [place] for special testing, the results may take 2-3 weeks. In this case, it may be 3 weeks before you hear from [place] with final results.     Q: What do I do if it's been over 3 weeks and I haven't heard the results of by biopsy?   A: We first recommend that you check your MyChart application as your physician may have sent you a message with your results. If you and your endocrinologist set up clinic visit within a few weeks of your biopsy, the results will be discussed then. If none of these situations apply, or you do not remember how to log on to the MyChart application, please call our clinic at [phone] for more information.     Q: When should I call for help?   A: You should call for help if any of the following situations apply.   - You have new onset shortness of breath  - You have ongoing bleeding requiring changing multiple bandages  - You have progressive swelling or redness of the neck  - You have fever and chills   You may call our clinic during business hours, but if you feel your situation is emergent, you should call 911.    Q: What should I do if my biopsy result returns as indeterminate?  A. A small sample will be sent for molecular testing. This may take 2-4 weeks to return. We will let you know if the sample will need to be sent. If it does, please call your insurance and give them the information below so they can determine if the test is covered. If covered, please call [facility] and tell them to process the sample. If it is not covered, please call [employer] and discuss a payment plan.   The CPT code is 0026U and the ICD is D44.0

## 2023-09-09 NOTE — Unmapped (Signed)
 I saw and evaluated the patient, participating in the key portions of the service.  I reviewed the fellow's note.  I agree with the fellow's findings and plan. Thompson Grayer, MD

## 2023-09-10 ENCOUNTER — Telehealth: Admit: 2023-09-10 | Discharge: 2023-09-11 | Payer: MEDICARE | Attending: Clinical | Primary: Clinical

## 2023-09-10 DIAGNOSIS — F419 Anxiety disorder, unspecified: Principal | ICD-10-CM

## 2023-09-10 NOTE — Unmapped (Signed)
Confidential Telehealth Psychology Note  [facility]       Patient Name: Megan Robertson  Medical Record Number: [id number]  Date of Service: September 10, 2023  Attending Psychologist: Colon Branch, PhD   CPT Procedure Code: 34742 for 60 minutes of audio/visual based counseling   Therapy Type: Behavior Modifying/Acceptance and Commitment Therapy (ACT)  Purpose of Treatment: reduce depression symptoms, reduce anxiety symptoms, improve quality of life, improve chronic pain coping    Subjective:   This visit was performed face to face with interactive technology using a HIPPA compliant audio/visual platform. We reviewed confidentiality today. The patient was present in [place], a state in which this provider is licensed and able to provide care (location and contact information confirmed), attended this visit alone, and consented to this virtual pain psychology visit.    Ms.  Hlinka is a pleasant 52 y.o.  female with chronic pain and anxiety.  She is being seen via telehealth for this visit. Pt described her mood as fine and presented again with slightly anxious and guarded affect. Assessed patient's goals for working with pain psychology given her lack of engagement. Tracked her experience with still being considered for transplant and associated frustrations. Processed her experience switching to hemodialysis and her hesitation about this and delaying the switch. Coping ahead skills were reviewed in anticipation of her being nervous. Reviewed patient's current sleep and overall satisfaction with her sleep.      Extra time was spent reviewing current support system and ways in which she leans on her children and husband. Also explored patient's lack of engagement in our sessions and provided the option to continue with pain psychology or not. Patient stated that she is not interested in continuing with pain psychology at this time.     Objective / Mental Status Exam:  Speech/Language:    Normal rate, volume, tone, fluency   Mood:   Depressed and Anxious   Thought process:   Logical, linear, clear, coherent, goal directed   Thought content:    Denies SI, HI, self harm, delusions, obsessions, paranoid ideation, or ideas of reference   Perceptual disturbances:    Denies auditory and visual hallucinations, behavior not concerning for response to internal stimuli   Orientation:  Oriented to person, place, time, and general circumstances   Attention:   Able to fully attend without fluctuations in consciousness   Concentration:   Able to fully concentrate and attend   Memory:   Immediate, short-term, long-term, and recall grossly intact    Fund of knowledge:    Consistent with level of education and development   Insight:     Fair   Judgment:    Intact   Impulse Control:   Intact       Assessment:  Ms.  Karpowich participated well in this Acceptance and Commitment Therapy (ACT) based session. She was less engaged during today's session and appeared distracated.    Diagnostic Impression:   Anxiety  Persistent depressive disorder      Plan:    Ms. Whitinger does not wish to continue meeting with pain psychology at this time.    Colon Branch, PhD, DBSM  Clinical Pain Psychologist

## 2023-09-11 NOTE — Unmapped (Signed)
Primary Care Provider:  Rudie Meyer, MD    Subjective: Megan Robertson is a 52 y.o. old female who underwent creation of right brachioaxillary arteriovenous arc graft 110/24/24. She returns to clinic today for routine follow up.  She reports that she is recovering okay however she does have some numbness and tingling in her arm that extends to her hand.  She denies pain of her arm or hand.  She does feel that her hand is slightly cold at times.  She denies loss of strength of her extremity.      Physical Examination:     BP 108/69 (BP Position: Sitting)  - Pulse 98  - Temp 36.2 ??C (97.2 ??F) (Temporal)  - Wt 81.8 kg (180 lb 4.8 oz)  - LMP  (LMP Unknown)  - SpO2 100%  - BMI 32.98 kg/m??     Extremities: Right upper extremity with skin Slee/D/I.  Graft is prominent and bruit is appreciated as well as thrill.  Capillary refill of right digits <3 seconds.  4-5/5 strength.  Hand slightly cooler than other hand.   Pulses: Radial pulse not palpable    Assessment/Plan:  Megan Robertson is a 52 y.o. old female who underwent creation of right brachioaxillary arteriovenous arc graft 110/24/24.   Possibility of slight steel syndrome, but hand is fully usable, no pain, appropriate refill.  Discussed with surgical attending.  Recommends utilizing the graft and this should normalize.  Advised of alarming symptoms to prompt an immediate return.  Note given.  Follow up with [facility] in 3 weeks.     Trena Platt, PA-C  General Surgery  09/11/2023  8:28 AM

## 2023-09-13 DIAGNOSIS — T8571XA Infection and inflammatory reaction due to peritoneal dialysis catheter, initial encounter: Principal | ICD-10-CM

## 2023-09-13 MED ORDER — DOXYCYCLINE HYCLATE 100 MG CAPSULE
ORAL_CAPSULE | Freq: Two times a day (BID) | ORAL | 0 refills | 14 days | Status: CP
Start: 2023-09-13 — End: 2023-09-27

## 2023-09-14 NOTE — Unmapped (Signed)
Rx

## 2023-09-18 DIAGNOSIS — L988 Other specified disorders of the skin and subcutaneous tissue: Principal | ICD-10-CM

## 2023-09-25 DIAGNOSIS — N186 End stage renal disease: Principal | ICD-10-CM

## 2023-09-25 NOTE — Unmapped (Signed)
VIR pre procedure prep call completed. Reviewed to arrive 1 hour prior to appointment time 0900. COVID screening completed. Pt/family aware of need for driver >52 years of age. Pt/family verbalized understanding and aware of our address. All questions answered.

## 2023-09-27 ENCOUNTER — Ambulatory Visit: Admit: 2023-09-27 | Discharge: 2023-09-27

## 2023-10-01 ENCOUNTER — Ambulatory Visit: Admit: 2023-10-01 | Discharge: 2023-10-02 | Payer: MEDICARE

## 2023-10-01 MED ADMIN — midazolam (VERSED) injection: INTRAVENOUS | @ 14:00:00 | Stop: 2023-10-01

## 2023-10-01 MED ADMIN — vancomycin (VANCOCIN) injection: INTRAVENOUS | @ 14:00:00 | Stop: 2023-10-01

## 2023-10-01 MED ADMIN — fentaNYL (PF) (SUBLIMAZE) injection: INTRAVENOUS | @ 14:00:00 | Stop: 2023-10-01

## 2023-10-01 MED ADMIN — iodixanol (VISIPAQUE) 320 mg iodine/mL solution 100 mL: 100 mL | INTRAVENOUS | @ 15:00:00 | Stop: 2023-10-01

## 2023-10-01 MED ADMIN — sodium chloride (NS) 0.9 % infusion: INTRAVENOUS | @ 15:00:00 | Stop: 2023-10-01

## 2023-10-01 MED ADMIN — lidocaine-EPINEPHrine (XYLOCAINE W/EPI) 1 %-1:100,000 injection: PERINEURAL | @ 14:00:00 | Stop: 2023-10-01

## 2023-10-01 MED ADMIN — diphenhydrAMINE (BENADRYL) capsule 50 mg: 50 mg | ORAL | @ 16:00:00 | Stop: 2023-10-01

## 2023-10-01 NOTE — Unmapped (Signed)
 Dressing clean, dry, and intact.  Discharge instructions reviewed and given to pt and family.  Understanding verbalized.   Home supplies provided.  Pt ambulatory without assistance.  Discharged from PRU in stable condition.

## 2023-10-01 NOTE — Unmapped (Signed)
[facility] - Operative Note     Post-Procedure Note    Procedure Name: Right internal jugular vein tunneled hemodialysis catheter removal    Pre-Op Diagnosis: ESRD    Post-Op Diagnosis: Same as pre-operative diagnosis    Attending Physician:  Gaylyn Lambert, MD    Time out: Prior to the procedure, a time out was performed with all team members present. During the time out, the patient, procedure and procedure site when applicable were verbally verified by the team members and Gaylyn Lambert, MD.    Description of procedure: Successful removal of right internal jugular vein tunneled hemodialysis catheter using blunt dissection and traction under local anesthesia.    Plan:   Okay to remove dressing in 2-3 days.  Monitor for bleeding and hold pressure on right base of neck if any bleeding noted.      Sedation:None    Estimated Blood Loss: approximately <5 mL  Complications: None    See detailed procedure note with images in PACS [facility]).    The patient tolerated the procedure well without incident or complication and left the room in stable condition.    Gaylyn Lambert, MD  10/01/2023 10:12 AM

## 2023-10-01 NOTE — Unmapped (Signed)
[facility]- Pre Procedure H/P  Patient name: Megan Robertson  CSN: [id number]  MRN: [id number]  Date of Procedure: 10/01/2023    Assessment/Plan:    Ms. Jeraldine Loots is a 52 y.o. female who will undergo peritoneal dialysis catheter removal and tunneled hemodialysis catheter removal under moderate sedation in Interventional nephrology at [facility] endovascular center.    Consent obtained in the Pre Op holding area by Dr. Tiffany Kocher.  Risks, benefits, and alternatives including but not limited to risks like bleeding, infection, bowel perforation, air embolism, thromboembolism, pain, vascular injury were discussed with patient/patient's representative. All questions were answered to patient/patient's representative satisfaction.  Patient/Patient's representative consents and would like to proceed with the procedure.   --The patient will accept blood products in an emergent situation.  --The patient does not have a Do Not Resuscitate order in effect.      HPI: Ms. Jeraldine Loots is a 52 y.o. female with ESRD on intermittent hemodialysis via a right upper arm AV graft comes in for a peritoneal dialysis catheter removal and right internal jugular vein tunneled hemodialysis catheter removal.  Patient denies any other complaints today.    Past Medical History:   Diagnosis Date    Abnormal mammogram     Abnormal Pap smear of cervix     Allergic     Anemia     Anxiety     Arthritis     Asthma     Back pain     Cancer (CMS-HCC) Ovarian    Depression     Diabetes mellitus (CMS-HCC) Borderline    Eczema     ESRD (end stage renal disease) on dialysis (CMS-HCC)     FSGS (focal segmental glomerulosclerosis) 1997    renal biopsy at [facility]    GERD (gastroesophageal reflux disease)     Gout     H/O adenoidectomy     had adenoids removed    Hypercholesteremia     Hypertension     Keloid     Ovarian cancer (CMS-HCC)     Pancreatitis     Psoriasis     QT prolongation 06/04/2023    Seizure (CMS-HCC)     unknown etiology; none for several years    Sepsis (CMS-HCC) 09/16/2022    Stroke (CMS-HCC) 2000    Tachycardia 07/31/2020       Past Surgical History:   Procedure Laterality Date    APPENDECTOMY      BREAST EXCISIONAL BIOPSY Right 11/28/2016    CHEMOTHERAPY      for ovarian CA in 2000    CHG US GUIDE, VASCULAR ACCESS N/A 01/29/2020    Procedure: ULTRASOUND GUIDANCE FOR VASC ACCESS REQUIRING [place] EVAL OF POTENTIAL ACCESS SITES;  Surgeon: Leona Carry, MD;  Location: MAIN OR [facility];  Service: Transplant    CHG US GUIDE, VASCULAR ACCESS N/A 06/08/2020    Procedure: ULTRASOUND GUIDANCE FOR VASC ACCESS REQUIRING [place] EVAL OF POTENTIAL ACCESS SITES;  Surgeon: Leona Carry, MD;  Location: MAIN OR [facility];  Service: Transplant    CHOLECYSTECTOMY      COMBINED HYSTEROSCOPY DIAGNOSTIC / D&C  07/07/2015    Planned for endometrial ablation, but patient had uterine perforation after D&C and unable to perform    HYSTERECTOMY      LEFT OOPHORECTOMY Left 04/12/2000    Removed with L fallopian tube for ectopic pregnancy    OOPHORECTOMY      PR ANASTOMOSIS,AV,ANY SITE Right 08/15/2023    Procedure: ARTERIOVENOUS ANASTOMOSIS, OPEN; DIRECT, ANY SITE, UPPER  EXTREMITY;  Surgeon: Dyann Ruddle, MD;  Location: [facility] OR [facility];  Service: General Surgery    PR COLONOSCOPY W/BIOPSY SINGLE/MULTIPLE  09/12/2021    Procedure: COLONOSCOPY, FLEXIBLE, PROXIMAL TO SPLENIC FLEXURE; WITH BIOPSY, SINGLE OR MULTIPLE;  Surgeon: Maris Berger, MD;  Location: GI PROCEDURES [facility];  Service: Gastroenterology    PR COLSC FLX W/RMVL OF TUMOR POLYP LESION SNARE TQ N/A 09/12/2021    Procedure: COLONOSCOPY FLEX; W/REMOV TUMOR/LES BY SNARE;  Surgeon: Maris Berger, MD;  Location: [facility]URES [facility];  Service: Gastroenterology    PR CREAT AV FISTULA,AUTOGENOUS GRAFT Left 01/29/2020    Procedure: Create Av Fistula (Separt Proc); Autog Gft;  Surgeon: Leona Carry, MD;  Location: MAIN OR [facility];  Service: Transplant    PR CREAT AV FISTULA,NON-AUTOGENOUS GRAFT Left 06/08/2020    Procedure: CREATE AV FISTULA (SEPARATE PROC); NONAUTOGENOUS GRAFT (EG, BIOLOGICAL COLLAGEN, THERMOPLASTIC GRAFT);  Surgeon: Leona Carry, MD;  Location: MAIN OR [facility];  Service: Transplant    PR EXPLORATION N/FLWD SURG UPPER EXTREMITY ARTERY Left 08/02/2020    Procedure: Exploration Not Followed By Surgical Repair, Artery; Upper Extremity (Eg, Axillary, Brachial, Radial, Ulnar);  Surgeon: Earney Mallet, MD;  Location: MAIN OR [facility];  Service: Vascular    PR INCIS/DRAIN ARM,DEEP ABSC/HEMATOMA Left 08/14/2020    Procedure: INCISION AND DRAINAGE, UPPER ARM OR ELBOW AREA; DEEP ABSCESS OR HEMATOMA;  Surgeon: Earney Mallet, MD;  Location: MAIN OR [place];  Service: Vascular    PR INSERTION TUNNEL INTRAPERITONEAL CATH DIAL OPEN Midline 02/01/2021    Procedure: INSERTION OF INTRAPERITONEAL CANNULA OR CATHETER FOR DRAINAGE OR DIALYSIS; PERMANENT;  Surgeon: Loney Hering, MD;  Location: MAIN OR [place];  Service: Transplant    PR REMV ART CLOT AXILL-BRACH,ARM INCIS Left 08/02/2020    Procedure: Embolect/Thrombec; Axilry/Brachial Art-Arm Incs;  Surgeon: Earney Mallet, MD;  Location: MAIN OR [facility];  Service: Vascular    PR UPPER GI ENDOSCOPY,BIOPSY N/A 08/29/2017    Procedure: UGI ENDOSCOPY; WITH BIOPSY, SINGLE OR MULTIPLE;  Surgeon: Neysa Hotter, MD;  Location: [facility];  Service: Gastroenterology    PR VEIN BYPASS GRAFT,SUBCL-BRACHIAL Left 08/05/2020    Procedure: Bypass Graft, With Vein; Subclavian-Brachial;  Surgeon: Earney Mallet, MD;  Location: MAIN OR [facility];  Service: Vascular    SALPINGECTOMY Left 04/12/2000    Ectopic pregnancy    SALPINGECTOMY Right 11/22/2015    at time of total vaginal hysterectomy    TONSILECTOMY, ADENOIDECTOMY, BILATERAL MYRINGOTOMY AND TUBES      TOTAL VAGINAL HYSTERECTOMY  11/22/2015    With R salpingectomy        Allergies:   Allergies   Allergen Reactions    Cephalexin Itching     unknown; tolerates cefepime    Mango Itching    Peach (Prunus Persica) Itching    Wellbutrin [Bupropion Hcl] Other (See Comments)     Seizure    Egg Itching    Fish Containing Products Itching    Lactase-Rennet Nausea And Vomiting    Lexiscan [Regadenoson] Nausea And Vomiting     Patient had nausea after the Lexi then vomitted [id number]    Mushroom Itching       Medications:  No relevant medications, please see full medication list in Epic.    ASA Grade: ASA 3 - Patient with moderate systemic disease with functional limitations    PE:    Vitals:    10/01/23 0828   Pulse: 101   Temp: 36.6 ??C (97.9 ??F)  SpO2: 100%     General: female in NAD.  Airway assessment: Class 2 - Can visualize soft palate and fauces, tip of uvula is obscured  Lungs: Respirations nonlabored  Abdomen: Left lower quadrant peritoneal dialysis catheter in place.  Right internal jugular vein tunneled hemodialysis catheter in place.  Right upper arm brachiobasilic AV graft with positive thrill and bruit.

## 2023-10-01 NOTE — Unmapped (Addendum)
[facility] - Operative Note     Post-Procedure Note    Procedure Name: Peritoneal dialysis catheter removal    Pre-Op Diagnosis: ESRD    Post-Op Diagnosis: Same as pre-operative diagnosis    Attending Physician:  Gaylyn Lambert, MD    Time out: Prior to the procedure, a time out was performed with all team members present. During the time out, the patient, procedure and procedure site when applicable were verbally verified by the team members and Gaylyn Lambert, MD.    Description of procedure: Successful removal of left lower quadrant peritoneal dialysis catheter with left transverse abdominis plane block for regional anesthesia.    Plan:   Okay to remove dressing in 7-10 days if no soakage of dressing.     Sedation:Moderate sedation    Estimated Blood Loss: approximately <5 mL  Complications: None    See detailed procedure note with images in PACS [facility]).    The patient tolerated the procedure well without incident or complication and left the room in stable condition.    Gaylyn Lambert, MD  10/01/2023 10:11 AM

## 2023-10-02 ENCOUNTER — Ambulatory Visit
Admit: 2023-10-02 | Discharge: 2023-10-09 | Disposition: A | Discharge status: Home / Self Care | Payer: MEDICARE | Admitting: Student in an Organized Health Care Education/Training Program

## 2023-10-02 ENCOUNTER — Ambulatory Visit: Admit: 2023-10-02 | Discharge: 2023-10-09 | Payer: MEDICARE

## 2023-10-02 ENCOUNTER — Ambulatory Visit: Admit: 2023-10-02 | Payer: MEDICARE

## 2023-10-02 DIAGNOSIS — N186 End stage renal disease: Principal | ICD-10-CM

## 2023-10-02 LAB — CBC W/ AUTO DIFF
BASOPHILS ABSOLUTE COUNT: 0.1 10*9/L (ref 0.0–0.1)
BASOPHILS RELATIVE PERCENT: 0.9 %
EOSINOPHILS ABSOLUTE COUNT: 0.9 10*9/L — ABNORMAL HIGH (ref 0.0–0.5)
EOSINOPHILS RELATIVE PERCENT: 8.1 %
HEMATOCRIT: 37.2 % (ref 34.0–44.0)
HEMOGLOBIN: 11.6 g/dL (ref 11.3–14.9)
LYMPHOCYTES ABSOLUTE COUNT: 2.2 10*9/L (ref 1.1–3.6)
LYMPHOCYTES RELATIVE PERCENT: 20.4 %
MEAN CORPUSCULAR HEMOGLOBIN CONC: 31.2 g/dL — ABNORMAL LOW (ref 32.0–36.0)
MEAN CORPUSCULAR HEMOGLOBIN: 24.7 pg — ABNORMAL LOW (ref 25.9–32.4)
MEAN CORPUSCULAR VOLUME: 79.3 fL (ref 77.6–95.7)
MEAN PLATELET VOLUME: 8 fL (ref 6.8–10.7)
MONOCYTES ABSOLUTE COUNT: 0.4 10*9/L (ref 0.3–0.8)
MONOCYTES RELATIVE PERCENT: 3.9 %
NEUTROPHILS ABSOLUTE COUNT: 7.2 10*9/L (ref 1.8–7.8)
NEUTROPHILS RELATIVE PERCENT: 66.7 %
NUCLEATED RED BLOOD CELLS: 0 /100{WBCs} (ref <=4)
PLATELET COUNT: 216 10*9/L (ref 150–450)
RED BLOOD CELL COUNT: 4.69 10*12/L (ref 3.95–5.13)
RED CELL DISTRIBUTION WIDTH: 20 % — ABNORMAL HIGH (ref 12.2–15.2)
WBC ADJUSTED: 10.8 10*9/L (ref 3.6–11.2)

## 2023-10-02 LAB — COMPREHENSIVE METABOLIC PANEL
ALBUMIN: 3.2 g/dL — ABNORMAL LOW (ref 3.4–5.0)
ALKALINE PHOSPHATASE: 95 U/L (ref 46–116)
ALT (SGPT): 17 U/L (ref 10–49)
ANION GAP: 14 mmol/L (ref 5–14)
AST (SGOT): 31 U/L (ref <=34)
BILIRUBIN TOTAL: 0.2 mg/dL — ABNORMAL LOW (ref 0.3–1.2)
BLOOD UREA NITROGEN: 35 mg/dL — ABNORMAL HIGH (ref 9–23)
BUN / CREAT RATIO: 4
CALCIUM: 8.8 mg/dL (ref 8.7–10.4)
CHLORIDE: 103 mmol/L (ref 98–107)
CO2: 23.6 mmol/L (ref 20.0–31.0)
CREATININE: 7.98 mg/dL — ABNORMAL HIGH (ref 0.55–1.02)
EGFR CKD-EPI (2021) FEMALE: 6 mL/min/{1.73_m2} — ABNORMAL LOW (ref >=60)
GLUCOSE RANDOM: 90 mg/dL (ref 70–179)
POTASSIUM: 4.4 mmol/L (ref 3.4–4.8)
PROTEIN TOTAL: 7 g/dL (ref 5.7–8.2)
SODIUM: 141 mmol/L (ref 135–145)

## 2023-10-02 LAB — PROTIME-INR
INR: 1
PROTIME: 11.4 s (ref 9.9–12.6)

## 2023-10-02 LAB — LACTATE SEPSIS, VENOUS: LACTATE BLOOD VENOUS: 1.1 mmol/L (ref 0.5–1.8)

## 2023-10-02 MED ADMIN — cefTRIAXone (ROCEPHIN) 2 g in sodium chloride 0.9 % (NS) 100 mL IVPB-MBP: 2 g | INTRAVENOUS | @ 21:00:00 | Stop: 2023-10-02

## 2023-10-02 MED ADMIN — oxyCODONE (ROXICODONE) immediate release tablet 5 mg: 5 mg | ORAL | @ 23:00:00 | Stop: 2023-10-02

## 2023-10-02 NOTE — Unmapped (Signed)
12/11 327pm  Left message on Pt's voicemail to feel free to call back with any concerns or questions.  DVB

## 2023-10-02 NOTE — Unmapped (Signed)
Pt stating she went for dialysis today and the people at the place noted the area hot and swollen. Pt's right arm is hot to touch and red. Pt denying any fevers at home. Pt just had her PD port removed from her abdomen. Last dialysis on Monday.     Husband on phone and stating patient was sent to admitted.

## 2023-10-02 NOTE — Unmapped (Signed)
Nephrology    Patient arrived at dialysis today with new onset erythema overlying the right arteriovenous graft.  The central venous catheter was just removed yesterday as well as her peritoneal dialysis catheter.  Because of concern of cellulitis, the graft cannot be accessed today because of the risk of bacteremia and endocarditis.  Therefore, she is referred to the [facility] emergency department for placement of a temporary dialysis catheter, hemodialysis, and IV antibiotics.  She would also benefit from consultation with Dr. Darlin Drop who placed the graft.    Megan Robertson. Stefano Gaul, MD  Date of service 10/02/2023

## 2023-10-03 LAB — CBC
HEMATOCRIT: 34.3 % (ref 34.0–44.0)
HEMOGLOBIN: 10.5 g/dL — ABNORMAL LOW (ref 11.3–14.9)
MEAN CORPUSCULAR HEMOGLOBIN CONC: 30.5 g/dL — ABNORMAL LOW (ref 32.0–36.0)
MEAN CORPUSCULAR HEMOGLOBIN: 24.6 pg — ABNORMAL LOW (ref 25.9–32.4)
MEAN CORPUSCULAR VOLUME: 80.7 fL (ref 77.6–95.7)
MEAN PLATELET VOLUME: 7.8 fL (ref 6.8–10.7)
PLATELET COUNT: 192 10*9/L (ref 150–450)
RED BLOOD CELL COUNT: 4.25 10*12/L (ref 3.95–5.13)
RED CELL DISTRIBUTION WIDTH: 20 % — ABNORMAL HIGH (ref 12.2–15.2)
WBC ADJUSTED: 16.4 10*9/L — ABNORMAL HIGH (ref 3.6–11.2)

## 2023-10-03 LAB — BASIC METABOLIC PANEL
ANION GAP: 18 mmol/L — ABNORMAL HIGH (ref 5–14)
BLOOD UREA NITROGEN: 35 mg/dL — ABNORMAL HIGH (ref 9–23)
BUN / CREAT RATIO: 4
CALCIUM: 8.1 mg/dL — ABNORMAL LOW (ref 8.7–10.4)
CHLORIDE: 103 mmol/L (ref 98–107)
CO2: 18.8 mmol/L — ABNORMAL LOW (ref 20.0–31.0)
CREATININE: 8.82 mg/dL — ABNORMAL HIGH (ref 0.55–1.02)
EGFR CKD-EPI (2021) FEMALE: 5 mL/min/{1.73_m2} — ABNORMAL LOW (ref >=60)
GLUCOSE RANDOM: 98 mg/dL (ref 70–179)
POTASSIUM: 4.5 mmol/L (ref 3.5–5.1)
SODIUM: 140 mmol/L (ref 135–145)

## 2023-10-03 LAB — PHOSPHORUS: PHOSPHORUS: 6.1 mg/dL — ABNORMAL HIGH (ref 2.4–5.1)

## 2023-10-03 LAB — MAGNESIUM: MAGNESIUM: 1.6 mg/dL (ref 1.6–2.6)

## 2023-10-03 MED ADMIN — nystatin (MYCOSTATIN) powder: TOPICAL | @ 15:00:00

## 2023-10-03 MED ADMIN — calcitriol (ROCALTROL) capsule 0.5 mcg: .5 ug | ORAL | @ 14:00:00

## 2023-10-03 MED ADMIN — gentamicin-sodium citrate lock solution in NS: 1.4 mL | @ 21:00:00

## 2023-10-03 MED ADMIN — fentaNYL (PF) (SUBLIMAZE) injection: INTRAVENOUS | @ 20:00:00 | Stop: 2023-10-03

## 2023-10-03 MED ADMIN — piperacillin-tazobactam (ZOSYN) 3.375 g in sodium chloride 0.9 % (NS) 100 mL IVPB-MBP: 3.375 g | INTRAVENOUS | Stop: 2023-10-02

## 2023-10-03 MED ADMIN — fluticasone furoate (ARNUITY ELLIPTA) 100 mcg/actuation inhaler 1 puff: 1 | RESPIRATORY_TRACT | @ 13:00:00

## 2023-10-03 MED ADMIN — lactated ringers bolus 1,000 mL: 1000 mL | INTRAVENOUS | @ 16:00:00 | Stop: 2023-10-03

## 2023-10-03 MED ADMIN — acetaminophen (TYLENOL) tablet 650 mg: 650 mg | ORAL | @ 23:00:00

## 2023-10-03 MED ADMIN — heparin, porcine (PF) 100 unit/mL injection: INTRAVENOUS | @ 20:00:00 | Stop: 2023-10-03

## 2023-10-03 MED ADMIN — heparin (porcine) 1000 unit/mL injection: INTRAVENOUS | @ 20:00:00 | Stop: 2023-10-03

## 2023-10-03 MED ADMIN — cinacalcet (SENSIPAR) tablet 60 mg: 60 mg | ORAL | @ 14:00:00

## 2023-10-03 MED ADMIN — ondansetron (ZOFRAN-ODT) disintegrating tablet 4 mg: 4 mg | ORAL | @ 18:00:00 | Stop: 2023-10-03

## 2023-10-03 MED ADMIN — vancomycin (VANCOCIN) 2000 mg in sodium chloride (NS) 0.9% 500 mL IVPB: 2000 mg | INTRAVENOUS | @ 01:00:00 | Stop: 2023-10-02

## 2023-10-03 MED ADMIN — ondansetron (ZOFRAN) injection 4 mg: 4 mg | INTRAVENOUS | @ 05:00:00 | Stop: 2023-10-02

## 2023-10-03 MED ADMIN — apixaban (ELIQUIS) tablet 2.5 mg: 2.5 mg | ORAL | @ 14:00:00

## 2023-10-03 MED ADMIN — sevelamer (RENVELA) tablet 2,400 mg: 2400 mg | ORAL | @ 23:00:00

## 2023-10-03 MED ADMIN — cefTRIAXone (ROCEPHIN) 1 g in sodium chloride 0.9 % (NS) 100 mL IVPB-MBP: 1 g | INTRAVENOUS | @ 11:00:00 | Stop: 2023-10-07

## 2023-10-03 MED ADMIN — hydrOXYzine (ATARAX) tablet 25 mg: 25 mg | ORAL | @ 03:00:00

## 2023-10-03 MED ADMIN — midazolam (VERSED) injection 2 mg: 2 mg | INTRAVENOUS | @ 19:00:00 | Stop: 2023-10-03

## 2023-10-03 MED ADMIN — aspirin chewable tablet 81 mg: 81 mg | ORAL | @ 14:00:00

## 2023-10-03 MED ADMIN — lidocaine (XYLOCAINE) 10 mg/mL (1 %) injection: SUBCUTANEOUS | @ 20:00:00 | Stop: 2023-10-03

## 2023-10-03 NOTE — Unmapped (Signed)
[facility]  Emergency Department Provider Note      ED Clinical Impression      Final diagnoses:   Complication of AV dialysis fistula, initial encounter (Primary)   Cellulitis, unspecified cellulitis site            Impression, Medical Decision Making, Progress Notes and Critical Care      Impression, Differential Diagnosis and Plan of Care    This is a afebrile, nontoxic-appearing 52 year old female presenting for evaluation of her right upper arm dialysis fistula has overlying erythema and warmth and was not accessed during her dialysis visit earlier today due to this.  Patient has history significant for recent removal of her left lower abdomen peritoneal dialysis catheter as well as a tunneled port catheter to her right clavicular region.  On exam patient has warmth and mild erythema with firmness of the underlying fistula on her right upper arm, neurolysed abdominal tenderness with warmth and erythema surrounding the left lower abdominal wall port removal site.  The right upper arm fistula has appropriate thrill.    ED Course as of 10/03/23 0849   Wed Oct 02, 2023   1537 Blood Culture #2   1538 Blood Culture #1   1759 Spoke with on-call surgery attending who was agreeable with admitting patient to medicine team if there are no surgical findings on CT and pelvis.  Patient's potassium is 4.4 and does not require emergent dialysis at this time.  Will plan on medicine admission with surgery following tomorrow for intervention to be able to perform dialysis.         Portions of this record have been created using Scientist, clinical (histocompatibility and immunogenetics). Dictation errors have been sought, but may not have been identified and corrected.    See chart and resident provider documentation for details.    ____________________________________________         History        Reason for Visit  Dialysis Shunt Problem        HPI   Megan Robertson is a 52 y.o. female with past medical history as listed below present for evaluation of swelling and erythema overlying her right upper arm arteriovenous fistula, generalized abdominal pain,      External Records Reviewed  (Inpatient/Outpatient notes, Prior labs/imaging studies, Care Everywhere, PDMP, External ED notes, etc)    Reviewed previous notes and labs, imaging, Care Everywhere.    Past Medical History:   Diagnosis Date    Abnormal mammogram     Abnormal Pap smear of cervix     Allergic     Anemia     Anxiety     Arthritis     Asthma     Back pain     Cancer (CMS-HCC) Ovarian    Depression     Eczema     ESRD (end stage renal disease) on dialysis (CMS-HCC)     FSGS (focal segmental glomerulosclerosis) 1997    renal biopsy at [facility]    GERD (gastroesophageal reflux disease)     Gout     H/O adenoidectomy     had adenoids removed    Hypercholesteremia     Hypertension     Keloid     Ovarian cancer (CMS-HCC)     Pancreatitis     Psoriasis     QT prolongation 06/04/2023    Seizure (CMS-HCC)     unknown etiology; none for several years    Sepsis (CMS-HCC) 09/16/2022    Stroke (  CMS-HCC) 2000    Tachycardia 07/31/2020       Patient Active Problem List   Diagnosis    Gout    GERD (gastroesophageal reflux disease)    Hypertension    Ductal hyperplasia of breast    PTSD (post-traumatic stress disorder)    Prediabetes    Mild persistent asthma without complication    Dyspnea on exertion    Stable angina (CMS-HCC)    OSA (obstructive sleep apnea)    Thrombocytopenia (CMS-HCC)    Anemia in chronic kidney disease    Coagulation defect, unspecified (CMS-HCC)    Colitis    ESRD (end stage renal disease) on dialysis (CMS-HCC)    Metabolic acidosis, NAG, bicarbonate losses    Obesity    Secondary hyperparathyroidism of renal origin (CMS-HCC)    Stenosis of left brachial artery (CMS-HCC)    QT prolongation    Dysgeusia    Cellulitis       Past Surgical History:   Procedure Laterality Date    APPENDECTOMY      BREAST EXCISIONAL BIOPSY Right 11/28/2016    CHEMOTHERAPY      for ovarian CA in 2000 CHG US GUIDE, VASCULAR ACCESS N/A 01/29/2020    Procedure: ULTRASOUND GUIDANCE FOR VASC ACCESS REQUIRING [place] EVAL OF POTENTIAL ACCESS SITES;  Surgeon: Leona Carry, MD;  Location: MAIN OR [facility];  Service: Transplant    CHG US GUIDE, VASCULAR ACCESS N/A 06/08/2020    Procedure: ULTRASOUND GUIDANCE FOR VASC ACCESS REQUIRING [place] EVAL OF POTENTIAL ACCESS SITES;  Surgeon: Leona Carry, MD;  Location: MAIN OR [facility];  Service: Transplant    CHOLECYSTECTOMY      COMBINED HYSTEROSCOPY DIAGNOSTIC / D&C  07/07/2015    Planned for endometrial ablation, but patient had uterine perforation after D&C and unable to perform    HYSTERECTOMY      LEFT OOPHORECTOMY Left 04/12/2000    Removed with L fallopian tube for ectopic pregnancy    OOPHORECTOMY      PR ANASTOMOSIS,AV,ANY SITE Right 08/15/2023    Procedure: ARTERIOVENOUS ANASTOMOSIS, OPEN; DIRECT, ANY SITE, UPPER EXTREMITY;  Surgeon: Dyann Ruddle, MD;  Location: [facility] OR [facility];  Service: General Surgery    PR COLONOSCOPY W/BIOPSY SINGLE/MULTIPLE  09/12/2021    Procedure: COLONOSCOPY, FLEXIBLE, PROXIMAL TO SPLENIC FLEXURE; WITH BIOPSY, SINGLE OR MULTIPLE;  Surgeon: Maris Berger, MD;  Location: GI PROCEDURES [facility];  Service: Gastroenterology    PR COLSC FLX W/RMVL OF TUMOR POLYP LESION SNARE TQ N/A 09/12/2021    Procedure: COLONOSCOPY FLEX; W/REMOV TUMOR/LES BY SNARE;  Surgeon: Maris Berger, MD;  Location: GI PROCEDURES [facility];  Service: Gastroenterology    PR CREAT AV FISTULA,AUTOGENOUS GRAFT Left 01/29/2020    Procedure: Create Av Fistula (Separt Proc); Autog Gft;  Surgeon: Leona Carry, MD;  Location: MAIN OR [facility];  Service: Transplant    PR CREAT AV FISTULA,NON-AUTOGENOUS GRAFT Left 06/08/2020    Procedure: CREATE AV FISTULA (SEPARATE PROC); NONAUTOGENOUS GRAFT (EG, BIOLOGICAL COLLAGEN, THERMOPLASTIC GRAFT);  Surgeon: Leona Carry, MD;  Location: MAIN OR [facility];  Service: Transplant    PR EXPLORATION N/FLWD SURG UPPER EXTREMITY ARTERY Left 08/02/2020    Procedure: Exploration Not Followed By Surgical Repair, Artery; Upper Extremity (Eg, Axillary, Brachial, Radial, Ulnar);  Surgeon: Earney Mallet, MD;  Location: MAIN OR [facility];  Service: Vascular    PR INCIS/DRAIN ARM,DEEP ABSC/HEMATOMA Left 08/14/2020    Procedure: INCISION AND DRAINAGE, UPPER ARM OR ELBOW AREA; DEEP ABSCESS OR HEMATOMA;  Surgeon: Earney Mallet, MD;  Location: MAIN OR [facility];  Service: Vascular    PR INSERTION TUNNEL INTRAPERITONEAL CATH DIAL OPEN Midline 02/01/2021    Procedure: INSERTION OF INTRAPERITONEAL CANNULA OR CATHETER FOR DRAINAGE OR DIALYSIS; PERMANENT;  Surgeon: Loney Hering, MD;  Location: MAIN OR [facility];  Service: Transplant    PR REMV ART CLOT AXILL-BRACH,ARM INCIS Left 08/02/2020    Procedure: Embolect/Thrombec; Axilry/Brachial Art-Arm Incs;  Surgeon: Earney Mallet, MD;  Location: MAIN OR [facility];  Service: Vascular    PR UPPER GI ENDOSCOPY,BIOPSY N/A 08/29/2017    Procedure: UGI ENDOSCOPY; WITH BIOPSY, SINGLE OR MULTIPLE;  Surgeon: Neysa Hotter, MD;  [facility] [facility];  Service: Gastroenterology    PR VEIN BYPASS GRAFT,SUBCL-BRACHIAL Left 08/05/2020    Procedure: Bypass Graft, With Vein; Subclavian-Brachial;  Surgeon: Earney Mallet, MD;  Location: MAIN OR [facility];  Service: Vascular    SALPINGECTOMY Left 04/12/2000    Ectopic pregnancy    SALPINGECTOMY Right 11/22/2015    at time of total vaginal hysterectomy    TONSILECTOMY, ADENOIDECTOMY, BILATERAL MYRINGOTOMY AND TUBES      TOTAL VAGINAL HYSTERECTOMY  11/22/2015    With R salpingectomy         Current Facility-Administered Medications:     acetaminophen (TYLENOL) tablet 650 mg, 650 mg, Oral, Q4H PRN, Lad, Saloni U, MD    albuterol (PROVENTIL HFA;VENTOLIN HFA) 90 mcg/actuation inhaler 2 puff, 2 puff, Inhalation, Q6H PRN, Lad, Saloni U, MD    apixaban (ELIQUIS) tablet 2.5 mg, 2.5 mg, Oral, BID, Lad, Saloni U, MD    aspirin chewable tablet 81 mg, 81 mg, Oral, Daily, Lad, Saloni U, MD    calcitriol (ROCALTROL) capsule 0.5 mcg, 0.5 mcg, Oral, Daily, Lad, Saloni U, MD    cefTRIAXone (ROCEPHIN) 1 g in sodium chloride 0.9 % (NS) 100 mL IVPB-MBP, 1 g, Intravenous, Q24H, Glotfelty, Manfred Arch, MD, Stopped at 10/03/23 0636    cinacalcet (SENSIPAR) tablet 60 mg, 60 mg, Oral, Daily, Lad, Saloni U, MD    ferric citrate (AURYXIA) tablet 1,050 mg, 1,050 mg, Oral, 3xd Meals, Lad, Saloni U, MD    fluticasone furoate (ARNUITY ELLIPTA) 100 mcg/actuation inhaler 1 puff, 1 puff, Inhalation, Daily (RT), Rolena Infante, MD, 1 puff at 10/03/23 8416    hydrOXYzine (ATARAX) tablet 25 mg, 25 mg, Oral, Q6H PRN, Lad, Saloni U, MD, 25 mg at 10/02/23 2225    mirtazapine (REMERON) tablet 15 mg, 15 mg, Oral, Nightly, Lad, Saloni U, MD    nystatin (MYCOSTATIN) powder, , Topical, BID, Lad, Saloni U, MD    senna (SENOKOT) tablet 2 tablet, 2 tablet, Oral, Daily, Lad, Saloni U, MD    sevelamer (RENVELA) tablet 2,400 mg, 2,400 mg, Oral, 3xd Meals, Lad, Radene Gunning, MD    Allergies  Cephalexin, Mango, Peach (prunus persica), Wellbutrin [bupropion hcl], Egg, Fish containing products, Lactase-rennet, Lexiscan [regadenoson], and Mushroom    Family History   Adopted: Yes   Problem Relation Age of Onset    Heart attack Mother 33    Alcohol abuse Mother     Mental illness Mother     No Known Problems Father     No Known Problems Sister     No Known Problems Daughter     No Known Problems Maternal Grandmother     No Known Problems Maternal Grandfather     No Known Problems Paternal Grandmother     No Known Problems Paternal Grandfather     No Known Problems Brother  No Known Problems Son     No Known Problems Maternal Aunt     No Known Problems Maternal Uncle     No Known Problems Paternal Aunt     No Known Problems Paternal Uncle     No Known Problems Other     Anesthesia problems Neg Hx     Broken bones Neg Hx     Cancer Neg Hx     Clotting disorder Neg Hx     Collagen disease Neg Hx     Diabetes Neg Hx     Dislocations Neg Hx     Fibromyalgia Neg Hx     Gout Neg Hx     Hemophilia Neg Hx     Osteoporosis Neg Hx     Rheumatologic disease Neg Hx     Scoliosis Neg Hx     Severe sprains Neg Hx     Sickle cell anemia Neg Hx     Spinal Compression Fracture Neg Hx     Melanoma Neg Hx     Basal cell carcinoma Neg Hx     Squamous cell carcinoma Neg Hx     Breast cancer Neg Hx        Social History  Social History     Tobacco Use    Smoking status: Former     Current packs/day: 0.00     Average packs/day: 2.0 packs/day for 1 year (2.0 ttl pk-yrs)     Types: Cigarettes     Start date: 06/30/2009     Quit date: 06/30/2010     Years since quitting: 13.2    Smokeless tobacco: Never   Vaping Use    Vaping status: Never Used   Substance Use Topics    Alcohol use: Not Currently    Drug use: Not Currently          Physical Exam     ED Triage Vitals   Enc Vitals Group      BP 10/02/23 1143 135/76      Heart Rate 10/02/23 1143 113      SpO2 Pulse 10/02/23 1419 106      Resp 10/02/23 1143 20      Temp 10/02/23 1143 36.7 ??C (98.1 ??F)      Temp Source 10/02/23 1143 Temporal      SpO2 10/02/23 1143 100 %      Weight 10/02/23 1143 81.9 kg (180 lb 10.7 oz)      Height --       Head Circumference --       Peak Flow --       Pain Score --       Pain Loc --       Pain Education --       Exclude from Growth Chart --        Constitutional: Alert and oriented. Well appearing and in no distress.  Eyes: Conjunctivae are normal.  Cardiovascular: Normal rate, regular rhythm.  Good thrill in right upper extremity fistula.  Respiratory: Normal respiratory effort. Breath sounds are normal.  Gastrointestinal: Distended and tender to palpation in all quadrants with warmth along the left lower abdomen.  Genitourinary: No symptoms HPI, deferred exam.  Musculoskeletal: Normal range of motion in all extremities.  Neurologic: Normal speech and language. No gross focal neurologic deficits are appreciated.  Skin: Skin overlying the right upper arm fistula that is to touch, mildly erythemic, and tender to palpation.  Psychiatric: Mood and affect are normal. Speech  and behavior are normal.       Radiology     PVL Hemodialysis Graft/Fistula Duplex   Final Result      CT Abdomen Pelvis Wo Contrast   Final Result      1. Small-moderate free air in the abdomen, presumably related to the recent removal of the peritoneal dialysis catheter.      2. Soft tissue stranding with locules of gas in the left ventral body wall at the site of prior dialysis catheter. Note is made of a focus of high attenuation along the superficial margin of the rectus muscle which could represent active extravasation. There is overlying skin thickening which can be seen with cellulitis. No discrete drainable fluid collection is identified.      3. Additional findings, as described in the body of the report.      The findings were discussed with PA Oluwadarasimi Redmon by Dr. Lane Hacker at 19: 05 on 10/02/2023.                        Wallene Huh, [place]  10/03/23 [phone]

## 2023-10-03 NOTE — Unmapped (Signed)
Care Management  Initial Transition Planning Assessment    Patient lives with husband in private residence.  MFW dialysis with [facility]. Pt is currently self-pay because she let her Medicaid lapse.  Her Medicaid was paying her Medicare premiums so unfortunately she also lost her Medicare.  Pt has been working with Megan Robertson, the insurance liaison at [employer] [phone]) who asked pt to produce paperwork to assist with reinstatement.  Pt was unable to provide necessary paperwork. Megan Robertson has advised that patient's best option is to await open enrollment in January 2025 to regain her Medicare.    Type of Residence: Mailing Address:  [address]  [place] [address]  Contacts: Accompanied by: Alone  Patient Phone Number: [phone] (home)           Medical Provider(s): Megan Robertson, Megan Manuel, MD  Reason for Admission: Admitting Diagnosis:  Complication of AV dialysis fistula, initial encounter [T82.9XXA]  Cellulitis, unspecified cellulitis site [L03.90]  Past Medical History:   has a past medical history of Abnormal mammogram, Abnormal Pap smear of cervix, Allergic, Anemia, Anxiety, Arthritis, Asthma, Back pain, Cancer (CMS-HCC) (Ovarian), Depression, Eczema, ESRD (end stage renal disease) on dialysis (CMS-HCC), FSGS (focal segmental glomerulosclerosis) (1997), GERD (gastroesophageal reflux disease), Gout, H/O adenoidectomy, Hypercholesteremia, Hypertension, Keloid, Ovarian cancer (CMS-HCC), Pancreatitis, Psoriasis, QT prolongation (06/04/2023), Seizure (CMS-HCC), Sepsis (CMS-HCC) (09/16/2022), Stroke (CMS-HCC) (2000), and Tachycardia (07/31/2020).  Past Surgical History:   has a past surgical history that includes Cholecystectomy; Appendectomy; Tonsilectomy, adenoidectomy, bilateral myringotomy and tubes; pr upper gi endoscopy,biopsy (N/A, 08/29/2017); Breast excisional biopsy (Right, 11/28/2016); Total vaginal hysterectomy (11/22/2015); Combined hysteroscopy diagnostic / D&C (07/07/2015); Left oophorectomy (Left, 04/12/2000); Salpingectomy (Left, 04/12/2000); Salpingectomy (Right, 11/22/2015); chg US guide, vascular access (N/A, 01/29/2020); pr creat av fistula,autogenous graft (Left, 01/29/2020); pr creat av fistula,non-autogenous graft (Left, 06/08/2020); chg US guide, vascular access (N/A, 06/08/2020); pr exploration n/flwd surg upper extremity artery (Left, 08/02/2020); pr remv art clot axill-brach,arm incis (Left, 08/02/2020); pr vein bypass graft,subcl-brachial (Left, 08/05/2020); pr incis/drain arm,deep absc/hematoma (Left, 08/14/2020); pr insertion tunnel intraperitoneal cath dial open (Midline, 02/01/2021); Hysterectomy; Oophorectomy; Chemotherapy; pr colsc flx w/rmvl of tumor polyp lesion snare tq (N/A, 09/12/2021); pr colonoscopy w/biopsy single/multiple (09/12/2021); and pr anastomosis,av,any site (Right, 08/15/2023).   Previous admit date: 06/23/2023    Primary Insurance- Payor: /   Secondary Insurance - None  Prescription Coverage -   Preferred Pharmacy - [facility] - [place], [place] - [address]  [facility]  [facility]  [facility] [place] [address]  [facility] [place] - [address]  [facility] [place], [place] - [address]  [facility]    Transportation home: Quarry manager assessed the patient by : In person interview with patient  Orientation Level: Oriented X4  Functional level prior to admission: Independent  Reason for referral: Discharge Planning    Contact/Decision [facility]  Extended Emergency Contact Information  Primary Emergency Contact: ,Megan Robertson  Address: [address]           [place], [place] [address] [place]  Mobile Phone: [phone]  Relation: Spouse  Preferred language: ENGLISH  Interpreter needed? No    Legal Next of Kin / Guardian / POA / Advance Directives  HCDM (patient stated preference): Megan Robertson - Spouse - [phone]    Advance Directive (Medical Treatment)  Does patient have an advance directive covering medical treatment?: Patient does not have advance directive covering medical treatment.  Reason patient does not have an advance directive covering medical treatment:: Patient does not wish to complete one at this time.    Health Care Decision Maker [HCDM] (Medical & Mental Health Treatment)  Healthcare Decision Maker: Patient does not wish to appoint a Health Care Decision Maker at this time  Information offered on HCDM, Medical & Mental Health advance directives:: Patient declined information.    Advance Directive (Mental Health Treatment)  Does patient have an advance directive covering mental health treatment?: Patient does not have advance directive covering mental health treatment.  Reason patient does not have an advance directive covering mental health treatment:: Patient does not wish to complete one at this time.    Readmission Information    Have you been hospitalized in the last 30 days?: No                                Did the following happen with your discharge?                                                     Patient Information  Lives with: Spouse/significant other    Type of Residence: Private residence             Support Systems/Concerns: Spouse    Responsibilities/Dependents at home?: No    Home Care services in place prior to admission?: No          Outpatient/Community Resources in place prior to admission: Clinic       Equipment Currently Used at Home: none       Currently receiving outpatient dialysis?: Yes  Facility providing dialysis (Name/Contact Info): [facility]    Financial Information       Need for financial assistance?: Yes       Social Determinants of Health  Social Determinants of Health were addressed in provider documentation.  Please refer to patient history.  Social Drivers of Health     Food Insecurity: No Food Insecurity (06/26/2023)    Hunger Vital Sign     Worried About Running Out of Food in the Last Year: Never true     Ran Out of Food in the Last Year: Never true   Internet Connectivity: Not on file   Housing/Utilities: Low Risk  (06/26/2023)    Housing/Utilities     Within the past 12 months, have you ever stayed: outside, in a car, in a tent, in an overnight shelter, or temporarily in someone else's home (i.e. couch-surfing)?: No     Are you worried about losing your housing?: No     Within the past 12 months, have you been unable to get utilities (heat, electricity) when it was really needed?: No   Tobacco Use: Medium Risk (10/02/2023)    Patient History     Smoking Tobacco Use: Former     Smokeless Tobacco Use: Never     Passive Exposure: Not on file   Transportation Needs: No Transportation Needs (06/26/2023)    PRAPARE - Therapist, art (Medical): No  Lack of Transportation (Non-Medical): No   Alcohol Use: Not on file   Interpersonal Safety: Not on file   Physical Activity: Not on file   Intimate Partner Violence: Not on file   Stress: Not on file   Substance Use: Not on file (08/27/2023)   Social Connections: Not on file   Financial Resource Strain: Low Risk  (06/26/2023)    Overall Financial Resource Strain (CARDIA)     Difficulty of Paying Living Expenses: Not hard at all   Depression: Not at risk (11/22/2022)    PHQ-2     PHQ-2 Score: 1   Health Literacy: Low Risk  (01/27/2021)    Health Literacy     : Never       Complex Discharge Information    Is patient identified as a difficult/complex discharge?: No                                                               Interventions:       Discharge Needs Assessment  Concerns to be Addressed: discharge planning    Clinical Risk Factors: Principal Diagnosis: Cancer, Stroke, COPD, Heart Failure, AMI, Pneumonia, Joint Replacment    Barriers to taking medications: No    Prior overnight hospital stay or ED visit in last 90 days: No              Anticipated Changes Related to Illness: none    Equipment Needed After Discharge: none    Discharge Facility/Level of Care Needs: other (see comments) (Home)    Readmission  Risk of Unplanned Readmission Score:  %  Predictive Model Details   No score data available for [facility] Risk of Unplanned Readmission     Readmitted Within the Last 30 Days? (No if blank)   Patient at risk for readmission?: No    Discharge Plan  Screen findings are: Discharge planning needs identified or anticipated (Comment).    Expected Discharge Date:     Expected Transfer from Critical Care:  (n/a)    Quality data for continuing care services shared with patient and/or representative?: N/A  Patient and/or family were provided with choice of facilities / services that are available and appropriate to meet post hospital care needs?: N/A       Initial Assessment complete?: Yes

## 2023-10-03 NOTE — Unmapped (Signed)
Nephrology ESKD Consult Note    Requesting provider: Al Corpus, MD  Service requesting consult: Family Medicine [facility])  Reason for consult: Evaluation for dialysis needs    Outpatient dialysis unit:   [facility]  [address] [place] [address]     Assessment/Recommendations: Megan Robertson is a 52 y.o. female with a past medical history notable for ESKD on in-center hemodialysis being evaluated at [facility] for cellulitis overlaying right AVG.    # Cellulitis overlaying AVG. While there is no erythema today, this was clearly present yesterday after discussing with referring doctor (Dr. Stefano Gaul). The erythema could have resolved with ABx and given her elevated white count and limited access (failed LUE AVG), I think it is worth placing line and treating for two weeks, rather than risking continued use of a potential cellulitis overlaying the graft. This was discussed with primary team, Dr. Darlin Drop (surgery) and Dr. Stefano Gaul (primary nephrologist). Pt understands and is in agreement.  - Place temp line until BCx neg x 24hrs. Convert to perm cath prior to discharge.  - Follow up BCx.  - Vanc/cefazolin x 2 wks.     # ESKD:   Outpatient HD Dialysis Rx   Dialysis schedule: MWF   Time: 4  hrs Temp: 36.5C   Dialyzer: Optiflux 180 NRE Heparin: Receiving heparin 1000 unit bolus   Dialysate Bath: 3 K, 2.5Ca, Na 137, Bicarbonate 35   EDW: 81kg kg     - Outpatient dialysis records reviewed; we will plan to continue the same prescription  - Indication for acute dialysis?: No  - We will perform HD starting today and will otherwise attempt to continue pt's outpatient schedule if possible.   - Access:  Need to place CVC to avoid sticking cellulitis overlaying AVG ; Please consult VIR for tunneled line placement before discharge.  - Hepatitis status: Hepatitis B surface antigen negative on 09/11/2023.    # Volume / Hypertension: Well controlled.  - Volume: Will attempt to achieve dry weight if tolerated  - Will adjust daily as needed    # Acid-Base / Electrolytes: Acidemia ; Normokalemia  - Will evaluate acid-base status and electrolyte balance daily and adjust dialysate as needed.  Lab Results   Component Value Date    K 4.5 10/03/2023    CO2 18.8 (L) 10/03/2023       # Anemia of Chronic Kidney Disease: HgB at goal.   - Will continue outpatient management with ESA and adjust as needed.  Lab Results   Component Value Date    HGB 10.5 (L) 10/03/2023       # Secondary Hyperparathyroidism/Hyperphosphatemia: Continue outpt calcitriol/sensipar doses.  - Continue outpatient phosphorus binders and adjust as needed.   Lab Results   Component Value Date    PHOS 6.1 (H) 10/03/2023    CALCIUM 8.1 (L) 10/03/2023       Seraphine Gudiel K Mearl Olver, MD  10/03/2023 12:55 PM   Medical decision-making for 10/03/23  Findings / Data     Patient has: []  acute illness w/systemic sxs  [mod]  []  two or more stable chronic illnesses [mod]  []  one chronic illness with acute exacerbation [mod]  []  acute complicated illness  [mod]  []  Undiagnosed new problem with uncertain prognosis  [mod] [x]  illness posing risk to life or bodily function (ex. AKI)  [high]  []  chronic illness with severe exacerbation/progression  [high]  []  chronic illness with severe side effects of treatment  [high] ESKD  on RRT  Cellulitis overlaying AVG Probs At least 2:  Probs, Data, Risk   I reviewed: [x]  primary team note  []  consultant note(s)  [x]  external records [x]  chemistry results  [x]  CBC results  []  blood gas results  []  Other []  procedure/op note(s)   []  radiology report(s)  []  micro result(s)  []  w/ independent historian(s) Outside dialysis prescription reviewed, K and Hb addressed above >=3 Data Review (2 of 3)    I independently interpreted: []  Urine Sediment  []  Renal US []  CXR Images  []  CT Images  []  Other []  EKG Tracing   Any     I discussed: []  Pathology results w/ QHPs(s) from other specialties  []  Procedural findings w/ QHPs(s) from other specialties []  Imaging w/ QHP(s) from other specialties  [x]  Treatment plan w/ QHP(s) from other specialties Plan discussed with primary team Any     Mgm't requires: []  Prescription drug(s)  [mod]  []  Kidney biopsy  [mod]  []  Central line placement  [mod] []  High risk medication use and/or intensive toxicity monitoring [high]  [x]  Renal replacement therapy [high]  []  High risk kidney biopsy  [high]  []  Escalation of care  [high]  []  High risk central line placement  [high] RRT: High risk of complications from RRT requiring intensive monitoring Risk        _____________________________________________________________________________________    History of Present Illness: Megan Robertson is a 52 y.o. female with ESKD on in-center hemodialysis being evaluated at [facility] for cellulitis who is seen in consultation at the request of Al Corpus, MD and Family Medicine [facility]). Nephrology has been consulted for evaluation of dialysis needs in the setting of end-stage kidney disease.    Pt had new RUE AVG placed in October after being transitioned from [facility] to [facility]. Had PD catheter and CVC removed 10/01/23. On presenting for routine dialysis yesterday, she was noted to have focal erythema over right AVG. No F/D or changes in pain (which she has at baseline), though she does note some 'tightness' when moving her upper arm. She was sent for admission for ABx and evaluation of need to avoid use of AVG. Received a dose of ceftriaxone and vancomycin last night. No fevers but white count today is up to 16.4.     INPATIENT MEDICATIONS:  Current Facility-Administered Medications:     acetaminophen (TYLENOL) tablet 650 mg, Oral, Q4H PRN    albuterol (PROVENTIL HFA;VENTOLIN HFA) 90 mcg/actuation inhaler 2 puff, Inhalation, Q6H PRN    apixaban (ELIQUIS) tablet 2.5 mg, Oral, BID    aspirin chewable tablet 81 mg, Oral, Daily    calcitriol (ROCALTROL) capsule 0.5 mcg, Oral, Daily    cefTRIAXone (ROCEPHIN) 1 g in sodium chloride 0.9 % (NS) 100 mL IVPB-MBP, Intravenous, Q24H    cinacalcet (SENSIPAR) tablet 60 mg, Oral, Daily    [Provider Hold] ferric citrate (AURYXIA) tablet 1,050 mg, Oral, 3xd Meals    fluticasone furoate (ARNUITY ELLIPTA) 100 mcg/actuation inhaler 1 puff, Inhalation, Daily (RT)    hydrOXYzine (ATARAX) tablet 25 mg, Oral, Q6H PRN    lactated ringers bolus 1,000 mL, Intravenous, Once    mirtazapine (REMERON) tablet 15 mg, Oral, Nightly    nystatin (MYCOSTATIN) powder, Topical, BID    ondansetron (ZOFRAN-ODT) disintegrating tablet 4 mg, Oral, Once    senna (SENOKOT) tablet 2 tablet, Oral, Daily    sevelamer (RENVELA) tablet 2,400 mg, Oral, 3xd Meals    OUTPATIENT MEDICATIONS:  Prior to Admission medications    Medication  Dose, Route, Frequency   albuterol HFA 90 mcg/actuation inhaler 2 puffs, Inhalation, Every 6 hours PRN   apixaban (ELIQUIS) 2.5 mg Tab 2.5 mg, Oral, 2 times a day (standard)   aspirin 81 MG chewable tablet 81 mg, Oral, Daily (standard)   beclomethasone dipropionate (QVAR REDIHALER) 80 mcg/actuation inhaler 1 puff, Inhalation, 2 times daily (RT)   calcitriol (ROCALTROL) 0.5 MCG capsule 0.5 mcg, Oral, Daily (standard)   cinacalcet (SENSIPAR) 60 MG tablet 60 mg, Oral, Daily (standard)   ferric citrate (AURYXIA ORAL) 1 g, Oral, 3 times a day (standard)   furosemide (LASIX) 40 MG tablet Take 1 tablet up to 3 times weekly as needed for leg swelling.   gentamicin (GARAMYCIN) 0.1 % ointment APPLY TO EXIT SITE DAILY WITH DRESSING CHANGE   heparin sodium,porcine (HEPARIN, PORCINE,) 1,000 unit/mL 1000 unit/mL injection ADMINISTER 1 ML PER 1,000 ML OF PERITONEAL DIALYSIS SOLUTION AS DIRECTED   heparin sodium,porcine/PF (HEP FLUSH-10, PF, IV) Heparin Sodium (Porcine) 1,000 Units/mL Catheter Lock Arterial   methoxy peg-epoetin beta (MIRCERA INJ) 100 mcg   mirtazapine (REMERON) 15 MG tablet 15 mg, Oral, Nightly   nystatin (MYCOSTATIN) 100,000 unit/gram powder Apply under breasts two times daily until resolution   secukinumab (COSENTYX PEN, 2 PENS,) 150 mg/mL PnIj injection Inject the contents of 2 pens (300 mg total) under the skin every twenty-eight (28) days. Maintenance dose.   senna (SENOKOT) 8.6 mg tablet 2 tablets, Oral, Daily (standard)   sevelamer (RENVELA) 800 mg tablet 2,400 mg, Oral, 3 times a day (with meals)   tizanidine (ZANAFLEX) 2 MG tablet 2 mg, Oral, Daily PRN   triamcinolone (KENALOG) 0.1 % ointment Topical, 2 times a day PRN, Use on psoriasis until flat.        ALLERGIES  Cephalexin, Mango, Peach (prunus persica), Wellbutrin [bupropion hcl], Egg, Fish containing products, Lactase-rennet, Lexiscan [regadenoson], and Mushroom    MEDICAL HISTORY  Past Medical History:   Diagnosis Date    Abnormal mammogram     Abnormal Pap smear of cervix     Allergic     Anemia     Anxiety     Arthritis     Asthma     Back pain     Cancer (CMS-HCC) Ovarian    Depression     Eczema     ESRD (end stage renal disease) on dialysis (CMS-HCC)     FSGS (focal segmental glomerulosclerosis) 1997    renal biopsy at [facility]    GERD (gastroesophageal reflux disease)     Gout     H/O adenoidectomy     had adenoids removed    Hypercholesteremia     Hypertension     Keloid     Ovarian cancer (CMS-HCC)     Pancreatitis     Psoriasis     QT prolongation 06/04/2023    Seizure (CMS-HCC)     unknown etiology; none for several years    Sepsis (CMS-HCC) 09/16/2022    Stroke (CMS-HCC) 2000    Tachycardia 07/31/2020       SOCIAL HISTORY  Social History     Social History Narrative    on disability since 1994, but does not qualify anymore since she got married and her husband has income.     Lives in [place] with husband, Lisbeth Ply and 2 children      reports that she quit smoking about 13 years ago. Her smoking use included cigarettes. She started smoking about 14 years ago. She has a 2 pack-year  smoking history. She has never used smokeless tobacco. She reports that she does not currently use alcohol. She reports that she does not currently use drugs.     FAMILY HISTORY  Family History   Adopted: Yes   Problem Relation Age of Onset    Heart attack Mother 71    Alcohol abuse Mother     Mental illness Mother     No Known Problems Father     No Known Problems Sister     No Known Problems Daughter     No Known Problems Maternal Grandmother     No Known Problems Maternal Grandfather     No Known Problems Paternal Grandmother     No Known Problems Paternal Grandfather     No Known Problems Brother     No Known Problems Son     No Known Problems Maternal Aunt     No Known Problems Maternal Uncle     No Known Problems Paternal Aunt     No Known Problems Paternal Uncle     No Known Problems Other     Anesthesia problems Neg Hx     Broken bones Neg Hx     Cancer Neg Hx     Clotting disorder Neg Hx     Collagen disease Neg Hx     Diabetes Neg Hx     Dislocations Neg Hx     Fibromyalgia Neg Hx     Gout Neg Hx     Hemophilia Neg Hx     Osteoporosis Neg Hx     Rheumatologic disease Neg Hx     Scoliosis Neg Hx     Severe sprains Neg Hx     Sickle cell anemia Neg Hx     Spinal Compression Fracture Neg Hx     Melanoma Neg Hx     Basal cell carcinoma Neg Hx     Squamous cell carcinoma Neg Hx     Breast cancer Neg Hx         Physical Exam:  Vitals:    10/03/23 1239   BP: 105/54   Pulse: 105   Resp: 18   Temp: 36.9 ??C (98.4 ??F)   SpO2: 100%     No intake/output data recorded.    Intake/Output Summary (Last 24 hours) at 10/03/2023 1255  Last data filed at 10/02/2023 2015  Gross per 24 hour   Intake 200 ml   Output --   Net 200 ml       General: well-appearing, no acute distress  Heart: RRR, no m/r/g  Lungs: CTAB, normal wob  Abd: soft, non-tender, non-distended  Ext: no edema  Access: RUE AV fistula without erythema but warm and very tender to touch

## 2023-10-03 NOTE — Unmapped (Addendum)
[ ]  OP consideration of whether still needs both ASA and eliquis    Megan Robertson is a 52 y.o. female with a past medical history significant for ESRD, anxiety, and chronic pain, who presented to [facility] ED on 12/11 on the advice of her nephrologist with concern for cellulitis of right arm over her fistula site, also reports abdominal pain from recent peritoneal dialysis catheter removal site (removed 12/10). Continued LLQ and RUE pain on palpation.     # Cellulitis  # Abdominal Pain, improving   Presented with erythema, edema, and pain over right AV fistula site and abdominal pain and warmth near PD removal site, Nephrologist concerned for AVG site cellulitis. Right AVG site placed by General Surgery 08/15/2023 due to poor response with peritoneal dialysis. PD catheter and right internal jugular catheter were both removed on 12/10 without complication. States pain at her right AVG site is at baseline. CTAP with soft tissue stranding with locules of gas and skin thickening, consistent with cellulitis over the left ventral region, also notes area of high attenuation along superficial margin of rectus muscle which could represent active extravasation. S/p CTX, vancomycin, and zosyn in ED for concern for peritoneal infection. S/p temporary LIJ non-tunneled trialysis with VIR on 12/12 PM. VS have remained stable, afebrile, intermittently tachycardic. Initial exam notable for erythema to skin diffusely (arms, abdomen, chest) and itching, which has improved on exam 12/15 now with mild skin peeling. Continues to have abdominal tenderness in LLQ, but slowly improving. Prior PD site and RIJ catheter sites well healing, and new LIJ site c/d/I. Repeat CTAP 12/14 with evolving post-surgical changes, enlarging abdominal lymph nodes likely reactive, and moderate rectal stool ball. Nephrology, General Surgery, and VIR consulted during this admission. Discharged with plan to continue ***PO linezolid 600 mg BID for total of 14 days.      # ESRD on hemodialysis   # Hyperphosphatemia - Secondary Hyperparathyroidism  HD on MWF, last dialyzed Monday (12/9). Did not receive dialysis 12/11 due to concern for cellulitis of right AVG as above. No need for emergent HD. Has temporary line as above.   - Nephrology consulted, recommendations appreciated  - VIR consulted, recommendations appreciated              - Likely pending tunnel line placement on 12/16              - NPO at midnight for placement              - tPA if LIJ CVC sluggish  - Daily BMP, Phos, Mg  - Continue sevelamer 2400 mg PO TID with meals  - Home ferric citrate not on formulary, will work towards getting approval if staying beyond 12/16  - Continue calcitriol 0.5 mcg daily and cinacalcet 60 mg daily  - Strict I&Os     # Hx of Normocytic Anemia   Hgb 11.6 on admission; down to 9.9 today (12/14). Receives Epo with dialysis.     # Itching   # Eczema  Patient reported body itching on night of admission, improved with hydroxyzine. Reports itching as a reaction to antibiotics in the past. Erythema and itching improved during admission without any evidence of anaphylactic features. Do wonder if erythema and itching could have partly been a reaction to vancomycin as improving and vancomycin level is decreasing.     # Hx of clotted AVF   06/2023 had an AVF of the left brachiocephalic vein. PVL 12/12 suggests the right brachioaxillary AVG is  patent throughout, demonstrates a non-vascularized mixed echogenic area adjacent to the arterial anastomosis.      Chronic and Stable Conditions:  # Asthma: Continue home albuterol prn, Anuity Ellipta 100 mcg daily, CPAP nightly   # Anxiety: Continue home Mirtazapine 15 mg, PO, nightly

## 2023-10-03 NOTE — Unmapped (Signed)
Family Medicine Inpatient Service  History and Physical Note    Team: [facility] (pgr [phone])  PCP: Rudie Meyer, MD  Date of Admission: October 02, 2023  Code Status: full code  Emergency Contact: Lisbeth Ply - Husband [phone])     ASSESSMENT / PLAN:   Megan Robertson is a 52 y.o. female with a past medical history significant for ESRD who presents with concern for cellulitis    # Cellulitis  Presented with some redness, swelling, and soreness of her right arm over her fistula site as well as some warmth around her bandaged site of her peritoneal dialysis catheter removal site. PD catheter was removed yesterday with no complications but has been noticing increased pain and warmth at both sites. CT A/P demonstrates soft tissue stranding with locules of gas and skin thickening, consistent with cellulitis. Initially started on CTX and broadened to Zosyn and Vancomycin for concern of peritoneal infection. Imaging is not concerning for an abscess and physical exam is not concerning for acute peritonitis. She has been afebrile and vital signs are otherwise stable.   - Continue 1g CTX and can consider switch to Augmentin when tolerating PO   - Can consider surgical consult in AM to determine if graft can be used for dialysis  - FU blood cultures     # ESRD on hemodialysis   Was not able to be dialyzed at her session today. Usually goes MWF, last dialysis was Monday (12/9). No acute need for emergent hemodialysis. Cr 7.98 on admission.  - Consult nephrology in AM   - Consider consult VIR in AM for possible replacement of tunneled line  - Strict I&Os    # Hx of normocytic anemia   Hgb 11.6 on admission.   - Gets epo with dialysis     # Hx of clotted AVF   06/2023 had an AVF of the left brachiocephalic vein.   - Continue home Eliquis 2.5 mg   - Repeat PVLs    # FEN/GI:  - IVF None  - Check electrolytes as indicated, replete as needed.  - Diet Renal    # PPX:   - DVT: Continue home anticoagulation with Elquis    # Dispo: Floor  [ ]  Anticipated Discharge Location: Home  [ ]  PT/OT/DME: PT/OT ordered  [ ]  CM/SW needs: None anticipated  [ ]  Meds/Rx:  Not yet prescribed. No special med needs  [ ]  Teaching: None anticipated  [ ]  Follow up appt: Appointment needed  [ ]  Excuse letter: None anticipated  [ ]  Transport: Private Needed    HISTORY OF PRESENT ILLNESS:  Megan Robertson is a 52 y.o. female who presents with cellulitis. She arrived at dialysis today with some new onset erythema overlying the right AV graft. She has a history significant for recent removal of her left lower abdomen peritoneal catheter as well as a tunneled port catheter to her right clavicular region.  Transferred to [facility] for placement of a temporary dialysis catheter, hemodialysis, and IV antibiotics.     ED Course: Blood cultures were pulled while in ED. Patient does not require emergent dialysis at this time. ED provider spoke with on-call surgery attending who was agreeable to admitting patient to medicine team if there are no surgical findings on CT and pelvis. CT A/P demonstrates small-moderate free air in the abdomen with some soft tissue gas and stranding in the left ventral body wall at the site of prior peritoneal catheter. Started on CTX and  Zosyn for anaerobic coverage.     PAST MEDICAL / SURGICAL HX:  Past Medical History:   Diagnosis Date    Abnormal mammogram     Abnormal Pap smear of cervix     Allergic     Anemia     Anxiety     Arthritis     Asthma     Back pain     Cancer (CMS-HCC) Ovarian    Depression     Eczema     ESRD (end stage renal disease) on dialysis (CMS-HCC)     FSGS (focal segmental glomerulosclerosis) 1997    renal biopsy at [facility]    GERD (gastroesophageal reflux disease)     Gout     H/O adenoidectomy     had adenoids removed    Hypercholesteremia     Hypertension     Keloid     Ovarian cancer (CMS-HCC)     Pancreatitis     Psoriasis     QT prolongation 06/04/2023    Seizure (CMS-HCC)     unknown etiology; none for several years    Sepsis (CMS-HCC) 09/16/2022    Stroke (CMS-HCC) 2000    Tachycardia 07/31/2020     Past Surgical History:   Procedure Laterality Date    APPENDECTOMY      BREAST EXCISIONAL BIOPSY Right 11/28/2016    CHEMOTHERAPY      for ovarian CA in 2000    CHG US GUIDE, VASCULAR ACCESS N/A 01/29/2020    Procedure: ULTRASOUND GUIDANCE FOR VASC ACCESS REQUIRING [place] EVAL OF POTENTIAL ACCESS SITES;  Surgeon: Leona Carry, MD;  Location: MAIN OR [facility];  Service: Transplant    CHG US GUIDE, VASCULAR ACCESS N/A 06/08/2020    Procedure: ULTRASOUND GUIDANCE FOR VASC ACCESS REQUIRING [place] EVAL OF POTENTIAL ACCESS SITES;  Surgeon: Leona Carry, MD;  Location: MAIN OR [facility];  Service: Transplant    CHOLECYSTECTOMY      COMBINED HYSTEROSCOPY DIAGNOSTIC / D&C  07/07/2015    Planned for endometrial ablation, but patient had uterine perforation after D&C and unable to perform    HYSTERECTOMY      LEFT OOPHORECTOMY Left 04/12/2000    Removed with L fallopian tube for ectopic pregnancy    OOPHORECTOMY      PR ANASTOMOSIS,AV,ANY SITE Right 08/15/2023    Procedure: ARTERIOVENOUS ANASTOMOSIS, OPEN; DIRECT, ANY SITE, UPPER EXTREMITY;  Surgeon: Dyann Ruddle, MD;  Location: [facility] OR [facility];  Service: General Surgery    PR COLONOSCOPY W/BIOPSY SINGLE/MULTIPLE  09/12/2021    Procedure: COLONOSCOPY, FLEXIBLE, PROXIMAL TO SPLENIC FLEXURE; WITH BIOPSY, SINGLE OR MULTIPLE;  Surgeon: Maris Berger, MD;  Location: [facility];  Service: Gastroenterology    PR COLSC FLX W/RMVL OF TUMOR POLYP LESION SNARE TQ N/A 09/12/2021    Procedure: COLONOSCOPY FLEX; W/REMOV TUMOR/LES BY SNARE;  Surgeon: Maris Berger, MD;  [facility];  Service: Gastroenterology    PR CREAT AV FISTULA,AUTOGENOUS GRAFT Left 01/29/2020    Procedure: Create Av Fistula (Separt Proc); Autog Gft;  Surgeon: Leona Carry, MD;  Location: MAIN OR [facility];  Service: Transplant    PR CREAT AV FISTULA,NON-AUTOGENOUS GRAFT Left 06/08/2020    Procedure: CREATE AV FISTULA (SEPARATE PROC); NONAUTOGENOUS GRAFT (EG, BIOLOGICAL COLLAGEN, THERMOPLASTIC GRAFT);  Surgeon: Leona Carry, MD;  Location: MAIN OR [facility];  Service: Transplant    PR EXPLORATION N/FLWD SURG UPPER EXTREMITY ARTERY Left 08/02/2020    Procedure: Exploration Not Followed By Surgical Repair,  Artery; Upper Extremity (Eg, Axillary, Brachial, Radial, Ulnar);  Surgeon: Earney Mallet, MD;  Location: MAIN OR [facility];  Service: Vascular    PR INCIS/DRAIN ARM,DEEP ABSC/HEMATOMA Left 08/14/2020    Procedure: INCISION AND DRAINAGE, UPPER ARM OR ELBOW AREA; DEEP ABSCESS OR HEMATOMA;  Surgeon: Earney Mallet, MD;  Location: MAIN OR [place];  Service: Vascular    PR INSERTION TUNNEL INTRAPERITONEAL CATH DIAL OPEN Midline 02/01/2021    Procedure: INSERTION OF INTRAPERITONEAL CANNULA OR CATHETER FOR DRAINAGE OR DIALYSIS; PERMANENT;  Surgeon: Loney Hering, MD;  Location: MAIN OR [place];  Service: Transplant    PR REMV ART CLOT AXILL-BRACH,ARM INCIS Left 08/02/2020    Procedure: Embolect/Thrombec; Axilry/Brachial Art-Arm Incs;  Surgeon: Earney Mallet, MD;  Location: MAIN OR [facility];  Service: Vascular    PR UPPER GI ENDOSCOPY,BIOPSY N/A 08/29/2017    Procedure: UGI ENDOSCOPY; WITH BIOPSY, SINGLE OR MULTIPLE;  Surgeon: Neysa Hotter, MD;  Location: GI PROCEDURES [facility];  Service: Gastroenterology    PR VEIN BYPASS GRAFT,SUBCL-BRACHIAL Left 08/05/2020    Procedure: Bypass Graft, With Vein; Subclavian-Brachial;  Surgeon: Earney Mallet, MD;  Location: MAIN OR [facility];  Service: Vascular    SALPINGECTOMY Left 04/12/2000    Ectopic pregnancy    SALPINGECTOMY Right 11/22/2015    at time of total vaginal hysterectomy    TONSILECTOMY, ADENOIDECTOMY, BILATERAL MYRINGOTOMY AND TUBES      TOTAL VAGINAL HYSTERECTOMY  11/22/2015    With R salpingectomy       FAMILY HX:   Family History   Adopted: Yes   Problem Relation Age of Onset    Heart attack Mother 61    Alcohol abuse Mother     Mental illness Mother     No Known Problems Father     No Known Problems Sister     No Known Problems Daughter     No Known Problems Maternal Grandmother     No Known Problems Maternal Grandfather     No Known Problems Paternal Grandmother     No Known Problems Paternal Grandfather     No Known Problems Brother     No Known Problems Son     No Known Problems Maternal Aunt     No Known Problems Maternal Uncle     No Known Problems Paternal Aunt     No Known Problems Paternal Uncle     No Known Problems Other     Anesthesia problems Neg Hx     Broken bones Neg Hx     Cancer Neg Hx     Clotting disorder Neg Hx     Collagen disease Neg Hx     Diabetes Neg Hx     Dislocations Neg Hx     Fibromyalgia Neg Hx     Gout Neg Hx     Hemophilia Neg Hx     Osteoporosis Neg Hx     Rheumatologic disease Neg Hx     Scoliosis Neg Hx     Severe sprains Neg Hx     Sickle cell anemia Neg Hx     Spinal Compression Fracture Neg Hx     Melanoma Neg Hx     Basal cell carcinoma Neg Hx     Squamous cell carcinoma Neg Hx     Breast cancer Neg Hx        SOCIAL HX:   Social History     Socioeconomic History    Marital status: Married     Spouse  name: None    Number of children: None    Years of education: None    Highest education level: None   Tobacco Use    Smoking status: Former     Current packs/day: 0.00     Average packs/day: 2.0 packs/day for 1 year (2.0 ttl pk-yrs)     Types: Cigarettes     Start date: 06/30/2009     Quit date: 06/30/2010     Years since quitting: 13.2    Smokeless tobacco: Never   Vaping Use    Vaping status: Never Used   Substance and Sexual Activity    Alcohol use: Not Currently    Drug use: Not Currently    Sexual activity: Yes     Partners: Male     Birth control/protection: Bilateral Tubal Ligation   Other Topics Concern    Do you use sunscreen? No    Tanning bed use? No    Are you easily burned? No    Excessive sun exposure? No    Blistering sunburns? No   Social History Narrative on disability since 1994, but does not qualify anymore since she got married and her husband has income.     Lives in [place] with husband, Lisbeth Ply and 2 children     Social Drivers of Health     Financial Resource Strain: Low Risk  (06/26/2023)    Overall Financial Resource Strain (CARDIA)     Difficulty of Paying Living Expenses: Not hard at all   Food Insecurity: No Food Insecurity (06/26/2023)    Hunger Vital Sign     Worried About Running Out of Food in the Last Year: Never true     Ran Out of Food in the Last Year: Never true   Transportation Needs: No Transportation Needs (06/26/2023)    PRAPARE - Therapist, art (Medical): No     Lack of Transportation (Non-Medical): No       MEDICATIONS / ALLERGIES:  (Not in a hospital admission)      Allergies   Allergen Reactions    Cephalexin Itching     unknown; tolerates cefepime    Mango Itching    Peach (Prunus Persica) Itching    Wellbutrin [Bupropion Hcl] Other (See Comments)     Seizure    Egg Itching    Fish Containing Products Itching    Lactase-Rennet Nausea And Vomiting    Lexiscan [Regadenoson] Nausea And Vomiting     Patient had nausea after the Lexi then vomitted [id number]    Mushroom Itching       IMMUNIZATIONS:  Immunization History   Administered Date(s) Administered    INFLUENZA TIV (TRI) 16MO+ W/ PRESERV (IM) 08/08/2011    PNEUMOCOCCAL POLYSACCHARIDE 23-VALENT 11/23/2015    TdaP 10/07/2019       REVIEW OF SYSTEMS:  Pertinent positives and negatives per HPI. A complete review of systems otherwise negative.    PHYSICAL EXAM:    Initial ED Vitals:   ED Triage Vitals   Enc Vitals Group      BP 10/02/23 1143 135/76      Heart Rate 10/02/23 1143 113      SpO2 Pulse 10/02/23 1419 106      Resp 10/02/23 1143 20      Temp 10/02/23 1143 36.7 ??C (98.1 ??F)      Temp Source 10/02/23 1143 Temporal      SpO2 10/02/23 1143 100 %      Weight 10/02/23  1143 81.9 kg (180 lb 10.7 oz)      Height --       Head Circumference --       Peak Flow --       Pain Score --       Pain Loc --       Pain Education --       Exclude from Growth Chart --        Recent Vitals:  Vitals:    10/02/23 1854   BP: 122/59   Pulse: 102   Resp: 16   Temp:    SpO2: 100%       GEN: Well-appearing, lying in bed, NAD   Eyes: PERRL. No scleral icterus. Conjunctiva non-erythematous. EOMI.  CV: Regular rate and rhythm. Extra heart sound auscultated. No costochondral tenderness. No cyanosis or clubbing. Cap Refill ~ 4 seconds.   Pulm: CTAB. No wheezing, crackles, or rhonchi.  Abd: Flat.  No guarding, rebound.  Normoactive bowel sounds.  Bandage present on her abdomen, TTP directly below bandage.  Neuro: A&O x 3. No focal deficits.   Ext: No peripheral edema. Cold extremities.  Skin: No rashes or skin lesions.      LABS/ STUDIES:  All imaging, laboratory studies, and other pertinent tests including electrocardiography were reviewed prior to admission and are summarized within the assessment and plan.     Rolena Infante, MD, PGY1  October 02, 2023 7:58 PM

## 2023-10-03 NOTE — Unmapped (Signed)
ULTRASOUND PIV PROCEDURE NOTE    Indications:   Poor venous access.    Ultrasound guidance was necessary to obtain access.     Procedure Details:  Identity of the patient was confirmed via name, medical record number and date of birth. The availability of the correct equipment was verified.    The vein was identified and measured for ultrasound catheter insertion.       Vein measurement (without tourniquet):   0.27 cm   A(n) 22 gauge 1 catheter was selected based on the recommendations below:    [facility] Catheter/Vein Ratio Guidelines    Chart to determine PIV catheter size/length to use based on vein diameter and depth   Catheter Gauge Size (g)  22g 20g 18g   Catheter length (inches)  1.75 1.75 1.75   Catheter diameter measurement (mm) 0.9 mm 1.1 mm 1.3 mm          Minimum required vein diameter       Sonosite (cm)  0.27 cm 0.33 cm 0.39 cm          Maximum vein depth  1.25 cm 1.25 cm 1.25 cm          The field was prepared with necessary supplies and equipment.  Probe cover and sterile gel utilized. Insertion site was prepped with chlorhexidineand allowed to dry.  The catheter extension was primed with normal saline.  The [place] PIV was placed in the LAC with 1attempt(s). See LDA for additional details.    Catheter aspirated, 3 mL blood return present. The catheter was then flushed with 10 mL of normal saline. Insertion site cleansed, and dressing applied per manufacturer guidelines. The catheter was inserted with difficulty due to poor vasculature by Betsey Holiday, RN.    Thank you,     Betsey Holiday, RN    Ultrasound Resource Nurse    Workup / Procedure Time:  30 minutes

## 2023-10-03 NOTE — Unmapped (Signed)
Patient is seen in consultation at the request of Al Corpus, MD with [facility]) for possible placement of a central venous access device.    Assessment & Recommendations     Megan Robertson is a 52 y.o. female with PMH of ESRD on HD (M/W/F) who is now admitted due to concern for cellulitis overlying new right brachioaxillary AV arc graft that was created on 08/15/23 with Dr. Darlin Drop. At patient's postop visit on 09/04/23, she was cleared to use the AVG. Patient then underwent removal of RIJ tunneled HD catheter and peritoneal dialysis catheter on 10/01/23. Patient now admitted with new erythema overlying the right arm AVG. Blood cultures drawn on 12/11 at 16:00 and are pending. Consult request for placement of a non-tunneled HD catheter for short-term dialysis versus a tunneled HD catheter if AVG cannot be used long-term while undergoing antibiotic treatment.     Discussed with team that a tunneled line cannot be placed until blood cultures are NGTD x 48 hours. Per nephrology, would like to dialyze today. Therefore, can proceed with non-tunneled Trialysis catheter placement and then convert this to a tunneled line next week if needed outpatient.     - VIR recommends placement of a Dialysis - Non-tunneled. Trialysis catheter.  - Anticipated procedure date: 12/11   - Sedation plan: Moderate sedation  - This is a low bleeding risk procedure.  INR <= 3.0  Platelets >= 20  Anticoagulation does not need to be held.  - Iodinated contrast allergy: No.  - Antibiotics needed: No  - Pending blood cultures: Yes. Per [facility] policy, blood cultures must be negative for 48 hours prior to placing a tunneled central venous catheter.    Informed Consent  Yes; this procedure has been fully reviewed with the patient. This included a discussion of the risks, benefits, and alternatives including but not limited to bleeding, infection, and/or damage to surrounding structures. All questions were answered to their satisfaction, and they would like to proceed with the procedure.   --The patient will accept blood products in an emergent situation.  --The patient does not have a Do Not Resuscitate order in effect.    Consent obtained by Cranford Mon NP witnessed, and scanned into patient's chart (see media tab).    Thank you for involving [place] in the care of this patient. Please page the VIR consult pager [phone]) with further questions, concerns, or if new issues arise.     Subjective     History of Present Illness:  Megan Robertson is a 52 y.o. female with PMH of ESRD on HD (M/W/F) who is now admitted due to concern for cellulitis overlying new RUE AVG. Patient is s/p creation of right brachioaxillary AV arc graft on 08/15/23 with Dr. Darlin Drop. At patient's postop visit on 09/04/23, she was cleared to use the AVG. Patient then underwent removal of RIJ tunneled HD catheter and peritoneal dialysis catheter on 10/01/23 with Dr. Tiffany Kocher as they were no longer needed. Patient presented for HD on 10/02/23 with new erythema overlying the right arm AVG and she was sent to the [facility] ED for further evaluation and management (did not have dialysis). Patient is afebrile, however tachycardic to low 100s with leukocytosis to 16.4. Blood cultures drawn on 12/11 at 16:00 and are pending. There was also noted to be cellulitic changes at the prior RIJ catheter site and prior PD catheter site (see pictures in media). Consult request for placement of a non-tunneled HD catheter for short-term dialysis  versus a tunneled HD catheter if AVG cannot be used long-term while undergoing antibiotic treatment.     Patient's line history includes:  LUE fistula creation c/b occlusion now s/p thromboembolectomy and left subclavian bypass   Multiple RIJ central lines    Review of Systems: Pertinent items are noted in HPI.    Past Medical History:  Past Medical History:   Diagnosis Date    Abnormal mammogram     Abnormal Pap smear of cervix     Allergic Anemia     Anxiety     Arthritis     Asthma     Back pain     Cancer (CMS-HCC) Ovarian    Depression     Eczema     ESRD (end stage renal disease) on dialysis (CMS-HCC)     FSGS (focal segmental glomerulosclerosis) 1997    renal biopsy at [facility]    GERD (gastroesophageal reflux disease)     Gout     H/O adenoidectomy     had adenoids removed    Hypercholesteremia     Hypertension     Keloid     Ovarian cancer (CMS-HCC)     Pancreatitis     Psoriasis     QT prolongation 06/04/2023    Seizure (CMS-HCC)     unknown etiology; none for several years    Sepsis (CMS-HCC) 09/16/2022    Stroke (CMS-HCC) 2000    Tachycardia 07/31/2020       Past Surgical History:  Past Surgical History:   Procedure Laterality Date    APPENDECTOMY      BREAST EXCISIONAL BIOPSY Right 11/28/2016    CHEMOTHERAPY      for ovarian CA in 2000    CHG US GUIDE, VASCULAR ACCESS N/A 01/29/2020    Procedure: ULTRASOUND GUIDANCE FOR VASC ACCESS REQUIRING [place] EVAL OF POTENTIAL ACCESS SITES;  Surgeon: Leona Carry, MD;  Location: MAIN OR [facility];  Service: Transplant    CHG US GUIDE, VASCULAR ACCESS N/A 06/08/2020    Procedure: ULTRASOUND GUIDANCE FOR VASC ACCESS REQUIRING [place] EVAL OF POTENTIAL ACCESS SITES;  Surgeon: Leona Carry, MD;  Location: MAIN OR [facility];  Service: Transplant    CHOLECYSTECTOMY      COMBINED HYSTEROSCOPY DIAGNOSTIC / D&C  07/07/2015    Planned for endometrial ablation, but patient had uterine perforation after D&C and unable to perform    HYSTERECTOMY      LEFT OOPHORECTOMY Left 04/12/2000    Removed with L fallopian tube for ectopic pregnancy    OOPHORECTOMY      PR ANASTOMOSIS,AV,ANY SITE Right 08/15/2023    Procedure: ARTERIOVENOUS ANASTOMOSIS, OPEN; DIRECT, ANY SITE, UPPER EXTREMITY;  Surgeon: Dyann Ruddle, MD;  Location: [facility] OR [facility];  Service: General Surgery    PR COLONOSCOPY W/BIOPSY SINGLE/MULTIPLE  09/12/2021    Procedure: COLONOSCOPY, FLEXIBLE, PROXIMAL TO SPLENIC FLEXURE; WITH BIOPSY, SINGLE OR MULTIPLE;  Surgeon: Maris Berger, MD;  [facility];  Service: Gastroenterology    PR COLSC FLX W/RMVL OF TUMOR POLYP LESION SNARE TQ N/A 09/12/2021    Procedure: COLONOSCOPY FLEX; W/REMOV TUMOR/LES BY SNARE;  Surgeon: Maris Berger, MD;  Location: [facility];  Service: Gastroenterology    PR CREAT AV FISTULA,AUTOGENOUS GRAFT Left 01/29/2020    Procedure: Create Av Fistula (Separt Proc); Autog Gft;  Surgeon: Leona Carry, MD;  Location: MAIN OR [facility];  Service: Transplant    PR CREAT AV FISTULA,NON-AUTOGENOUS GRAFT Left 06/08/2020  Procedure: CREATE AV FISTULA (SEPARATE PROC); NONAUTOGENOUS GRAFT (EG, BIOLOGICAL COLLAGEN, THERMOPLASTIC GRAFT);  Surgeon: Leona Carry, MD;  Location: MAIN OR [facility];  Service: Transplant    PR EXPLORATION N/FLWD SURG UPPER EXTREMITY ARTERY Left 08/02/2020    Procedure: Exploration Not Followed By Surgical Repair, Artery; Upper Extremity (Eg, Axillary, Brachial, Radial, Ulnar);  Surgeon: Earney Mallet, MD;  Location: MAIN OR [facility];  Service: Vascular    PR INCIS/DRAIN ARM,DEEP ABSC/HEMATOMA Left 08/14/2020    Procedure: INCISION AND DRAINAGE, UPPER ARM OR ELBOW AREA; DEEP ABSCESS OR HEMATOMA;  Surgeon: Earney Mallet, MD;  Location: MAIN OR [place];  Service: Vascular    PR INSERTION TUNNEL INTRAPERITONEAL CATH DIAL OPEN Midline 02/01/2021    Procedure: INSERTION OF INTRAPERITONEAL CANNULA OR CATHETER FOR DRAINAGE OR DIALYSIS; PERMANENT;  Surgeon: Loney Hering, MD;  Location: MAIN OR [place];  Service: Transplant    PR REMV ART CLOT AXILL-BRACH,ARM INCIS Left 08/02/2020    Procedure: Embolect/Thrombec; Axilry/Brachial Art-Arm Incs;  Surgeon: Earney Mallet, MD;  Location: MAIN OR [facility];  Service: Vascular    PR UPPER GI ENDOSCOPY,BIOPSY N/A 08/29/2017    Procedure: UGI ENDOSCOPY; WITH BIOPSY, SINGLE OR MULTIPLE;  Surgeon: Neysa Hotter, MD;  Location: GI PROCEDURES [facility];  Service: Gastroenterology    PR VEIN BYPASS GRAFT,SUBCL-BRACHIAL Left 08/05/2020    Procedure: Bypass Graft, With Vein; Subclavian-Brachial;  Surgeon: Earney Mallet, MD;  Location: MAIN OR [facility];  Service: Vascular    SALPINGECTOMY Left 04/12/2000    Ectopic pregnancy    SALPINGECTOMY Right 11/22/2015    at time of total vaginal hysterectomy    TONSILECTOMY, ADENOIDECTOMY, BILATERAL MYRINGOTOMY AND TUBES      TOTAL VAGINAL HYSTERECTOMY  11/22/2015    With R salpingectomy       Allergies:  Allergies   Allergen Reactions    Cephalexin Itching     unknown; tolerates cefepime    Mango Itching    Peach (Prunus Persica) Itching    Wellbutrin [Bupropion Hcl] Other (See Comments)     Seizure    Egg Itching    Fish Containing Products Itching    Lactase-Rennet Nausea And Vomiting    Lexiscan [Regadenoson] Nausea And Vomiting     Patient had nausea after the Lexi then vomitted [id number]    Mushroom Itching        Objective     Physical Exam:  Vitals:    10/03/23 1239   BP: 105/54   Pulse: 105   Resp: 18   Temp: 36.9 ??C (98.4 ??F)   SpO2: 100%        Physical Exam:   General: no acute distress   Resp: nonlabored breathing on room air   Neuro: no obvious deficits     Airway assessment: Class 2 - Can visualize soft palate and fauces, tip of uvula is obscured    ASA 3 - Patient with moderate systemic disease with functional limitations    Pertinent Labs:  Recent Labs     10/02/23  1559 10/03/23  0621   WBC 10.8 16.4*   HGB 11.6 10.5*   HCT 37.2 34.3   PLT 216 192       Recent Labs     10/02/23  1559 10/03/23  0621   NA 141 140   K 4.4 4.5   CL 103 103   BUN 35* 35*   CREATININE 7.98* 8.82*   GLU 90 98   Estimated Creatinine Clearance: 7.4 mL/min (A) (based on  SCr of 8.82 mg/dL (H)).     Recent Labs     10/02/23  1559   INR 1.00       Recent Labs     10/02/23  1559   PROT 7.0   ALBUMIN 3.2*   AST 31   ALT 17   ALKPHOS 95   BILITOT 0.2*       Lab Results   Component Value Date    LABBLOO No Growth at 5 days 06/23/2023 LABBLOO No Growth at 5 days 06/22/2023    LABBLOO No Growth at 5 days 06/04/2023    LABBLOO No Growth at 5 days 06/03/2023       Pertinent Imaging: No relevant imaging was available for review.

## 2023-10-03 NOTE — Unmapped (Signed)
[facility] - Operative Note     VIR Post-Procedure Note  Procedure Name: nontunneled hemodialysis line placement  Pre-Op Diagnosis: ESRD on HD with AVG malfunction   Post-Op Diagnosis: Same as pre-operative diagnosis    VIR Providers  Fellow/Resident: Cranford Mon, ACNP    Time out: Prior to the procedure, a time out was performed with all team members present. During the time out, the patient, procedure and procedure site when applicable were verbally verified by the team members and Cranford Mon, ACNP.    Description of procedure: Successful placement of non-tunneled hemodialysis (Trialysis) catheter via the left internal jugular vein.     Sedation:Moderate sedation    Estimated Blood Loss: approximately <3 mL    Complications: None    See detailed procedure note with images in PACS [facility]).    The patient tolerated the procedure well without incident or complication and left the room in stable condition.    Cranford Mon, ACNP  10/03/2023 3:03 PM

## 2023-10-03 NOTE — Unmapped (Signed)
Surgery Consult Note  Date: 10/03/2023  Consulting attending: Dr. Darlin Drop  Requesting service: FM  Requesting physician: Al Corpus, MD    Assessment:    Megan Robertson is a 52 yo F with a PMH of ESRD who underwent creation of right brachioaxillary arteriovenous arc graft 08/15/23. She is also 2 days out from removal of her intraperitoneal dialysis cath with IR on 10/01/23. The patient was admitted due to concern for cellulitis in the Right arm around AVG site, and infection in the abdomen, hence surgery was consulted. CT abdomen showed small-moderate free air in the abdomen, presumably related to the recent removal of the peritoneal dialysis catheter, and Soft tissue stranding with locules of gas in the left ventral body wall at the site of prior dialysis catheter with possible cellulitis.PVL obtained of AVG revealed that the graft was patent. VSS, AF. Patient also has a history of eczema for which she takes once monthly injections for, hence could be chronically immunosuppressed and vulnerable to infections in the setting of leukocytosis this AM.     Recommendations:    - No surgical intervention indicated at this time.  - Agree with resting AVG and treating possible cellulitis.  - continue IV abx.  - proceed with a temporary tunneled line for dialysis in the mean time.     Thank you for inviting [place] to assist in the care of your patient. Please feel free to page the [phone] with any questions or changes in clinical condition. We will continue to follow with you.    This patient was discussed and evaluated with Dr. Darlin Drop (Attending) who agrees with the plan.                             History of Present Illness:  Megan Robertson is a 52 yo F with a PMH of ESRD who underwent creation of right brachioaxillary arteriovenous arc graft 08/15/23. She is also 2 days out from removal of her intraperitoneal dialysis cath with IR on 10/01/23. The patient was admitted due to concern for cellulitis in the Right arm around AVG site, and infection in the abdomen, hence surgery was consulted. She denied any smoking or drinking. She does not use any illicit medications. Primary team plan to use a temporary line for dialysis while the AVG site resolves.       Past Medical History:  Past Medical History:   Diagnosis Date    Abnormal mammogram     Abnormal Pap smear of cervix     Allergic     Anemia     Anxiety     Arthritis     Asthma     Back pain     Cancer (CMS-HCC) Ovarian    Depression     Eczema     ESRD (end stage renal disease) on dialysis (CMS-HCC)     FSGS (focal segmental glomerulosclerosis) 1997    renal biopsy at [facility]    GERD (gastroesophageal reflux disease)     Gout     H/O adenoidectomy     had adenoids removed    Hypercholesteremia     Hypertension     Keloid     Ovarian cancer (CMS-HCC)     Pancreatitis     Psoriasis     QT prolongation 06/04/2023    Seizure (CMS-HCC)     unknown etiology; none for several years    Sepsis (CMS-HCC) 09/16/2022    Stroke (  CMS-HCC) 2000    Tachycardia 07/31/2020       Past Surgical History:   Past Surgical History:   Procedure Laterality Date    APPENDECTOMY      BREAST EXCISIONAL BIOPSY Right 11/28/2016    CHEMOTHERAPY      for ovarian CA in 2000    CHG US GUIDE, VASCULAR ACCESS N/A 01/29/2020    Procedure: ULTRASOUND GUIDANCE FOR VASC ACCESS REQUIRING [place] EVAL OF POTENTIAL ACCESS SITES;  Surgeon: Leona Carry, MD;  Location: MAIN OR [facility];  Service: Transplant    CHG US GUIDE, VASCULAR ACCESS N/A 06/08/2020    Procedure: ULTRASOUND GUIDANCE FOR VASC ACCESS REQUIRING [place] EVAL OF POTENTIAL ACCESS SITES;  Surgeon: Leona Carry, MD;  Location: MAIN OR [facility];  Service: Transplant    CHOLECYSTECTOMY      COMBINED HYSTEROSCOPY DIAGNOSTIC / D&C  07/07/2015    Planned for endometrial ablation, but patient had uterine perforation after D&C and unable to perform    HYSTERECTOMY      LEFT OOPHORECTOMY Left 04/12/2000    Removed with L fallopian tube for ectopic pregnancy OOPHORECTOMY      PR ANASTOMOSIS,AV,ANY SITE Right 08/15/2023    Procedure: ARTERIOVENOUS ANASTOMOSIS, OPEN; DIRECT, ANY SITE, UPPER EXTREMITY;  Surgeon: Dyann Ruddle, MD;  Location: [facility] OR [facility];  Service: General Surgery    PR COLONOSCOPY W/BIOPSY SINGLE/MULTIPLE  09/12/2021    Procedure: COLONOSCOPY, FLEXIBLE, PROXIMAL TO SPLENIC FLEXURE; WITH BIOPSY, SINGLE OR MULTIPLE;  Surgeon: Maris Berger, MD;  Location: [facility];  Service: Gastroenterology    PR COLSC FLX W/RMVL OF TUMOR POLYP LESION SNARE TQ N/A 09/12/2021    Procedure: COLONOSCOPY FLEX; W/REMOV TUMOR/LES BY SNARE;  Surgeon: Maris Berger, MD;  Location: [facility];  Service: Gastroenterology    PR CREAT AV FISTULA,AUTOGENOUS GRAFT Left 01/29/2020    Procedure: Create Av Fistula (Separt Proc); Autog Gft;  Surgeon: Leona Carry, MD;  Location: MAIN OR [facility];  Service: Transplant    PR CREAT AV FISTULA,NON-AUTOGENOUS GRAFT Left 06/08/2020    Procedure: CREATE AV FISTULA (SEPARATE PROC); NONAUTOGENOUS GRAFT (EG, BIOLOGICAL COLLAGEN, THERMOPLASTIC GRAFT);  Surgeon: Leona Carry, MD;  Location: MAIN OR [facility];  Service: Transplant    PR EXPLORATION N/FLWD SURG UPPER EXTREMITY ARTERY Left 08/02/2020    Procedure: Exploration Not Followed By Surgical Repair, Artery; Upper Extremity (Eg, Axillary, Brachial, Radial, Ulnar);  Surgeon: Earney Mallet, MD;  Location: MAIN OR [facility];  Service: Vascular    PR INCIS/DRAIN ARM,DEEP ABSC/HEMATOMA Left 08/14/2020    Procedure: INCISION AND DRAINAGE, UPPER ARM OR ELBOW AREA; DEEP ABSCESS OR HEMATOMA;  Surgeon: Earney Mallet, MD;  Location: MAIN OR [place];  Service: Vascular    PR INSERTION TUNNEL INTRAPERITONEAL CATH DIAL OPEN Midline 02/01/2021    Procedure: INSERTION OF INTRAPERITONEAL CANNULA OR CATHETER FOR DRAINAGE OR DIALYSIS; PERMANENT;  Surgeon: Loney Hering, MD;  Location: MAIN OR [place];  Service: Transplant    PR REMV ART CLOT AXILL-BRACH,ARM INCIS Left 08/02/2020    Procedure: Embolect/Thrombec; Axilry/Brachial Art-Arm Incs;  Surgeon: Earney Mallet, MD;  Location: MAIN OR [facility];  Service: Vascular    PR UPPER GI ENDOSCOPY,BIOPSY N/A 08/29/2017    Procedure: UGI ENDOSCOPY; WITH BIOPSY, SINGLE OR MULTIPLE;  Surgeon: Neysa Hotter, MD;  Location: GI PROCEDURES [place] [facility];  Service: Gastroenterology    PR VEIN BYPASS GRAFT,SUBCL-BRACHIAL Left 08/05/2020    Procedure: Bypass Graft, With Vein; Subclavian-Brachial;  Surgeon: Earney Mallet, MD;  Location: MAIN OR [facility];  Service: Vascular    SALPINGECTOMY Left 04/12/2000    Ectopic pregnancy    SALPINGECTOMY Right 11/22/2015    at time of total vaginal hysterectomy    TONSILECTOMY, ADENOIDECTOMY, BILATERAL MYRINGOTOMY AND TUBES      TOTAL VAGINAL HYSTERECTOMY  11/22/2015    With R salpingectomy       Social History:  Social History     Socioeconomic History    Marital status: Married   Tobacco Use    Smoking status: Former     Current packs/day: 0.00     Average packs/day: 2.0 packs/day for 1 year (2.0 ttl pk-yrs)     Types: Cigarettes     Start date: 06/30/2009     Quit date: 06/30/2010     Years since quitting: 13.2    Smokeless tobacco: Never   Vaping Use    Vaping status: Never Used   Substance and Sexual Activity    Alcohol use: Not Currently    Drug use: Not Currently    Sexual activity: Yes     Partners: Male     Birth control/protection: Bilateral Tubal Ligation   Other Topics Concern    Do you use sunscreen? No    Tanning bed use? No    Are you easily burned? No    Excessive sun exposure? No    Blistering sunburns? No   Social History Narrative    on disability since 1994, but does not qualify anymore since she got married and her husband has income.     Lives in [place] with husband, Lisbeth Ply and 2 children     Social Drivers of Health     Financial Resource Strain: Low Risk  (06/26/2023)    Overall Financial Resource Strain (CARDIA)     Difficulty of Paying Living Expenses: Not hard at all   Food Insecurity: No Food Insecurity (06/26/2023)    Hunger Vital Sign     Worried About Running Out of Food in the Last Year: Never true     Ran Out of Food in the Last Year: Never true   Transportation Needs: No Transportation Needs (06/26/2023)    PRAPARE - Therapist, art (Medical): No     Lack of Transportation (Non-Medical): No       Family History:   Family History   Adopted: Yes   Problem Relation Age of Onset    Heart attack Mother 70    Alcohol abuse Mother     Mental illness Mother     No Known Problems Father     No Known Problems Sister     No Known Problems Daughter     No Known Problems Maternal Grandmother     No Known Problems Maternal Grandfather     No Known Problems Paternal Grandmother     No Known Problems Paternal Grandfather     No Known Problems Brother     No Known Problems Son     No Known Problems Maternal Aunt     No Known Problems Maternal Uncle     No Known Problems Paternal Aunt     No Known Problems Paternal Uncle     No Known Problems Other     Anesthesia problems Neg Hx     Broken bones Neg Hx     Cancer Neg Hx     Clotting disorder Neg Hx     Collagen disease Neg Hx  Diabetes Neg Hx     Dislocations Neg Hx     Fibromyalgia Neg Hx     Gout Neg Hx     Hemophilia Neg Hx     Osteoporosis Neg Hx     Rheumatologic disease Neg Hx     Scoliosis Neg Hx     Severe sprains Neg Hx     Sickle cell anemia Neg Hx     Spinal Compression Fracture Neg Hx     Melanoma Neg Hx     Basal cell carcinoma Neg Hx     Squamous cell carcinoma Neg Hx     Breast cancer Neg Hx        Medication:  No current facility-administered medications on file prior to encounter.     Current Outpatient Medications on File Prior to Encounter   Medication Sig Dispense Refill    albuterol HFA 90 mcg/actuation inhaler Inhale 2 puffs every six (6) hours as needed for wheezing. 8 g 3    apixaban (ELIQUIS) 2.5 mg Tab Take 1 tablet (2.5 mg total) by mouth two (2) times a day. 30 tablet 2    aspirin 81 MG chewable tablet Chew 1 tablet (81 mg total) daily. 30 tablet 1    beclomethasone dipropionate (QVAR REDIHALER) 80 mcg/actuation inhaler Inhale 1 puff  in the morning and 1 puff in the evening. 10.6 g 3    calcitriol (ROCALTROL) 0.5 MCG capsule Take 1 capsule (0.5 mcg total) by mouth daily. 30 capsule 2    cinacalcet (SENSIPAR) 60 MG tablet Take 1 tablet (60 mg total) by mouth daily.      [EXPIRED] doxycycline (VIBRAMYCIN) 100 MG capsule Take 1 capsule (100 mg total) by mouth two (2) times a day for 14 days. 28 capsule 0    ferric citrate (AURYXIA ORAL) Take 1 g by mouth Three (3) times a day.      furosemide (LASIX) 40 MG tablet Take 1 tablet up to 3 times weekly as needed for leg swelling. 12 tablet 2    gentamicin (GARAMYCIN) 0.1 % ointment APPLY TO EXIT SITE DAILY WITH DRESSING CHANGE      heparin sodium,porcine (HEPARIN, PORCINE,) 1,000 unit/mL 1000 unit/mL injection ADMINISTER 1 ML PER 1,000 ML OF PERITONEAL DIALYSIS SOLUTION AS DIRECTED 90 mL 0    heparin sodium,porcine/PF (HEP FLUSH-10, PF, IV) Heparin Sodium (Porcine) 1,000 Units/mL Catheter Lock Arterial      methoxy peg-epoetin beta (MIRCERA INJ) 100 mcg.      [EXPIRED] midodrine (PROAMATINE) 5 MG tablet Take 1 tablet (5 mg total) by mouth two (2) times a day as needed. (Take 1 tab after PD. Please check your BP each afternoon, take 1 tab for systolic pressure less than 90) 60 tablet 2    mirtazapine (REMERON) 15 MG tablet Take 1 tablet (15 mg total) by mouth nightly. 90 tablet 0    nystatin (MYCOSTATIN) 100,000 unit/gram powder Apply under breasts two times daily until resolution 60 g 1    secukinumab (COSENTYX PEN, 2 PENS,) 150 mg/mL PnIj injection Inject the contents of 2 pens (300 mg total) under the skin every twenty-eight (28) days. Maintenance dose. 6 mL 3    senna (SENOKOT) 8.6 mg tablet Take 2 tablets by mouth daily. 60 tablet 2    [EXPIRED] senna-docusate (PERICOLACE) 8.6-50 mg Take 2 tablets by mouth daily. 60 tablet 2    sevelamer (RENVELA) 800 mg tablet Take 3 tablets (2,400 mg total) by mouth Three (3) times a day with a  meal. 270 tablet 11    tizanidine (ZANAFLEX) 2 MG tablet Take 1 tablet (2 mg total) by mouth daily as needed. 90 tablet 0    triamcinolone (KENALOG) 0.1 % ointment Apply topically two (2) times a day as needed. Use on psoriasis until flat. 80 g 1       Allergies:  Allergies   Allergen Reactions    Cephalexin Itching     unknown; tolerates cefepime    Mango Itching    Peach (Prunus Persica) Itching    Wellbutrin [Bupropion Hcl] Other (See Comments)     Seizure    Egg Itching    Fish Containing Products Itching    Lactase-Rennet Nausea And Vomiting    Lexiscan [Regadenoson] Nausea And Vomiting     Patient had nausea after the Lexi then vomitted [id number]    Mushroom Itching       Review of systems:   Negative except as noted in the History of Present Illness.    Vital Signs:  BP 95/55  - Pulse 102  - Temp 36.6 ??C (97.9 ??F) (Oral)  - Resp 18  - Ht 157.5 cm (5' 2)  - Wt 82.4 kg (181 lb 9.6 oz)  - LMP  (LMP Unknown)  - SpO2 100%  - BMI 33.22 kg/m??     Physical Exam:  GENERAL: alert, active, in no acute distress  HEENT: Pupils equal, round, reactive to light and accommodation No scleral icterus, no proptosis, conjunctivae clear.    NECK: Supple, trachea in the midline, no JVD, no thyromegaly.  PULMONARY: NWOB on RA  CARDIOVASCULAR: Regular rate and rhythm, no murmurs, no peripheral edema.  ABDOMEN: soft, tender and redness observed in LLQ. Incision on LLQ without erythema or ecchymosis.  RUE: Redness and scratch marks noted in medial arm. No erythema around incision. 2+ radial pulse with good cap refill.   Rectal exam: deferred  NEURO: CN II-XII grossly intact.   PSYCH: oriented to time, place and person      Labs  Lab Results   Component Value Date    WBC 16.4 (H) 10/03/2023    HGB 10.5 (L) 10/03/2023    HCT 34.3 10/03/2023    PLT 192 10/03/2023       Lab Results   Component Value Date    NA 140 10/03/2023    K 4.5 10/03/2023    CL 103 10/03/2023    CO2 18.8 (L) 10/03/2023    BUN 35 (H) 10/03/2023    CREATININE 8.82 (H) 10/03/2023    GLU 98 10/03/2023    CALCIUM 8.1 (L) 10/03/2023    MG 1.6 10/03/2023    PHOS 6.1 (H) 10/03/2023       Lab Results   Component Value Date    BILITOT 0.2 (L) 10/02/2023    BILIDIR 0.30 08/11/2020    PROT 7.0 10/02/2023    ALBUMIN 3.2 (L) 10/02/2023    ALT 17 10/02/2023    AST 31 10/02/2023    ALKPHOS 95 10/02/2023       Lab Results   Component Value Date    PT 11.4 10/02/2023    INR 1.00 10/02/2023    APTT 30.8 03/23/2022         Imaging  PVL Hemodialysis Graft/Fistula Duplex  Result Date: 10/03/2023    Peripheral Vascular Lab     [address]   [place], [place] [address]  PVL HEMODIALYSIS GRAFT-FISTULA DUPLEX Patient Demographics Pt. Name: Megan Robertson Location: [place] [facility] MRN:  [id number]          Sex:      F DOB:      05/20/1971        Age:      2 years  Study Information Authorizing         [id number] Lyn Records [facility] Performed Time       10/03/2023 6:54:32 Provider Name                                                 AM Ordering Physician  Lyn [facility]        Patient Location     [facility] Accession Number    [id number] UN       Technologist         Marissa Pachlhofer                                                               RVT Diagnosis:                               Assisting                                          Technologist Ordered Reason For Exam: concern for infected fistula  Reason For Exam = concern for infected AVG; pain along the AVG. Access site = Right Upper Extremity. Access Type = brachioaxillary arc AVG. Right brachioaxillary arc AVG creation 08/15/2023.  Final Interpretation Patent AVG _ Non-vascular structure adjacent to the arterial anastomosis (see below).  Electronically signed by [id number] Jodell Cipro MD on 10/03/2023 at 8:30:01 AM.  Examination Protocol: The dialysis access is interrogated using Doppler ultrasound. B-mode imaging and color Doppler are used to evaluate for diameters, depths, velocities, flow volumes, and abnormalities.When arterial steal is suspected by ordering physician, PW Doppler signals are evaluated at the wrist and PPG tracings are obtained from the digits. In the presence of retrograde flow at the wrist or nonpulsatile PPG tracings, dialysis access may be momentarily occluded to evaluate hemodynamic changes. In the presence of inadaquate fistula maturation, the afferant arteries and efferent veins are evaluated for obstruction. In the presence of suspected venous hypertension, the central veins are evaluated for stenosis.  Findings: +--------------------+--------+-------+--------+-----------+ -                    -Diameter-Depth  -PSV     -Flow Volume- +--------------------+--------+-------+--------+-----------+ -brachial artery     -5.0 mm  -28.4 mm-143 cm/s-634 ml/min - +--------------------+--------+-------+--------+-----------+ -arterial anastomosis-2.9 mm  -20.4 mm-257 cm/s-           - +--------------------+--------+-------+--------+-----------+ -AVG @ DUA           -6.1 mm  -5.9 mm -187 cm/s-660 ml/min - +--------------------+--------+-------+--------+-----------+ -AVG @ MUA           -6.1 mm  -4.8 mm -76 cm/s -402 ml/min - +--------------------+--------+-------+--------+-----------+ -AVG @ PUA           -4.4 mm  -9.4  mm -131 cm/s-580 ml/min - +--------------------+--------+-------+--------+-----------+ -venous anastomosis  -2.9 mm  -28.4 mm-175 cm/s-           - +--------------------+--------+-------+--------+-----------+ -axillary vein       -5.9 mm  -34.6 mm-45 cm/s -           - +--------------------+--------+-------+--------+-----------+  The right brachioaxillary AVG appears patent throughout. There appears to be a non-vascularized mixed echogenic area adjacent to the arterial anastomosis (see images 28-32). See above for AVG measurements and flow volumes.   Final     CT Abdomen Pelvis Wo Contrast  Result Date: 10/02/2023  EXAM: CT ABDOMEN PELVIS WO CONTRAST ACCESSION: [id number] UN REPORT DATE: 10/02/2023 6:57 PM CLINICAL INDICATION: 52 years old with Recent removal of peritoneal dialysis cath with abdominal tenderness, warmth, and mild erythema.      COMPARISON: 02/27/2021.     TECHNIQUE: A spiral CT scan was obtained without IV contrast from the lung bases to the pubic symphysis.  Images were reconstructed in the axial plane. Coronal and sagittal reformatted images were also provided for further evaluation.     Evaluation of the solid organs and vasculature is limited in the absence of intravenous contrast.         FINDINGS:     LOWER CHEST: Small pericardial effusion.     LIVER: Unremarkable noncontrast appearance of the liver.     BILIARY: Status post cholecystectomy. No intrahepatic biliary ductal dilatation.     SPLEEN: Normal in size and contour.     PANCREAS: Normal pancreatic contour without signs of inflammation or gross ductal dilatation.     ADRENAL GLANDS: Thickening of the adrenal glands. Ido     KIDNEYS/URETERS: Small atrophic kidneys. No nephrolithiasis.  No ureteral dilatation or collecting system distention.     BLADDER: Unremarkable.     REPRODUCTIVE ORGANS: Status post hysterectomy.     GI TRACT: The stomach appears unremarkable. The loops of small bowel are normal in caliber. No evidence of acute colonic pathology. The appendix appears unremarkable.     PERITONEUM/RETROPERITONEUM AND MESENTERY: Small mount of free air in the abdomen. No ascites. No fluid collection.     VASCULATURE: Normal caliber aorta. Trace calcified atherosclerotic disease.     LYMPH NODES: Prominent retroperitoneal lymph nodes.     BONES and SOFT TISSUES: Degenerative changes of the spine. Small fat-containing umbilical hernia. Soft tissue gas and stranding in the left ventral body wall at the site of prior peritoneal dialysis catheter. There is a focus of high attenuation (series 2#85)             1. Small-moderate free air in the abdomen, presumably related to the recent removal of the peritoneal dialysis catheter.     2. Soft tissue stranding with locules of gas in the left ventral body wall at the site of prior dialysis catheter. Note is made of a focus of high attenuation along the superficial margin of the rectus muscle which could represent active extravasation. There is overlying skin thickening which can be seen with cellulitis. No discrete drainable fluid collection is identified.     3. Additional findings, as described in the body of the report.     The findings were discussed with PA Baker by Dr. Lane Hacker at 19: 05 on 10/02/2023.     Teaching Surgeon Attestation:  I, Dyann Ruddle, M.D., saw and evaluated the patient. I have reviewed and edited the above note, and I agree with the findings and the plan of care as documented in  the Resident's note. Noted that WBC had risen to 16K . Ultrasound findings noted. Will obtain CT scan for additional imaging.

## 2023-10-03 NOTE — Unmapped (Signed)
WOCN Consult Services                                                                 Wound Evaluation     Reason for Consult:   - Initial  - Wound    Problem List:   Principal Problem:    Cellulitis  Active Problems:    OSA (obstructive sleep apnea)    ESRD (end stage renal disease) on dialysis (CMS-HCC)    Assessment:   Megan Robertson is a 52 y.o. female with a past medical history significant for ESRD who presents with concern for cellulitis     RN consult received for wounds on neck and abdomen.    Nephrology saw pt on 12/10 and removed peritoneal dialysis catheter as well as right internal jugular vein catheter.    In their 12/10 notes they made recommendations for care of the dressings and when they should be removed.    RN and Family Medicine made aware.    WOCN will defer to Nephrology regarding care of these dressings.    WOCN will sign off for now.    Please reconsult if WOCN team is needed in the care of this patient.           Lab Results   Component Value Date    WBC 16.4 (H) 10/03/2023    HGB 10.5 (L) 10/03/2023    HCT 34.3 10/03/2023    ESR 97 (H) 06/03/2023    CRP 61.0 (H) 06/03/2023    A1C 4.7 (L) 10/24/2020    GLU 98 10/03/2023    POCGLU 88 08/15/2023    ALBUMIN 3.2 (L) 10/02/2023    PROT 7.0 10/02/2023       TWOCN Follow Up:  - We will sign off at this time    Plan of Care Discussed With:   - RN EPIC chat  - LIP EPIC chat    Workup Time:   30 minutes    Tonny Bollman, BSN, RN, [facility]  Wound Ostomy Consult Service

## 2023-10-03 NOTE — Unmapped (Signed)
Pt alert and oriented x4. 1 assist with walker. IV abx as scheduled. Lost IV access overnight. US guided access located by rounding nurse. Save R arm for AV fistula. Gauze covered with tegaderm noted on R neck and mid lower ABD from removed of PD and temp cath. Pt stated dressings are not to be removed until 12/20. Dressings not removed. WOCN consult placed. Bed locked and in lowest position. Call bell in reach.        Problem: Adult Inpatient Plan of Care  Goal: Plan of Care Review  Outcome: Ongoing - Unchanged  Goal: Patient-Specific Goal (Individualized)  Outcome: Ongoing - Unchanged  Goal: Absence of Hospital-Acquired Illness or Injury  Outcome: Ongoing - Unchanged  Intervention: Identify and Manage Fall Risk  Recent Flowsheet Documentation  Taken 10/02/2023 2200 by Chinita Pester, RN  Safety Interventions: fall reduction program maintained  Goal: Optimal Comfort and Wellbeing  Outcome: Ongoing - Unchanged  Goal: Readiness for Transition of Care  Outcome: Ongoing - Unchanged  Goal: Rounds/Family Conference  Outcome: Ongoing - Unchanged

## 2023-10-03 NOTE — Unmapped (Signed)
Family Medicine Inpatient Service  Progress Note    Team: Family Medicine [facility] Si (pgr [phone])    Hospital Day: 0    ASSESSMENT / PLAN:   Megan Robertson is a 52 y.o. female with a past medical history significant for ESRD, anxiety, and chronic pain, who presented to [facility] ED on 12/11 on the advice of her nephrologist with concern for cellulitis of right arm over her fistula site, also reports abdominal pain from recent peritoneal dialysis catheter removal site (removed 12/10).     # Cellulitis  Presented with erythema, edema, and pain over right AV fistula site. Additionally has abdominal pain with warmth near PD removal site. Sent by Nephrologist at dialysis for concern for AVG site cellulitis and need for antibiotics and temporary line. Right AVG site placed by General Surgery 08/15/2023 due to poor response with peritoneal dialysis. PD catheter and right internal jugular catheter were both removed on 12/10 without complication. Since removal patient has noted increased pain and warmth to abdomen over prior site. State pain at her right AVG site is at baseline. CTAP with soft tissue stranding with locules of gas and skin thickening, consistent with cellulitis over the left ventral region, also notes area of high attenuation along superficial margin of rectus muscle which could represent active extravasation. S/p CTX, vancomycin, and zosyn in ED for concern for peritoneal infection. Initial labs without leukocytosis, but WBC elevated at 16.4 today. Afebrile with intermittent tachycardia, but otherwise VSS. On exam, both prior sites are bandaged and c/d/I, some increased erythema to skin diffusely (arms, abdomen, chest), tenderness to palpation of abdomen LLQ>rest, no evidence of purulence, induration at right AVG site or prior PD site.  Per Nephrologist that examined her 12/11, right AVG site was much more erythematous and concerning for overlying cellulitis. Will plan to achieve alternative HD access with temporary line and complete antibiotics for cellulitis.   - Nephrology consulted, recommendations appreciated  - General Surgery consulted, recommendations appreciated  - VIR consulted, recommendations appreciated  - Continue IV ceftriaxone 1 g daily    - Consider switching to Augmentin in future  - Blood culture (12/11): pending  - MRSA screen: ordered  - Daily CBC    # ESRD on hemodialysis   HD on MWF, last dialyzed Monday (12/9). Did not receive dialysis 12/11 due to concern for cellulitis of right AVG as above. Cr 7.98 on admission, 8.82 today (12/12). No need for emergent HD at this time. Plan to place temporary line as above.  - Nephrology consulted, recommendations appreciated  - VIR consulted, recommendations appreciated  - Daily BMP, phos, mg  - Continue sevelamer 2400 mg PO TID with meals   - Home ferric citrate not on formulary, will work towards getting approval if longer stay  - Continue calcitriol 0.5 mcg daily and cinacalcet 60 mg daily  - Strict I&Os    # Hx of Normocytic Anemia   Hgb 11.6 on admission; down to 10.5 today (12/12). Receives Epo with dialysis   - Daily CBC    # Itching  Patient reports body itching since last night, improves with hydroxyzine. Does report itching as a reaction to antibiotics in the past  - Hydroxyzine 25 mg PO q6h PRN  - Nystatin topical BID under breasts     # Hx of clotted AVF   06/2023 had an AVF of the left brachiocephalic vein. PVL 12/12 suggests the right brachioaxillary AVG is patent throughout, demonstrates a non-vascularized mixed echogenic area adjacent to the arterial  anastomosis.   - Continue home Eliquis 2.5 mg      # PPX   - DVT: Continue home anticoagulation with Eliquis    Chronic and Stable Conditions:  # Pulmonary   - Albuterol q6h PRN  - Anuity Ellipta 100 mcg, 1 puff, daily  - CPAP    # Anxiety  - Mirtazapine 15 mg, PO, nightly      # Checklist  - IVF: 1L LR bolus over 4 hours  - Daily labs needed: CBC, BMP, Magnesium, and Phosphorus  - Currently NPO; once diet resumes - renal diet  - Bowel Regimen: Senna 2 tabs qHS  - DVT: Continue home anticoagulation with Eliquis  - Code Status:   Orders Placed This Encounter   Procedures    Full Code     Standing Status:   Standing     Number of Occurrences:   1     - Dispo: Floor    [ ]  Anticipated Discharge Location: Home  [ ]  PT/OT/DME: No needs anticipated  [ ]  CM/SW needs: None anticipated  [ ]  Follow up appt:  TBD    SUBJECTIVE:  Interval events: Admitted last night. This morning, reports itching all over that she believes is a response to a medication administered since admission (has documentation of previous similar response to cephalexin). Reports urinating this morning with normal urine. Reports that her AV site is painful, but same as it has been since it was placed. Continues to have abdominal discomfort since recent PD removal, stable from day of procedure.     REVIEW OF SYSTEMS:  Pertinent positives and negatives per HPI. A complete review of systems otherwise negative.    PHYSICAL EXAM:      Intake/Output Summary (Last 24 hours) at 10/03/2023 1134  Last data filed at 10/02/2023 2015  Gross per 24 hour   Intake 200 ml   Output --   Net 200 ml       Recent Vitals:  Vitals:    10/03/23 0925   BP: 95/55   Pulse: 102   Resp: 18   Temp: 36.6 ??C (97.9 ??F)   SpO2: 100%       GEN: well appearing, inclined in bed, NAD  HEENT: NCAT, No scleral icterus. Conjunctiva non-erythematous. MMM.  CV: Regular rate and rhythm. No murmurs/rubs/gallops.  Pulm: Normal work of breathing on room air. CTAB. No wheezing, crackles, or rhonchi.  Abd: Flat.  Pain on palpation of left side of abdomen, greatest in LLQ.  No rebound tenderness or peritoneal signs. Bandage c/d/I. Normoactive bowel sounds  Neuro: A&O x 3. No focal deficits.  Ext: No peripheral edema.  Palpable distal pulses. Pain on palpation of right upper arm.   Skin: mild erythema to whole body area      LABS/ STUDIES:    All imaging, laboratory studies, and other pertinent tests including electrocardiography within the last 24 hours were reviewed and are summarized within the assessment and plan.     NUTRITION:                           [place]  [place], MS3     I attest that I have reviewed the student note and that the components of the history of the present illness, the physical exam, and the assessment and plan documented were performed by me or were performed in my presence by the student where I verified the documentation and performed (or re-performed) the  exam and medical decision making.     Cherie Dark, MD  Resident Physician Family Medicine, PGY-2

## 2023-10-04 LAB — BASIC METABOLIC PANEL
ANION GAP: 17 mmol/L — ABNORMAL HIGH (ref 5–14)
BLOOD UREA NITROGEN: 29 mg/dL — ABNORMAL HIGH (ref 9–23)
BUN / CREAT RATIO: 3
CALCIUM: 7.7 mg/dL — ABNORMAL LOW (ref 8.7–10.4)
CHLORIDE: 107 mmol/L (ref 98–107)
CO2: 16.9 mmol/L — ABNORMAL LOW (ref 20.0–31.0)
CREATININE: 10.31 mg/dL — ABNORMAL HIGH (ref 0.55–1.02)
EGFR CKD-EPI (2021) FEMALE: 4 mL/min/{1.73_m2} — ABNORMAL LOW (ref >=60)
GLUCOSE RANDOM: 81 mg/dL (ref 70–179)
POTASSIUM: 4.3 mmol/L (ref 3.4–4.8)
SODIUM: 141 mmol/L (ref 135–145)

## 2023-10-04 LAB — CBC
HEMATOCRIT: 31.7 % — ABNORMAL LOW (ref 34.0–44.0)
HEMOGLOBIN: 10 g/dL — ABNORMAL LOW (ref 11.3–14.9)
MEAN CORPUSCULAR HEMOGLOBIN CONC: 31.5 g/dL — ABNORMAL LOW (ref 32.0–36.0)
MEAN CORPUSCULAR HEMOGLOBIN: 24.8 pg — ABNORMAL LOW (ref 25.9–32.4)
MEAN CORPUSCULAR VOLUME: 78.8 fL (ref 77.6–95.7)
MEAN PLATELET VOLUME: 7.7 fL (ref 6.8–10.7)
PLATELET COUNT: 213 10*9/L (ref 150–450)
RED BLOOD CELL COUNT: 4.02 10*12/L (ref 3.95–5.13)
RED CELL DISTRIBUTION WIDTH: 19.8 % — ABNORMAL HIGH (ref 12.2–15.2)
WBC ADJUSTED: 11 10*9/L (ref 3.6–11.2)

## 2023-10-04 LAB — MAGNESIUM: MAGNESIUM: 1.8 mg/dL (ref 1.6–2.6)

## 2023-10-04 LAB — VANCOMYCIN, RANDOM: VANCOMYCIN RANDOM: 41.4 ug/mL

## 2023-10-04 LAB — PHOSPHORUS: PHOSPHORUS: 5.4 mg/dL — ABNORMAL HIGH (ref 2.4–5.1)

## 2023-10-04 MED ADMIN — sevelamer (RENVELA) tablet 2,400 mg: 2400 mg | ORAL | @ 18:00:00

## 2023-10-04 MED ADMIN — gentamicin-sodium citrate lock solution in NS: 1.4 mL | @ 20:00:00

## 2023-10-04 MED ADMIN — sevelamer (RENVELA) tablet 2,400 mg: 2400 mg | ORAL | @ 15:00:00

## 2023-10-04 MED ADMIN — sevelamer (RENVELA) tablet 2,400 mg: 2400 mg | ORAL | @ 23:00:00

## 2023-10-04 MED ADMIN — heparin (porcine) 1000 unit/mL injection 1,000 Units: 1000 [IU] | INTRAVENOUS | @ 21:00:00

## 2023-10-04 MED ADMIN — mirtazapine (REMERON) tablet 15 mg: 15 mg | ORAL | @ 02:00:00

## 2023-10-04 MED ADMIN — cefTRIAXone (ROCEPHIN) 1 g in sodium chloride 0.9 % (NS) 100 mL IVPB-MBP: 1 g | INTRAVENOUS | @ 11:00:00 | Stop: 2023-10-04

## 2023-10-04 MED ADMIN — apixaban (ELIQUIS) tablet 2.5 mg: 2.5 mg | ORAL | @ 02:00:00

## 2023-10-04 MED ADMIN — hydrOXYzine (ATARAX) tablet 25 mg: 25 mg | ORAL | @ 02:00:00

## 2023-10-04 MED ADMIN — cinacalcet (SENSIPAR) tablet 60 mg: 60 mg | ORAL | @ 15:00:00

## 2023-10-04 MED ADMIN — calcitriol (ROCALTROL) capsule 0.5 mcg: .5 ug | ORAL | @ 15:00:00

## 2023-10-04 MED ADMIN — heparin (porcine) 1000 unit/mL injection 1,000 Units: 1000 [IU] | INTRAVENOUS | @ 19:00:00

## 2023-10-04 MED ADMIN — oxyCODONE (ROXICODONE) immediate release tablet 2.5 mg: 2.5 mg | ORAL | @ 02:00:00 | Stop: 2023-10-03

## 2023-10-04 MED ADMIN — apixaban (ELIQUIS) tablet 2.5 mg: 2.5 mg | ORAL | @ 15:00:00

## 2023-10-04 MED ADMIN — aspirin chewable tablet 81 mg: 81 mg | ORAL | @ 15:00:00

## 2023-10-04 MED ADMIN — nystatin (MYCOSTATIN) powder: TOPICAL | @ 15:00:00

## 2023-10-04 MED ADMIN — fluticasone furoate (ARNUITY ELLIPTA) 100 mcg/actuation inhaler 1 puff: 1 | RESPIRATORY_TRACT | @ 13:00:00

## 2023-10-04 NOTE — Unmapped (Signed)
Patient came from VIR, unable to dialyze d/t arterial limb not pulling and sluggish pushing. Nephrologist and VIR notified, CVC closed and patient returned to her room

## 2023-10-04 NOTE — Unmapped (Signed)
Colorectal Surgery Progress Note    Today's Date: 10/04/2023  Admission Date: 10/02/2023  Length of Stay: Day 0  Hospital Service: Family Medicine [facility])  Attending Physician: Al Corpus, MD     Assessment   Principal Problem:    Cellulitis  Active Problems:    OSA (obstructive sleep apnea)    ESRD (end stage renal disease) on dialysis (CMS-HCC)    Megan Robertson is a 52 yo F with a PMH of ESRD who underwent creation of right brachioaxillary arteriovenous arc graft 08/15/23. She is also 3 days out from removal of her intraperitoneal dialysis cath with IR on 10/01/23. The patient was admitted due to concern for cellulitis in the Right arm around AVG site, and infection in the abdomen, hence surgery was consulted. She received a temp L internal jugular trialysis cathether for dialysis while the AVG site possible infection resolves, but was unable to get dialysis 10/03/23 due to a mechanical issue.     Subjective   NAEO, VSS, AF. Patient reports decreased itching around RUE but still tender. Abdominal tenderness also mildly improved.     Plan   - Obtain  a CT of the RUE to evaluate for possible AVG infection  - No surgical intervention indicated at this time.   - Continue IV abx  - OK to repeat dialysis with temp internal jugular line.   - We will continue to monitor patient's status. Please page [phone] with any questions or concerns    Objective   Vital signs over the last 24 hours:  Temp:  [36.6 ??C (97.9 ??F)-36.9 ??C (98.4 ??F)] 36.8 ??C (98.2 ??F)  Heart Rate:  [98-107] 99  Resp:  [17-30] 17  BP: (73-139)/(19-85) 113/69  MAP (mmHg):  [66-83] 83  SpO2:  [98 %-100 %] 100 %    Wt Readings from Last 5 Encounters:   10/02/23 82.4 kg (181 lb 9.6 oz)   09/06/23 80.9 kg (178 lb 6.4 oz)   09/04/23 81.8 kg (180 lb 4.8 oz)   08/15/23 81.3 kg (179 lb 3.2 oz)   07/17/23 82.1 kg (181 lb)      Physical Exam:  GENERAL: alert, active, in no acute distress  HEENT: Pupils equal, round, reactive to light and accommodation No scleral icterus, no proptosis, conjunctivae clear.    NECK: Supple, trachea in the midline, no JVD, no thyromegaly.  PULMONARY: NWOB on RA  CARDIOVASCULAR: Regular rate and rhythm, no murmurs, no peripheral edema.  ABDOMEN: soft, tender and redness observed in LLQ. Incision on LLQ without erythema or ecchymosis.  RUE: Physical findings now more focal at the level of the distal medial upper arm near arterial inflow. Swelling and erythema, no skin changes  NEURO: CN II-XII grossly intact.   PSYCH: oriented to time, place and person

## 2023-10-04 NOTE — Unmapped (Signed)
Family Medicine Inpatient Service  Progress Note    Team: Family Medicine [facility] Si (pgr [phone])    Hospital Day: 0    ASSESSMENT / PLAN:   Megan Robertson is a 52 y.o. female with a past medical history significant for ESRD, anxiety, and chronic pain, who presented to [facility] ED on 12/11 on the advice of her nephrologist with concern for cellulitis of right arm over her fistula site, also reports abdominal pain from recent peritoneal dialysis catheter removal site (removed 12/10).     # Cellulitis  Presented with erythema, edema, and pain over right AV fistula site and abdominal pain and warmth near PD removal site, Nephrologist concerned for AVG site cellulitis. Right AVG site placed by General Surgery 08/15/2023 due to poor response with peritoneal dialysis. PD catheter and right internal jugular catheter were both removed on 12/10 without complication. States pain at her right AVG site is at baseline. CTAP with soft tissue stranding with locules of gas and skin thickening, consistent with cellulitis over the left ventral region, also notes area of high attenuation along superficial margin of rectus muscle which could represent active extravasation. S/p CTX, vancomycin, and zosyn in ED for concern for peritoneal infection. Afebrile with intermittent tachycardia, but otherwise VSS. On exam, both prior sites are bandaged and c/d/I, some increased erythema to skin diffusely (arms, abdomen, chest), tenderness to palpation of abdomen LLQ>rest, no evidence of purulence or induration at right AVG site or prior PD site.  Per Nephrologist that examined her 12/11, right AVG site was much more erythematous and concerning for overlying cellulitis. Non-purulent and MRSA screen negative. S/p temporary LIJ non-tunneled trialysis with VIR on 12/12 PM.  - Nephrology consulted, recommendations appreciated  - General Surgery consulted, recommendations appreciated   - No surgical intervention, agree with resting AVG   - VIR consulted, recommendations appreciated   - No tunneled HD line until blood cultures NGTD for 48 hours  - Transition from IV ceftriaxone 1 g daily to Linezolid 600 mg q12h to complete 14 day course (12/10-)   - Order random vancomycin level (has received total 3 g since prior dialysis)   - Start linezolid pending vancomycin level  - Blood culture (12/11): NG at 24 hours  - Daily CBC    # ESRD on hemodialysis   # Hyperphosphatemia - Secondary Hyperparathyroidism  HD on MWF, last dialyzed Monday (12/9). Did not receive dialysis 12/11 due to concern for cellulitis of right AVG as above. Cr 7.98 on admission, 8.82 today (12/12). No need for emergent HD at this time. Plan to place temporary line as above.  - Nephrology consulted, recommendations appreciated  - VIR consulted, recommendations appreciated  - Daily BMP, Phos, Mg  - Continue sevelamer 2400 mg PO TID with meals   - Home ferric citrate not on formulary, will work towards getting approval if longer stay  - Continue calcitriol 0.5 mcg daily and cinacalcet 60 mg daily  - Strict I&Os    # Hx of Normocytic Anemia   Hgb 11.6 on admission; down to 10.5 today (12/12). Receives Epo with dialysis   - Daily CBC    # Itching   # Eczema  Patient reports body itching since last night, improves with hydroxyzine. Does report itching as a reaction to antibiotics in the past. Does have eczema on bilateral upper thighs at baseline  - Hydroxyzine 25 mg PO q6h PRN  - Start topical lotion  - Nystatin topical BID under breasts     #  Hx of clotted AVF   06/2023 had an AVF of the left brachiocephalic vein. PVL 12/12 suggests the right brachioaxillary AVG is patent throughout, demonstrates a non-vascularized mixed echogenic area adjacent to the arterial anastomosis.   - Continue home Eliquis 2.5 mg   [ ]  OP consideration of whether still needs both ASA and eliquis      Chronic and Stable Conditions:  # Asthma: Continue home albuterol prn, Anuity Ellipta 100 mcg daily, CPAP nightly   # Anxiety: Continue home Mirtazapine 15 mg, PO, nightly      # Checklist  - IVF: None  - Daily labs needed: CBC, BMP, Magnesium, and Phosphorus  - Currently Renal diet  - Bowel Regimen: Senna 2 tabs qHS  - DVT: Continue home anticoagulation with Eliquis  - Code Status:   Orders Placed This Encounter   Procedures    Full Code     Standing Status:   Standing     Number of Occurrences:   1     - Dispo: Floor    [ ]  Anticipated Discharge Location: Home  [ ]  PT/OT/DME: No needs anticipated  [ ]  CM/SW needs:  Continued assistance for Medicaid/Medicare needs  [ ]  Follow up appt:  Needs PCP, has General Surgery 12/17    SUBJECTIVE:  Interval events: NAEON. Feels generally well this morning. Line placement was ok, no complaints. Continues to have itching, does have history of eczema on her low back. States her skin is at her baseline, does not feel she looks very red. Reports AVG site is at its baseline since placement, which is somewhat swollen and red. Abdominal pain from PD removal site is stable.     REVIEW OF SYSTEMS:  Pertinent positives and negatives per HPI. A complete review of systems otherwise negative.    PHYSICAL EXAM:    No intake or output data in the 24 hours ending 10/04/23 0604      Recent Vitals:  Vitals:    10/03/23 1930   BP: 96/52   Pulse: 100   Resp: 18   Temp: 36.8 ??C (98.2 ??F)   SpO2: 98%       GEN: well appearing, inclined in bed, NAD  HEENT: NCAT, No scleral icterus. Conjunctiva non-erythematous. MMM.  CV: Regular rate and rhythm. No murmurs/rubs/gallops. Prior tunneled line bandage c/d/I, new LIJ trialysis line c/d/I without erythema, edema, or purulence.   Pulm: Normal work of breathing on room air. CTAB. No wheezing, crackles, or rhonchi.  Abd: Flat.  Tender to palpation LLQ>LUQ.  No rebound tenderness or peritoneal signs. Bandage c/d/I. Normoactive bowel sounds  Neuro: A&O x 3. No focal deficits.  Ext: No peripheral edema.  Palpable distal pulses. Tenderness to palpation of RUE near AVG site.  Skin: mild erythema to bilateral upper arms, chest, abdomen, thighs      LABS/ STUDIES:    All imaging, laboratory studies, and other pertinent tests including electrocardiography within the last 24 hours were reviewed and are summarized within the assessment and plan.     NUTRITION:                           Cherie Dark, MD  Resident Physician Family Medicine, PGY-2

## 2023-10-04 NOTE — Unmapped (Signed)
Patient is A.O. x4. Unable to do dialysis today due to left internal jugular catheter not working. Patient came back to her room from dialysis at 1616. VSS. No c/c from the patient. In bed with call light in reach.    Problem: Adult Inpatient Plan of Care  Goal: Plan of Care Review  Outcome: Progressing  Goal: Patient-Specific Goal (Individualized)  Outcome: Progressing  Goal: Absence of Hospital-Acquired Illness or Injury  Outcome: Progressing  Intervention: Identify and Manage Fall Risk  Recent Flowsheet Documentation  Taken 10/03/2023 1600 by Alvino Chapel, RN  Safety Interventions: fall reduction program maintained  Taken 10/03/2023 1400 by Alvino Chapel, RN  Safety Interventions: fall reduction program maintained  Goal: Optimal Comfort and Wellbeing  Outcome: Progressing  Goal: Readiness for Transition of Care  Outcome: Progressing  Goal: Rounds/Family Conference  Outcome: Progressing     Problem: Fall Injury Risk  Goal: Absence of Fall and Fall-Related Injury  Outcome: Progressing  Intervention: Promote Injury-Free Environment  Recent Flowsheet Documentation  Taken 10/03/2023 1600 by Alvino Chapel, RN  Safety Interventions: fall reduction program maintained  Taken 10/03/2023 1400 by Alvino Chapel, RN  Safety Interventions: fall reduction program maintained     Problem: Wound  Goal: Optimal Coping  Outcome: Progressing  Goal: Optimal Functional Ability  Outcome: Progressing  Goal: Absence of Infection Signs and Symptoms  Outcome: Progressing  Goal: Improved Oral Intake  Outcome: Progressing  Goal: Optimal Pain Control and Function  Outcome: Progressing  Goal: Skin Health and Integrity  Outcome: Progressing  Goal: Optimal Wound Healing  Outcome: Progressing

## 2023-10-04 NOTE — Unmapped (Signed)
VIR Brief Treatment Plan Note    Patient is s/p placement of a LIJ Trialysis catheter yesterday with VIR. Unable to dialyze yesterday due to sluggish flow. CXR obtained which shows appropriately positioned LIJ CVC with likely a kink in the catheter at the venotomy insertion site.     Patient seen at bedside. Sutures removed and catheter retracted 0.5cm to straighten out kink. The catheter straightened and resutured. This was all done in a sterile fashion and new dressing applied. If there is still sluggish flow at dialysis today, recommend tPA. This was relayed to primary team.

## 2023-10-04 NOTE — Unmapped (Signed)
Pt offered cpap overnight and pt stated she did not want to wear tonight.  VSS on RA.

## 2023-10-04 NOTE — Unmapped (Signed)
Pt is alert and oriented x4. Vital signs stable. Complained of pain in the neck, received one time dose of oxycodone. Pt was drowsy and slept throughout the night. 1x assist with walker. On room air. Fall precautions maintained. Call bell in reach, plan of care ongoing.   Problem: Adult Inpatient Plan of Care  Goal: Plan of Care Review  Outcome: Ongoing - Unchanged  Goal: Patient-Specific Goal (Individualized)  Outcome: Ongoing - Unchanged  Goal: Absence of Hospital-Acquired Illness or Injury  Outcome: Ongoing - Unchanged  Intervention: Identify and Manage Fall Risk  Recent Flowsheet Documentation  Taken 10/04/2023 0400 by Kathyrn Sheriff, RN  Safety Interventions:   fall reduction program maintained   low bed  Taken 10/04/2023 0200 by Kathyrn Sheriff, RN  Safety Interventions:   fall reduction program maintained   low bed  Taken 10/04/2023 0000 by Kathyrn Sheriff, RN  Safety Interventions:   fall reduction program maintained   low bed  Taken 10/03/2023 2200 by Kathyrn Sheriff, RN  Safety Interventions:   fall reduction program maintained   low bed  Taken 10/03/2023 2000 by Kathyrn Sheriff, RN  Safety Interventions:   fall reduction program maintained   low bed  Goal: Optimal Comfort and Wellbeing  Outcome: Ongoing - Unchanged  Goal: Readiness for Transition of Care  Outcome: Ongoing - Unchanged  Goal: Rounds/Family Conference  Outcome: Ongoing - Unchanged     Problem: Fall Injury Risk  Goal: Absence of Fall and Fall-Related Injury  Outcome: Ongoing - Unchanged  Intervention: Promote Injury-Free Environment  Recent Flowsheet Documentation  Taken 10/04/2023 0400 by Kathyrn Sheriff, RN  Safety Interventions:   fall reduction program maintained   low bed  Taken 10/04/2023 0200 by Kathyrn Sheriff, RN  Safety Interventions:   fall reduction program maintained   low bed  Taken 10/04/2023 0000 by Kathyrn Sheriff, RN  Safety Interventions:   fall reduction program maintained   low bed  Taken 10/03/2023 2200 by Kathyrn Sheriff, RN  Safety Interventions:   fall reduction program maintained   low bed  Taken 10/03/2023 2000 by Kathyrn Sheriff, RN  Safety Interventions:   fall reduction program maintained   low bed     Problem: Wound  Goal: Optimal Coping  Outcome: Ongoing - Unchanged  Goal: Optimal Functional Ability  Outcome: Ongoing - Unchanged  Goal: Absence of Infection Signs and Symptoms  Outcome: Ongoing - Unchanged  Goal: Improved Oral Intake  Outcome: Ongoing - Unchanged  Goal: Optimal Pain Control and Function  Outcome: Ongoing - Unchanged  Goal: Skin Health and Integrity  Outcome: Ongoing - Unchanged  Goal: Optimal Wound Healing  Outcome: Ongoing - Unchanged

## 2023-10-05 LAB — CBC
HEMATOCRIT: 31.1 % — ABNORMAL LOW (ref 34.0–44.0)
HEMOGLOBIN: 9.9 g/dL — ABNORMAL LOW (ref 11.3–14.9)
MEAN CORPUSCULAR HEMOGLOBIN CONC: 31.6 g/dL — ABNORMAL LOW (ref 32.0–36.0)
MEAN CORPUSCULAR HEMOGLOBIN: 24.8 pg — ABNORMAL LOW (ref 25.9–32.4)
MEAN CORPUSCULAR VOLUME: 78.5 fL (ref 77.6–95.7)
MEAN PLATELET VOLUME: 7.3 fL (ref 6.8–10.7)
PLATELET COUNT: 204 10*9/L (ref 150–450)
RED BLOOD CELL COUNT: 3.97 10*12/L (ref 3.95–5.13)
RED CELL DISTRIBUTION WIDTH: 20.5 % — ABNORMAL HIGH (ref 12.2–15.2)
WBC ADJUSTED: 7.7 10*9/L (ref 3.6–11.2)

## 2023-10-05 LAB — BASIC METABOLIC PANEL
ANION GAP: 12 mmol/L (ref 5–14)
BLOOD UREA NITROGEN: 13 mg/dL (ref 9–23)
BUN / CREAT RATIO: 2
CALCIUM: 7.6 mg/dL — ABNORMAL LOW (ref 8.7–10.4)
CHLORIDE: 102 mmol/L (ref 98–107)
CO2: 26 mmol/L (ref 20.0–31.0)
CREATININE: 5.98 mg/dL — ABNORMAL HIGH (ref 0.55–1.02)
EGFR CKD-EPI (2021) FEMALE: 8 mL/min/{1.73_m2} — ABNORMAL LOW (ref >=60)
GLUCOSE RANDOM: 75 mg/dL (ref 70–179)
POTASSIUM: 3.8 mmol/L (ref 3.4–4.8)
SODIUM: 140 mmol/L (ref 135–145)

## 2023-10-05 LAB — PHOSPHORUS: PHOSPHORUS: 3.5 mg/dL (ref 2.4–5.1)

## 2023-10-05 LAB — MAGNESIUM: MAGNESIUM: 1.7 mg/dL (ref 1.6–2.6)

## 2023-10-05 MED ADMIN — sevelamer (RENVELA) tablet 2,400 mg: 2400 mg | ORAL | @ 14:00:00

## 2023-10-05 MED ADMIN — sevelamer (RENVELA) tablet 2,400 mg: 2400 mg | ORAL | @ 19:00:00

## 2023-10-05 MED ADMIN — mirtazapine (REMERON) tablet 15 mg: 15 mg | ORAL | @ 02:00:00

## 2023-10-05 MED ADMIN — acetaminophen (TYLENOL) tablet 650 mg: 650 mg | ORAL | @ 01:00:00

## 2023-10-05 MED ADMIN — senna (SENOKOT) tablet 2 tablet: 2 | ORAL | @ 14:00:00

## 2023-10-05 MED ADMIN — nystatin (MYCOSTATIN) powder: TOPICAL | @ 02:00:00

## 2023-10-05 MED ADMIN — fluticasone furoate (ARNUITY ELLIPTA) 100 mcg/actuation inhaler 1 puff: 1 | RESPIRATORY_TRACT | @ 13:00:00

## 2023-10-05 MED ADMIN — nystatin (MYCOSTATIN) powder: TOPICAL | @ 14:00:00

## 2023-10-05 MED ADMIN — sevelamer (RENVELA) tablet 2,400 mg: 2400 mg | ORAL

## 2023-10-05 MED ADMIN — apixaban (ELIQUIS) tablet 2.5 mg: 2.5 mg | ORAL | @ 02:00:00

## 2023-10-05 MED ADMIN — calcitriol (ROCALTROL) capsule 0.5 mcg: .5 ug | ORAL | @ 14:00:00

## 2023-10-05 MED ADMIN — cinacalcet (SENSIPAR) tablet 60 mg: 60 mg | ORAL | @ 14:00:00

## 2023-10-05 MED ADMIN — apixaban (ELIQUIS) tablet 2.5 mg: 2.5 mg | ORAL | @ 14:00:00

## 2023-10-05 MED ADMIN — aspirin chewable tablet 81 mg: 81 mg | ORAL | @ 14:00:00

## 2023-10-05 MED ADMIN — iohexol (OMNIPAQUE) 350 mg iodine/mL solution 100 mL: 100 mL | INTRAVENOUS | @ 18:00:00 | Stop: 2023-10-05

## 2023-10-05 NOTE — Unmapped (Signed)
PT A&O x4, pt report pain at the PD surgical site, prn Tylenol given for this pain.  Messaged Fam [place] no additional medication ordered. No acute events during this shift.  Call light within reach, plan of care continues.    Problem: Adult Inpatient Plan of Care  Goal: Plan of Care Review  Outcome: Ongoing - Unchanged  Flowsheets (Taken 10/05/2023 0210)  Progress: no change  Goal: Patient-Specific Goal (Individualized)  Outcome: Ongoing - Unchanged  Goal: Absence of Hospital-Acquired Illness or Injury  Outcome: Ongoing - Unchanged  Intervention: Identify and Manage Fall Risk  Recent Flowsheet Documentation  Taken 10/04/2023 2200 by Lance Muss, RN  Safety Interventions: fall reduction program maintained  Taken 10/04/2023 2000 by Lance Muss, RN  Safety Interventions: fall reduction program maintained  Intervention: Prevent Infection  Recent Flowsheet Documentation  Taken 10/04/2023 2200 by Lance Muss, RN  Infection Prevention: rest/sleep promoted  Taken 10/04/2023 2000 by Lance Muss, RN  Infection Prevention: rest/sleep promoted  Goal: Optimal Comfort and Wellbeing  Outcome: Ongoing - Unchanged  Goal: Readiness for Transition of Care  Outcome: Ongoing - Unchanged  Goal: Rounds/Family Conference  Outcome: Ongoing - Unchanged     Problem: Fall Injury Risk  Goal: Absence of Fall and Fall-Related Injury  Outcome: Ongoing - Unchanged  Intervention: Promote Injury-Free Environment  Recent Flowsheet Documentation  Taken 10/04/2023 2200 by Lance Muss, RN  Safety Interventions: fall reduction program maintained  Taken 10/04/2023 2000 by Lance Muss, RN  Safety Interventions: fall reduction program maintained     Problem: Wound  Goal: Optimal Coping  Outcome: Ongoing - Unchanged  Goal: Optimal Functional Ability  Outcome: Ongoing - Unchanged  Intervention: Optimize Functional Ability  Recent Flowsheet Documentation  Taken 10/04/2023 2000 by Lance Muss, RN  Activity Management: (pt falls risk) back to bed  Goal: Absence of Infection Signs and Symptoms  Outcome: Ongoing - Unchanged  Intervention: Prevent or Manage Infection  Recent Flowsheet Documentation  Taken 10/04/2023 2200 by Lance Muss, RN  Infection Management: aseptic technique maintained  Taken 10/04/2023 2000 by Lance Muss, RN  Infection Management: aseptic technique maintained  Goal: Improved Oral Intake  Outcome: Ongoing - Unchanged  Goal: Optimal Pain Control and Function  Outcome: Ongoing - Unchanged  Goal: Skin Health and Integrity  Outcome: Ongoing - Unchanged  Intervention: Optimize Skin Protection  Recent Flowsheet Documentation  Taken 10/04/2023 2000 by Lance Muss, RN  Activity Management: (pt falls risk) back to bed  Goal: Optimal Wound Healing  Outcome: Ongoing - Unchanged     Problem: Hemodialysis  Goal: Safe, Effective Therapy Delivery  Outcome: Ongoing - Unchanged  Goal: Effective Tissue Perfusion  Outcome: Ongoing - Unchanged  Goal: Absence of Infection Signs and Symptoms  Outcome: Ongoing - Unchanged  Intervention: Prevent or Manage Infection  Recent Flowsheet Documentation  Taken 10/04/2023 2200 by Lance Muss, RN  Infection Management: aseptic technique maintained  Infection Prevention: rest/sleep promoted  Taken 10/04/2023 2000 by Lance Muss, RN  Infection Management: aseptic technique maintained  Infection Prevention: rest/sleep promoted

## 2023-10-05 NOTE — Unmapped (Signed)
UF goal reduced to 0.5l over 4 hours d/t low BP during dialysis    Problem: Hemodialysis  Goal: Safe, Effective Therapy Delivery  Outcome: Ongoing - Unchanged     Problem: Hemodialysis  Goal: Effective Tissue Perfusion  Outcome: Ongoing - Unchanged     Problem: Hemodialysis  Goal: Absence of Infection Signs and Symptoms  Outcome: Ongoing - Unchanged

## 2023-10-05 NOTE — Unmapped (Signed)
Family Medicine Inpatient Service  Progress Note    Team: Family Medicine [place] [facility] (pgr [phone])    Hospital Day: 1    ASSESSMENT / PLAN:   Megan Robertson is a 52 y.o. female with a past medical history significant for ESRD, anxiety, and chronic pain, who presented to [facility] ED on 12/11 on the advice of her nephrologist with concern for cellulitis of right arm over her fistula site, also reports abdominal pain from recent peritoneal dialysis catheter removal site (removed 12/10). Continued LLQ and RUE pain on palpation.    # Cellulitis  # Abd Pain  Presented with erythema, edema, and pain over right AV fistula site and abdominal pain and warmth near PD removal site, Nephrologist concerned for AVG site cellulitis. Right AVG site placed by General Surgery 08/15/2023 due to poor response with peritoneal dialysis. PD catheter and right internal jugular catheter were both removed on 12/10 without complication. States pain at her right AVG site is at baseline. CTAP with soft tissue stranding with locules of gas and skin thickening, consistent with cellulitis over the left ventral region, also notes area of high attenuation along superficial margin of rectus muscle which could represent active extravasation. S/p CTX, vancomycin, and zosyn in ED for concern for peritoneal infection. Afebrile with intermittent tachycardia, but otherwise VSS. On exam, both prior sites are bandaged and c/d/I, some increased erythema to skin diffusely (arms, abdomen, chest), tenderness to palpation of abdomen LLQ>rest, no evidence of purulence or induration at right AVG site or prior PD site.  Per Nephrologist that examined her 12/11, right AVG site was much more erythematous and concerning for overlying cellulitis. Non-purulent and MRSA screen negative. S/p temporary LIJ non-tunneled trialysis with VIR on 12/12 PM.  - Nephrology consulted, recommendations appreciated  - General Surgery consulted, recommendations appreciated   - No surgical intervention, agree with resting AVG    - Repeat CT A/P   - CTA of RUE  - VIR consulted, recommendations appreciated  - Transition from IV ceftriaxone 1 g daily to Linezolid 600 mg q12h to complete 14 day course  - Recheck vancomycin level on 12/15 (has received total 3 g since prior dialysis)  - Start linezolid pending vancomycin level < 15; linezolid should be prescribed as part of 2-week antibiotic course (12/11-12/25)  - Blood culture (12/11) NGTD at 48h  - Daily CBC    # ESRD on hemodialysis   # Hyperphosphatemia - Secondary Hyperparathyroidism  HD on MWF, last dialyzed Monday (12/9). Did not receive dialysis 12/11 due to concern for cellulitis of right AVG as above. Cr 7.98 on admission, 8.82 today (12/12). No need for emergent HD at this time. Plan to place temporary line as above.  - Nephrology consulted, recommendations appreciated  - VIR consulted, recommendations appreciated   - Likely pending tunnel line placement on 12/16   - tPA if LIJ CVC sluggish  - Daily BMP, Phos, Mg  - Continue sevelamer 2400 mg PO TID with meals  - Home ferric citrate not on formulary, will work towards getting approval if staying beyond 12/16  - Continue calcitriol 0.5 mcg daily and cinacalcet 60 mg daily  - Strict I&Os    # Hx of Normocytic Anemia   Hgb 11.6 on admission; down to 9.9 today (12/14). Receives Epo with dialysis   - Daily CBC    # Itching   # Eczema  Patient reported body itching on night of admission, improved with hydroxyzine. Reports itching as a reaction to antibiotics in  the past. Has eczema on bilateral upper thighs at baseline.  - Hydroxyzine 25 mg PO q6h PRN  - Nystatin topical BID under breasts     # Hx of clotted AVF   06/2023 had an AVF of the left brachiocephalic vein. PVL 12/12 suggests the right brachioaxillary AVG is patent throughout, demonstrates a non-vascularized mixed echogenic area adjacent to the arterial anastomosis.   - Continue home Eliquis 2.5 mg   [ ]  OP consideration of whether still needs both ASA and eliquis      Chronic and Stable Conditions:  # Asthma: Continue home albuterol prn, Anuity Ellipta 100 mcg daily, CPAP nightly   # Anxiety: Continue home Mirtazapine 15 mg, PO, nightly      # Checklist  - IVF: None  - Daily labs needed: CBC, BMP, Magnesium, and Phosphorus  - Currently Renal diet  - Bowel Regimen: Senna 2 tabs qHS  - DVT: Continue home anticoagulation with Eliquis  - Code Status:   Orders Placed This Encounter   Procedures    Full Code     Standing Status:   Standing     Number of Occurrences:   1     - Dispo: Floor    [ ]  Anticipated Discharge Location: Home  [ ]  PT/OT/DME: No needs anticipated  [ ]  CM/SW needs:  Continued assistance for Medicaid/Medicare needs  [ ]  Follow up appt:  Needs PCP, has General Surgery 12/17    SUBJECTIVE:  Interval events: Reported 10/10 abdominal pain yesterday evening. Received Tylenol and improved. Feels generally well this morning. No new symptoms. Reports LLQ and RUE pain with palpation, and subsequent burning sensation in LLQ.    REVIEW OF SYSTEMS:  Pertinent positives and negatives per HPI. A complete review of systems otherwise negative.    PHYSICAL EXAM:      Intake/Output Summary (Last 24 hours) at 10/05/2023 0644  Last data filed at 10/04/2023 1718  Gross per 24 hour   Intake 100 ml   Output 100 ml   Net 0 ml         Recent Vitals:  Vitals:    10/04/23 1943   BP: 111/91   Pulse: 119   Resp: 16   Temp: 37.3 ??C (99.1 ??F)   SpO2: 100%       GEN: Well appearing, inclined in bed, NAD  HEENT: NCAT, No scleral icterus. Conjunctiva non-erythematous. MMM.  CV: Tachycardic with regular rhythm. No murmurs/rubs/gallops. Prior tunneled line bandage c/d/I, LIJ trialysis line c/d/I without erythema, edema, or purulence.   Pulm: Normal work of breathing on room air. CTAB. No wheezing, crackles, or rhonchi.  Abd: Flat. Tender to palpation in LLQ.  No rebound tenderness or peritoneal signs. Bandage c/d/I. Normoactive bowel sounds.  Neuro: A&O x 3. No focal deficits.  Ext: No peripheral edema. Palpable distal pulses. Tenderness to palpation of RUE near AVG site.  Skin: erythema to bilateral upper arms, chest, abdomen, thighs; worse on RUE       LABS/ STUDIES:    All imaging, laboratory studies, and other pertinent tests including electrocardiography within the last 24 hours were reviewed and are summarized within the assessment and plan.     NUTRITION:                       [place], MS3      I attest that I have reviewed the student note and that the components of the history of the present illness, the  physical exam, and the assessment and plan documented were performed by me or were performed in my presence by the student where I verified the documentation and performed (or re-performed) the exam and medical decision making.      Mattie Marlin, MD  [facility] PGY-2

## 2023-10-05 NOTE — Unmapped (Signed)
General Surgery - Daily Progress Note    Subjective:  No acute events overnight.   She reports some discomfort over the graft in her right arm. She was able to do dialysis yesterday with her tunneled catheter. Denies any worsening skin changes.   She reports nausea last night, no vomiting.   She is having worsening abdominal pain near her PD cath exit site. She describes it as a burning sensation.   Bowel movement last night, passing gas this morning.   Voiding without difficulty, denies dysuria.    She remains tachycardic. Temp of 100* yesterday evening.     Objective:    Vital signs in last 24 hours:  Temp:  [36.8 ??C (98.2 ??F)-37.9 ??C (100.2 ??F)] 37.3 ??C (99.1 ??F)  Heart Rate:  [97-119] 119  Resp:  [15-17] 16  BP: (91-136)/(26-91) 111/91  MAP (mmHg):  [83-101] 99  SpO2:  [98 %-100 %] 100 %    Intake/Output last 3 shifts:  I/O last 3 completed shifts:  In: 100 [IV Piggyback:100]  Out: 100 [Other:100]  Intake/Output this shift:  No intake/output data recorded.    Lab Results   Component Value Date    WBC 7.7 10/05/2023    HGB 9.9 (L) 10/05/2023    HCT 31.1 (L) 10/05/2023    PLT 204 10/05/2023     Lab Results   Component Value Date    NA 140 10/05/2023    K 3.8 10/05/2023    CL 102 10/05/2023    CO2 26.0 10/05/2023    BUN 13 10/05/2023    CREATININE 5.98 (H) 10/05/2023    GLU 75 10/05/2023    CALCIUM 7.6 (L) 10/05/2023    MG 1.7 10/05/2023    PHOS 3.5 10/05/2023     Lab Results   Component Value Date    BILITOT 0.2 (L) 10/02/2023    BILIDIR 0.30 08/11/2020    PROT 7.0 10/02/2023    ALBUMIN 3.2 (L) 10/02/2023    ALT 17 10/02/2023    AST 31 10/02/2023    ALKPHOS 95 10/02/2023     Lab Results   Component Value Date    PT 11.4 10/02/2023    INR 1.00 10/02/2023    APTT 30.8 03/23/2022     Physical Exam:  General Appearance:    Alert, cooperative, no distress, appears stated age, resting in bed   Head:    Normocephalic, without obvious abnormality, atraumatic   Eyes:    PERRL, conjunctiva/corneas clear, EOM's intact Ears:    Normal TM's and external ear canals, both ears   Neck:   Symmetrical, trachea midline   Lungs:     Respirations unlabored    Heart:    Regular rate and rhythm   Abdomen:     Soft, tender to palpation in left lower quadrant, focally at PD exit site, incisions clean without surrounding erythema, warmth, or drainage   Extremities/Skin:   RUE with erythema extending to upper mid chest, right AV graft with palpable thrill and bruit auscultated, palpable radial pulse    Neurologic:   CNII-XII grossly intact     Assessment/Plan:  52 year old female with past medical history of ESRD (status post right brachioaxillary arc arteriovenous graft on 08/15/2023 with Dr. Darlin Drop and status post PD catheter removal by IR on 10/01/2023), anxiety, and chronic pain.   She was admitted with concerns of cellulitis at AVG site in RUE and abdominal infection.   She has been able to undergo dialysis via left internal  jugular tunneled cathter - last session yesterday.     - Recommend CT of RUE for evaluation for possible AVG infection.   - Recommend CT Abdomen/Pelvis due to worsening abdominal pain.   - Continue IV antibiotics.   - Continue dialysis via tunneled catheter.  - Continue rest of AVG.   - Will continue to follow.     Marrian Salvage, PA-C  10/05/2023  7:47 AM

## 2023-10-05 NOTE — Unmapped (Signed)
[facility] Nephrology Hemodialysis Procedure Note     10/04/2023    Megan Robertson was seen and examined on hemodialysis    CHIEF COMPLAINT: End Stage Renal Disease    INTERVAL HISTORY: Unable to dialyze yesterday due to nonfunctional CVC. IR repositioned and still sluggish/having to reverse lines but able to dialyze.    DIALYSIS TREATMENT DATA:  Estimated Dry Weight (kg): 81 kg (178 lb 9.2 oz) Patient Goal Weight (kg): 2.7 kg (5 lb 15.2 oz)   Pre-Treatment Weight (kg): 83.7 kg (184 lb 8.4 oz)    Dialysis Bath  Bath: 3 K+ / 2.5 Ca+  Dialysate Na (mEq/L): 137 mEq/L  Sodium Modeling (mEq/L): 35 mEq/L Dialyzer: F-180 (98 mLs)   Blood Flow Rate (mL/min): 150 mL/min Dialysis Flow (mL/min): 800 mL/min   Machine Temperature (C): 36.5 ??C (97.7 ??F)      PHYSICAL EXAM:  Vitals:  Temp:  [36.8 ??C (98.2 ??F)-37.9 ??C (100.2 ??F)] 37.8 ??C (100 ??F)  Heart Rate:  [97-113] 113  BP: (91-136)/(26-84) 136/52  MAP (mmHg):  [66-101] 101    General: in no acute distress, currently dialyzing in a Hemodialysis Recliner  Pulmonary: normal respiratory effort  Cardiovascular: regular rate and rhythm  Extremities: trace  edema  Access: Left IJ non-tunneled catheter     LAB DATA:  Lab Results   Component Value Date    NA 141 10/04/2023    K 4.3 10/04/2023    CL 107 10/04/2023    CO2 16.9 (L) 10/04/2023    BUN 29 (H) 10/04/2023    CREATININE 10.31 (H) 10/04/2023    CALCIUM 7.7 (L) 10/04/2023    MG 1.8 10/04/2023    PHOS 5.4 (H) 10/04/2023    ALBUMIN 3.2 (L) 10/02/2023      Lab Results   Component Value Date    HCT 31.7 (L) 10/04/2023    WBC 11.0 10/04/2023        ASSESSMENT/PLAN:  End Stage Renal Disease on Intermittent Hemodialysis:  UF goal: 2.7L as tolerated  Adjust medications for a GFR <10  Avoid nephrotoxic agents  Last HD Treatment:Ended Early (10/04/23)     Bone Mineral Metabolism:  Lab Results   Component Value Date    CALCIUM 7.7 (L) 10/04/2023    CALCIUM 8.1 (L) 10/03/2023    Lab Results   Component Value Date    ALBUMIN 3.2 (L) 10/02/2023 ALBUMIN 2.3 (L) 06/28/2023      Lab Results   Component Value Date    PHOS 5.4 (H) 10/04/2023    PHOS 6.1 (H) 10/03/2023    Lab Results   Component Value Date    PTH 261.5 (H) 10/24/2020    PTH 173.3 (H) 08/01/2020      Labs appropriate, no changes.    Anemia:   Lab Results   Component Value Date    HGB 10.0 (L) 10/04/2023    HGB 10.5 (L) 10/03/2023    HGB 11.6 10/02/2023    Iron Saturation (%)   Date Value Ref Range Status   06/23/2023 40 20 - 55 % Final      Lab Results   Component Value Date    FERRITIN 425.7 (H) 06/22/2023       Anemia labs appropriate, no changes.    Vascular Access:  Will need to consult IR for placement of tunneled catheter prior to discharge  Blood Flow Rate (mL/min): 150 mL/min    IV Antibiotics to be administered at discharge:  TBD    This procedure  was fully reviewed with the patient and/or their decision-maker. The risks, benefits, and alternatives were discussed prior to the procedure. All questions were answered and written informed consent was obtained.    Nashayla Telleria Clydene Fake, MD  [facility]

## 2023-10-05 NOTE — Unmapped (Signed)
Problem: Adult Inpatient Plan of Care  Goal: Plan of Care Review  Outcome: Ongoing - Unchanged  Goal: Patient-Specific Goal (Individualized)  Outcome: Ongoing - Unchanged  Goal: Absence of Hospital-Acquired Illness or Injury  Outcome: Ongoing - Unchanged  Intervention: Identify and Manage Fall Risk  Recent Flowsheet Documentation  Taken 10/04/2023 1800 by Toribio Harbour, RN  Safety Interventions:   fall reduction program maintained   no IV/BP/blood draw right arm   nonskid shoes/slippers when out of bed   room near unit station   supervised activity   infection management   isolation precautions  Taken 10/04/2023 1200 by Toribio Harbour, RN  Safety Interventions:   fall reduction program maintained   no IV/BP/blood draw right arm   nonskid shoes/slippers when out of bed   room near unit station   supervised activity   infection management   isolation precautions  Taken 10/04/2023 1000 by Toribio Harbour, RN  Safety Interventions:   fall reduction program maintained   no IV/BP/blood draw right arm   nonskid shoes/slippers when out of bed   room near unit station   supervised activity   infection management   isolation precautions  Taken 10/04/2023 0800 by Toribio Harbour, RN  Safety Interventions:   fall reduction program maintained   no IV/BP/blood draw right arm   nonskid shoes/slippers when out of bed   room near unit station   supervised activity   infection management   isolation precautions  Intervention: Prevent Skin Injury  Recent Flowsheet Documentation  Taken 10/04/2023 1800 by Toribio Harbour, RN  Device Skin Pressure Protection: absorbent pad utilized/changed  Skin Protection: adhesive use limited  Taken 10/04/2023 1200 by Toribio Harbour, RN  Device Skin Pressure Protection: absorbent pad utilized/changed  Skin Protection: adhesive use limited  Taken 10/04/2023 1000 by Toribio Harbour, RN  Device Skin Pressure Protection: absorbent pad utilized/changed  Skin Protection: adhesive use limited  Taken 10/04/2023 0800 by Toribio Harbour, RN  Device Skin Pressure Protection: absorbent pad utilized/changed  Skin Protection: adhesive use limited  Intervention: Prevent Infection  Recent Flowsheet Documentation  Taken 10/04/2023 1800 by Toribio Harbour, RN  Infection Prevention:   single patient room provided   rest/sleep promoted   hand hygiene promoted  Taken 10/04/2023 1200 by Toribio Harbour, RN  Infection Prevention:   single patient room provided   rest/sleep promoted   hand hygiene promoted  Taken 10/04/2023 1000 by Toribio Harbour, RN  Infection Prevention:   single patient room provided   rest/sleep promoted   hand hygiene promoted  Taken 10/04/2023 0800 by Toribio Harbour, RN  Infection Prevention:   single patient room provided   rest/sleep promoted   hand hygiene promoted  Goal: Optimal Comfort and Wellbeing  Outcome: Ongoing - Unchanged  Goal: Readiness for Transition of Care  Outcome: Ongoing - Unchanged  Goal: Rounds/Family Conference  Outcome: Ongoing - Unchanged     Problem: Fall Injury Risk  Goal: Absence of Fall and Fall-Related Injury  Outcome: Ongoing - Unchanged  Intervention: Promote Injury-Free Environment  Recent Flowsheet Documentation  Taken 10/04/2023 1800 by Toribio Harbour, RN  Safety Interventions:   fall reduction program maintained   no IV/BP/blood draw right arm   nonskid shoes/slippers when out of bed   room near unit station   supervised activity   infection management   isolation precautions  Taken 10/04/2023 1200 by Toribio Harbour, RN  Safety Interventions:   fall reduction program maintained   no IV/BP/blood draw right arm   nonskid shoes/slippers  when out of bed   room near unit station   supervised activity   infection management   isolation precautions  Taken 10/04/2023 1000 by Toribio Harbour, RN  Safety Interventions:   fall reduction program maintained   no IV/BP/blood draw right arm   nonskid shoes/slippers when out of bed   room near unit station   supervised activity   infection management   isolation precautions  Taken 10/04/2023 0800 by Toribio Harbour, RN  Safety Interventions:   fall reduction program maintained   no IV/BP/blood draw right arm   nonskid shoes/slippers when out of bed   room near unit station   supervised activity   infection management   isolation precautions     Problem: Wound  Goal: Optimal Coping  Outcome: Ongoing - Unchanged  Goal: Optimal Functional Ability  Outcome: Ongoing - Unchanged  Intervention: Optimize Functional Ability  Recent Flowsheet Documentation  Taken 10/04/2023 0825 by Toribio Harbour, RN  Activity Management:   ambulated to bathroom   back to bed  Goal: Absence of Infection Signs and Symptoms  Outcome: Ongoing - Unchanged  Intervention: Prevent or Manage Infection  Recent Flowsheet Documentation  Taken 10/04/2023 1800 by Toribio Harbour, RN  Infection Management: aseptic technique maintained  Taken 10/04/2023 1200 by Toribio Harbour, RN  Infection Management: aseptic technique maintained  Taken 10/04/2023 1000 by Toribio Harbour, RN  Infection Management: aseptic technique maintained  Taken 10/04/2023 0800 by Toribio Harbour, RN  Infection Management: aseptic technique maintained  Goal: Improved Oral Intake  Outcome: Ongoing - Unchanged  Goal: Optimal Pain Control and Function  Outcome: Ongoing - Unchanged  Goal: Skin Health and Integrity  Outcome: Ongoing - Unchanged  Intervention: Optimize Skin Protection  Recent Flowsheet Documentation  Taken 10/04/2023 1800 by Toribio Harbour, RN  Pressure Reduction Techniques: frequent weight shift encouraged  Pressure Reduction Devices: pressure-redistributing mattress utilized  Skin Protection: adhesive use limited  Taken 10/04/2023 1200 by Toribio Harbour, RN  Pressure Reduction Techniques: frequent weight shift encouraged  Pressure Reduction Devices: pressure-redistributing mattress utilized  Skin Protection: adhesive use limited  Taken 10/04/2023 1000 by Toribio Harbour, RN  Pressure Reduction Techniques: frequent weight shift encouraged  Pressure Reduction Devices: pressure-redistributing mattress utilized  Skin Protection: adhesive use limited  Taken 10/04/2023 0825 by Toribio Harbour, RN  Activity Management:   ambulated to bathroom   back to bed  Taken 10/04/2023 0800 by Toribio Harbour, RN  Pressure Reduction Techniques: frequent weight shift encouraged  Pressure Reduction Devices: pressure-redistributing mattress utilized  Skin Protection: adhesive use limited  Goal: Optimal Wound Healing  Outcome: Ongoing - Unchanged     Problem: Hemodialysis  Goal: Absence of Infection Signs and Symptoms  Intervention: Prevent or Manage Infection  Recent Flowsheet Documentation  Taken 10/04/2023 1800 by Toribio Harbour, RN  Infection Management: aseptic technique maintained  Infection Prevention:   single patient room provided   rest/sleep promoted   hand hygiene promoted  Taken 10/04/2023 1200 by Toribio Harbour, RN  Infection Management: aseptic technique maintained  Infection Prevention:   single patient room provided   rest/sleep promoted   hand hygiene promoted  Taken 10/04/2023 1000 by Toribio Harbour, RN  Infection Management: aseptic technique maintained  Infection Prevention:   single patient room provided   rest/sleep promoted   hand hygiene promoted  Taken 10/04/2023 0800 by Toribio Harbour, RN  Infection Management: aseptic technique maintained  Infection Prevention:   single patient room provided   rest/sleep promoted   hand hygiene promoted

## 2023-10-06 LAB — BASIC METABOLIC PANEL
ANION GAP: 15 mmol/L — ABNORMAL HIGH (ref 5–14)
BLOOD UREA NITROGEN: 21 mg/dL (ref 9–23)
BUN / CREAT RATIO: 3
CALCIUM: 7.7 mg/dL — ABNORMAL LOW (ref 8.7–10.4)
CHLORIDE: 102 mmol/L (ref 98–107)
CO2: 26.6 mmol/L (ref 20.0–31.0)
CREATININE: 7.96 mg/dL — ABNORMAL HIGH (ref 0.55–1.02)
EGFR CKD-EPI (2021) FEMALE: 6 mL/min/{1.73_m2} — ABNORMAL LOW (ref >=60)
GLUCOSE RANDOM: 77 mg/dL (ref 70–179)
POTASSIUM: 3.9 mmol/L (ref 3.5–5.1)
SODIUM: 144 mmol/L (ref 135–145)

## 2023-10-06 LAB — CBC
HEMATOCRIT: 30.5 % — ABNORMAL LOW (ref 34.0–44.0)
HEMOGLOBIN: 9.6 g/dL — ABNORMAL LOW (ref 11.3–14.9)
MEAN CORPUSCULAR HEMOGLOBIN CONC: 31.5 g/dL — ABNORMAL LOW (ref 32.0–36.0)
MEAN CORPUSCULAR HEMOGLOBIN: 24.7 pg — ABNORMAL LOW (ref 25.9–32.4)
MEAN CORPUSCULAR VOLUME: 78.6 fL (ref 77.6–95.7)
MEAN PLATELET VOLUME: 6.8 fL (ref 6.8–10.7)
PLATELET COUNT: 250 10*9/L (ref 150–450)
RED BLOOD CELL COUNT: 3.88 10*12/L — ABNORMAL LOW (ref 3.95–5.13)
RED CELL DISTRIBUTION WIDTH: 20 % — ABNORMAL HIGH (ref 12.2–15.2)
WBC ADJUSTED: 7.5 10*9/L (ref 3.6–11.2)

## 2023-10-06 LAB — MAGNESIUM: MAGNESIUM: 1.9 mg/dL (ref 1.6–2.6)

## 2023-10-06 LAB — VANCOMYCIN, RANDOM: VANCOMYCIN RANDOM: 26.4 ug/mL

## 2023-10-06 LAB — PHOSPHORUS: PHOSPHORUS: 3.5 mg/dL (ref 2.4–5.1)

## 2023-10-06 MED ADMIN — nystatin (MYCOSTATIN) powder: TOPICAL | @ 01:00:00

## 2023-10-06 MED ADMIN — sevelamer (RENVELA) tablet 2,400 mg: 2400 mg | ORAL | @ 14:00:00

## 2023-10-06 MED ADMIN — fluticasone furoate (ARNUITY ELLIPTA) 100 mcg/actuation inhaler 1 puff: 1 | RESPIRATORY_TRACT | @ 14:00:00

## 2023-10-06 MED ADMIN — aspirin chewable tablet 81 mg: 81 mg | ORAL | @ 14:00:00

## 2023-10-06 MED ADMIN — calcitriol (ROCALTROL) capsule 0.5 mcg: .5 ug | ORAL | @ 14:00:00

## 2023-10-06 MED ADMIN — apixaban (ELIQUIS) tablet 2.5 mg: 2.5 mg | ORAL | @ 14:00:00

## 2023-10-06 MED ADMIN — cinacalcet (SENSIPAR) tablet 60 mg: 60 mg | ORAL | @ 14:00:00

## 2023-10-06 MED ADMIN — apixaban (ELIQUIS) tablet 2.5 mg: 2.5 mg | ORAL | @ 01:00:00

## 2023-10-06 MED ADMIN — sevelamer (RENVELA) tablet 2,400 mg: 2400 mg | ORAL | @ 18:00:00

## 2023-10-06 MED ADMIN — mirtazapine (REMERON) tablet 15 mg: 15 mg | ORAL | @ 01:00:00

## 2023-10-06 MED ADMIN — nystatin (MYCOSTATIN) powder: TOPICAL | @ 14:00:00

## 2023-10-06 NOTE — Unmapped (Signed)
General Surgery - Daily Progress Note    Subjective:  No acute events overnight.   She states she feels the same.   Her abdominal pain has not worsened, mostly discomfort in LLQ near her incisions. She has had a bowel movement.   Reports still having discomfort over the AV graft site in her right arm. Erythema hasn't worsened.   She is requesting to not have as many restrictions for her diet.   Afebrile overnight.   She had CT A/P and CT RUE done yesterday.     Objective:    Vital signs in last 24 hours:  Temp:  [36.9 ??C (98.4 ??F)-37.1 ??C (98.8 ??F)] 36.9 ??C (98.4 ??F)  Heart Rate:  [98-107] 107  Resp:  [18] 18  BP: (120-149)/(65-75) 149/75  MAP (mmHg):  [83-96] 96  SpO2:  [96 %-99 %] 98 %    Intake/Output last 3 shifts:  No intake/output data recorded.  Intake/Output this shift:  No intake/output data recorded.    Lab Results   Component Value Date    WBC 7.5 10/06/2023    HGB 9.6 (L) 10/06/2023    HCT 30.5 (L) 10/06/2023    PLT 250 10/06/2023     Lab Results   Component Value Date    NA 144 10/06/2023    K 3.9 10/06/2023    CL 102 10/06/2023    CO2 26.6 10/06/2023    BUN 21 10/06/2023    CREATININE 7.96 (H) 10/06/2023    GLU 77 10/06/2023    CALCIUM 7.7 (L) 10/06/2023    MG 1.9 10/06/2023    PHOS 3.5 10/06/2023     Lab Results   Component Value Date    BILITOT 0.2 (L) 10/02/2023    BILIDIR 0.30 08/11/2020    PROT 7.0 10/02/2023    ALBUMIN 3.2 (L) 10/02/2023    ALT 17 10/02/2023    AST 31 10/02/2023    ALKPHOS 95 10/02/2023     Lab Results   Component Value Date    PT 11.4 10/02/2023    INR 1.00 10/02/2023    APTT 30.8 03/23/2022      Imaging:  CTA Upper Extremity 10/05/2023: report pending, images reviewed with Dr. Carlynn Purl, no air visualized concerning for infection  CT Abdomen/Pelvis 10/05/2023:   Report notes -    Impression:  - Evolving postsurgical changes of peritoneal dialysis catheter removal.  - Enlarging intra-abdominal lymph nodes, likely reactive in the setting of recent surgical intervention.  - Moderate rectal stool ball.  - Small bilateral pleural effusions.    Physical Exam:  General Appearance:    Alert, cooperative, no distress, appears stated age   Head:    Normocephalic, without obvious abnormality, atraumatic   Eyes:    Sclera non-icteric, EOM's intact   Ears:    Normal external ear canals, both ears   Nose:   Nares normal   Throat:   Lips normal   Lungs:     Respirations unlabored, on room air    Heart:    Regular rate and rhythm    Abdomen:     Soft, tender in left lower quadrant, focally at incisions (improved)   Extremities/Skin:   RUE erythema improved, right AV graft with palpable thrill and bruit auscultated, palpable radial pulse   Neurologic:   CNII-XII grossly intact     Assessment/Plan:  52 year old female with past medical history of ESRD (status post right brachioaxillary arc arteriovenous graft on 08/15/2023 with Dr. Darlin Drop and status  post PD catheter removal by IR on 10/01/2023), anxiety, and chronic pain.   She was admitted with concerns of cellulitis at AVG site in RUE and abdominal infection.   She has been undergoing dialysis via left internal jugular tunneled catheter.    - CT RUE reviewed with Dr. Carlynn Purl, radiology report pending, no concerning findings for infection. Recommend rest of AVG. Erythema stable. Dr. Darlin Drop will see patient tomorrow to make recommendations of when cleared to use.   - CT A/P shows post-surgical changes. Abdominal pain stable, post-surgical, reviewed will continue to improve over time. Dr. Carlynn Purl informed her to massage her incisions to help break down scar tissue and nerve de-sensitization.   - Continue antibiotics.   - Continue dialysis via tunneled catheter.   - Will continue to follow.     Marrian Salvage, PA-C  10/06/2023  11:12 AM

## 2023-10-06 NOTE — Unmapped (Addendum)
Pt is alert and oriented, VSS. Call light and bedside table within reach, no acute event this shift.  Problem: Adult Inpatient Plan of Care  Goal: Plan of Care Review  Outcome: Ongoing - Unchanged  Flowsheets (Taken 10/06/2023 0405)  Progress: no change  Plan of Care Reviewed With: patient  Goal: Patient-Specific Goal (Individualized)  Outcome: Ongoing - Unchanged  Goal: Absence of Hospital-Acquired Illness or Injury  Outcome: Ongoing - Unchanged  Intervention: Identify and Manage Fall Risk  Recent Flowsheet Documentation  Taken 10/05/2023 2252 by Crista Elliot, RN  Safety Interventions: fall reduction program maintained  Taken 10/05/2023 2015 by Crista Elliot, RN  Safety Interventions: fall reduction program maintained  Intervention: Prevent Skin Injury  Recent Flowsheet Documentation  Taken 10/05/2023 2252 by Crista Elliot, RN  Positioning for Skin: (pt position self) Other (Comment)  Device Skin Pressure Protection: absorbent pad utilized/changed  Skin Protection: adhesive use limited  Taken 10/05/2023 2015 by Crista Elliot, RN  Positioning for Skin: Supine/Back  Device Skin Pressure Protection: absorbent pad utilized/changed  Skin Protection: adhesive use limited  Goal: Optimal Comfort and Wellbeing  Outcome: Ongoing - Unchanged  Goal: Readiness for Transition of Care  Outcome: Ongoing - Unchanged  Goal: Rounds/Family Conference  Outcome: Ongoing - Unchanged     Problem: Fall Injury Risk  Goal: Absence of Fall and Fall-Related Injury  Outcome: Ongoing - Unchanged  Intervention: Promote Injury-Free Environment  Recent Flowsheet Documentation  Taken 10/05/2023 2252 by Crista Elliot, RN  Safety Interventions: fall reduction program maintained  Taken 10/05/2023 2015 by Crista Elliot, RN  Safety Interventions: fall reduction program maintained     Problem: Wound  Goal: Optimal Coping  Outcome: Ongoing - Unchanged  Goal: Optimal Functional Ability  Outcome: Ongoing - Unchanged  Goal: Absence of Infection Signs and Symptoms  Outcome: Ongoing - Unchanged  Goal: Improved Oral Intake  Outcome: Ongoing - Unchanged  Goal: Optimal Pain Control and Function  Outcome: Ongoing - Unchanged  Goal: Skin Health and Integrity  Outcome: Ongoing - Unchanged  Intervention: Optimize Skin Protection  Recent Flowsheet Documentation  Taken 10/05/2023 2252 by Crista Elliot, RN  Pressure Reduction Techniques: frequent weight shift encouraged  Pressure Reduction Devices: pressure-redistributing mattress utilized  Skin Protection: adhesive use limited  Taken 10/05/2023 2015 by Crista Elliot, RN  Pressure Reduction Techniques: frequent weight shift encouraged  Pressure Reduction Devices: pressure-redistributing mattress utilized  Skin Protection: adhesive use limited  Goal: Optimal Wound Healing  Outcome: Ongoing - Unchanged     Problem: Hemodialysis  Goal: Safe, Effective Therapy Delivery  Outcome: Ongoing - Unchanged  Goal: Effective Tissue Perfusion  Outcome: Ongoing - Unchanged  Goal: Absence of Infection Signs and Symptoms  Outcome: Ongoing - Unchanged

## 2023-10-06 NOTE — Unmapped (Signed)
Family Medicine Inpatient Service  Progress Note    Team: Family Medicine [facility] (pgr [phone])    Hospital Day: 2    ASSESSMENT / PLAN:   Megan Robertson is a 52 y.o. female with a past medical history significant for ESRD, anxiety, and chronic pain, who presented to [facility] ED on 12/11 on the advice of her nephrologist with concern for cellulitis of right arm over her fistula site, also reports abdominal pain from recent peritoneal dialysis catheter removal site (removed 12/10). Continued LLQ and RUE pain on palpation.    # Cellulitis  # Abdominal Pain, improving  Presented with erythema, edema, and pain over right AV fistula site and abdominal pain and warmth near PD removal site, Nephrologist concerned for AVG site cellulitis. Right AVG site placed by General Surgery 08/15/2023 due to poor response with peritoneal dialysis. PD catheter and right internal jugular catheter were both removed on 12/10 without complication, given injection of vancomycin that day. States pain at her right AVG site is at baseline. S/p CTX, vancomycin, and zosyn in ED for concern for peritoneal infection. S/p temporary LIJ non-tunneled trialysis with VIR on 12/12 PM. VS have remained stable, afebrile, intermittently tachycardic. Initial exam notable for erythema to skin diffusely (arms, abdomen, chest) and itching, which has improved on exam 12/15 now with mild skin peeling. Continues to have abdominal tenderness in LLQ, but slowly improving. Prior PD site and RIJ catheter sites well healing, and new LIJ site c/d/I. Repeat CTAP 12/14 with evolving post-surgical changes, enlarging abdominal lymph nodes likely reactive, and moderate rectal stool ball.   - Nephrology consulted, recommendations appreciated  - General Surgery consulted, recommendations appreciated   - No surgical intervention, agree with resting AVG    - CTA of RUE: pending read  - VIR consulted, recommendations appreciated   - Plan for tunneled line on 12/16  - Transition from IV ceftriaxone 1 g daily to Linezolid 600 mg q12h to complete 14 day course   - Vancomycin level 26.4 12/15 iso received total of 3g 12/10-11  - Start linezolid pending vancomycin level < 15; linezolid should be prescribed as part of 2-week antibiotic course (12/11-12/25)  - Blood culture (12/11) NGTD at 72h  - Daily CBC    # ESRD on hemodialysis   # Hyperphosphatemia - Secondary Hyperparathyroidism  HD on MWF, last dialyzed Monday (12/9). Did not receive dialysis 12/11 due to concern for cellulitis of right AVG as above. Cr 7.98 on admission, 8.82 today (12/12). No need for emergent HD at this time. Has temporary line as above.  - Nephrology consulted, recommendations appreciated  - VIR consulted, recommendations appreciated   - Likely pending tunnel line placement on 12/16   - NPO at midnight for placement   - tPA if LIJ CVC sluggish  - Daily BMP, Phos, Mg  - Continue sevelamer 2400 mg PO TID with meals  - Home ferric citrate not on formulary, will work towards getting approval if staying beyond 12/16  - Continue calcitriol 0.5 mcg daily and cinacalcet 60 mg daily  - Strict I&Os    # Hx of Normocytic Anemia   Hgb 11.6 on admission; down to 9.9 today (12/14). Receives Epo with dialysis   - Daily CBC    # Itching, improved  # Eczema  Patient reported body itching on night of admission, improved with hydroxyzine. Reports itching as a reaction to antibiotics in the past. Do wonder if erythema and itching could have partly been a reaction to vancomycin  as improving and vancomycin level is decreasing.   - Hydroxyzine 25 mg PO q6h PRN  - Continue lotion and petroleum jelly for dry, peeling skin  - Nystatin topical BID under breasts     # Hx of clotted AVF   06/2023 had an AVF of the left brachiocephalic vein. PVL 12/12 suggests the right brachioaxillary AVG is patent throughout, demonstrates a non-vascularized mixed echogenic area adjacent to the arterial anastomosis.   - Continue home Eliquis 2.5 mg   [ ]  OP consideration of whether still needs both ASA and eliquis      Chronic and Stable Conditions:  # Asthma: Continue home albuterol prn, Anuity Ellipta 100 mcg daily, CPAP nightly   # Anxiety: Continue home Mirtazapine 15 mg, PO, nightly      # Checklist  - IVF: None  - Daily labs needed: CBC, BMP, Magnesium, and Phosphorus  - Currently Regular diet  - Bowel Regimen: Senna 2 tabs qHS, miralax BID  - DVT: Continue home anticoagulation with Eliquis  - Code Status:   Orders Placed This Encounter   Procedures    Full Code     Standing Status:   Standing     Number of Occurrences:   1     - Dispo: Floor    [ ]  Anticipated Discharge Location: Home  [ ]  PT/OT/DME: No needs anticipated  [ ]  CM/SW needs:  Continued assistance for Medicaid/Medicare needs  [ ]  Follow up appt:  Needs PCP, has General Surgery 12/17    SUBJECTIVE:  Interval events: NAEON. Doing well this morning, but would like to have a regular diet. Abdominal pain is still present, but somewhat improved. No complaints.     REVIEW OF SYSTEMS:  Pertinent positives and negatives per HPI. A complete review of systems otherwise negative.    PHYSICAL EXAM:    No intake or output data in the 24 hours ending 10/06/23 0701        Recent Vitals:  Vitals:    10/05/23 1945   BP: 120/67   Pulse: 98   Resp: 18   Temp: 37.1 ??C (98.8 ??F)   SpO2: 99%       GEN: Well appearing, inclined in bed, NAD  HEENT: NCAT, No scleral icterus. Conjunctiva non-erythematous. MMM.  CV: Tachycardic with regular rhythm. No murmurs/rubs/gallops. Prior tunneled line bandage c/d/I, LIJ trialysis line c/d/I without erythema, edema, or purulence.   Pulm: Normal work of breathing on room air. CTAB. No wheezing, crackles, or rhonchi.  Abd: Flat. Tender to palpation in LLQ.  No rebound tenderness or peritoneal signs. Normoactive bowel sounds.  Neuro: A&O x 3. No focal deficits.  Ext: No peripheral edema. Palpable distal pulses. Tenderness to palpation of RUE near AVG site.  Skin: improved erythema to bilateral upper arms, chest, abdomen, thighs; now with skin peeling      LABS/ STUDIES:    All imaging, laboratory studies, and other pertinent tests including electrocardiography within the last 24 hours were reviewed and are summarized within the assessment and plan.     NUTRITION:                       Cherie Dark, MD  Resident Physician Family Medicine, PGY-2

## 2023-10-07 LAB — MAGNESIUM: MAGNESIUM: 1.8 mg/dL (ref 1.6–2.6)

## 2023-10-07 LAB — CBC
HEMATOCRIT: 32.1 % — ABNORMAL LOW (ref 34.0–44.0)
HEMOGLOBIN: 9.9 g/dL — ABNORMAL LOW (ref 11.3–14.9)
MEAN CORPUSCULAR HEMOGLOBIN CONC: 30.9 g/dL — ABNORMAL LOW (ref 32.0–36.0)
MEAN CORPUSCULAR HEMOGLOBIN: 24.3 pg — ABNORMAL LOW (ref 25.9–32.4)
MEAN CORPUSCULAR VOLUME: 78.6 fL (ref 77.6–95.7)
MEAN PLATELET VOLUME: 6.6 fL — ABNORMAL LOW (ref 6.8–10.7)
PLATELET COUNT: 268 10*9/L (ref 150–450)
RED BLOOD CELL COUNT: 4.09 10*12/L (ref 3.95–5.13)
RED CELL DISTRIBUTION WIDTH: 19.7 % — ABNORMAL HIGH (ref 12.2–15.2)
WBC ADJUSTED: 8.4 10*9/L (ref 3.6–11.2)

## 2023-10-07 LAB — BASIC METABOLIC PANEL
ANION GAP: 15 mmol/L — ABNORMAL HIGH (ref 5–14)
BLOOD UREA NITROGEN: 24 mg/dL — ABNORMAL HIGH (ref 9–23)
BUN / CREAT RATIO: 3
CALCIUM: 7.8 mg/dL — ABNORMAL LOW (ref 8.7–10.4)
CHLORIDE: 104 mmol/L (ref 98–107)
CO2: 23.6 mmol/L (ref 20.0–31.0)
CREATININE: 9.32 mg/dL — ABNORMAL HIGH (ref 0.55–1.02)
EGFR CKD-EPI (2021) FEMALE: 5 mL/min/{1.73_m2} — ABNORMAL LOW (ref >=60)
GLUCOSE RANDOM: 83 mg/dL (ref 70–179)
POTASSIUM: 3.8 mmol/L (ref 3.4–4.8)
SODIUM: 143 mmol/L (ref 135–145)

## 2023-10-07 LAB — PHOSPHORUS: PHOSPHORUS: 4.1 mg/dL (ref 2.4–5.1)

## 2023-10-07 LAB — VANCOMYCIN, RANDOM: VANCOMYCIN RANDOM: 23.5 ug/mL

## 2023-10-07 MED ORDER — MIRTAZAPINE 15 MG TABLET
ORAL_TABLET | Freq: Every evening | ORAL | 0 refills | 30.00 days
Start: 2023-10-07 — End: 2023-11-06

## 2023-10-07 MED ADMIN — apixaban (ELIQUIS) tablet 2.5 mg: 2.5 mg | ORAL | @ 02:00:00

## 2023-10-07 MED ADMIN — mirtazapine (REMERON) tablet 15 mg: 15 mg | ORAL | @ 02:00:00

## 2023-10-07 MED ADMIN — fluticasone furoate (ARNUITY ELLIPTA) 100 mcg/actuation inhaler 1 puff: 1 | RESPIRATORY_TRACT | @ 13:00:00

## 2023-10-07 MED ADMIN — sevelamer (RENVELA) tablet 2,400 mg: 2400 mg | ORAL

## 2023-10-07 NOTE — Unmapped (Signed)
Patient is alert and oriented x 4. VSS on room air. Meds given see MAR. No complaints of pain this shift. She is NPO for her procedure that is planned for later today. Fall and safety precautions maintained. Call bell is within reach. Plan of care is on going.   Problem: Adult Inpatient Plan of Care  Goal: Plan of Care Review  Outcome: Ongoing - Unchanged  Goal: Patient-Specific Goal (Individualized)  Outcome: Ongoing - Unchanged  Goal: Absence of Hospital-Acquired Illness or Injury  Outcome: Ongoing - Unchanged  Intervention: Identify and Manage Fall Risk  Recent Flowsheet Documentation  Taken 10/07/2023 0000 by Havery Moros, RN  Safety Interventions: fall reduction program maintained  Taken 10/06/2023 2200 by Havery Moros, RN  Safety Interventions: fall reduction program maintained  Taken 10/06/2023 2000 by Havery Moros, RN  Safety Interventions:   fall reduction program maintained   nonskid shoes/slippers when out of bed  Goal: Optimal Comfort and Wellbeing  Outcome: Ongoing - Unchanged  Goal: Readiness for Transition of Care  Outcome: Ongoing - Unchanged  Goal: Rounds/Family Conference  Outcome: Ongoing - Unchanged     Problem: Fall Injury Risk  Goal: Absence of Fall and Fall-Related Injury  Outcome: Ongoing - Unchanged  Intervention: Promote Injury-Free Environment  Recent Flowsheet Documentation  Taken 10/07/2023 0000 by Havery Moros, RN  Safety Interventions: fall reduction program maintained  Taken 10/06/2023 2200 by Havery Moros, RN  Safety Interventions: fall reduction program maintained  Taken 10/06/2023 2000 by Havery Moros, RN  Safety Interventions:   fall reduction program maintained   nonskid shoes/slippers when out of bed     Problem: Wound  Goal: Optimal Coping  Outcome: Ongoing - Unchanged  Goal: Optimal Functional Ability  Outcome: Ongoing - Unchanged  Goal: Absence of Infection Signs and Symptoms  Outcome: Ongoing - Unchanged  Goal: Improved Oral Intake  Outcome: Ongoing - Unchanged  Goal: Optimal Pain Control and Function  Outcome: Ongoing - Unchanged  Goal: Skin Health and Integrity  Outcome: Ongoing - Unchanged  Goal: Optimal Wound Healing  Outcome: Ongoing - Unchanged     Problem: Hemodialysis  Goal: Safe, Effective Therapy Delivery  Outcome: Ongoing - Unchanged  Goal: Effective Tissue Perfusion  Outcome: Ongoing - Unchanged  Goal: Absence of Infection Signs and Symptoms  Outcome: Ongoing - Unchanged

## 2023-10-07 NOTE — Unmapped (Signed)
Patient switched to regular diet this shift, no other scute changes.     Problem: Adult Inpatient Plan of Care  Goal: Plan of Care Review  Outcome: Ongoing - Unchanged  Goal: Patient-Specific Goal (Individualized)  Outcome: Ongoing - Unchanged  Goal: Absence of Hospital-Acquired Illness or Injury  Outcome: Ongoing - Unchanged  Intervention: Identify and Manage Fall Risk  Recent Flowsheet Documentation  Taken 10/06/2023 1800 by Gaye Pollack, RN  Safety Interventions:   fall reduction program maintained   lighting adjusted for tasks/safety   low bed   nonskid shoes/slippers when out of bed  Taken 10/06/2023 1600 by Gaye Pollack, RN  Safety Interventions:   fall reduction program maintained   lighting adjusted for tasks/safety   low bed   nonskid shoes/slippers when out of bed  Taken 10/06/2023 1400 by Gaye Pollack, RN  Safety Interventions:   fall reduction program maintained   lighting adjusted for tasks/safety   low bed   nonskid shoes/slippers when out of bed  Taken 10/06/2023 1200 by Gaye Pollack, RN  Safety Interventions:   fall reduction program maintained   lighting adjusted for tasks/safety   low bed   nonskid shoes/slippers when out of bed  Taken 10/06/2023 1000 by Gaye Pollack, RN  Safety Interventions:   fall reduction program maintained   lighting adjusted for tasks/safety   low bed   nonskid shoes/slippers when out of bed  Taken 10/06/2023 0800 by Gaye Pollack, RN  Safety Interventions:   fall reduction program maintained   lighting adjusted for tasks/safety   low bed   nonskid shoes/slippers when out of bed  Intervention: Prevent Infection  Recent Flowsheet Documentation  Taken 10/06/2023 1800 by Gaye Pollack, RN  Infection Prevention:   hand hygiene promoted   rest/sleep promoted   single patient room provided  Taken 10/06/2023 1600 by Gaye Pollack, RN  Infection Prevention:   hand hygiene promoted   rest/sleep promoted   single patient room provided  Taken 10/06/2023 1400 by Gaye Pollack, RN  Infection Prevention:   hand hygiene promoted   rest/sleep promoted   single patient room provided  Taken 10/06/2023 1200 by Gaye Pollack, RN  Infection Prevention:   hand hygiene promoted   rest/sleep promoted   single patient room provided  Taken 10/06/2023 1000 by Gaye Pollack, RN  Infection Prevention:   hand hygiene promoted   rest/sleep promoted   single patient room provided  Taken 10/06/2023 0800 by Gaye Pollack, RN  Infection Prevention:   hand hygiene promoted   single patient room provided  Goal: Optimal Comfort and Wellbeing  Outcome: Ongoing - Unchanged  Goal: Readiness for Transition of Care  Outcome: Ongoing - Unchanged  Goal: Rounds/Family Conference  Outcome: Ongoing - Unchanged     Problem: Fall Injury Risk  Goal: Absence of Fall and Fall-Related Injury  Outcome: Ongoing - Unchanged  Intervention: Promote Injury-Free Environment  Recent Flowsheet Documentation  Taken 10/06/2023 1800 by Gaye Pollack, RN  Safety Interventions:   fall reduction program maintained   lighting adjusted for tasks/safety   low bed   nonskid shoes/slippers when out of bed  Taken 10/06/2023 1600 by Gaye Pollack, RN  Safety Interventions:   fall reduction program maintained   lighting adjusted for tasks/safety   low bed   nonskid shoes/slippers when out of bed  Taken 10/06/2023 1400 by Gaye Pollack, RN  Safety Interventions:   fall reduction program maintained  lighting adjusted for tasks/safety   low bed   nonskid shoes/slippers when out of bed  Taken 10/06/2023 1200 by Gaye Pollack, RN  Safety Interventions:   fall reduction program maintained   lighting adjusted for tasks/safety   low bed   nonskid shoes/slippers when out of bed  Taken 10/06/2023 1000 by Gaye Pollack, RN  Safety Interventions:   fall reduction program maintained   lighting adjusted for tasks/safety   low bed   nonskid shoes/slippers when out of bed  Taken 10/06/2023 0800 by Gaye Pollack, RN  Safety Interventions:   fall reduction program maintained   lighting adjusted for tasks/safety   low bed   nonskid shoes/slippers when out of bed     Problem: Wound  Goal: Optimal Coping  Outcome: Ongoing - Unchanged  Goal: Optimal Functional Ability  Outcome: Ongoing - Unchanged  Goal: Absence of Infection Signs and Symptoms  Outcome: Ongoing - Unchanged  Intervention: Prevent or Manage Infection  Recent Flowsheet Documentation  Taken 10/06/2023 1800 by Gaye Pollack, RN  Infection Management: aseptic technique maintained  Taken 10/06/2023 1600 by Gaye Pollack, RN  Infection Management: aseptic technique maintained  Taken 10/06/2023 1400 by Gaye Pollack, RN  Infection Management: aseptic technique maintained  Taken 10/06/2023 1200 by Gaye Pollack, RN  Infection Management: aseptic technique maintained  Taken 10/06/2023 1000 by Gaye Pollack, RN  Infection Management: aseptic technique maintained  Taken 10/06/2023 0800 by Gaye Pollack, RN  Infection Management: aseptic technique maintained  Goal: Improved Oral Intake  Outcome: Ongoing - Unchanged  Goal: Optimal Pain Control and Function  Outcome: Ongoing - Unchanged  Goal: Skin Health and Integrity  Outcome: Ongoing - Unchanged  Goal: Optimal Wound Healing  Outcome: Ongoing - Unchanged     Problem: Hemodialysis  Goal: Safe, Effective Therapy Delivery  Outcome: Ongoing - Unchanged  Goal: Effective Tissue Perfusion  Outcome: Ongoing - Unchanged  Goal: Absence of Infection Signs and Symptoms  Outcome: Ongoing - Unchanged  Intervention: Prevent or Manage Infection  Recent Flowsheet Documentation  Taken 10/06/2023 1800 by Gaye Pollack, RN  Infection Management: aseptic technique maintained  Infection Prevention:   hand hygiene promoted   rest/sleep promoted   single patient room provided  Taken 10/06/2023 1600 by Gaye Pollack, RN  Infection Management: aseptic technique maintained  Infection Prevention:   hand hygiene promoted rest/sleep promoted   single patient room provided  Taken 10/06/2023 1400 by Gaye Pollack, RN  Infection Management: aseptic technique maintained  Infection Prevention:   hand hygiene promoted   rest/sleep promoted   single patient room provided  Taken 10/06/2023 1200 by Gaye Pollack, RN  Infection Management: aseptic technique maintained  Infection Prevention:   hand hygiene promoted   rest/sleep promoted   single patient room provided  Taken 10/06/2023 1000 by Gaye Pollack, RN  Infection Management: aseptic technique maintained  Infection Prevention:   hand hygiene promoted   rest/sleep promoted   single patient room provided  Taken 10/06/2023 0800 by Gaye Pollack, RN  Infection Management: aseptic technique maintained  Infection Prevention:   hand hygiene promoted   single patient room provided

## 2023-10-07 NOTE — Unmapped (Signed)
General Surgery - Daily Progress Note    Subjective:  NAEO, VSS, AF. Patient states her RUE and abdominal tenderness has not changed. Evidence of desquamation in R arm. No erythema or ecchymosis. Interval CTA RUE obtained. Plan for tunnel line placement.    Objective:    Vital signs in last 24 hours:  Temp:  [36.9 ??C (98.4 ??F)-37.4 ??C (99.3 ??F)] 37.4 ??C (99.3 ??F)  Heart Rate:  [85-107] 85  Resp:  [18] 18  BP: (121-149)/(68-75) 121/68  MAP (mmHg):  [85-96] 85  SpO2:  [97 %-99 %] 97 %    Intake/Output last 3 shifts:  No intake/output data recorded.  Intake/Output this shift:  No intake/output data recorded.    Lab Results   Component Value Date    WBC 8.4 10/07/2023    HGB 9.9 (L) 10/07/2023    HCT 32.1 (L) 10/07/2023    PLT 268 10/07/2023     Lab Results   Component Value Date    NA 143 10/07/2023    K 3.8 10/07/2023    CL 104 10/07/2023    CO2 23.6 10/07/2023    BUN 24 (H) 10/07/2023    CREATININE 9.32 (H) 10/07/2023    GLU 83 10/07/2023    CALCIUM 7.8 (L) 10/07/2023    MG 1.8 10/07/2023    PHOS 4.1 10/07/2023     Lab Results   Component Value Date    BILITOT 0.2 (L) 10/02/2023    BILIDIR 0.30 08/11/2020    PROT 7.0 10/02/2023    ALBUMIN 3.2 (L) 10/02/2023    ALT 17 10/02/2023    AST 31 10/02/2023    ALKPHOS 95 10/02/2023     Lab Results   Component Value Date    PT 11.4 10/02/2023    INR 1.00 10/02/2023    APTT 30.8 03/23/2022      Imaging:  CTA Upper Extremity 10/05/2023: report pending, images reviewed with Dr. Carlynn Purl, no air visualized concerning for infection  CT Abdomen/Pelvis 10/05/2023:   Report notes -    Impression:  - Evolving postsurgical changes of peritoneal dialysis catheter removal.  - Enlarging intra-abdominal lymph nodes, likely reactive in the setting of recent surgical intervention.  - Moderate rectal stool ball.  - Small bilateral pleural effusions.    Physical Exam:  General Appearance:    Alert, cooperative, no distress, appears stated age   Head:    Normocephalic, without obvious abnormality, atraumatic   Eyes:    Sclera non-icteric, EOM's intact   Ears:    Normal external ear canals, both ears   Nose:   Nares normal   Throat:   Lips normal   Lungs:     Respirations unlabored, on room air    Heart:    Regular rate and rhythm    Abdomen:     Soft, tender in left lower quadrant, focally at incisions (improved)   Extremities/Skin:   RUE erythema improved, right AV graft with palpable thrill, palpable radial pulse   Neurologic:   CNII-XII grossly intact     Assessment/Plan:  52 year old female with past medical history of ESRD (status post right brachioaxillary arc arteriovenous graft on 08/15/2023 with Dr. Darlin Drop and status post PD catheter removal by IR on 10/01/2023), anxiety, and chronic pain.   She was admitted with concerns of cellulitis at AVG site in RUE and abdominal infection.   She has been undergoing dialysis via left internal jugular catheter. CT of the RUE showed a 6 mm pseudoaneurysm of  AVG.    - routine monitoring of pseudoaneurysm with PVLs  - Continue antibiotics.   - Agree with insertion of tunneled line  - Will continue to follow.   Teaching Surgeon Attestation:  I, Dyann Ruddle, M.D., saw and evaluated the patient. I have reviewed and edited the above note, and I agree with the findings and the plan of care as documented in the Resident's note.    The arm seems to be overly tender when compared to the external appearance and exam. I reviewed the CT. May need circuit study once we are confident infection has been treated. WBC has normalized rapidly and she remains afebrile. Abdomen does not seem to be an issue based on radiography or exam, however remains quite tender in the area as well

## 2023-10-07 NOTE — Unmapped (Signed)
Patient is seen in consultation at the request of Al Corpus, MD with Family Medicine [facility]) for possible placement of a central venous access device.    Assessment & Recommendations     Megan Robertson is a 52 y.o. female with PMH of ESRD on HD (M/W/F) who is now admitted due to concern for cellulitis overlying new right brachioaxillary AV arc graft, s/p left sided nontunneled trialysis line placed on 12/12. VIR has been consulted for conversion of current non-tunneled HD line to tunneled HD line/placement of tunneled HD line.     - VIR recommends conversion of current non-tunneled HD line or placement of a Dialysis - Tunneled.  - Anticipated procedure date: 12/17, pending VIR schedule  - Expected discharge date: TBD   - Sedation plan: Moderate sedation  - NPO at midnight the night prior to procedure.  - This is a low bleeding risk procedure.  INR <= 3.0  Platelets >= 20  Anticoagulation does not need to be held.  - Iodinated contrast allergy: No.  - Antibiotics needed: No  - Pending blood cultures: Yes. Per [facility] policy, blood cultures must be negative for 48 hours prior to placing a tunneled central venous catheter.    This plan was discussed with Dr. Bing Quarry Pisanie.    Informed Consent  Yes; this procedure has been fully reviewed with the patient. This included a discussion of the risks, benefits, and alternatives including but not limited to bleeding, infection, and/or damage to surrounding structures. All questions were answered to their satisfaction, and they would like to proceed with the procedure.   --The patient will accept blood products in an emergent situation.  --The patient does not have a Do Not Resuscitate order in effect.    Consent obtained by Cranford Mon, ACNP, witnessed, and scanned into patient's chart (see media tab).    Thank you for involving [place] in the care of this patient. Please page the VIR consult pager [phone]) with further questions, concerns, or if new issues arise. Subjective     History of Present Illness:  Megan Robertson is a 52 y.o. female with PMH of ESRD on HD (M/W/F) who is now admitted due to concern for cellulitis overlying new right brachioaxillary AV arc graft (created by [facility] 08/15/23), s/p left sided nontunneled trialysis line placed on 12/12.  VIR has been consulted for conversion of current nontunneled HD line to tunneled HD line/placement of tunneled HD line while awaiting intervention on AVG.     Most recent blood cultures from 12/11 at [facility]. Pt is afebrile and hypertensive but otherwise HDS on room air. There is no leukocytosis. On ASA and Eliquis for hs clotted AVF, PLT 268. Cr from today is 9.32. HD on MWF.     Review of Systems: Pertinent items are noted in HPI.    Past Medical History:  Past Medical History:   Diagnosis Date    Abnormal mammogram     Abnormal Pap smear of cervix     Allergic     Anemia     Anxiety     Arthritis     Asthma     Back pain     Cancer (CMS-HCC) Ovarian    Depression     Eczema     ESRD (end stage renal disease) on dialysis (CMS-HCC)     FSGS (focal segmental glomerulosclerosis) 1997    renal biopsy at [facility]    GERD (gastroesophageal reflux disease)     Gout  H/O adenoidectomy     had adenoids removed    Hypercholesteremia     Hypertension     Keloid     Ovarian cancer (CMS-HCC)     Pancreatitis     Psoriasis     QT prolongation 06/04/2023    Seizure (CMS-HCC)     unknown etiology; none for several years    Sepsis (CMS-HCC) 09/16/2022    Stroke (CMS-HCC) 2000    Tachycardia 07/31/2020       Past Surgical History:  Past Surgical History:   Procedure Laterality Date    APPENDECTOMY      BREAST EXCISIONAL BIOPSY Right 11/28/2016    CHEMOTHERAPY      for ovarian CA in 2000    CHG US GUIDE, VASCULAR ACCESS N/A 01/29/2020    Procedure: ULTRASOUND GUIDANCE FOR VASC ACCESS REQUIRING [place] EVAL OF POTENTIAL ACCESS SITES;  Surgeon: Leona Carry, MD;  Location: MAIN OR [facility];  Service: Transplant    CHG US GUIDE, VASCULAR ACCESS N/A 06/08/2020    Procedure: ULTRASOUND GUIDANCE FOR VASC ACCESS REQUIRING [place] EVAL OF POTENTIAL ACCESS SITES;  Surgeon: Leona Carry, MD;  Location: MAIN OR [facility];  Service: Transplant    CHOLECYSTECTOMY      COMBINED HYSTEROSCOPY DIAGNOSTIC / D&C  07/07/2015    Planned for endometrial ablation, but patient had uterine perforation after D&C and unable to perform    HYSTERECTOMY      LEFT OOPHORECTOMY Left 04/12/2000    Removed with L fallopian tube for ectopic pregnancy    OOPHORECTOMY      PR ANASTOMOSIS,AV,ANY SITE Right 08/15/2023    Procedure: ARTERIOVENOUS ANASTOMOSIS, OPEN; DIRECT, ANY SITE, UPPER EXTREMITY;  Surgeon: Dyann Ruddle, MD;  Location: [facility] OR [facility];  Service: General Surgery    PR COLONOSCOPY W/BIOPSY SINGLE/MULTIPLE  09/12/2021    Procedure: COLONOSCOPY, FLEXIBLE, PROXIMAL TO SPLENIC FLEXURE; WITH BIOPSY, SINGLE OR MULTIPLE;  Surgeon: Maris Berger, MD;  [facility]: [facility];  Service: Gastroenterology    PR COLSC FLX W/RMVL OF TUMOR POLYP LESION SNARE TQ N/A 09/12/2021    Procedure: COLONOSCOPY FLEX; W/REMOV TUMOR/LES BY SNARE;  Surgeon: Maris Berger, MD;  Location: [facility]URES [facility];  Service: Gastroenterology    PR CREAT AV FISTULA,AUTOGENOUS GRAFT Left 01/29/2020    Procedure: Create Av Fistula (Separt Proc); Autog Gft;  Surgeon: Leona Carry, MD;  Location: MAIN OR [facility];  Service: Transplant    PR CREAT AV FISTULA,NON-AUTOGENOUS GRAFT Left 06/08/2020    Procedure: CREATE AV FISTULA (SEPARATE PROC); NONAUTOGENOUS GRAFT (EG, BIOLOGICAL COLLAGEN, THERMOPLASTIC GRAFT);  Surgeon: Leona Carry, MD;  Location: MAIN OR [facility];  Service: Transplant    PR EXPLORATION N/FLWD SURG UPPER EXTREMITY ARTERY Left 08/02/2020    Procedure: Exploration Not Followed By Surgical Repair, Artery; Upper Extremity (Eg, Axillary, Brachial, Radial, Ulnar);  Surgeon: Earney Mallet, MD;  Location: MAIN OR [facility];  Service: Vascular    PR INCIS/DRAIN ARM,DEEP ABSC/HEMATOMA Left 08/14/2020    Procedure: INCISION AND DRAINAGE, UPPER ARM OR ELBOW AREA; DEEP ABSCESS OR HEMATOMA;  Surgeon: Earney Mallet, MD;  Location: MAIN OR [place];  Service: Vascular    PR INSERTION TUNNEL INTRAPERITONEAL CATH DIAL OPEN Midline 02/01/2021    Procedure: INSERTION OF INTRAPERITONEAL CANNULA OR CATHETER FOR DRAINAGE OR DIALYSIS; PERMANENT;  Surgeon: Loney Hering, MD;  Location: MAIN OR [place];  Service: Transplant    PR REMV ART CLOT AXILL-BRACH,ARM INCIS Left 08/02/2020    Procedure: Embolect/Thrombec; Axilry/Brachial Art-Arm Incs;  Surgeon: Earney Mallet, MD;  Location: MAIN OR [facility];  Service: Vascular    PR UPPER GI ENDOSCOPY,BIOPSY N/A 08/29/2017    Procedure: UGI ENDOSCOPY; WITH BIOPSY, SINGLE OR MULTIPLE;  Surgeon: Neysa Hotter, MD;  Location: GI PROCEDURES [place] [facility];  Service: Gastroenterology    PR VEIN BYPASS GRAFT,SUBCL-BRACHIAL Left 08/05/2020    Procedure: Bypass Graft, With Vein; Subclavian-Brachial;  Surgeon: Earney Mallet, MD;  Location: MAIN OR [facility];  Service: Vascular    SALPINGECTOMY Left 04/12/2000    Ectopic pregnancy    SALPINGECTOMY Right 11/22/2015    at time of total vaginal hysterectomy    TONSILECTOMY, ADENOIDECTOMY, BILATERAL MYRINGOTOMY AND TUBES      TOTAL VAGINAL HYSTERECTOMY  11/22/2015    With R salpingectomy       Allergies:  Allergies   Allergen Reactions    Cephalexin Itching     unknown; tolerates cefepime    Mango Itching    Peach (Prunus Persica) Itching    Wellbutrin [Bupropion Hcl] Other (See Comments)     Seizure    Egg Itching    Fish Containing Products Itching    Lactase-Rennet Nausea And Vomiting    Lexiscan [Regadenoson] Nausea And Vomiting     Patient had nausea after the Lexi then vomitted [id number]    Mushroom Itching        Objective     Physical Exam:  Vitals:    10/07/23 0735   BP: 131/75   Pulse: 104   Resp: 18   Temp: 36.3 ??C (97.3 ??F)   SpO2: 100%        General: alert, in no acute distress  Respiratory: breathing comfortably on room air  Neurologic: grossly normal  Left sided nontunneled trialysis line with sterile dressing, C/D/I and no drainage.     Airway assessment: Class 2 - Can visualize soft palate and fauces, tip of uvula is obscured    ASA 3 - Patient with moderate systemic disease with functional limitations    Pertinent Labs:  Recent Labs     10/05/23  0607 10/06/23  0607 10/07/23  0605   WBC 7.7 7.5 8.4   HGB 9.9* 9.6* 9.9*   HCT 31.1* 30.5* 32.1*   PLT 204 250 268       Recent Labs     10/05/23  0607 10/06/23  0607 10/07/23  0606   NA 140 144 143   K 3.8 3.9 3.8   CL 102 102 104   BUN 13 21 24*   CREATININE 5.98* 7.96* 9.32*   GLU 75 77 83   Estimated Creatinine Clearance: 7 mL/min (A) (based on SCr of 9.32 mg/dL (H)).     No results for input(s): INR, APTT, PTINR, FIBRINOGEN in the last 72 hours.    No results for input(s): PROT, ALBUMIN, AST, ALT, ALKPHOS, BILITOT, BILIDIR in the last 72 hours.    Lab Results   Component Value Date    LABBLOO No Growth at 4 days 10/02/2023    LABBLOO No Growth at 5 days 06/23/2023    LABBLOO No Growth at 5 days 06/22/2023    LABBLOO No Growth at 5 days 06/04/2023       Pertinent Imaging: No relevant imaging was available for review.

## 2023-10-07 NOTE — Unmapped (Signed)
Family Medicine Inpatient Service  Progress Note    Team: Family Medicine [facility] (pgr [phone])    Hospital Day: 3    ASSESSMENT / PLAN:   Megan Robertson is a 52 y.o. female with a past medical history significant for ESRD, anxiety, and chronic pain, who presented to [facility] ED on 12/11 on the advice of her nephrologist with concern for cellulitis of right arm over her fistula site, also reports abdominal pain from recent peritoneal dialysis catheter removal site (removed 12/10). Continued LLQ and RUE pain on palpation.    # Cellulitis  # Abdominal Pain, improving  Presented with erythema, edema, and pain over right AV fistula site and abdominal pain and warmth near PD removal site, Nephrologist concerned for AVG site cellulitis. Right AVG site placed by General Surgery 08/15/2023 due to poor response with peritoneal dialysis. PD catheter and right internal jugular catheter were both removed on 12/10 without complication, given injection of vancomycin that day. States pain at her right AVG site is at baseline. S/p CTX, vancomycin, and zosyn in ED for concern for peritoneal infection. S/p temporary LIJ non-tunneled trialysis with VIR on 12/12 PM. VS have remained stable, afebrile, intermittently tachycardic. Initial exam notable for erythema to skin diffusely (arms, abdomen, chest) and itching, which has improved on exam 12/15 now with mild skin peeling. Continues to have abdominal tenderness in LLQ, but slowly improving. Prior PD site and RIJ catheter sites well healing, and new LIJ site c/d/I. Repeat CTAP 12/14 with evolving post-surgical changes, enlarging abdominal lymph nodes likely reactive, and moderate rectal stool ball.   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  Venous outflow is diminutive adjacent to the venous anastamosis   - Routine monitoring of pseudoaneurysm with PVLs   - VIR consulted, recommendations appreciated   - Tunneled line today vs tomorrow (12/16 vs 12/17)  - Transition from IV ceftriaxone 1 g daily to Linezolid 600 mg q12h to complete 14 day course   - Vancomycin level 23.5 12/16 iso received total of 3g 12/10-11  - Start linezolid pending vancomycin level < 15; linezolid should be prescribed as part of 2-week antibiotic course (12/11-12/25)  - Blood culture (12/11) [facility] at 4 days  - Daily CBC    # ESRD on hemodialysis   # Hyperphosphatemia - Secondary Hyperparathyroidism  HD on MWF. Dialyzed Monday 12/9, did not receive dialysis Wednesday 12/11 due to concern for cellulitis of right AVG as above. Received hemodialysis Friday 12/13 through temporary LIJ CVC.  - Nephrology consulted, recommendations appreciated  - VIR consulted, recommendations appreciated   - Tunnel line placement today vs tomorrow (12/16 vs 12/17)   - NPO at midnight 12/17   - tPA if LIJ CVC sluggish  - Daily BMP, Phos, Mg  - Continue sevelamer 2400 mg PO TID with meals  - Home ferric citrate not on formulary, will work towards getting approval if staying beyond 12/16  - Continue calcitriol 0.5 mcg daily and cinacalcet 60 mg daily  - Strict I&Os    # Hx of Normocytic Anemia   Hgb 11.6 on admission; down to 9.9 today (12/16). Receives Epo with dialysis   - Daily CBC    # Itching, improved  # Eczema  Patient reported body itching on night of admission, improved with hydroxyzine. Reports itching as a reaction to antibiotics in  the past. Do wonder if erythema and itching could have partly been a reaction to vancomycin as improving and vancomycin level is decreasing.   - Hydroxyzine 25 mg PO q6h PRN  - Continue lotion and petroleum jelly for dry, peeling skin  - Nystatin topical BID under breasts     # Hx of clotted AVF   06/2023 had an AVF of the left brachiocephalic vein. PVL 12/12 suggests the right brachioaxillary AVG is patent throughout, demonstrates a non-vascularized mixed echogenic area adjacent to the arterial anastomosis.   - Continue home Eliquis 2.5 mg   [ ]  OP consideration of whether still needs both ASA and eliquis      Chronic and Stable Conditions:  # Asthma: Continue home albuterol prn, Anuity Ellipta 100 mcg daily, CPAP nightly   # Anxiety: Continue home Mirtazapine 15 mg, PO, nightly      # Checklist  - IVF: None  - Daily labs needed: CBC, BMP, Magnesium, and Phosphorus  - Currently Regular diet  - Bowel Regimen: Senna 2 tabs qHS, miralax BID  - DVT: Continue home anticoagulation with Eliquis  - Code Status:   Orders Placed This Encounter   Procedures    Full Code     Standing Status:   Standing     Number of Occurrences:   1     - Dispo: Floor    [ ]  Anticipated Discharge Location: Home  [ ]  PT/OT/DME: No needs anticipated  [ ]  CM/SW needs:  Continued assistance for Medicaid/Medicare needs  [ ]  Follow up appt:  Needs PCP, has General Surgery 12/17    SUBJECTIVE:  Interval events: NAEON. Patient reports continued abdominal pain with palpation and continued RUE pain with movement. Otherwise feel overall well. Denies fevers or chills. Skin is a bit itchy, but improved from prior.    REVIEW OF SYSTEMS:  Pertinent positives and negatives per HPI. A complete review of systems otherwise negative.    PHYSICAL EXAM:    No intake or output data in the 24 hours ending 10/07/23 1041        Recent Vitals:  Vitals:    10/07/23 0735   BP: 131/75   Pulse: 104   Resp: 18   Temp: 36.3 ??C (97.3 ??F)   SpO2: 100%       GEN: Well appearing, inclined in bed, NAD  HEENT: NCAT, No scleral icterus. Conjunctiva non-erythematous. MMM.  CV: Tachycardic with regular rhythm. No murmurs/rubs/gallops. Prior tunneled line bandage c/d/I, LIJ trialysis line c/d/I without erythema, edema, or purulence.   Pulm: Normal work of breathing on room air. CTAB. No wheezing, crackles, or rhonchi.  Abd: Flat. Tender to palpation in LLQ.  No rebound tenderness or peritoneal signs. Normoactive bowel sounds.  Neuro: A&O x 3. No focal deficits.  Ext: No peripheral edema. Palpable distal pulses. Tenderness to palpation of RUE near AVG site.  Skin: Continued improved erythema to bilateral upper arms, chest, abdomen, thighs; continued skin peeling      LABS/ STUDIES:    All imaging, laboratory studies, and other pertinent tests including electrocardiography within the last 24 hours were reviewed and are summarized within the assessment and plan.     NUTRITION:                       [place], MS3    Cherie Dark, MD  Resident Physician Family Medicine, PGY-2

## 2023-10-08 ENCOUNTER — Ambulatory Visit: Admit: 2023-10-08 | Payer: MEDICARE

## 2023-10-08 LAB — MAGNESIUM: MAGNESIUM: 1.8 mg/dL (ref 1.6–2.6)

## 2023-10-08 LAB — CBC
HEMATOCRIT: 30.4 % — ABNORMAL LOW (ref 34.0–44.0)
HEMOGLOBIN: 9.5 g/dL — ABNORMAL LOW (ref 11.3–14.9)
MEAN CORPUSCULAR HEMOGLOBIN CONC: 31.1 g/dL — ABNORMAL LOW (ref 32.0–36.0)
MEAN CORPUSCULAR HEMOGLOBIN: 24.4 pg — ABNORMAL LOW (ref 25.9–32.4)
MEAN CORPUSCULAR VOLUME: 78.5 fL (ref 77.6–95.7)
MEAN PLATELET VOLUME: 6.5 fL — ABNORMAL LOW (ref 6.8–10.7)
PLATELET COUNT: 262 10*9/L (ref 150–450)
RED BLOOD CELL COUNT: 3.88 10*12/L — ABNORMAL LOW (ref 3.95–5.13)
RED CELL DISTRIBUTION WIDTH: 20.3 % — ABNORMAL HIGH (ref 12.2–15.2)
WBC ADJUSTED: 9 10*9/L (ref 3.6–11.2)

## 2023-10-08 LAB — BASIC METABOLIC PANEL
ANION GAP: 13 mmol/L (ref 5–14)
BLOOD UREA NITROGEN: 26 mg/dL — ABNORMAL HIGH (ref 9–23)
BUN / CREAT RATIO: 3
CALCIUM: 7.9 mg/dL — ABNORMAL LOW (ref 8.7–10.4)
CHLORIDE: 105 mmol/L (ref 98–107)
CO2: 24.6 mmol/L (ref 20.0–31.0)
CREATININE: 10.04 mg/dL — ABNORMAL HIGH (ref 0.55–1.02)
EGFR CKD-EPI (2021) FEMALE: 4 mL/min/{1.73_m2} — ABNORMAL LOW (ref >=60)
GLUCOSE RANDOM: 86 mg/dL (ref 70–99)
POTASSIUM: 3.9 mmol/L (ref 3.4–4.8)
SODIUM: 143 mmol/L (ref 135–145)

## 2023-10-08 LAB — PHOSPHORUS: PHOSPHORUS: 5.4 mg/dL — ABNORMAL HIGH (ref 2.4–5.1)

## 2023-10-08 LAB — VANCOMYCIN, RANDOM: VANCOMYCIN RANDOM: 22.7 ug/mL

## 2023-10-08 MED ORDER — MIRTAZAPINE 15 MG TABLET
ORAL_TABLET | Freq: Every evening | ORAL | 0 refills | 30.00 days | Status: CP
Start: 2023-10-08 — End: 2023-11-07
  Filled 2023-10-09: qty 30, 30d supply, fill #0

## 2023-10-08 MED ADMIN — midazolam (VERSED) injection: INTRAVENOUS | @ 17:00:00 | Stop: 2023-10-08

## 2023-10-08 MED ADMIN — calcitriol (ROCALTROL) capsule 0.5 mcg: .5 ug | ORAL | @ 14:00:00

## 2023-10-08 MED ADMIN — mirtazapine (REMERON) tablet 15 mg: 15 mg | ORAL | @ 01:00:00

## 2023-10-08 MED ADMIN — heparin (porcine) 1000 unit/mL injection: INTRAVENOUS | @ 17:00:00 | Stop: 2023-10-08

## 2023-10-08 MED ADMIN — fentaNYL (PF) (SUBLIMAZE) injection: INTRAVENOUS | @ 17:00:00 | Stop: 2023-10-08

## 2023-10-08 MED ADMIN — sevelamer (RENVELA) tablet 2,400 mg: 2400 mg | ORAL | @ 01:00:00

## 2023-10-08 MED ADMIN — lidocaine (XYLOCAINE) 10 mg/mL (1 %) injection: SUBCUTANEOUS | @ 17:00:00 | Stop: 2023-10-08

## 2023-10-08 MED ADMIN — aspirin chewable tablet 81 mg: 81 mg | ORAL | @ 14:00:00

## 2023-10-08 MED ADMIN — apixaban (ELIQUIS) tablet 2.5 mg: 2.5 mg | ORAL | @ 01:00:00

## 2023-10-08 MED ADMIN — ondansetron (ZOFRAN) injection 4 mg: 4 mg | INTRAVENOUS | @ 20:00:00

## 2023-10-08 MED ADMIN — ceFAZolin (ANCEF) IVPB 1 g in 50 mL dextrose (premix): 1 g | INTRAVENOUS | @ 17:00:00 | Stop: 2023-10-08

## 2023-10-08 MED ADMIN — apixaban (ELIQUIS) tablet 2.5 mg: 2.5 mg | ORAL | @ 14:00:00

## 2023-10-08 MED ADMIN — fluticasone furoate (ARNUITY ELLIPTA) 100 mcg/actuation inhaler 1 puff: 1 | RESPIRATORY_TRACT | @ 15:00:00

## 2023-10-08 MED ADMIN — cinacalcet (SENSIPAR) tablet 60 mg: 60 mg | ORAL | @ 14:00:00

## 2023-10-08 MED ADMIN — gentamicin-sodium citrate lock solution in NS: 1.4 mL | @ 22:00:00

## 2023-10-08 MED ADMIN — acetaminophen (TYLENOL) tablet 650 mg: 650 mg | ORAL | @ 23:00:00

## 2023-10-08 NOTE — Unmapped (Signed)
General Surgery - Daily Progress Note    Subjective:  NAEO, VSS, AF. Patient endorses improvement in abdominal tenderness but no changes to tenderness in R arm. Plan for tunnel line placement today.     Objective:    Vital signs in last 24 hours:  Temp:  [36.4 ??C (97.5 ??F)-37.4 ??C (99.3 ??F)] 36.7 ??C (98.1 ??F)  Heart Rate:  [99-107] 99  Resp:  [18] 18  BP: (81-143)/(51-74) 125/73  MAP (mmHg):  [64-87] 87  FiO2 (%):  [21 %] 21 %  SpO2:  [100 %] 100 %    Intake/Output last 3 shifts:  No intake/output data recorded.  Intake/Output this shift:  I/O this shift:  In: 60 [P.O.:60]  Out: -     Lab Results   Component Value Date    WBC 9.0 10/08/2023    HGB 9.5 (L) 10/08/2023    HCT 30.4 (L) 10/08/2023    PLT 262 10/08/2023     Lab Results   Component Value Date    NA 143 10/08/2023    K 3.9 10/08/2023    CL 105 10/08/2023    CO2 24.6 10/08/2023    BUN 26 (H) 10/08/2023    CREATININE 10.04 (H) 10/08/2023    GLU 86 10/08/2023    CALCIUM 7.9 (L) 10/08/2023    MG 1.8 10/08/2023    PHOS 5.4 (H) 10/08/2023     Lab Results   Component Value Date    BILITOT 0.2 (L) 10/02/2023    BILIDIR 0.30 08/11/2020    PROT 7.0 10/02/2023    ALBUMIN 3.2 (L) 10/02/2023    ALT 17 10/02/2023    AST 31 10/02/2023    ALKPHOS 95 10/02/2023     Lab Results   Component Value Date    PT 11.4 10/02/2023    INR 1.00 10/02/2023    APTT 30.8 03/23/2022      Imaging:  CTA Upper Extremity 10/05/2023: report pending, images reviewed with Dr. Carlynn Purl, no air visualized concerning for infection  CT Abdomen/Pelvis 10/05/2023:   Report notes -    Impression:  - Evolving postsurgical changes of peritoneal dialysis catheter removal.  - Enlarging intra-abdominal lymph nodes, likely reactive in the setting of recent surgical intervention.  - Moderate rectal stool ball.  - Small bilateral pleural effusions.    Physical Exam:  General Appearance:    Alert, cooperative, no distress, appears stated age   Head:    Normocephalic, without obvious abnormality, atraumatic Eyes:    Sclera non-icteric, EOM's intact   Ears:    Normal external ear canals, both ears   Nose:   Nares normal   Throat:   Lips normal   Lungs:     Respirations unlabored, on room air    Heart:    Regular rate and rhythm    Abdomen:     Soft, tender in left lower quadrant, focally at incisions (improved)   Extremities/Skin:   RUE erythema improved, right AV graft with palpable thrill, palpable radial pulse, seems less tender when she is distracted during exam   Neurologic:   CNII-XII grossly intact     Assessment/Plan:  52 year old female with past medical history of ESRD (status post right brachioaxillary arc arteriovenous graft on 08/15/2023 with Dr. Darlin Drop and status post PD catheter removal by IR on 10/01/2023), anxiety, and chronic pain.   She was admitted with concerns of cellulitis at AVG site in RUE and abdominal infection.   She has been undergoing dialysis via  left internal jugular catheter. 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    - Agree with transitioning to PO abx  - OK to DC from surgery perspective after dialysis  - routine monitoring of pseudoaneurysm with PVLs  - Agree with insertion of tunneled line  - Will continue to follow.

## 2023-10-08 NOTE — Unmapped (Signed)
Pt wore cpap for a few hours overnight.

## 2023-10-08 NOTE — Unmapped (Signed)
Patient switched from NPO to regular diet this shift due to not being taken to VIR for tunnel cath. Patient is to be NPO at midnight for planned VIR tomorrow. No other acute changes this shift.     Problem: Adult Inpatient Plan of Care  Goal: Plan of Care Review  Outcome: Ongoing - Unchanged  Goal: Patient-Specific Goal (Individualized)  Outcome: Ongoing - Unchanged  Goal: Absence of Hospital-Acquired Illness or Injury  Outcome: Ongoing - Unchanged  Intervention: Identify and Manage Fall Risk  Recent Flowsheet Documentation  Taken 10/07/2023 1800 by Gaye Pollack, RN  Safety Interventions:   fall reduction program maintained   lighting adjusted for tasks/safety   low bed   nonskid shoes/slippers when out of bed  Taken 10/07/2023 1600 by Gaye Pollack, RN  Safety Interventions:   fall reduction program maintained   lighting adjusted for tasks/safety   low bed   nonskid shoes/slippers when out of bed  Taken 10/07/2023 1400 by Gaye Pollack, RN  Safety Interventions:   fall reduction program maintained   lighting adjusted for tasks/safety   low bed   nonskid shoes/slippers when out of bed  Taken 10/07/2023 1200 by Gaye Pollack, RN  Safety Interventions:   fall reduction program maintained   lighting adjusted for tasks/safety   low bed   nonskid shoes/slippers when out of bed  Taken 10/07/2023 1000 by Gaye Pollack, RN  Safety Interventions:   fall reduction program maintained   lighting adjusted for tasks/safety   low bed   nonskid shoes/slippers when out of bed  Taken 10/07/2023 0800 by Gaye Pollack, RN  Safety Interventions:   fall reduction program maintained   lighting adjusted for tasks/safety   low bed   nonskid shoes/slippers when out of bed  Intervention: Prevent Infection  Recent Flowsheet Documentation  Taken 10/07/2023 1800 by Gaye Pollack, RN  Infection Prevention:   hand hygiene promoted   rest/sleep promoted   single patient room provided  Taken 10/07/2023 1600 by Gaye Pollack, RN  Infection Prevention:   hand hygiene promoted   rest/sleep promoted   single patient room provided  Taken 10/07/2023 1400 by Gaye Pollack, RN  Infection Prevention:   hand hygiene promoted   rest/sleep promoted   single patient room provided  Taken 10/07/2023 1200 by Gaye Pollack, RN  Infection Prevention:   hand hygiene promoted   rest/sleep promoted   single patient room provided  Taken 10/07/2023 1000 by Gaye Pollack, RN  Infection Prevention:   hand hygiene promoted   rest/sleep promoted   single patient room provided  Taken 10/07/2023 0800 by Gaye Pollack, RN  Infection Prevention:   hand hygiene promoted   rest/sleep promoted   single patient room provided  Goal: Optimal Comfort and Wellbeing  Outcome: Ongoing - Unchanged  Goal: Readiness for Transition of Care  Outcome: Ongoing - Unchanged  Goal: Rounds/Family Conference  Outcome: Ongoing - Unchanged     Problem: Fall Injury Risk  Goal: Absence of Fall and Fall-Related Injury  Outcome: Ongoing - Unchanged  Intervention: Promote Injury-Free Environment  Recent Flowsheet Documentation  Taken 10/07/2023 1800 by Gaye Pollack, RN  Safety Interventions:   fall reduction program maintained   lighting adjusted for tasks/safety   low bed   nonskid shoes/slippers when out of bed  Taken 10/07/2023 1600 by Gaye Pollack, RN  Safety Interventions:   fall reduction program maintained   lighting adjusted for tasks/safety  low bed   nonskid shoes/slippers when out of bed  Taken 10/07/2023 1400 by Gaye Pollack, RN  Safety Interventions:   fall reduction program maintained   lighting adjusted for tasks/safety   low bed   nonskid shoes/slippers when out of bed  Taken 10/07/2023 1200 by Gaye Pollack, RN  Safety Interventions:   fall reduction program maintained   lighting adjusted for tasks/safety   low bed   nonskid shoes/slippers when out of bed  Taken 10/07/2023 1000 by Gaye Pollack, RN  Safety Interventions:   fall reduction program maintained   lighting adjusted for tasks/safety   low bed   nonskid shoes/slippers when out of bed  Taken 10/07/2023 0800 by Gaye Pollack, RN  Safety Interventions:   fall reduction program maintained   lighting adjusted for tasks/safety   low bed   nonskid shoes/slippers when out of bed     Problem: Wound  Goal: Optimal Coping  Outcome: Ongoing - Unchanged  Goal: Optimal Functional Ability  Outcome: Ongoing - Unchanged  Goal: Absence of Infection Signs and Symptoms  Outcome: Ongoing - Unchanged  Intervention: Prevent or Manage Infection  Recent Flowsheet Documentation  Taken 10/07/2023 1800 by Gaye Pollack, RN  Infection Management: aseptic technique maintained  Taken 10/07/2023 1600 by Gaye Pollack, RN  Infection Management: aseptic technique maintained  Taken 10/07/2023 1400 by Gaye Pollack, RN  Infection Management: aseptic technique maintained  Taken 10/07/2023 1200 by Gaye Pollack, RN  Infection Management: aseptic technique maintained  Taken 10/07/2023 1000 by Gaye Pollack, RN  Infection Management: aseptic technique maintained  Taken 10/07/2023 0800 by Gaye Pollack, RN  Infection Management: aseptic technique maintained  Goal: Improved Oral Intake  Outcome: Ongoing - Unchanged  Goal: Optimal Pain Control and Function  Outcome: Ongoing - Unchanged  Goal: Skin Health and Integrity  Outcome: Ongoing - Unchanged  Goal: Optimal Wound Healing  Outcome: Ongoing - Unchanged     Problem: Hemodialysis  Goal: Safe, Effective Therapy Delivery  Outcome: Ongoing - Unchanged  Goal: Effective Tissue Perfusion  Outcome: Ongoing - Unchanged  Goal: Absence of Infection Signs and Symptoms  Outcome: Ongoing - Unchanged  Intervention: Prevent or Manage Infection  Recent Flowsheet Documentation  Taken 10/07/2023 1800 by Gaye Pollack, RN  Infection Management: aseptic technique maintained  Infection Prevention:   hand hygiene promoted   rest/sleep promoted   single patient room provided  Taken 10/07/2023 1600 by Gaye Pollack, RN  Infection Management: aseptic technique maintained  Infection Prevention:   hand hygiene promoted   rest/sleep promoted   single patient room provided  Taken 10/07/2023 1400 by Gaye Pollack, RN  Infection Management: aseptic technique maintained  Infection Prevention:   hand hygiene promoted   rest/sleep promoted   single patient room provided  Taken 10/07/2023 1200 by Gaye Pollack, RN  Infection Management: aseptic technique maintained  Infection Prevention:   hand hygiene promoted   rest/sleep promoted   single patient room provided  Taken 10/07/2023 1000 by Gaye Pollack, RN  Infection Management: aseptic technique maintained  Infection Prevention:   hand hygiene promoted   rest/sleep promoted   single patient room provided  Taken 10/07/2023 0800 by Gaye Pollack, RN  Infection Management: aseptic technique maintained  Infection Prevention:   hand hygiene promoted   rest/sleep promoted   single patient room provided

## 2023-10-08 NOTE — Unmapped (Signed)
[facility] - Operative Note     VIR Post-Procedure Note    Procedure Name: Conversion of a non-tunneled to a tunneled Hemodialysis catheter    Pre-Op Diagnosis: ESRD on HD    Post-Op Diagnosis: Same as pre-operative diagnosis    VIR Providers    Attending: none  Resident/Fellow/APP: Rikki Spearing, FNP-C    Description of procedure: Successful fluoroscopy guided conversion of a non-tunneled trialysis catheter to a tunneled, dual lumen 23 cm Hemodialysis catheter. Line has been heparin flushed and is ready for use.    Plan: Placement confirmed with fluroscopy; line is ready for use    Sedation:Moderate sedation    Estimated Blood Loss: approximately < 10 mL  Complications: None    See detailed procedure note with images in PACS ([email or web address]).    The patient tolerated the procedure well without incident or complication and left the room in stable condition.    Rikki Spearing, FNP-C  10/08/2023 12:29 PM

## 2023-10-08 NOTE — Unmapped (Signed)
Tmax 37.4C. Noted tachycardic in low 100s. BP noted low in LFA but stable when taken on LUA. Remain asymptomatic. Wounds are progressively healing. Independent with ADLs. No history of falling. Plan of care ongoing. Resolved Falls POC. No concerns at this time. Kept NPO past MN for possible procedure. Pending Hemodialysis today.     Problem: Adult Inpatient Plan of Care  Goal: Plan of Care Review  Outcome: Progressing  Goal: Patient-Specific Goal (Individualized)  Outcome: Progressing  Goal: Absence of Hospital-Acquired Illness or Injury  Outcome: Progressing  Intervention: Identify and Manage Fall Risk  Recent Flowsheet Documentation  Taken 10/07/2023 2200 by Hardie Pulley, RN  Safety Interventions: fall reduction program maintained  Taken 10/07/2023 2000 by Hardie Pulley, RN  Safety Interventions: fall reduction program maintained  Goal: Optimal Comfort and Wellbeing  Outcome: Progressing  Goal: Readiness for Transition of Care  Outcome: Progressing  Goal: Rounds/Family Conference  Outcome: Progressing     Problem: Wound  Goal: Optimal Coping  Outcome: Progressing  Goal: Optimal Functional Ability  Outcome: Progressing  Goal: Absence of Infection Signs and Symptoms  Outcome: Progressing  Goal: Improved Oral Intake  Outcome: Progressing  Goal: Optimal Pain Control and Function  Outcome: Progressing  Goal: Skin Health and Integrity  Outcome: Progressing  Goal: Optimal Wound Healing  Outcome: Progressing     Problem: Hemodialysis  Goal: Safe, Effective Therapy Delivery  Outcome: Progressing  Goal: Effective Tissue Perfusion  Outcome: Progressing  Goal: Absence of Infection Signs and Symptoms  Outcome: Progressing     Problem: Fall Injury Risk  Goal: Absence of Fall and Fall-Related Injury  10/07/2023 2303 by Hardie Pulley, RN  Outcome: Resolved  10/07/2023 2303 by Hardie Pulley, RN  Outcome: Progressing  Intervention: Promote Injury-Free Environment  Recent Flowsheet Documentation  Taken 10/07/2023 2200 by Hardie Pulley, RN  Safety Interventions: fall reduction program maintained  Taken 10/07/2023 2000 by Hardie Pulley, RN  Safety Interventions: fall reduction program maintained

## 2023-10-08 NOTE — Unmapped (Addendum)
Physician Discharge Summary [facility]  2 [facility]  [address]  [place] [address]  Dept: [phone]  Loc: [phone]     Identifying Information:   Megan Robertson  1971-10-19  [id number]    Primary Care Physician: Rudie Meyer, MD   Code Status: Full Code    Admit Date: 10/02/2023    Discharge Date: 10/09/2023     Discharge To: Home    Discharge Service: [facility] - [facility]     Discharge Attending Physician: Hewitt Shorts, MD    Discharge Diagnoses:  Principal Problem:    Cellulitis  Active Problems:    OSA (obstructive sleep apnea)    Anemia in chronic kidney disease    Coagulation defect, unspecified (CMS-HCC)    ESRD (end stage renal disease) on dialysis (CMS-HCC)    Secondary hyperparathyroidism of renal origin (CMS-HCC)    Itching      Outpatient Provider Follow Up Issues:   [ ]  OP consideration of whether still needs both ASA and eliquis  [ ]  Fu right AVG site appearance and pain  [ ]  Completion of antibiotics course  [ ]  Routine monitoring of pseudoaneurysm with PVLs     Hospital Course:     BRIELLE MORO is a 52 y.o. female with a past medical history significant for ESRD, anxiety, and chronic pain, who presented to [facility] ED on 12/11 on the advice of her nephrologist with concern for cellulitis of right arm over her fistula site, also reports abdominal pain from recent peritoneal dialysis catheter removal site (removed 12/10).     # Cellulitis  # Abdominal Pain, improving   Presented with erythema, edema, and pain over right AV fistula site and abdominal pain and warmth near PD removal site, Nephrologist concerned for AVG site cellulitis. Right AVG site placed by General Surgery 08/15/2023 due to poor response with peritoneal dialysis. PD catheter and right internal jugular catheter were both removed on 12/10 without complication. States pain at her right AVG site is at baseline. S/p CTX, vancomycin, and zosyn in ED for concern for peritoneal infection. S/p temporary LIJ non-tunneled trialysis with VIR on 12/12 PM. VS have remained stable, afebrile, intermittently tachycardic. Initial exam notable for erythema to skin diffusely (arms, abdomen, chest) and itching, which has improved on exam 12/15 now with mild skin peeling. Continues to have abdominal tenderness in LLQ, but slowly improving. Prior PD site and RIJ catheter sites well healing, and new LIJ site c/d/I. Repeat CTAP 12/14 with evolving post-surgical changes, enlarging abdominal lymph nodes likely reactive, and moderate rectal stool ball. Nephrology, General Surgery, and VIR consulted during this admission. Discharged with plan to continue PO linezolid 600 mg BID for total of 14 days (12/10-12/23) due concern for cellulitis though symptoms very likely due to vancomycin infusion reaction (see below)     # ESRD on hemodialysis   # Hyperphosphatemia - Secondary Hyperparathyroidism  HD on MWF, last dialyzed Monday (12/9). Did not receive dialysis 12/11 due to concern for cellulitis of right AVG as above. Consults as above. LIJ tunneled line placed 10/08/23. Initial concern for oozing from the site, resolved morning of discharge. Will continue outpatient dialysis with temporary line to give right AVG rest. Continue home sevelamer, ferric citrate, calcitriol and cinacalcet on discharge.     # Hx of Normocytic Anemia   Hgb 11.6 on admission; down to 9.9 today (12/14). Receives Epo with dialysis.     # Itching - Diffuse Erythema, improved  # Eczema  Patient reported body itching on night of admission, improved with hydroxyzine. Reports itching as a reaction to antibiotics in the past. Erythema and itching improved during admission without any evidence of anaphylactic features. Do wonder if erythema and itching could have partly been a reaction to vancomycin as improving as vancomycin level is decreasing. Received total of 3g Vancomycin (1g with outpatient procedure and 2g in ED), which could have caused her skin reaction. Allergy listed in chart     # Hx of clotted AVF   06/2023 had an AVF of the left brachiocephalic vein. PVL 12/12 suggests the right brachioaxillary AVG is patent throughout, demonstrates a non-vascularized mixed echogenic area adjacent to the arterial anastomosis.      Chronic and Stable Conditions:  # Asthma: Continue home albuterol prn, Anuity Ellipta 100 mcg daily, CPAP nightly   # Anxiety: Continue home Mirtazapine 15 mg, PO, nightly    Touchbase with Outpatient Provider:  Warm Handoff: Completed on 10/09/23 by Hewitt Shorts, MD  (Attending) via Jolyn Lent Message    Procedures:  Tunneled line placement 10/08/23  ______________________________________________________________________  Discharge Medications:     Your Medication List        STOP taking these medications      heparin (porcine) 1,000 unit/mL 1000 unit/mL injection            START taking these medications      linezolid 600 mg tablet  Commonly known as: ZYVOX  Take 1 tablet (600 mg total) by mouth every twelve (12) hours for 6 days.  Start taking on: October 10, 2023            CONTINUE taking these medications      albuterol 90 mcg/actuation inhaler  Commonly known as: PROVENTIL HFA;VENTOLIN HFA  Inhale 2 puffs every six (6) hours as needed for wheezing.     aspirin 81 MG chewable tablet  Chew 1 tablet (81 mg total) daily.     calcitriol 0.5 MCG capsule  Commonly known as: ROCALTROL  Take 4 capsules (2 mcg total) by mouth 3 (three) times a week.     cinacalcet 60 MG tablet  Commonly known as: SENSIPAR  Take 1 tablet (60 mg total) by mouth daily.     COSENTYX PEN (2 PENS) 150 mg/mL Pnij injection  Generic drug: secukinumab  Inject the contents of 2 pens (300 mg total) under the skin every twenty-eight (28) days. Maintenance dose.     ELIQUIS 2.5 mg Tab  Generic drug: apixaban  Take 1 tablet (2.5 mg total) by mouth two (2) times a day.     ferric citrate 210 mg iron Tab tablet  Commonly known as: AURYXIA  Take 2 tablets (420 mg total) by mouth Three (3) times a day with a meal.     furosemide 40 MG tablet  Commonly known as: LASIX  Take 1 tablet up to 3 times weekly as needed for leg swelling.     gentamicin 0.1 % ointment  Commonly known as: GARAMYCIN  Apply 1 Application topically daily. Apply to exit site daily with dressing change     MIRCERA INJ  Infuse 150 mcg into a venous catheter every fourteen (14) days.     mirtazapine 15 MG tablet  Commonly known as: REMERON  Take 1 tablet (15 mg total) by mouth nightly.     nystatin 100,000 unit/gram powder  Commonly known as: MYCOSTATIN  Apply under breasts two times daily until resolution     QVAR REDIHALER 80 mcg/actuation  inhaler  Generic drug: beclomethasone dipropionate  Inhale 1 puff  in the morning and 1 puff in the evening.     senna 8.6 mg tablet  Commonly known as: SENOKOT  Take 2 tablets by mouth daily.     sevelamer 800 mg tablet  Commonly known as: RENVELA  Take 3 tablets (2,400 mg total) by mouth Three (3) times a day with a meal.     tizanidine 2 MG tablet  Commonly known as: ZANAFLEX  Take 1 tablet (2 mg total) by mouth daily as needed.     triamcinolone 0.1 % ointment  Commonly known as: KENALOG  Apply topically two (2) times a day as needed. Use on psoriasis until flat.              Allergies:  Cephalexin, Mango, Peach (prunus persica), Wellbutrin [bupropion hcl], Egg, Fish containing products, Lactase-rennet, Lexiscan [regadenoson], and Mushroom  ______________________________________________________________________  Pending Test Results (if blank, then none):      Most Recent Labs:  All lab results last 24 hours -   Recent Results (from the past 24 hours)   Phosphorus Level    Collection Time: 10/09/23  5:49 AM   Result Value Ref Range    Phosphorus 4.0 2.4 - 5.1 mg/dL   Magnesium Level    Collection Time: 10/09/23  5:49 AM   Result Value Ref Range    Magnesium 1.7 1.6 - 2.6 mg/dL   Basic Metabolic Panel    Collection Time: 10/09/23  5:49 AM   Result Value Ref Range    Sodium 139 135 - 145 mmol/L Potassium 4.4 3.4 - 4.8 mmol/L    Chloride 101 98 - 107 mmol/L    CO2 21.1 20.0 - 31.0 mmol/L    Anion Gap 17 (H) 5 - 14 mmol/L    BUN 16 9 - 23 mg/dL    Creatinine 1.61 (H) 0.55 - 1.02 mg/dL    BUN/Creatinine Ratio 3     eGFR CKD-EPI (2021) Female 9 (L) >=60 mL/min/1.66m2    Glucose 64 (L) 70 - 179 mg/dL    Calcium 7.6 (L) 8.7 - 10.4 mg/dL   CBC    Collection Time: 10/09/23  5:49 AM   Result Value Ref Range    WBC 12.3 (H) 3.6 - 11.2 10*9/L    RBC 3.99 3.95 - 5.13 10*12/L    HGB 9.7 (L) 11.3 - 14.9 g/dL    HCT 09.6 (L) 04.5 - 44.0 %    MCV 79.7 77.6 - 95.7 fL    MCH 24.4 (L) 25.9 - 32.4 pg    MCHC 30.6 (L) 32.0 - 36.0 g/dL    RDW 40.9 (H) 81.1 - 15.2 %    MPV 6.8 6.8 - 10.7 fL    Platelet 228 150 - 450 10*9/L       Relevant Studies/Radiology (if blank, then none):  IR Insert Tunneled Catheter (Age Greater Than 5 Years)  Result Date: 10/08/2023  PROCEDURE: Conversion of non-tunneled to tunneled central venous catheter EXAM: IR INSERT TUNNELED CATHETER AGE GREATER THAN 5 YRS ACCESSION: [id number] UN REPORT DATE: 10/08/2023 2:59 PM Procedural Personnel Attending physician(s): None Resident physician(s): None Advanced practice provider(s): Rikki Spearing, FNP Pre-procedure diagnosis: ESRD Post-procedure diagnosis: Same Indication: Performance of hemodialysis Additional clinical history: None _______________________________________________________________ PROCEDURE SUMMARY: - Temporary central venous catheter removal - Tunneled central venous catheter insertion with fluoroscopic guidance - Additional procedure(s): None PROCEDURE DETAILS: Pre-procedure Consent: Informed consent for the procedure including risks, benefits and alternatives was obtained  and time-out was performed prior to the procedure. Preparation: The site was prepared and draped using maximal sterile barrier technique including cutaneous antisepsis. Anesthesia/sedation Level of anesthesia/sedation: Moderate sedation (conscious sedation) Anesthesia/sedation administered by: Independent trained observer under attending supervision with continuous monitoring of the patient's level of consciousness and physiologic status Total intra-service sedation time (minutes): I personally spent 38 minutes, continuously monitoring the patient face-to-face during the administration of moderate sedation. Radiology nurse was present for the duration of the procedure to assist in patient monitoring. Pre and Post Sedation activities have been reviewed. Venography Indication for venography: Not applicable Vein catheterized: Not applicable Findings: Not applicable Catheter exchange Local anesthesia was administered. An incision was made near the venous access site and the catheter was tunneled subcutaneously to the venous access site. A wire was passed through the indwelling central venous catheter and into the central veins. The catheter was removed, and a peel-away sheath was placed.  The catheter was advanced via a peel-away sheath into the vein under fluoroscopic guidance. Catheter tip location was fluoroscopically verified and a permanent image was stored. Catheter placed: HemoSplit Lot number: na Catheter size [place]): 16.5 [place] Lumens: Dual-lumen Power injectable: No Catheter tip: cavoatrial junction Catheter flush: Heparin (1000 units/mL) Closure The access site was closed and a sterile bandage was applied. Access site closure technique: Tissue adhesive Catheter securement technique: Non-absorbable suture Contrast Contrast agent: Omnipaque 300 Contrast volume (mL): 0 Radiation Dose Fluoroscopy time (minutes): 0.4 Reference Air Kerma (mGy): 2.63 Additional Details Additional description of procedure: None Registry event: V/3/g Device used: None Equipment details: None Unique Device Identifiers: Not available Specimens removed: None Estimated blood loss (mL): Less than 10 Standardized report: [email or web address]on_v3.1 Attestation Signer name: Rikki Spearing I attest that I was present for the entire procedure. I reviewed the stored images and agree with the report as written. _______________________________________________________________ Complications: No immediate complications.     Conversion of left-sided non-tunneled central venous catheter for a tunneled Dual-lumen Hemodialysis central venous catheter in the internal jugular vein, with tip in the expected location of the cavoatrial junction. Plan: The catheter may be used immediately.    CTA Upper Extremity W Wo Contrast  Result Date: 10/06/2023  EXAM: CTA Upper Extremity DATE: 10/05/2023 12:48 PM ACCESSION: [id number] UN DICTATED: 10/05/2023 8:56 PM INTERPRETATION LOCATION: MAIN CAMPUS CLINICAL INDICATION: 52 years old Female with Rule out pseudoaneurysm at the AVG arterial anastomosis  COMPARISON: CTA upper extremity 08/03/2020 TECHNIQUE: A spiral CTA scan was obtained with IV contrast of the right upper extremity from the clavicle to the wrist. Images were reconstructed in the axial plane. Multiplanar reformatted and MIP images were provided for further evaluation of the vessels. For selected cases, 3D volume rendered images are also provided. VASCULAR FINDINGS: RIGHT: SUBCLAVIAN: Patent. AXILLARY: Patent. Status post right brachioaxillary arteriovenous graft. 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  Notably, the outflow vein is dimminutive adjacent to the venous anastamosis BRACHIAL: Patent. DEEP BRACHIAL: Patent. RADIAL: Patent. ULNAR: Patent. INTEROSSEOUS: Patent. VEINS:  Normal caliber. NON-VASCULAR FINDINGS: LUNGS: The partially visualized right hemithorax is unremarkable. PLEURA: No pleural effusion, thickening, or pneumothorax. HEPATOBILIARY: The partially visualized liver demonstrates no focal hepatic lesions. GI TRACT: No dilated or thick walled loops of bowel partially visualized abdomen. PERITONEUM/RETROPERITONEUM AND MESENTERY: No free air or fluid. LYMPH NODES: No enlarged lymph nodes. BONES AND SOFT TISSUES: Surgical clips are noted within the distal upper arm.     Status post right brachioaxillary arteriovenous graft. 6 mm pseudoaneurysm at the venous  aspect of the graft.  Venous outflow is diminutive adjacent to the venous anastamosis     CT Abdomen Pelvis Wo Contrast  Result Date: 10/05/2023  EXAM: CT ABDOMEN PELVIS WO CONTRAST ACCESSION: [id number] UN REPORT DATE: 10/05/2023 12:51 PM CLINICAL INDICATION: 52 years old with worsening abdominal pain  COMPARISON: CT abdomen pelvis 10/02/2023 and multiple priors TECHNIQUE: A spiral CT scan was obtained without IV contrast from the lung bases to the pubic symphysis.  Images were reconstructed in the axial plane. Coronal and sagittal reformatted images were also provided for further evaluation. Evaluation of the solid organs and vasculature is limited in the absence of intravenous contrast. FINDINGS: LOWER CHEST: Small bilateral pleural effusions and dependent linear atelectasis. Small pericardial effusion, unchanged from prior. LIVER: Normal liver contour. No focal liver lesion on non-contrast examination. BILIARY: The gallbladder is surgically absent. No intrahepatic biliary ductal dilatation. SPLEEN: Normal in size and contour. PANCREAS: Normal pancreatic contour without signs of inflammation or gross ductal dilatation. ADRENAL GLANDS: Bilateral diffuse thickening of the adrenal glands. Subcentimeter left adrenal nodule measuring less than 10 HU (2:36) consistent with a macroscopic fat-containing adrenal adenoma and does not require additional follow-up. KIDNEYS/URETERS: Small atrophic kidneys. No nephrolithiasis.  No ureteral dilatation or collecting system distention. BLADDER: Diffuse bladder wall thickening, likely due to decompression. REPRODUCTIVE ORGANS: The uterus and ovaries are surgically absent. GI TRACT: No findings of bowel obstruction or acute inflammation.  Normal appendix.. Colonic diverticulosis without evidence of diverticulitis. Moderate rectal stool ball. PERITONEUM/RETROPERITONEUM AND MESENTERY: No free air. No ascites. No fluid collection. VASCULATURE: Normal caliber aorta. Minimal calcified atherosclerosis. Otherwise, limited evaluation without contrast. LYMPH NODES: -1.3 cm right external iliac chain lymph node (2:106), increased from 0.9 cm on prior examination. -1.1 cm left external iliac chain lymph node (2:104), increased from 0.8 cm on prior examination. BONES and SOFT TISSUES: No aggressive osseous lesions. Subcutaneous emphysema, fat stranding, and skin thickening in the left lower quadrant presumably from recent peritoneal dialysis catheter removal. Findings appear unchanged to mildly improved compared to prior. Small focus of hyperattenuation along the superficial rectus muscle appears slightly smaller compared to prior. Multilevel degenerative changes of the spine.     -- Evolving postsurgical changes of peritoneal dialysis catheter removal. -- Enlarging intra-abdominal lymph nodes, likely reactive in the setting of recent surgical intervention. -- Moderate rectal stool ball. -- Small bilateral pleural effusions.    IR Insert Non Tunneled Catheter (Age Greater Than 5 Years)  Result Date: 10/04/2023  PROCEDURE: Non-tunneled central venous catheter placement EXAM: IR INSERT NONTUNNELED CATH AGE GREATER THAN 5 YRS ACCESSION: [id number] UN REPORT DATE: 10/04/2023 7:00 AM Procedural Personnel Attending physician(s): None Resident physician(s): None Advanced practice provider(s): Cranford Mon, NP Pre-procedure diagnosis: ESRD on HD with AVG malfunction Post-procedure diagnosis: Same Indication: HD Additional clinical history: None _______________________________________________________________ PROCEDURE SUMMARY: - Venous access with ultrasound guidance - Non-tunneled central venous catheter insertion with fluoroscopic guidance - Additional procedure(s): None PROCEDURE DETAILS: Pre-procedure Consent: Informed consent for the procedure including risks, benefits and alternatives was obtained and time-out was performed prior to the procedure. Preparation: The site was prepared and draped using maximal sterile barrier technique including cutaneous antisepsis. Anesthesia/sedation Level of anesthesia/sedation: Moderate sedation (conscious sedation) Anesthesia/sedation administered by: Independent trained observer under attending supervision with continuous monitoring of the patient's level of consciousness and physiologic status Total intra-service sedation time (minutes): I personally spent 29 minutes, continuously monitoring the patient face-to-face during the administration of moderate sedation. Radiology nurse was present for the duration of the procedure to  assist in patient monitoring. Pre and Post Sedation activities have been reviewed. Access Local anesthesia was administered. The vessel was sonographically evaluated and determined to be patent. Real time ultrasound was used to visualize needle entry into the vessel and a permanent image was stored. Laterality: Left Vein accessed: Internal jugular vein Access technique: Micropuncture set with 21 gauge needle Venography Indication for venography: Not applicable Vein catheterized: Not applicable Findings: Not applicable Catheter placement The access site was dilated and the catheter was placed into the vein over a wire  under fluoroscopic guidance.  The catheter tip location was fluoroscopically verified and a permanent image was stored.. A sterile dressing was applied. Catheter placed: Trialysis Lot number: na Catheter size Janice Norrie): 12.5 Catheter length (cm): 20 Catheter flush: Heparin (1000 units/mL) for HD lumens and Heparin 100 units/ml for powerline. Catheter securement technique: Non-absorbable suture Contrast Contrast agent: Omnipaque 300 Contrast volume (mL): 0 Radiation Dose Fluoroscopy time (minutes): 1.4  Reference Air Kerma (mGy): 8.22 Additional Details Additional description of procedure: None Registry event: V/3/g Device used: None Equipment details: None Unique Device Identifiers: Not available Specimens removed: None Estimated blood loss (mL): Less than 10 Standardized report: [email or web address]_NonTunneledCatheter_v3.1 Attestation Signer name: Cranford Mon I attest that I was present for the entire procedure. I reviewed the stored images and agree with the report as written. _______________________________________________________________ Complications: No immediate complications.     Insertion of left-sided non-tunneled triple- lumen temporary dialysis catheter in the internal jugular vein, with tip in the expected location of the cavoatrial junction. Plan: The catheter may be used immediately.     XR Chest Portable  Result Date: 10/03/2023  EXAM: XR CHEST PORTABLE ACCESSION: [id number] UN REPORT DATE: 10/03/2023 4:58 PM CLINICAL INDICATION: POSTSURGICAL STATUS  TECHNIQUE: Single View AP Chest Radiograph. COMPARISON: Earlier same day Central line insertion images, chest radiographs 06/07/2023, 06/03/2023 FINDINGS: Left internal jugular central venous catheter tip overlying the right atrium. Lungs are clear.  No pleural effusion or pneumothorax. Normal heart size and mediastinal contours.     Anticipate imaging associated with the left IJ approach central venous catheter which terminates over the right atrium.     ECG 12 Lead  Result Date: 10/03/2023  NORMAL SINUS RHYTHM LOW VOLTAGE QRS BORDERLINE ECG WHEN COMPARED WITH ECG OF 22-Jun-2023 18:08, NO SIGNIFICANT CHANGE WAS FOUND    PVL Hemodialysis Graft/Fistula Duplex  Result Date: 10/03/2023    Peripheral Vascular Lab     [address]   [place], [place] [address]  PVL HEMODIALYSIS GRAFT-FISTULA DUPLEX Patient Demographics Pt. Name: ROMANITA FAGER Location: [facility] Inpatient MRN:      [id number]          Sex:      F DOB:      1971/09/20        Age:      59 years  Study Information Authorizing         [id number] [facility] Records [facility] Performed Time       10/03/2023 6:54:32 Provider Name                                                 AM Ordering Physician  Lyn Records [facility]        Patient Location     [facility] Accession Number    [id number] [facility]       Technologist  Marissa Pachlhofer                                                               RVT Diagnosis:                               Administrator Ordered Reason For Exam: concern for infected fistula  Reason For Exam = concern for infected AVG; pain along the AVG. Access site = Right Upper Extremity. Access Type = brachioaxillary arc AVG. Right brachioaxillary arc AVG creation 08/15/2023.  Final Interpretation Patent AVG _ Non-vascular structure adjacent to the arterial anastomosis (see below).  Electronically signed by [id number] Jodell Cipro MD on 10/03/2023 at 8:30:01 AM.  Examination Protocol: The dialysis access is interrogated using Doppler ultrasound. B-mode imaging and color Doppler are used to evaluate for diameters, depths, velocities, flow volumes, and abnormalities.When arterial steal is suspected by ordering physician, PW Doppler signals are evaluated at the wrist and PPG tracings are obtained from the digits. In the presence of retrograde flow at the wrist or nonpulsatile PPG tracings, dialysis access may be momentarily occluded to evaluate hemodynamic changes. In the presence of inadaquate fistula maturation, the afferant arteries and efferent veins are evaluated for obstruction. In the presence of suspected venous hypertension, the central veins are evaluated for stenosis.  Findings: +--------------------+--------+-------+--------+-----------+ -                    -Diameter-Depth  -PSV     -Flow Volume- +--------------------+--------+-------+--------+-----------+ -brachial artery     -5.0 mm  -28.4 mm-143 cm/s-634 ml/min - +--------------------+--------+-------+--------+-----------+ -arterial anastomosis-2.9 mm -20.4 mm-257 cm/s-           - +--------------------+--------+-------+--------+-----------+ -AVG @ DUA           -6.1 mm  -5.9 mm -187 cm/s-660 ml/min - +--------------------+--------+-------+--------+-----------+ -AVG @ MUA           -6.1 mm  -4.8 mm -76 cm/s -402 ml/min - +--------------------+--------+-------+--------+-----------+ -AVG @ PUA           -4.4 mm  -9.4 mm -131 cm/s-580 ml/min - +--------------------+--------+-------+--------+-----------+ -venous anastomosis  -2.9 mm  -28.4 mm-175 cm/s-           - +--------------------+--------+-------+--------+-----------+ -axillary vein       -5.9 mm  -34.6 mm-45 cm/s -           - +--------------------+--------+-------+--------+-----------+  The right brachioaxillary AVG appears patent throughout. There appears to be a non-vascularized mixed echogenic area adjacent to the arterial anastomosis (see images 28-32). See above for AVG measurements and flow volumes.   Final     CT Abdomen Pelvis Wo Contrast  Result Date: 10/02/2023  EXAM: CT ABDOMEN PELVIS WO CONTRAST ACCESSION: [id number] UN REPORT DATE: 10/02/2023 6:57 PM CLINICAL INDICATION: 51 years old with Recent removal of peritoneal dialysis cath with abdominal tenderness, warmth, and mild erythema.  COMPARISON: 02/27/2021. TECHNIQUE: A spiral CT scan was obtained without IV contrast from the lung bases to the pubic symphysis.  Images  were reconstructed in the axial plane. Coronal and sagittal reformatted images were also provided for further evaluation. Evaluation of the solid organs and vasculature is limited in the absence of intravenous contrast. FINDINGS: LOWER CHEST: Small pericardial effusion. LIVER: Unremarkable noncontrast appearance of the liver. BILIARY: Status post cholecystectomy. No intrahepatic biliary ductal dilatation. SPLEEN: Normal in size and contour. PANCREAS: Normal pancreatic contour without signs of inflammation or gross ductal dilatation. ADRENAL GLANDS: Thickening of the adrenal glands. Ido KIDNEYS/URETERS: Small atrophic kidneys. No nephrolithiasis.  No ureteral dilatation or collecting system distention. BLADDER: Unremarkable. REPRODUCTIVE ORGANS: Status post hysterectomy. GI TRACT: The stomach appears unremarkable. The loops of small bowel are normal in caliber. No evidence of acute colonic pathology. The appendix appears unremarkable. PERITONEUM/RETROPERITONEUM AND MESENTERY: Small mount of free air in the abdomen. No ascites. No fluid collection. VASCULATURE: Normal caliber aorta. Trace calcified atherosclerotic disease. LYMPH NODES: Prominent retroperitoneal lymph nodes. BONES and SOFT TISSUES: Degenerative changes of the spine. Small fat-containing umbilical hernia. Soft tissue gas and stranding in the left ventral body wall at the site of prior peritoneal dialysis catheter. There is a focus of high attenuation (series 2#85)     1. Small-moderate free air in the abdomen, presumably related to the recent removal of the peritoneal dialysis catheter. 2. Soft tissue stranding with locules of gas in the left ventral body wall at the site of prior dialysis catheter. Note is made of a focus of high attenuation along the superficial margin of the rectus muscle which could represent active extravasation. There is overlying skin thickening which can be seen with cellulitis. No discrete drainable fluid collection is identified. 3. Additional findings, as described in the body of the report. The findings were discussed with PA Baker by Dr. Lane Hacker at 19: 05 on 10/02/2023.    ______________________________________________________________________  Discharge Instructions:   Activity Instructions    You received sedation today.  Please follow these instructions:  Rest for the next 24 hours.  Do not drive a car, operate heavy machinery, drink alcohol or sign legal documents for 24 hours.  Avoid strenuous activity and heavy lifting (above 10 pounds) for 1 week.    Catheter insertion site should be covered with a sterile, occlusive dressing at all times.  Do not get dressing or catheter site wet.  Do not submerge line in water (no swimming pools, tub soaks, oceans, lakes, etc...)  Line should always have a clave on the end.   Line should be clamped when not in use.  Infusion nurse will perform care, flushing, dressing changes, and teaching.  When showering, cover the site with a waterproof dressing.    You may remove the small dressing on the side of your neck after 48 hours.    Keep covered with a bandaid until healed.     Please call [facility] VIR clinic at [phone] and choose option 3 to speak to a nurse for any problems or questions following discharge including:    Bleeding that saturates bandage  Pain at site not controlled by regular pain medicines  Signs of infection at site: redness, swelling, pus draining, fever above 101.5, shakes/chills  If site bleeds, sit upright and hold pressure to area.    Calls received during normal business hours (Monday through Friday from 8:00 am to 4:30 pm) will be returned within one business day. Calls received after normal business hours OR on weekends will be returned during the next business day.     If this is an after-hours urgent matter,  you can call the Hospital front desk at [phone] and ask to speak with the Interventional Radiology (VIR) doctor on call.    If you are experiencing a medical emergency, please call 911 or go to your nearest emergency room.                 Other Instructions       Discharge instructions      You were admitted to [facility] for concern for cellulitis (skin infection) of your right AV graft site. You were treated with IV antibiotics and able to transition to oral antibiotics on discharge. You had a temporary line placed by the Vascular Interventional Radiology team. Nephrology and General Surgery evaluated you during your admission. You were improved on discharge with plan to continue oral antibiotics and continue with dialysis by the temporary line.     Your follow-up appointment: (this is a longer hospital follow-up appointment, please arrive 15 minutes before the first appointment)  10/25/2023 8:40 AM TRANSITIONS ANCILLARY FM 20 min [facility] FAMILY MEDICINE [place] [facility] TRANSITION CLINIC [[id number]]   10/25/2023 9:20 AM TRANSITIONS PROVIDER FM 20 min [facility] MEDICINE [place] Bernette Redbird, MD [phone]       Please carefully read and follow these instructions below upon your discharge:    1) Please take your medications as prescribed and note the changes listed on your discharge. At future follow-up appointments, please be sure to take all of your medications with you so your provider can better guide your care.     2) Seek medical care with your primary care doctor or local Emergency Room or Urgent Care if you develop any changes in your mental status, worsening abdominal pain, fevers greater than 101.5, any unexplained/unrelieved shortness of breath, uncontrolled nausea and vomiting that keeps you from remaining hydrated or taking your medication, or any other concerning symptoms.     3) Please go to your follow-up appointments. Some of your follow-up appointments have been listed below. If you do not see an appointment listed below with your primary care doctor, please call your doctor's office as soon as possible to schedule an appointment to be seen within 7-10 days of discharge.     4) If you have any concerns before you are able to follow-up with your primary care doctor, you can reach [place] by calling [phone].                 Follow Up instructions and Outpatient Referrals     Discharge instructions          Appointments which have been scheduled for you      Oct 25, 2023 8:40 AM  (Arrive by 8:30 AM)  Coy Saunas FM with [facility] TRANSITION CLINIC  [facility] FAMILY MEDICINE [place] [facility] REGION) [address]  [place] [address]  [phone]        Oct 25, 2023 9:20 AM  (Arrive by 9:10 AM)  TRANSITIONS PROVIDER FM with Bernette Redbird, MD  [facility] FAMILY MEDICINE [place] [facility]) [address]  [place] [address]  [phone]        Oct 29, 2023 11:30 AM  (Arrive by 11:10 AM)  RETURN  GENERAL with Dyann Ruddle, MD  [facility] GENERAL AND BARIATRIC SURGERY [facility]) [address]  [address]  [place] [address]  [phone]        Nov 20, 2023 11:00 AM  (Arrive by 10:30 AM)  VASCULAR ULTRASOUND ARTERIAL DUPLEX UPPER EXTREMITY LEFT with [facility] OUTPATIENT 1  IMG PVL A [place] [facility]) [address]  [place] [address]  [phone]        Nov 20, 2023 12:15 PM  (Arrive by 11:45 AM)  RETURN  GENERAL with Earney Mallet, MD  [facility] VASCULAR SURGERY [place] [facility] REG[facility]) [address]  [place] [address]  [phone]             ______________________________________________________________________  Discharge Day Services:  BP 114/55  - Pulse 98  - Temp 36.9 ??C (98.5 ??F) (Oral)  - Resp 18  - Ht 157.5 cm (5' 2)  - Wt 82.4 kg (181 lb 9.6 oz)  - LMP  (LMP Unknown)  - SpO2 100%  - BMI 33.22 kg/m??   Pt seen on the day of discharge and determined appropriate for discharge.    GEN: well appearing, lying in bed, NAD   HEENT: NCAT, MMM. EOMI.  Neck: Supple.  CV: Regular rate and rhythm. No murmurs/rubs/gallops.  Pulm: CTAB. No wheezing, crackles, or rhonchi.  Abd: Flat.  Nontender. No guarding, rebound.  Normoactive bowel sounds.    Neuro: A&O x 3. No focal deficits.   Ext: No peripheral edema.  Palpable distal pulses.  Skin: Generalized redness and peeling much improved from prior. New tunneled line site in left chest without drainage, appropriate tenderness.     Condition at Discharge: good    Length of Discharge: I spent greater than 30 mins in the discharge of this patient.    Hewitt Shorts, MD

## 2023-10-08 NOTE — Unmapped (Signed)
Vancomycin random level 22.7 mcg/ml.   Will recheck random level tomorrow and start linezolid once vancomycin level < 15 mcg/ml.

## 2023-10-08 NOTE — Unmapped (Signed)
Nephrology ESKD Follow-up Consult Note    Assessment/Recommendations: Megan Robertson is a 52 y.o. 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     # ESKD:  - No indications for urgent/emergent HD today based upon volume status, electrolytes or acidemia. We will continue hemodialysis thrice weekly. Please see orders for most up to date prescription.  - holding dialysis today as tunneled line placement was planned - no acute indication today; will plan for HD after line placement tomorrow.  - Volume: Will attempt to achieve dry weight if tolerated  - Medication list currently appropriate  -  plan for tunneled catheter placement until sure no cellulitis over AVG / cellulitis has resolved.  Current dialysis Rx:   EDW:81 kg (178 lb 9.2 oz)     Dialyzer:F-180 (98 mLs)   Bath:3 K+ / 2.5 Ca+  ;  Na:137 mEq/L  ;  CO2:    Recent labs:  Na/K/Cl/CO2:143/3.8/104/23.6 (12/16 0606)    # Anemia of Chronic Kidney Disease:   - Will follow for ESA need.  Lab Results   Component Value Date    HGB 9.9 (L) 10/07/2023    HGB 9.6 (L) 10/06/2023    HGB 9.9 (L) 10/05/2023     Lab Results   Component Value Date    IRON 67 06/23/2023    TIBC 169 (L) 06/23/2023    FERRITIN 425.7 (H) 06/22/2023     # Cellulitis overlying AVG    - likely plan to transition to linezolid.  - Reviewed primary team's daily progress note, no concerning symptoms today.  - Evaluation and management per primary team  - No changes to management from a nephrology standpoint at this time    Discussed recommendations with primary team.  Thanks for this consult, we will continue to follow along and provide recommendations as needed.    *Please contact Nephrology PRIOR to hospital discharge so we can assist the primary team and ensure appropriate dialysis related follow-up (ie scheduling, antibiotics, changes in EDW and dialysis access, etc.).Maryruth Hancock, MD  10/07/2023 11:03 PM Medical decision-making for 10/07/23  Findings / Data     Patient has: []  acute illness w/systemic sxs  [mod]  []  two or more stable chronic illnesses [mod]  []  one chronic illness with acute exacerbation [mod]  []  acute complicated illness  [mod]  []  Undiagnosed new problem with uncertain prognosis  [mod] [x]  illness posing risk to life or bodily function (ex. AKI)  [high]  []  chronic illness with severe exacerbation/progression  [high]  []  chronic illness with severe side effects of treatment  [high] ESKD on RRT Probs At least 2:  Probs, Data, Risk   I reviewed: [x]  primary team note  []  consultant note(s)  []  external records [x]  chemistry results  [x]  CBC results  []  blood gas results  []  Other []  procedure/op note(s)   []  radiology report(s)  []  micro result(s)  []  w/ independent historian(s) Hb addressed above, No hyperK req RRT, per primary  >=3 Data Review (2 of 3)    I independently interpreted: []  Urine Sediment  []  Renal US []  CXR Images  []  CT Images  []  Other []  EKG Tracing  Any     I discussed: []  Pathology results w/ QHPs(s) from other specialties  []  Procedural findings w/ QHPs(s) from other specialties []  Imaging w/ QHP(s) from other specialties  [x]  Treatment plan w/ QHP(s) from  other specialties Plan discussed with primary team Any     Mgm't requires: []  Prescription drug(s)  [mod]  []  Kidney biopsy  [mod]  []  Central line placement  [mod] []  High risk medication use and/or intensive toxicity monitoring [high]  [x]  Renal replacement therapy [high]  []  High risk kidney biopsy  [high]  []  Escalation of care  [high]  []  High risk central line placement  [high] RRT: High risk of complications from RRT requiring intensive monitoring Risk      _____________________________________________________________________________________    Subjective/Interval Events:  No complaints today; tunneled HD catheter placement delayed. Feels well. No pain over graft at present.     Physical Exam:  Vitals:    10/07/23 2045 10/07/23 2200 10/07/23 2223 10/07/23 2242   BP: 93/59 81/54 114/52 115/51   Pulse: 103 102 106 105   Resp: 18      Temp: 37.4 ??C (99.3 ??F)      TempSrc: Oral      SpO2: 100%      Weight:       Height:         No intake/output data recorded.  No intake or output data in the 24 hours ending 10/07/23 2303  General: well-appearing, no acute distress  CV: RRR  Lungs: normal work of breathing  Ext: no edema    Recent Labs     Units 10/02/23  1559 10/03/23  0621 10/05/23  0607 10/06/23  0607 10/07/23  0606   NA mmol/L 141   < > 140 144 143   K mmol/L 4.4   < > 3.8 3.9 3.8   CL mmol/L 103   < > 102 102 104   CO2 mmol/L 23.6   < > 26.0 26.6 23.6   BUN mg/dL 35*   < > 13 21 24*   CREATININE mg/dL 1.61*   < > 0.96* 0.45* 9.32*   GLU mg/dL 90   < > 75 77 83   CALCIUM mg/dL 8.8   < > 7.6* 7.7* 7.8*   ALBUMIN g/dL 3.2*  --   --   --   --    PHOS mg/dL  --    < > 3.5 3.5 4.1    < > = values in this interval not displayed.

## 2023-10-09 LAB — BASIC METABOLIC PANEL
ANION GAP: 17 mmol/L — ABNORMAL HIGH (ref 5–14)
BLOOD UREA NITROGEN: 16 mg/dL (ref 9–23)
BUN / CREAT RATIO: 3
CALCIUM: 7.6 mg/dL — ABNORMAL LOW (ref 8.7–10.4)
CHLORIDE: 101 mmol/L (ref 98–107)
CO2: 21.1 mmol/L (ref 20.0–31.0)
CREATININE: 5.51 mg/dL — ABNORMAL HIGH (ref 0.55–1.02)
EGFR CKD-EPI (2021) FEMALE: 9 mL/min/{1.73_m2} — ABNORMAL LOW (ref >=60)
GLUCOSE RANDOM: 64 mg/dL — ABNORMAL LOW (ref 70–179)
POTASSIUM: 4.4 mmol/L (ref 3.4–4.8)
SODIUM: 139 mmol/L (ref 135–145)

## 2023-10-09 LAB — CBC
HEMATOCRIT: 31.8 % — ABNORMAL LOW (ref 34.0–44.0)
HEMOGLOBIN: 9.7 g/dL — ABNORMAL LOW (ref 11.3–14.9)
MEAN CORPUSCULAR HEMOGLOBIN CONC: 30.6 g/dL — ABNORMAL LOW (ref 32.0–36.0)
MEAN CORPUSCULAR HEMOGLOBIN: 24.4 pg — ABNORMAL LOW (ref 25.9–32.4)
MEAN CORPUSCULAR VOLUME: 79.7 fL (ref 77.6–95.7)
MEAN PLATELET VOLUME: 6.8 fL (ref 6.8–10.7)
PLATELET COUNT: 228 10*9/L (ref 150–450)
RED BLOOD CELL COUNT: 3.99 10*12/L (ref 3.95–5.13)
RED CELL DISTRIBUTION WIDTH: 19.9 % — ABNORMAL HIGH (ref 12.2–15.2)
WBC ADJUSTED: 12.3 10*9/L — ABNORMAL HIGH (ref 3.6–11.2)

## 2023-10-09 LAB — PHOSPHORUS: PHOSPHORUS: 4 mg/dL (ref 2.4–5.1)

## 2023-10-09 LAB — MAGNESIUM: MAGNESIUM: 1.7 mg/dL (ref 1.6–2.6)

## 2023-10-09 MED ADMIN — calcitriol (ROCALTROL) capsule 0.5 mcg: .5 ug | ORAL | @ 14:00:00 | Stop: 2023-10-09

## 2023-10-09 MED ADMIN — cinacalcet (SENSIPAR) tablet 60 mg: 60 mg | ORAL | @ 14:00:00 | Stop: 2023-10-09

## 2023-10-09 MED ADMIN — apixaban (ELIQUIS) tablet 2.5 mg: 2.5 mg | ORAL | @ 02:00:00

## 2023-10-09 MED ADMIN — apixaban (ELIQUIS) tablet 2.5 mg: 2.5 mg | ORAL | @ 14:00:00 | Stop: 2023-10-09

## 2023-10-09 MED ADMIN — sevelamer (RENVELA) tablet 2,400 mg: 2400 mg | ORAL | @ 17:00:00 | Stop: 2023-10-09

## 2023-10-09 MED ADMIN — aspirin chewable tablet 81 mg: 81 mg | ORAL | @ 14:00:00 | Stop: 2023-10-09

## 2023-10-09 MED ADMIN — fluticasone furoate (ARNUITY ELLIPTA) 100 mcg/actuation inhaler 1 puff: 1 | RESPIRATORY_TRACT | @ 14:00:00 | Stop: 2023-10-09

## 2023-10-09 MED ADMIN — mirtazapine (REMERON) tablet 15 mg: 15 mg | ORAL | @ 02:00:00

## 2023-10-09 MED ADMIN — sevelamer (RENVELA) tablet 2,400 mg: 2400 mg | ORAL | @ 14:00:00 | Stop: 2023-10-09

## 2023-10-09 NOTE — Unmapped (Signed)
4hrs of HD.

## 2023-10-09 NOTE — Unmapped (Signed)
Nephrology ESKD Follow-up Consult Note    Assessment/Recommendations: Megan Robertson is a 52 y.o. 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     # ESKD:  - No indications for urgent/emergent HD today based upon volume status, electrolytes or acidemia. We will continue hemodialysis thrice weekly. Please see orders for most up to date prescription.  - no acute indication for dialysis, received dialysis yesterday.  Ideally would like to complete another treatment today to ensure she receives 3 treatments this week but the patient is reluctant to have this another session today. would plan for next treatment Friday as outpatient- she hopes to discharge home and return to home unit on Friday  - Volume: Will attempt to achieve dry weight if tolerated  - Medication list currently appropriate  -  tunneled catheter in place - oozing overnight; looks ok now.  Current dialysis Rx:   EDW:81 kg (178 lb 9.2 oz)     Dialyzer:F-180 (98 mLs)   Bath:3 K+ / 2.5 Ca+  ;  Na:137 mEq/L  ;  CO2:35 mEq/L   Recent labs:  Na/K/Cl/CO2:139/4.4/101/21.1 (12/18 0549)    # Anemia of Chronic Kidney Disease:   - Will follow for ESA need.  Lab Results   Component Value Date    HGB 9.7 (L) 10/09/2023    HGB 9.5 (L) 10/08/2023    HGB 9.9 (L) 10/07/2023     Lab Results   Component Value Date    IRON 67 06/23/2023    TIBC 169 (L) 06/23/2023    FERRITIN 425.7 (H) 06/22/2023     # Cellulitis overlying AVG    - plan to transition to linezolid orally today to complete course - would need outpt eval to see if ok to use in future  - Reviewed primary team's daily progress note, no concerning symptoms today.  - Evaluation and management per primary team  - No changes to management from a nephrology standpoint at this time    Discussed recommendations with primary team.  Thanks for this consult, we will continue to follow along and provide recommendations as needed.    *Please contact Nephrology PRIOR to hospital discharge so we can assist the primary team and ensure appropriate dialysis related follow-up (ie scheduling, antibiotics, changes in EDW and dialysis access, etc.).Maryruth Hancock, MD  10/09/2023 12:44 PM   Medical decision-making for 10/09/23  Findings / Data     Patient has: []  acute illness w/systemic sxs  [mod]  []  two or more stable chronic illnesses [mod]  []  one chronic illness with acute exacerbation [mod]  []  acute complicated illness  [mod]  []  Undiagnosed new problem with uncertain prognosis  [mod] [x]  illness posing risk to life or bodily function (ex. AKI)  [high]  []  chronic illness with severe exacerbation/progression  [high]  []  chronic illness with severe side effects of treatment  [high] ESKD on RRT Probs At least 2:  Probs, Data, Risk   I reviewed: [x]  primary team note  []  consultant note(s)  []  external records [x]  chemistry results  [x]  CBC results  []  blood gas results  []  Other []  procedure/op note(s)   []  radiology report(s)  []  micro result(s)  []  w/ independent historian(s) Hb addressed above, No hyperK req RRT, per primary  >=3 Data Review (2 of 3)    I independently interpreted: []  Urine Sediment  []  Renal US []  CXR Images  []   CT Images  []  Other []  EKG Tracing  Any     I discussed: []  Pathology results w/ QHPs(s) from other specialties  []  Procedural findings w/ QHPs(s) from other specialties []  Imaging w/ QHP(s) from other specialties  [x]  Treatment plan w/ QHP(s) from other specialties Plan discussed with primary team Any     Mgm't requires: []  Prescription drug(s)  [mod]  []  Kidney biopsy  [mod]  []  Central line placement  [mod] []  High risk medication use and/or intensive toxicity monitoring [high]  [x]  Renal replacement therapy [high]  []  High risk kidney biopsy  [high]  []  Escalation of care  [high]  []  High risk central line placement  [high] RRT: High risk of complications from RRT requiring intensive monitoring Risk _____________________________________________________________________________________    Subjective/Interval Events:  No major complaints today.  She did have substantial oozing from her dialysis catheter overnight.  Dressing was changed again this morning.  No present bleeding.  Some soreness at the catheter site but no other complaints.  Reports that she is planned for discharge today and would hope to continue her dialysis session on Friday rather than dialyzing today.    Physical Exam:  Vitals:    10/08/23 1700 10/08/23 1714 10/08/23 2030 10/09/23 0715   BP: 95/51 103/63 116/65 114/55   Pulse: 86 91 105 98   Resp:  16 18 18    Temp:  36.7 ??C (98 ??F) 36.9 ??C (98.4 ??F) 36.9 ??C (98.5 ??F)   TempSrc:  Oral Oral Oral   SpO2:   96% 100%   Weight:       Height:         No intake/output data recorded.    Intake/Output Summary (Last 24 hours) at 10/09/2023 1244  Last data filed at 10/08/2023 1706  Gross per 24 hour   Intake --   Output 1000 ml   Net -1000 ml     General: well-appearing, no acute distress  CV: RRR  Lungs: normal work of breathing  Ext: no edema    Recent Labs     Units 10/02/23  1559 10/03/23  0621 10/07/23  0606 10/08/23  0401 10/09/23  0549   NA mmol/L 141   < > 143 143 139   K mmol/L 4.4   < > 3.8 3.9 4.4   CL mmol/L 103   < > 104 105 101   CO2 mmol/L 23.6   < > 23.6 24.6 21.1   BUN mg/dL 35*   < > 24* 26* 16   CREATININE mg/dL 1.91*   < > 4.78* 29.56* 5.51*   GLU mg/dL 90   < > 83 86 64*   CALCIUM mg/dL 8.8   < > 7.8* 7.9* 7.6*   ALBUMIN g/dL 3.2*  --   --   --   --    PHOS mg/dL  --    < > 4.1 5.4* 4.0    < > = values in this interval not displayed.

## 2023-10-09 NOTE — Unmapped (Addendum)
Pt stable for discharge. Had dialysis today with new line that was placed this AM in VIR. VSS. Pt education complete and pt agreeable to plan.    Pt ended up staying overnight cause ride couldn't come till late and pt HD dressing was oozing. Pt agreeable to discharge now this PM on 12/18.  Problem: Adult Inpatient Plan of Care  Goal: Plan of Care Review  Outcome: Resolved  Goal: Patient-Specific Goal (Individualized)  Outcome: Resolved  Goal: Absence of Hospital-Acquired Illness or Injury  Outcome: Resolved  Intervention: Identify and Manage Fall Risk  Recent Flowsheet Documentation  Taken 10/08/2023 1000 by Vincent Gros, RN  Safety Interventions: fall reduction program maintained  Taken 10/08/2023 0800 by Vincent Gros, RN  Safety Interventions:   fall reduction program maintained   low bed  Goal: Optimal Comfort and Wellbeing  Outcome: Resolved  Goal: Readiness for Transition of Care  Outcome: Resolved  Goal: Rounds/Family Conference  Outcome: Resolved     Problem: Wound  Goal: Optimal Coping  Outcome: Resolved  Goal: Optimal Functional Ability  Outcome: Resolved  Intervention: Optimize Functional Ability  Recent Flowsheet Documentation  Taken 10/08/2023 0800 by Vincent Gros, RN  Activity Management: up ad lib  Goal: Absence of Infection Signs and Symptoms  Outcome: Resolved  Goal: Improved Oral Intake  Outcome: Resolved  Goal: Optimal Pain Control and Function  Outcome: Resolved  Goal: Skin Health and Integrity  Outcome: Resolved  Intervention: Optimize Skin Protection  Recent Flowsheet Documentation  Taken 10/08/2023 0800 by Vincent Gros, RN  Activity Management: up ad lib  Goal: Optimal Wound Healing  Outcome: Resolved     Problem: Hemodialysis  Goal: Safe, Effective Therapy Delivery  Outcome: Resolved  Goal: Effective Tissue Perfusion  Outcome: Resolved  Goal: Absence of Infection Signs and Symptoms  Outcome: Resolved

## 2023-10-09 NOTE — Unmapped (Signed)
Teaching Surgeon Attestation:  I, Dyann Ruddle, M.D., saw and evaluated the patient. I have reviewed and edited the above note, and I agree with the findings and the plan of care as documented in the Resident's note.Daily Progress Note    Assessment/Plan:  52 year old female with past medical history of ESRD (status post right brachioaxillary arc arteriovenous graft on 08/15/2023 with Dr. Darlin Drop and status post PD catheter removal by IR on 10/01/2023), anxiety, and chronic pain. She was admitted with concerns of cellulitis at AVG site in RUE and abdominal infection.   She has been undergoing dialysis via left internal jugular catheter. 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    -Continue PO abx  -Dressing change at catheter site  -Ok to DC from surgery perspective after dialysis         LOS: 5 days     Subjective:    NAEO. Patient states she is doing better. Her left tunnel line placement site is still a bit tender. Denies nausea/vomiting. States she is having improved abdominal pain. Denies fever/chills. States tenderness in R arm is slightly improved.     Objective:    Vital signs in last 24 hours:  Temp:  [36.7 ??C (98 ??F)-36.9 ??C (98.4 ??F)] 36.9 ??C (98.4 ??F)  Heart Rate:  [77-105] 105  Resp:  [16-26] 18  BP: (95-140)/(51-96) 116/65  MAP (mmHg):  [74-87] 76  SpO2:  [92 %-100 %] 96 %    Intake/Output last 3 shifts:  I/O last 3 completed shifts:  In: 60 [P.O.:60]  Out: 1000 [Other:1000]  Intake/Output this shift:  No intake/output data recorded.    Physical Exam:  Physical Exam:  General Appearance:    Alert, cooperative, no distress, appears stated age   Head:    Normocephalic, without obvious abnormality, atraumatic   Eyes:    Sclera non-icteric, EOM's intact   Ears:    Normal external ear canals, both ears   Nose:   Nares normal   Throat:   Lips normal   Lungs:     Respirations unlabored, on room air    Heart:    Regular rate and rhythm    Abdomen:     Soft, tender in left lower quadrant, focally at incisions. Incisions are well healed/dry.   Extremities/Skin:   LUE catheter site with oozing under dressing and tender to palpation. RUE erythema improved, right AV graft with palpable thrill and bruit heard, palpable radial pulse.   Neurologic:   CNII-XII grossly intact       Amaya Esterellas PA-C  10/09/2023  6:52 AM

## 2023-10-09 NOTE — Unmapped (Signed)
Family Medicine Inpatient Service  Progress Note    Team: Family Medicine [facility] (pgr [phone])    Hospital Day: 5    ASSESSMENT / PLAN:   Megan Robertson is a 52 y.o. female with a past medical history significant for ESRD, anxiety, and chronic pain, who presented to [facility] ED on 12/11 on the advice of her nephrologist with concern for cellulitis of right arm over her fistula site, also reports abdominal pain from recent peritoneal dialysis catheter removal site (removed 12/10). Continued LLQ and RUE pain on palpation.    # Cellulitis  # Abdominal Pain, improving  Presented with erythema, edema, and pain over right AV fistula site and abdominal pain and warmth near PD removal site, Nephrologist concerned for AVG site cellulitis. Right AVG site placed by General Surgery 08/15/2023 due to poor response with peritoneal dialysis. PD catheter and right internal jugular catheter were both removed on 12/10 without complication, given injection of vancomycin that day. States pain at her right AVG site is at baseline. S/p CTX, vancomycin, and zosyn in ED for concern for peritoneal infection. S/p temporary LIJ non-tunneled trialysis with VIR on 12/12 PM. VS have remained stable, afebrile, intermittently tachycardic. Initial exam notable for erythema to skin diffusely (arms, abdomen, chest) and itching, which has improved on exam 12/15 now with mild skin peeling. Continues to have abdominal tenderness in LLQ, but slowly improving. Prior PD site and RIJ catheter sites well healing, and new LIJ site c/d/I. Repeat CTAP 12/14 with evolving post-surgical changes, enlarging abdominal lymph nodes likely reactive, and moderate rectal stool ball.   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  Venous outflow is diminutive adjacent to the venous anastamosis   - Routine monitoring of pseudoaneurysm with PVLs   - VIR consulted, recommendations appreciated   - Tunneled line placement today  - Transition from IV ceftriaxone 1 g daily to Linezolid 600 mg q12h to complete 14 day course   - Vancomycin level 23.5 12/16 iso received total of 3g 12/10-11  - Start linezolid after dialysis today  - Blood culture (12/11) NGTD at 5 days  - Daily CBC    # ESRD on hemodialysis   # Hyperphosphatemia - Secondary Hyperparathyroidism  HD on MWF. Dialyzed Monday 12/9, did not receive dialysis Wednesday 12/11 due to concern for cellulitis of right AVG as above. Received hemodialysis Friday 12/13 through temporary LIJ CVC. Plan to received another session prior to discharge with new line  - Nephrology consulted, recommendations appreciated  - VIR consulted, recommendations appreciated   - Tunnel line placement today   - tPA if LIJ CVC sluggish  - Daily BMP, Phos, Mg  - Continue sevelamer 2400 mg PO TID with meals  - Home ferric citrate not on formulary, will work towards getting approval if staying beyond 12/16  - Continue calcitriol 0.5 mcg daily and cinacalcet 60 mg daily  - Strict I&Os    # Hx of Normocytic Anemia   Hgb 11.6 on admission; down to 9.9 today (12/16). Receives Epo with dialysis   - Daily CBC    # Itching, improved  # Eczema  Patient reported body itching on night of admission, improved with hydroxyzine. Reports itching as a reaction to antibiotics in the past. Do wonder if erythema and itching could have partly been a reaction to vancomycin as improving  and vancomycin level is decreasing.   - Hydroxyzine 25 mg PO q6h PRN  - Continue lotion and petroleum jelly for dry, peeling skin  - Nystatin topical BID under breasts     # Hx of clotted AVF   06/2023 had an AVF of the left brachiocephalic vein. PVL 12/12 suggests the right brachioaxillary AVG is patent throughout, demonstrates a non-vascularized mixed echogenic area adjacent to the arterial anastomosis.   - Continue home Eliquis 2.5 mg   [ ]  OP consideration of whether still needs both ASA and eliquis      Chronic and Stable Conditions:  # Asthma: Continue home albuterol prn, Anuity Ellipta 100 mcg daily, CPAP nightly   # Anxiety: Continue home Mirtazapine 15 mg, PO, nightly      # Checklist  - IVF: None  - Daily labs needed: CBC, BMP, Magnesium, and Phosphorus  - Currently Regular diet  - Bowel Regimen: Senna 2 tabs qHS, miralax BID  - DVT: Continue home anticoagulation with Eliquis  - Code Status:   Orders Placed This Encounter   Procedures    Full Code     Standing Status:   Standing     Number of Occurrences:   1     - Dispo: Floor    [ ]  Anticipated Discharge Location: Home  [ ]  PT/OT/DME: No needs anticipated  [ ]  CM/SW needs:  Continued assistance for Medicaid/Medicare needs  [ ]  Follow up appt:  Needs PCP, has General Surgery 12/17    SUBJECTIVE:  Interval events: NAEON. Continues to do well and improved overall from presentation. Still has baseline RUE pain, but abdominal pain is continually improving. Skin is still dry and flaking, but improved with petroleum jelly.    REVIEW OF SYSTEMS:  Pertinent positives and negatives per HPI. A complete review of systems otherwise negative.    PHYSICAL EXAM:      Intake/Output Summary (Last 24 hours) at 10/09/2023 1454  Last data filed at 10/08/2023 1706  Gross per 24 hour   Intake --   Output 1000 ml   Net -1000 ml           Recent Vitals:  Vitals:    10/09/23 0715   BP: 114/55   Pulse: 98   Resp: 18   Temp: 36.9 ??C (98.5 ??F)   SpO2: 100%       GEN: Well appearing, inclined in bed, NAD  HEENT: NCAT, No scleral icterus. Conjunctiva non-erythematous. MMM.  CV: Tachycardic with regular rhythm. No murmurs/rubs/gallops. Prior tunneled line bandage c/d/I, LIJ trialysis line c/d/I without erythema, edema, or purulence.   Pulm: Normal work of breathing on room air. CTAB. No wheezing, crackles, or rhonchi.  Abd: Flat. Tender to palpation in LLQ.  No rebound tenderness or peritoneal signs. Normoactive bowel sounds.  Neuro: A&O x 3. No focal deficits.  Ext: No peripheral edema. Palpable distal pulses. Tenderness to palpation of RUE near AVG site.  Skin: Continued improved erythema to bilateral upper arms, chest, abdomen, thighs; continued skin peeling      LABS/ STUDIES:    All imaging, laboratory studies, and other pertinent tests including electrocardiography within the last 24 hours were reviewed and are summarized within the assessment and plan.     NUTRITION:                       [place], MS3    I attest that I have reviewed the student note and that the components  of the history of the present illness, the physical exam, and the assessment and plan documented were performed by me or were performed in my presence by the student where I verified the documentation and performed (or re-performed) the exam and medical decision making.     Cherie Dark, MD  Resident Physician Family Medicine, PGY-1

## 2023-10-09 NOTE — Unmapped (Signed)
HEMODIALYSIS NURSE PROCEDURE NOTE       Treatment Number:  2 Room / Station:  1    Procedure Date:  10/08/23 Device Name/Number: elsa    Total Dialysis Treatment Time:  245 Min.    CONSENT:    Written consent was obtained prior to the procedure and is detailed in the medical record.  Prior to the start of the procedure, a time out was taken and the identity of the patient was confirmed via name, medical record number and date of birth.     WEIGHT:  Hemodialysis Pre-Treatment Weights        Date/Time Pre-Treatment Weight (kg) Estimated Dry Weight (kg) Patient Goal Weight (kg) Total Goal Weight (kg)    10/08/23 1235 --  UTW post procedure  81 kg (178 lb 9.2 oz)  --  --                    Hemodialysis Post Treatment Weights        Date/Time Post-Treatment Weight (kg) Treatment Weight Change (kg)    10/08/23 1706 --  unable to weigh  --                   Active Dialysis Orders (168h ago, onward)       Start     Ordered    10/07/23 2310  Hemodialysis inpatient  Every Tue,Thu,Sat      Question Answer Comment   Patient HD Status: Chronic    New Start? No    K+ 3 meq/L    Ca++ 2.5 meq/L    Bicarb 35 meq/L    Na+ 137 meq/L    Na+ Modeling No    Dialyzer [id number]    Dialysate Temperature (C) 36.5    BFR-As tolerated to a maximum of: 400 mL/min    DFR 800 mL/min    Duration of treatment 4 Hr    Dry weight (kg) 81kg    Challenge dry weight (kg) no    Fluid removal (L) to EDW    Tubing Adult = 142 ml    Access Site Dialysis Catheter    Access Site Location Left    Keep SBP >:        10/07/23 2309    10/03/23 1353  Dialysis Schedule Order  (Dialysis ONCE)  Once         10/03/23 1356                  ASSESSMENT:  General appearance: alert  Neurologic: drowsy post procedure   Lungs: WNL  Heart: WNL  Abdomen: WNL    ACCESS SITE:       Hemodialysis Catheter 10/08/23 Venovenous catheter Left Internal jugular 2 mL 2.1 mL (Active)   Site Assessment Intact;Tender;Bleeding 10/08/23 1715   Proximal Lumen Status / Patency Gentamicin Citrate Locked 10/08/23 1715   Proximal Lumen Intervention Deaccessed 10/08/23 1715   Medial Lumen Status / Patency Gentamicin Citrate Locked 10/08/23 1715   Medial Lumen Intervention Deaccessed 10/08/23 1715   Dressing Type CHG gel;Occlusive;Transparent 10/08/23 1715   Dressing Drainage Description Sanguineous 10/08/23 1715   Dressing Change Due 10/10/23 10/08/23 1715   Line Necessity Reviewed? Y 10/08/23 1715   Line Necessity Indications Yes - Hemodialysis 10/08/23 1715   Line Necessity Reviewed With nephro 10/08/23 1715        Arteriovenous Fistula - Vein Graft  Access 08/15/23 1500 (Active)     Catheter fill volumes:    Arterial:  2.0 mL Venous: 2.1 mL   Catheter filled with gen citrate post procedure.     Patient Lines/Drains/Airways Status       Active Peripheral & Central Intravenous Access       None                   LAB RESULTS:  Lab Results   Component Value Date    NA 143 10/08/2023    K 3.9 10/08/2023    CL 105 10/08/2023    CO2 24.6 10/08/2023    BUN 26 (H) 10/08/2023    CREATININE 10.04 (H) 10/08/2023    GLU 86 10/08/2023    CALCIUM 7.9 (L) 10/08/2023    CAION 3.91 (L) 06/23/2023    PHOS 5.4 (H) 10/08/2023    MG 1.8 10/08/2023    PTH 261.5 (H) 10/24/2020    IRON 67 06/23/2023    LABIRON 40 06/23/2023    TRANSFERRIN 125.0 (L) 09/25/2021    FERRITIN 425.7 (H) 06/22/2023    TIBC 169 (L) 06/23/2023     Lab Results   Component Value Date    WBC 9.0 10/08/2023    HGB 9.5 (L) 10/08/2023    HCT 30.4 (L) 10/08/2023    PLT 262 10/08/2023    PHART 7.44 08/05/2020    PO2ART 142.0 (H) 08/05/2020    PCO2ART 36.9 08/05/2020    HCO3ART 25 08/05/2020    BEART 0.9 08/05/2020    O2SATART 99.4 08/05/2020    APTT 30.8 03/23/2022        VITAL SIGNS:   Temperature        Date/Time Temp Temp src       10/08/23 1714 36.7 ??C (98 ??F)  Oral                    Hemodynamics        Date/Time Pulse BP MAP (mmHg) Patient Position    10/08/23 1714 91  103/63  74  Lying     10/08/23 1700 86  95/51  --  Lying     10/08/23 1630 81  113/67 --  Lying     10/08/23 1600 80  106/52  --  Lying     10/08/23 1530 77  102/64  --  Lying     10/08/23 1500 82  120/74  --  Lying     10/08/23 1430 82  118/77  --  Lying     10/08/23 1400 79  119/78  --  Lying     10/08/23 1330 88  125/79  --  Lying                   Blood Volume Monitor        Date/Time Blood Volume Change (%) HCT HGB Critline O2 SAT %    10/08/23 1700 -10.1 %  33.8  11.5  64.8     10/08/23 1630 -11.5 %  34.3  11.7  70.3     10/08/23 1600 -11 %  34.1  11.6  66.9     10/08/23 1530 -10.1 %  33.8  11.5  76.8     10/08/23 1520 -11.8 %  34.5  11.7  71.7     10/08/23 1500 -15.2 %  35.8  12.2  79.1     10/08/23 1430 -12.9 %  34.9  11.9  80.6     10/08/23 1400 -10.4 %  33.9  11.5  78.7     10/08/23  1330 -8.7 %  33.3  11.3  65.5                   Oxygen Therapy        Date/Time Resp SpO2 O2 Device FiO2 (%) O2 Flow Rate (L/min)    10/08/23 1714 16  --  --  --  --     10/08/23 1400 --  100 %  Nasal cannula  --  1 L/min  for comfort post procedure                     Pre-Hemodialysis Assessment        Date/Time Therapy Number Dialyzer Hemodialysis Line Type All Machine Alarms Passed    10/08/23 1235 2  F-180 (98 mLs)  Adult (142 m/s)  Yes       Date/Time Air Detector Saline Line Double Clampled Hemo-Safe Applied Dialysis Flow (mL/min)    10/08/23 1235 Engaged  --  --  800 mL/min       Date/Time Verify Priming Solution Priming Volume Hemodialysis Independent pH Hemodialysis Machine Conductivity (mS/cm)    10/08/23 1235 0.9% NS  300 mL  --  13.5 mS/cm       Date/Time Hemodialysis Independent Conductivity (mS/cm) Bicarb Conductivity Residual Bleach Negative Total Chlorine    10/08/23 1235 13.8 mS/cm  -- Yes  0                   Pre-Hemodialysis Treatment Comments        Date/Time Pre-Hemodialysis Comments    10/08/23 1235 arrived via wc                   Hemodialysis Treatment        Date/Time Blood Flow Rate (mL/min) Arterial Pressure (mmHg) Venous Pressure (mmHg) Transmembrane Pressure (mmHg)    10/08/23 1700 400 mL/min  -213 mmHg  172 mmHg  --     10/08/23 1630 400 mL/min  -225 mmHg  175 mmHg  --     10/08/23 1600 400 mL/min  -218 mmHg  169 mmHg  --     10/08/23 1530 400 mL/min  -215 mmHg  171 mmHg  --     10/08/23 1520 400 mL/min  -222 mmHg  166 mmHg  9 mmHg     10/08/23 1500 400 mL/min  -224 mmHg  184 mmHg  --     10/08/23 1430 400 mL/min  -211 mmHg  179 mmHg  --     10/08/23 1400 400 mL/min  -205 mmHg  176 mmHg  --     10/08/23 1330 400 mL/min  -192 mmHg  174 mmHg  --     10/08/23 1300 400 mL/min  -171 mmHg  162 mmHg  --     10/08/23 1255 400 mL/min  -170 mmHg  160 mmHg  30 mmHg       Date/Time Ultrafiltration Rate (mL/hr) Ultrafiltrate Removed (mL) Dialysate Flow Rate (mL/min) KECN (Kecn)    10/08/23 1700 300 mL/hr  1647 mL  800 ml/min  --     10/08/23 1630 150 mL/hr  1568 mL  800 ml/min  --     10/08/23 1600 150 mL/hr  1494 mL  800 ml/min  --     10/08/23 1530 0 mL/hr  1436 mL  800 ml/min  --     10/08/23 1520 0 mL/hr  1436 mL  800 ml/min  --     10/08/23 1500 640 mL/hr  1254  mL  800 ml/min  --     10/08/23 1430 640 mL/hr  942 mL  800 ml/min  --     10/08/23 1400 650 mL/hr  623 mL  800 ml/min  --     10/08/23 1330 650 mL/hr  301 mL  800 ml/min  --     10/08/23 1300 390 mL/hr  13 mL  800 ml/min  --     10/08/23 1255 390 mL/hr  0 mL  800 ml/min  --                   Hemodialysis Treatment Comments        Date/Time Intra-Hemodialysis Comments    10/08/23 1700 hd complete     10/08/23 1630 Denies needs     10/08/23 1600 VSS     10/08/23 1530 Pt sleeping.     10/08/23 1520 PT nauseated zofran given     10/08/23 1500 Pt requested and given a blanket     10/08/23 1430 vss     10/08/23 1400 VSS.     10/08/23 1330 Pt remains asleep     10/08/23 1300 Pt asleep     10/08/23 1255 PT somnolent. Post procedure                   Post Treatment        Date/Time Rinseback Volume (mL) On Line Clearance: spKt/V Total Liters Processed (L/min) Dialyzer Clearance    10/08/23 1706 300 mL  1.49 spKt/V  90.2 L/min  Moderately streaked Post Hemodialysis Treatment Comments        Date/Time Post-Hemodialysis Comments    10/08/23 1706 alert and stable                   Hemodialysis I/O        Date/Time Total Hemodialysis Replacement Volume (mL) Total Ultrafiltrate Output (mL)    10/08/23 1706 --  1000 mL                     [id number] - Medicaitons Given During Treatment  (last 4 hrs)           ** NO USER **         Medication Name Action Time Action Route Rate Dose User     [Provider Hold] ferric citrate (AURYXIA) tablet 1,050 mg On hold since Thu 10/03/2023 at 0849 until manually unheld; held by Aleen Campi, MDHold Reason: Other (See comments):Hold Comment: Not available on formulary 10/08/23 1700 Provider Hold Dose Oral   ** No User **            Vincent Gros, RN         Medication Name Action Time Action Route Rate Dose User     sevelamer (RENVELA) tablet 2,400 mg 10/08/23 1625 Not Given Oral  2,400 mg Vincent Gros, RN            Neita Goodnight, RN         Medication Name Action Time Action Route Rate Dose User     gentamicin-sodium citrate lock solution in NS 10/08/23 1708 Given Intra-cannular  2 mL Neita Goodnight, RN     gentamicin-sodium citrate lock solution in NS 10/08/23 1709 Given Intra-cannular  2.1 mL Neita Goodnight, RN     ondansetron [facility]) injection 4 mg 10/08/23 1521 Given Intravenous  4 mg Neita Goodnight, RN  Patient tolerated treatment in a  Stretcher.

## 2023-10-09 NOTE — Unmapped (Addendum)
[facility] Nephrology Hemodialysis Procedure Note     10/08/2023    Megan Robertson was seen and examined on hemodialysis    CHIEF COMPLAINT: End Stage Renal Disease    INTERVAL HISTORY: no major complaints; tunneled line placed today; some cramping with start of treatment and UF goal reduced    DIALYSIS TREATMENT DATA:  Estimated Dry Weight (kg): 81 kg (178 lb 9.2 oz) Patient Goal Weight (kg): 2.7 kg (5 lb 15.2 oz)   Pre-Treatment Weight (kg):  (UTW post procedure)    Dialysis Bath  Bath: 3 K+ / 2.5 Ca+  Dialysate Na (mEq/L): 137 mEq/L  Sodium Modeling (mEq/L): 35 mEq/L  Dialysate HCO3 (mEq/L): 35 mEq/L Dialyzer: F-180 (98 mLs)   Blood Flow Rate (mL/min): 400 mL/min Dialysis Flow (mL/min): 800 mL/min   Machine Temperature (C): 36.5 ??C (97.7 ??F)      PHYSICAL EXAM:  Vitals:  Temp:  [36.7 ??C (98.1 ??F)-37.4 ??C (99.3 ??F)] 36.9 ??C (98.4 ??F)  Heart Rate:  [77-106] 77  BP: (81-140)/(51-96) 102/64  MAP (mmHg):  [64-87] 87    General: in no acute distress, currently dialyzing in a Bed (just back from IR)  Pulmonary: clear to auscultation  Cardiovascular: regular rate and rhythm  Extremities: no significant  edema  Access: Left IJ tunneled catheter     LAB DATA:  Lab Results   Component Value Date    NA 143 10/08/2023    K 3.9 10/08/2023    CL 105 10/08/2023    CO2 24.6 10/08/2023    BUN 26 (H) 10/08/2023    CREATININE 10.04 (H) 10/08/2023    CALCIUM 7.9 (L) 10/08/2023    MG 1.8 10/08/2023    PHOS 5.4 (H) 10/08/2023    ALBUMIN 3.2 (L) 10/02/2023      Lab Results   Component Value Date    HCT 30.4 (L) 10/08/2023    WBC 9.0 10/08/2023        ASSESSMENT/PLAN:  End Stage Renal Disease on Intermittent Hemodialysis:  UF goal: 0.5-1L as tolerated  Adjust medications for a GFR <10  Avoid nephrotoxic agents  Last HD Treatment:Started (10/08/23)     Bone Mineral Metabolism:  Lab Results   Component Value Date    CALCIUM 7.9 (L) 10/08/2023    CALCIUM 7.8 (L) 10/07/2023    Lab Results   Component Value Date    ALBUMIN 3.2 (L) 10/02/2023 ALBUMIN 2.3 (L) 06/28/2023      Lab Results   Component Value Date    PHOS 5.4 (H) 10/08/2023    PHOS 4.1 10/07/2023    Lab Results   Component Value Date    PTH 261.5 (H) 10/24/2020    PTH 173.3 (H) 08/01/2020      Labs appropriate, no changes.  Continue phosphorus binder and dietary counseling.    Anemia:   Lab Results   Component Value Date    HGB 9.5 (L) 10/08/2023    HGB 9.9 (L) 10/07/2023    HGB 9.6 (L) 10/06/2023    Iron Saturation (%)   Date Value Ref Range Status   06/23/2023 40 20 - 55 % Final      Lab Results   Component Value Date    FERRITIN 425.7 (H) 06/22/2023       Anemia labs appropriate, no changes. If remains hospitalized and hgb dropping further, reassess for ESA    Vascular Access:  Vascular Access functioning well - no need for intervention  Blood Flow Rate (mL/min): 400 mL/min  IV Antibiotics to be administered at discharge:  No    This procedure was fully reviewed with the patient and/or their decision-maker. The risks, benefits, and alternatives were discussed prior to the procedure. All questions were answered and written informed consent was obtained.    Shraga Custard [name], MD  [facility] of Nephrology & Hypertension     Post HD with some oozing from dialysis catheter site in room.  Recommend monitoring overnight and dressing change in the morning

## 2023-10-10 MED ORDER — LINEZOLID 600 MG TABLET
ORAL_TABLET | Freq: Two times a day (BID) | ORAL | 0 refills | 6 days | Status: CP
Start: 2023-10-10 — End: 2023-10-16
  Filled 2023-10-09: qty 12, 6d supply, fill #0

## 2023-10-17 NOTE — Unmapped (Signed)
error 

## 2023-10-17 NOTE — Unmapped (Signed)
The [facility] has made a second and final attempt to reach this patient to refill the following medication:Cosentyx.      We have left voicemails on the following phone numbers: [phone], have sent a text message to the following phone numbers: [phone], and have sent a Mychart questionnaire..    Dates contacted: 09/03/2023  10/17/2023  Last scheduled delivery: 07/25/2023- 56 Day Supply    The patient may be at risk of non-compliance with this medication. The patient should call the [facility] at [phone]  Option 4, then Option 2: Dermatology, Gastroenterology, Rheumatology to refill medication.    Megan Robertson

## 2023-10-29 ENCOUNTER — Ambulatory Visit: Admit: 2023-10-29 | Discharge: 2023-10-30 | Payer: MEDICARE

## 2023-10-29 DIAGNOSIS — T82858A Stenosis of vascular prosthetic devices, implants and grafts, initial encounter: Principal | ICD-10-CM

## 2023-10-29 NOTE — Unmapped (Signed)
Patient Name: Megan Robertson  Medical Record Number: [id number]  Date of Service: 10/29/23    Referring Provider: Lyla Glassing, MD .    Primary Provider: Rudie Meyer, MD.    Chief Complaint: This is a 53 y.o. female who returns for follow up of her right arm AV graft. Graft was created 08/15/2023.    History of Present Illness:  Megan Robertson is 53 y.o.  female who has ESRD on hemodialysis. I created an AV graft, right brachio-axillary arc graft ( Artegraft, 5 mm )on 08/15/2023. The graft was cannulated for dialysis without problems for several treatments, then developed some erythema and pain. She was admitted for suspected infection and started on IV antibiotics. A tunneled catheter was placed in order to avoid the graft. PVL and CTA studies were done and were fairly unrevealing. There was venous outflow narrowing noted, but the graft had a brisk thrill on exam. The erythema and pain resolved and she was discharged to home     The graft has not yet been cannulated again. There have been no problems with the tunneled catheter.     Past Medical History:  Past Medical History:   Diagnosis Date    Abnormal mammogram     Abnormal Pap smear of cervix     Allergic     Anemia     Anxiety     Arthritis     Asthma     Back pain     Cancer (CMS-HCC) Ovarian    Depression     Eczema     ESRD (end stage renal disease) on dialysis (CMS-HCC)     FSGS (focal segmental glomerulosclerosis) 1997    renal biopsy at [facility]    GERD (gastroesophageal reflux disease)     Gout     H/O adenoidectomy     had adenoids removed    Hypercholesteremia     Hypertension     Keloid     Ovarian cancer (CMS-HCC)     Pancreatitis     Psoriasis     QT prolongation 06/04/2023    Seizure (CMS-HCC)     unknown etiology; none for several years    Sepsis (CMS-HCC) 09/16/2022    Stroke (CMS-HCC) 2000    Tachycardia 07/31/2020       Past Surgical History:   Past Surgical History:   Procedure Laterality Date    APPENDECTOMY      BREAST EXCISIONAL BIOPSY Right 11/28/2016    CHEMOTHERAPY      for ovarian CA in 2000    CHG US GUIDE, VASCULAR ACCESS N/A 01/29/2020    Procedure: ULTRASOUND GUIDANCE FOR VASC ACCESS REQUIRING [place] EVAL OF POTENTIAL ACCESS SITES;  Surgeon: Leona Carry, MD;  Location: MAIN OR [facility];  Service: Transplant    CHG US GUIDE, VASCULAR ACCESS N/A 06/08/2020    Procedure: ULTRASOUND GUIDANCE FOR VASC ACCESS REQUIRING [place] EVAL OF POTENTIAL ACCESS SITES;  Surgeon: Leona Carry, MD;  Location: MAIN OR [facility];  Service: Transplant    CHOLECYSTECTOMY      COMBINED HYSTEROSCOPY DIAGNOSTIC / D&C  07/07/2015    Planned for endometrial ablation, but patient had uterine perforation after D&C and unable to perform    HYSTERECTOMY      LEFT OOPHORECTOMY Left 04/12/2000    Removed with L fallopian tube for ectopic pregnancy    OOPHORECTOMY      PR ANASTOMOSIS,AV,ANY SITE Right 08/15/2023    Procedure: ARTERIOVENOUS ANASTOMOSIS, OPEN; DIRECT, ANY SITE,  UPPER EXTREMITY;  Surgeon: Dyann Ruddle, MD;  Location: [facility] OR [facility];  Service: General Surgery    PR COLONOSCOPY W/BIOPSY SINGLE/MULTIPLE  09/12/2021    Procedure: COLONOSCOPY, FLEXIBLE, PROXIMAL TO SPLENIC FLEXURE; WITH BIOPSY, SINGLE OR MULTIPLE;  Surgeon: Maris Berger, MD;  [facility];  Service: Gastroenterology    PR COLSC FLX W/RMVL OF TUMOR POLYP LESION SNARE TQ N/A 09/12/2021    Procedure: COLONOSCOPY FLEX; W/REMOV TUMOR/LES BY SNARE;  Surgeon: Maris Berger, MD;  [facility];  Service: Gastroenterology    PR CREAT AV FISTULA,AUTOGENOUS GRAFT Left 01/29/2020    Procedure: Create Av Fistula (Separt Proc); Autog Gft;  Surgeon: Leona Carry, MD;  Location: MAIN OR [facility];  Service: Transplant    PR CREAT AV FISTULA,NON-AUTOGENOUS GRAFT Left 06/08/2020    Procedure: CREATE AV FISTULA (SEPARATE PROC); NONAUTOGENOUS GRAFT (EG, BIOLOGICAL COLLAGEN, THERMOPLASTIC GRAFT);  Surgeon: Leona Carry, MD;  Location: MAIN OR [facility];  Service: Transplant    PR EXPLORATION N/FLWD SURG UPPER EXTREMITY ARTERY Left 08/02/2020    Procedure: Exploration Not Followed By Surgical Repair, Artery; Upper Extremity (Eg, Axillary, Brachial, Radial, Ulnar);  Surgeon: Earney Mallet, MD;  Location: MAIN OR [facility];  Service: Vascular    PR INCIS/DRAIN ARM,DEEP ABSC/HEMATOMA Left 08/14/2020    Procedure: INCISION AND DRAINAGE, UPPER ARM OR ELBOW AREA; DEEP ABSCESS OR HEMATOMA;  Surgeon: Earney Mallet, MD;  Location: MAIN OR [place];  Service: Vascular    PR INSERTION TUNNEL INTRAPERITONEAL CATH DIAL OPEN Midline 02/01/2021    Procedure: INSERTION OF INTRAPERITONEAL CANNULA OR CATHETER FOR DRAINAGE OR DIALYSIS; PERMANENT;  Surgeon: Loney Hering, MD;  Location: MAIN OR [place];  Service: Transplant    PR REMV ART CLOT AXILL-BRACH,ARM INCIS Left 08/02/2020    Procedure: Embolect/Thrombec; Axilry/Brachial Art-Arm Incs;  Surgeon: Earney Mallet, MD;  Location: MAIN OR [facility];  Service: Vascular    PR UPPER GI ENDOSCOPY,BIOPSY N/A 08/29/2017    Procedure: UGI ENDOSCOPY; WITH BIOPSY, SINGLE OR MULTIPLE;  Surgeon: Neysa Hotter, MD;  Location: GI PROCEDURES [place] [facility];  Service: Gastroenterology    PR VEIN BYPASS GRAFT,SUBCL-BRACHIAL Left 08/05/2020    Procedure: Bypass Graft, With Vein; Subclavian-Brachial;  Surgeon: Earney Mallet, MD;  Location: MAIN OR [facility];  Service: Vascular    SALPINGECTOMY Left 04/12/2000    Ectopic pregnancy    SALPINGECTOMY Right 11/22/2015    at time of total vaginal hysterectomy    TONSILECTOMY, ADENOIDECTOMY, BILATERAL MYRINGOTOMY AND TUBES      TOTAL VAGINAL HYSTERECTOMY  11/22/2015    With R salpingectomy       Medications:   Current Outpatient Medications on File Prior to Visit   Medication Sig Dispense Refill    albuterol HFA 90 mcg/actuation inhaler Inhale 2 puffs every six (6) hours as needed for wheezing. 8 g 3    apixaban (ELIQUIS) 2.5 mg Tab Take 1 tablet (2.5 mg total) by mouth two (2) times a day. 30 tablet 2    aspirin 81 MG chewable tablet Chew 1 tablet (81 mg total) daily. 30 tablet 1    beclomethasone dipropionate (QVAR REDIHALER) 80 mcg/actuation inhaler Inhale 1 puff  in the morning and 1 puff in the evening. 10.6 g 3    calcitriol (ROCALTROL) 0.5 MCG capsule Take 4 capsules (2 mcg total) by mouth 3 (three) times a week.      cinacalcet (SENSIPAR) 60 MG tablet Take 1 tablet (60 mg total) by mouth daily.  ferric citrate (AURYXIA) 210 mg iron Tab tablet Take 2 tablets (420 mg total) by mouth Three (3) times a day with a meal.      furosemide (LASIX) 40 MG tablet Take 1 tablet up to 3 times weekly as needed for leg swelling. (Patient taking differently: Take 1 tablet (40 mg total) by mouth daily.) 12 tablet 2    gentamicin (GARAMYCIN) 0.1 % ointment Apply 1 Application topically daily. Apply to exit site daily with dressing change      [EXPIRED] linezolid (ZYVOX) 600 mg tablet Take 1 tablet (600 mg total) by mouth every twelve (12) hours for 6 days. 12 tablet 0    methoxy peg-epoetin beta (MIRCERA INJ) Infuse 150 mcg into a venous catheter every fourteen (14) days.      mirtazapine (REMERON) 15 MG tablet Take 1 tablet (15 mg total) by mouth nightly. 30 tablet 0    nystatin (MYCOSTATIN) 100,000 unit/gram powder Apply under breasts two times daily until resolution 60 g 1    secukinumab (COSENTYX PEN, 2 PENS,) 150 mg/mL PnIj injection Inject the contents of 2 pens (300 mg total) under the skin every twenty-eight (28) days. Maintenance dose. 6 mL 3    senna (SENOKOT) 8.6 mg tablet Take 2 tablets by mouth daily. 60 tablet 2    sevelamer (RENVELA) 800 mg tablet Take 3 tablets (2,400 mg total) by mouth Three (3) times a day with a meal. 270 tablet 11    tizanidine (ZANAFLEX) 2 MG tablet Take 1 tablet (2 mg total) by mouth daily as needed. 90 tablet 0    triamcinolone (KENALOG) 0.1 % ointment Apply topically two (2) times a day as needed. Use on psoriasis until flat. 80 g 1     No current facility-administered medications on file prior to visit.         Allergies:  Allergies   Allergen Reactions    Cephalexin Itching     unknown; tolerates cefepime    Mango Itching    Peach (Prunus Persica) Itching    Wellbutrin [Bupropion Hcl] Other (See Comments)     Seizure    Egg Itching    Fish Containing Products Itching    Lactase-Rennet Nausea And Vomiting    Lexiscan [Regadenoson] Nausea And Vomiting     Patient had nausea after the Lexi then vomitted [id number]    Mushroom Itching    Vancomycin Analogues Vancomycin Infusion Reaction     Red peeling rash likely hypersensitivity        Social History:  Social History     Socioeconomic History    Marital status: Married   Tobacco Use    Smoking status: Former     Current packs/day: 0.00     Average packs/day: 2.0 packs/day for 1 year (2.0 ttl pk-yrs)     Types: Cigarettes     Start date: 06/30/2009     Quit date: 06/30/2010     Years since quitting: 13.3    Smokeless tobacco: Never   Vaping Use    Vaping status: Never Used   Substance and Sexual Activity    Alcohol use: Not Currently    Drug use: Not Currently    Sexual activity: Yes     Partners: Male     Birth control/protection: Bilateral Tubal Ligation   Other Topics Concern    Do you use sunscreen? No    Tanning bed use? No    Are you easily burned? No    Excessive sun exposure? No  Blistering sunburns? No   Social History Narrative    on disability since 1994, but does not qualify anymore since she got married and her husband has income.     Lives in [place] with husband, Lisbeth Ply and 2 children     Social Drivers of Health     Financial Resource Strain: Low Risk  (06/26/2023)    Overall Financial Resource Strain (CARDIA)     Difficulty of Paying Living Expenses: Not hard at all   Food Insecurity: No Food Insecurity (06/26/2023)    Hunger Vital Sign     Worried About Running Out of Food in the Last Year: Never true     Ran Out of Food in the Last Year: Never true   Transportation Needs: No Transportation Needs (06/26/2023)    PRAPARE - Therapist, art (Medical): No     Lack of Transportation (Non-Medical): No       Family History:   Family History   Adopted: Yes   Problem Relation Age of Onset    Heart attack Mother 66    Alcohol abuse Mother     Mental illness Mother     No Known Problems Father     No Known Problems Sister     No Known Problems Daughter     No Known Problems Maternal Grandmother     No Known Problems Maternal Grandfather     No Known Problems Paternal Grandmother     No Known Problems Paternal Grandfather     No Known Problems Brother     No Known Problems Son     No Known Problems Maternal Aunt     No Known Problems Maternal Uncle     No Known Problems Paternal Aunt     No Known Problems Paternal Uncle     No Known Problems Other     Anesthesia problems Neg Hx     Broken bones Neg Hx     Cancer Neg Hx     Clotting disorder Neg Hx     Collagen disease Neg Hx     Diabetes Neg Hx     Dislocations Neg Hx     Fibromyalgia Neg Hx     Gout Neg Hx     Hemophilia Neg Hx     Osteoporosis Neg Hx     Rheumatologic disease Neg Hx     Scoliosis Neg Hx     Severe sprains Neg Hx     Sickle cell anemia Neg Hx     Spinal Compression Fracture Neg Hx     Melanoma Neg Hx     Basal cell carcinoma Neg Hx     Squamous cell carcinoma Neg Hx     Breast cancer Neg Hx        Review of systems:     A 10-system review of systems was completed with the exception of those positives mentioned in history of present illness and past medical history is negative.      Physical examination:     BP 118/66 (BP Site: L Arm, BP Position: Sitting, BP Cuff Size: Large)  - Pulse 111  - Temp 37 ??C (98.6 ??F) (Temporal)  - Ht 157.5 cm (5' 2)  - Wt 80.5 kg (177 lb 6.4 oz)  - LMP  (LMP Unknown)  - SpO2 97%  - BMI 32.45 kg/m??     CONSTITUTIONAL: This is a well-appearing female in no acute distress, whose appearance is consistent  with stated age.   Right arm examined. There is no erythema or induration. The graft has a thrill and bruit, but on exam there is more pulsatility and the bruit is also abnormal. The findings suggest venous outflow stenosis         Assessment:   This is a 53 y.o. patient with ESRD, right arm AV graft created 08/15/2023. There are physical signs of outflow stenosis on exam. I recommend a circuit study with possible angioplasty. It is noted that she is on Eliquis and was advised that she would likely need to hold the Eliquis for 48 hours prior to the the study once it is scheduled.     Plan:    I recommended Circuit study of the right arm AV graft. I have placed the order. Will follow up depending on the findings    Dyann Ruddle, MD  10/29/2023  1:15 PM

## 2023-10-30 NOTE — Unmapped (Signed)
Reason for Visit: Hospital Follow-up Medication Management    History of Present Illness:  Megan Robertson is a 53 y.o. female with a past medical history of ESRD (HD MWF), anxiety, chronic pain who was recently hospitalized from 10/02/23 to 10/09/23 for cellulitis of R arm over fistula sight and abdominal pain from recent peritoneal dialysis catheter removal site (removed 12/10). Pt presents to the [facility] for follow-up without all of her medication bottles. Presents with her husband, who contributes to the history.    Since discharge, pt reports doing okay, though still endorsing some hand peeling from reaction to vancomycin. Confirms completing linezolid course.     Medication Adherence and Access:  Missed doses?: yes  Uses pillbox?: no  Anyone else assist with medication organization? no  Current insurance coverage: unclear; listed as self pay but denied from [facility] PAP due to having active insurance (09/2023) - denies any issues with med affordability, so appears to be some coverage for meds  Preferred Pharmacy: [facility] in [place]  Medications affordable?: yes  Needs refills? no    Allergies:   Allergies   Allergen Reactions    Cephalexin Itching     unknown; tolerates cefepime    Mango Itching    Peach (Prunus Persica) Itching    Wellbutrin [Bupropion Hcl] Other (See Comments)     Seizure    Egg Itching    Fish Containing Products Itching    Lactase-Rennet Nausea And Vomiting    Lexiscan [Regadenoson] Nausea And Vomiting     Patient had nausea after the Lexi then vomitted [id number]    Mushroom Itching    Vancomycin Analogues Vancomycin Infusion Reaction     Red peeling rash likely hypersensitivity        Medications: Medications reviewed in EPIC medication station and updated today by the clinical pharmacist practitioner.  Current Outpatient Medications on File Prior to Visit   Medication Sig Adherence    albuterol HFA 90 mcg/actuation inhaler Inhale 2 puffs every six (6) hours as needed for wheezing. Had to use a few days ago    apixaban (ELIQUIS) 2.5 mg Tab Take 1 tablet (2.5 mg total) by mouth two (2) times a day. Taking 1 tablet daily, frequently forgets PM dose    aspirin 81 MG chewable tablet Chew 1 tablet (81 mg total) daily. Taking once daily    beclomethasone dipropionate (QVAR REDIHALER) 80 mcg/actuation inhaler Inhale 1 puff  in the morning and 1 puff in the evening. Flovent 1 puff in the morning (formulary equivalent from inpatient stay)      calcitriol (ROCALTROL) 0.5 MCG capsule Take 4 capsules (2 mcg total) by mouth 3 (three) times a week. 4 with dialysis MWF    cinacalcet (SENSIPAR) 60 MG tablet Take 1 tablet (60 mg total) by mouth daily. Taking    ferric citrate (AURYXIA) 210 mg iron Tab tablet Take 2 tablets (420 mg total) by mouth Three (3) times a day with a meal. Per dispense hx, 1 tab TID, mostly just takes once daily b/c large tablet    furosemide (LASIX) 40 MG tablet Take 1 tablet up to 3 times weekly as needed for leg swelling. (Patient taking differently: Take 1 tablet (40 mg total) by mouth daily.) As needed for swelling, hasn't had to take in awhile    gentamicin (GARAMYCIN) 0.1 % ointment Apply 1 Application topically daily. Apply to exit site daily with dressing change Using    [EXPIRED] linezolid (ZYVOX) 600 mg tablet Take  1 tablet (600 mg total) by mouth every twelve (12) hours for 6 days. Completed    methoxy peg-epoetin beta (MIRCERA INJ) Infuse 150 mcg into a venous catheter every fourteen (14) days. Q2 weeks at dialysis    mirtazapine (REMERON) 15 MG tablet Take 1 tablet (15 mg total) by mouth nightly. Taking    nystatin (MYCOSTATIN) 100,000 unit/gram powder Apply under breasts two times daily until resolution As needed    secukinumab (COSENTYX PEN, 2 PENS,) 150 mg/mL PnIj injection Inject the contents of 2 pens (300 mg total) under the skin every twenty-eight (28) days. Maintenance dose. [facility] specialty pharmacy, for eczema  Last filled 07/25/23 for 56 d/s senna (SENOKOT) 8.6 mg tablet Take 2 tablets by mouth daily. Not taking    sevelamer (RENVELA) 800 mg tablet Take 3 tablets (2,400 mg total) by mouth Three (3) times a day with a meal. Usually just takes 1 tablet once a day with meal    tizanidine (ZANAFLEX) 2 MG tablet Take 1 tablet (2 mg total) by mouth daily as needed. As needed, up to once a day    triamcinolone (KENALOG) 0.1 % ointment Apply topically two (2) times a day as needed. Use on psoriasis until flat. As needed for psoriasis       Assessment and Plan:     # Medication Management   Encouraged adherence to medications as prescribed (specifically Auryxia, sevelamer, and Flovent/Qvar)  STOP apixaban given only taking 2.5mg  daily (dose per manufacturer should be 5mg  BID - do not see level drawn that would have suggested dose reduction; unsure if ever had bleeding to prompt lowering the dose). Regardless, only taking 2.5mg  once daily. 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 Dr. Jeanella Craze to send message to vascular (Dr. Coralee Rud) prior to upcoming visit late January to ensure they are okay with stopping apixaban; can restart at that visit if indicated.   Recommend investigating statin at future visit. Would benefit from high intensity statin given PAD. Lipids last checked 2022, with LDL 83. Would suggest goal <70 and recheck of lipids. Recommend consideration of high intensity statin such as atorvastatin 40-80mg  (previously on atorvastatin 80mg  but unclear via chart review why this was discontinued)  Encouraged use of medication pill box and reviewed appropriate use. Reviewed the indication, dose, and frequency of each medication with patient.     Recommendations and medication-related problems were discussed directly with the patient's transitions of care physician, Dr. Jeanella Craze, immediately following the pharmacist visit prior to the physician visit. Questions/concerns were addressed to the patient's satisfaction. I spent a total of 15 minutes face to face with the patient delivering clinical care and providing education/counseling.      Future Appointments   Date Time Provider Department Center   10/31/2023 10:00 AM [facility] TRANSITION CLINIC [facility]   10/31/2023 10:40 AM Ara Kussmaul, MD [facility]   11/05/2023 11:00 AM [facility] - DIALYSIS RM 1 [facility] [place] [facility] [place]   11/20/2023 11:00 AM [facility] OUTPATIENT 1 [facility] [place]   11/20/2023 12:15 PM Earney Mallet, MD [facility]     ___________________________________________________     Lavell Anchors, PharmD, BCACP, CPP  Family Medicine Pharmacist

## 2023-10-30 NOTE — Unmapped (Signed)
Care Management Progress Note  [facility]                 Date of Service:  10/30/2023      Service: Care Management - phone    Purpose of contact:        MSW intern call patient with an appointment reminder for [facility] tomorrow. Patient did not pick up the phone, so a voicemail was left.    Additional Information/Plan:  N/A    Ina Homes, MSW, LCSW  Manager of Population Health Clinical Services - [facility]  [phone]

## 2023-10-31 ENCOUNTER — Ambulatory Visit
Admit: 2023-10-31 | Discharge: 2023-11-01 | Payer: MEDICARE | Attending: Student in an Organized Health Care Education/Training Program | Primary: Student in an Organized Health Care Education/Training Program

## 2023-10-31 ENCOUNTER — Ambulatory Visit: Admit: 2023-10-31 | Discharge: 2023-11-01 | Payer: MEDICARE

## 2023-10-31 NOTE — Unmapped (Signed)
Transitions of Care Note    Subjective      Admission Date: 10/02/23  Discharge Date: 10/09/23  Discharge Hospital/Unit: 2 BT2 [facility]  The patient was [facility] and sent to her home.    Post discharge interactive communication via telephone was made with patient on 10/30/23 and I have reviewed the information from that communication.    Today's (11/01/2023) face to face interactive visit is NOT within 14 days days of discharge.    Chief Complaint: Patient is here in follow-up to their recent hospitalization and to discuss the following medical problems: esrd, ssti     HISTORY OF PRESENT ILLNESS:    EUDORA LOMIBAO is a 53 y.o. female who presents for transitions hospital follow up. LYRICK VALIS is a 53 y.o. female with a past medical history of ESRD (HD MWF), anxiety, chronic pain who was recently hospitalized from 10/02/23 to 10/09/23 for cellulitis of R arm over fistula sight and abdominal pain from recent peritoneal dialysis catheter removal site (removed 12/10).      Hospitalized for: ssti     Interval update: Feeling well. Saw general surgery 2 days ago - referred to vascular. Cellulitis improved.     Pt reported factors contributing to admission or ED visit: None    Patient uses pill box yes    PHQ-9 PHQ-9 TOTAL SCORE   12/18/2020  10:00 AM 12   04/09/2018   4:00 PM 17       The remaining 10 systems reviewed were negative.      Patient has been seen by the pharmacist today and I have reviewed their note and recommendations. See pharmacist note for details.  Patient has been seen by the social worker today and I have reviewed their note and recommendations.  See social work note for details.    I have reviewed the patients discharge summary for this hospitalization.  I have also reviewed the problem list, allergies, family and social history and updated them as needed.         Objective     Ms. Clemons  weight is 79.2 kg (174 lb 9.6 oz). Her temporal temperature is 36.2 ??C (97.2 ??F). Her blood pressure is 104/73 and her pulse is 109.     GEN: chronically ill appearing, NAD    HEENT: NCAT, No scleral icterus. Conjunctiva non-erythematous.   CV: Regular rate and rhythm.   Pulm: Normal work of breathing on RA.   Abd: Flat.  Nontender.  Neuro: A&O x 3. No focal deficits.   Ext: No peripheral edema.   Skin: No obvious rashes or skin lesions.    Labs:  I have reviewed the labs from this hospitalization and the ones on the day of discharge and have followed up on any pending labs at the time of discharge.  See Epic Labs section for details.         Assessment/Plan:     Problem List Items Addressed This Visit       Anemia in chronic kidney disease    Gets EPO in dialysis on MWF         ESRD (end stage renal disease) on dialysis (CMS-HCC) - Primary    MWF dialysis.     Continue home sevelamer, ferric citrate, calcitriol and cinacalcet            Secondary hyperparathyroidism of renal origin (CMS-HCC)    Cellulitis    Of AVF site. Resolved following linezolid completion. Doing  well today. RUE w/out ongoing s/o SSTI          Brachial artery thrombus (CMS-HCC)    Occurred 2021 -- s/p thromboembolectomy and L subclavian bypass. Had been started on eliquis at that time. Since 2021 -- dose fill history does not look as if patient has ever taken appropriate dosing -- 5 mg BID.     Had fill in Feb of 5 mg every day and then in ~ Aug w/ 2.5 mg BID  Today patient reports taking 2.5 mg daily (most days)     Given not on therapeutic dosing (possibly since first initiation) will recommend stopping eliquis today. Will reach out to vascular surgery -- Dr. Coralee Rud to inform of this recommendation. Can resume if vascular surgery recommends. Has f/up with vascular surgery in the next couple of weeks.           Other Visit Diagnoses         Hyperphosphatemia                 Medications prescribed or ordered upon discharge were reviewed on 10/31/23 and reconciled with the most recent outpatient medication list. I have reviewed and agree with this medication reconciliation.      The following medications changes were made:   -  STOP eliquis (taking 2.5 mg daily -- most days); subtherapeutic w/out benefit at this dose (not clear if she has ever taken appropriate 5 mg BID dosing based on fill hx and prescription signatures)   - Encouraged adherence to other meds that seemed to have discrepancies between chart and patient report:    Auryxia (iron): 1 tablet three times a day with meals    Renvela (sevelamer): 3 tablets three times a day with meals  This is your phosphate binder    Flovent (orange circle inhaler) - please take this as prescribed. When you finish the Flovent, go back to taking your Qvar  1 puff twice daily. Continue using your albuterol inhaler 2 puffs very 4-6 hours as needed for shortness of breath    Mirtazapine 15mg  - 1 tablet nightly (for mood, sleep)    Biggest Risk for Readmission: chronic conditions     Items to follow-up on next visit:   - f/up vascular surgery rec  - f/up BP  - ? Statin - candidate for high -int statin given PAD hx     Medical decision making was of moderate (40981- must be seen within 14 days) complexity.    Necessary referral have been made.  See Visit Summary for details of referrals.    I will forward my plan and recommendations to patients PCP, McConner, Maryln Manuel, MD    Follow-up with PCP or another provider has been scheduled:   Future Appointments   Date Time Provider Department Center   11/05/2023 11:00 AM [facility] IR - DIALYSIS RM 1 [facility] [place] [facility] [place]   11/20/2023 11:00 AM [facility]L OUTPATIENT 1 [facility]    11/20/2023 12:15 PM Earney Mallet, MD [facility]   01/14/2024  1:00 PM McConner, Maryln Manuel, MD [facility]        Total time spent face to face with the patient was 40 minutes of  which 30 minutes were spent counseling/coordinating care regarding her: recent hospitalization and the following conditions: ESRD, SSTI          [facility]  [place] [facility] [place] at [facility]  [address] [place], [place] [address]  Telephone [phone]  Fax [phone]  [email or web address]

## 2023-10-31 NOTE — Unmapped (Signed)
Family Medicine  Care Management Transitions of Care Note    Presenting Problem:  Megan Robertson has been identified as a Transitions patient who is at risk for readmission.    Megan Robertson is a 53 y.o. female with a past medical history of ESRD (HD MWF), anxiety, chronic pain who was recently hospitalized from 10/02/23 to 10/09/23 for cellulitis of R arm over fistula sight and abdominal pain from recent peritoneal dialysis catheter removal site (removed 12/10).  She reports feeling better since discharge and denies any red flag symptoms at this time. She had her follow-up with general surgery this week and was referred to vascular surgery, who she will be seeing later this month.         Assessment:  Social History     Socioeconomic History    Marital status: Married     Spouse name: None    Number of children: None    Years of education: None    Highest education level: None   Tobacco Use    Smoking status: Former     Current packs/day: 0.00     Average packs/day: 2.0 packs/day for 1 year (2.0 ttl pk-yrs)     Types: Cigarettes     Start date: 06/30/2009     Quit date: 06/30/2010     Years since quitting: 13.3    Smokeless tobacco: Never   Vaping Use    Vaping status: Never Used   Substance and Sexual Activity    Alcohol use: Not Currently    Drug use: Not Currently    Sexual activity: Yes     Partners: Male     Birth control/protection: Bilateral Tubal Ligation   Other Topics Concern    Do you use sunscreen? No    Tanning bed use? No    Are you easily burned? No    Excessive sun exposure? No    Blistering sunburns? No   Social History Narrative    on disability since 1994, but does not qualify anymore since she got married and her husband has income.     Lives in [place] with husband, Lisbeth Ply and 2 children     Social Drivers of Health     Financial Resource Strain: Medium Risk (10/31/2023)    Overall Financial Resource Strain (CARDIA)     Difficulty of Paying Living Expenses: Somewhat hard   Food Insecurity: Food Insecurity Present (10/31/2023)    Hunger Vital Sign     Worried About Running Out of Food in the Last Year: Sometimes true     Ran Out of Food in the Last Year: Sometimes true   Transportation Needs: No Transportation Needs (10/31/2023)    PRAPARE - Therapist, art (Medical): No     Lack of Transportation (Non-Medical): No         Home Health Services: n/a  DME: n/a  Personal Care Service/Personal Aide: n/a      Behavioral Health:  PHQ9: was not completed  GAD7: was not completed  Behavioral Health Provider: N/a  Current Mental Health Status:  Patient expresses some frustration in context of trying to manage her health conditions. She has seen a therapist in the past but not at this time. She is on mirtazapine for mood, which she feels works okay. She is not interested in connecting with a therapist or adjusting her medications at this time but reports may revisit this once she gets past her current health concerns.  Advanced Directives: does  have on file. Health Care Decision Maker was not completed today.    Intervention:  [x]  Introduced self and role at [facility].  [x]  Reviewed hospital discharge follow-up instructions.  [x]  Reviewed same day or after hours care options.  [x]  Assessed for SDOH and updated in chart. Sent resources via [employer].   [x]  Provided this CM's contact information and encouraged patient to reach out should additional needs arise.       Benjaman Lobe, LCSW  Care Manager  [facility]  [phone]      Upcoming appointments:  Future Appointments   Date Time Provider Department Center   11/05/2023 11:00 AM [facility] - DIALYSIS RM 1 [facility] [place] [facility] [place]   11/20/2023 11:00 AM [facility] PVL OUTPATIENT 1 [facility]    11/20/2023 12:15 PM Earney Mallet, MD [facility]

## 2023-10-31 NOTE — Unmapped (Addendum)
STOP apixaban. At your appointment with Dr. Coralee Rud (vascular doctor) on 1/29, you can discuss further if this medication needs to be restarted (we will also send them a message)    Please make sure you are taking the following medications as prescribed:    Auryxia (iron): 1 tablet three times a day with meals    Renvela (sevelamer): 3 tablets three times a day with meals  This is your phosphate binder    Flovent (orange circle inhaler) - please take this as prescribed. When you finish the Flovent, go back to taking your Qvar  1 puff twice daily. Continue using your albuterol inhaler 2 puffs very 4-6 hours as needed for shortness of breath    Mirtazapine 15mg  - 1 tablet nightly (for mood, sleep)

## 2023-11-01 NOTE — Unmapped (Signed)
MWF dialysis.     Continue home sevelamer, ferric citrate, calcitriol and cinacalcet

## 2023-11-01 NOTE — Unmapped (Addendum)
Of AVF site. Resolved following linezolid completion. Doing well today. RUE w/out ongoing s/o SSTI

## 2023-11-01 NOTE — Unmapped (Signed)
Occurred 2021 -- s/p thromboembolectomy and L subclavian bypass. Had been started on eliquis at that time. Since 2021 -- dose fill history does not look as if patient has ever taken appropriate dosing -- 5 mg BID.     Had fill in Feb of 5 mg every day and then in ~ Aug w/ 2.5 mg BID  Today patient reports taking 2.5 mg daily (most days)     Given not on therapeutic dosing (possibly since first initiation) will recommend stopping eliquis today. Will reach out to vascular surgery -- Dr. Coralee Rud to inform of this recommendation. Can resume if vascular surgery recommends. Has f/up with vascular surgery in the next couple of weeks.

## 2023-11-01 NOTE — Unmapped (Signed)
Gets EPO in dialysis on MWF

## 2023-11-01 NOTE — Unmapped (Signed)
 VIR pre procedure prep call completed. Reviewed to arrive 1 hour prior to appointment time- 1000. Informed of no show/late cancellation policy. NPO guidelines reviewed. Pt OK to take sips of clear liquids with all AM meds- clear liquids until 0800. Pt aware of need for driver >53 years of age able to stay throughout procedure and recovery. Pt verbalized understanding. All questions answered.

## 2023-11-01 NOTE — Unmapped (Deleted)
Continue home sevelamer, ferric citrate, calcitriol and cinacalcet on discharge.

## 2023-11-05 ENCOUNTER — Inpatient Hospital Stay: Admit: 2023-11-05 | Discharge: 2023-11-06 | Payer: MEDICARE

## 2023-11-05 MED ADMIN — fentaNYL (PF) (SUBLIMAZE) injection: INTRAVENOUS | @ 16:00:00 | Stop: 2023-11-05

## 2023-11-05 MED ADMIN — heparin (porcine) 1000 unit/mL injection: INTRAVENOUS | @ 17:00:00 | Stop: 2023-11-05

## 2023-11-05 MED ADMIN — lidocaine (XYLOCAINE) 10 mg/mL (1 %) injection: SUBCUTANEOUS | @ 16:00:00 | Stop: 2023-11-05

## 2023-11-05 MED ADMIN — diphenhydrAMINE (BENADRYL) injection: 25 mg | INTRAVENOUS | @ 16:00:00 | Stop: 2023-11-05

## 2023-11-05 MED ADMIN — midazolam (VERSED) injection: INTRAVENOUS | @ 16:00:00 | Stop: 2023-11-05

## 2023-11-05 NOTE — Unmapped (Signed)
[place] INTERVENTIONAL NEPHROLOGY - Operative Note     Post-Procedure Note    Procedure Name: Fistulogram of the right upper arm brachial axillary AV graft with angioplasty of the venous anastomosis stenosis.    Pre-Op Diagnosis: ESRD patient with a pulsatile AV graft in the right upper arm.    Post-Op Diagnosis: Same as pre-operative diagnosis    Attending Physician:  Gaylyn Lambert, MD    Time out: Prior to the procedure, a time out was performed with all team members present. During the time out, the patient, procedure and procedure site when applicable were verbally verified by the team members and Gaylyn Lambert, MD.    Description of procedure: Fistulogram of the right upper arm brachio axillary AV graft was performed and demonstrated a patent AV graft with 90% stenosis at the venous anastomosis with multiple collaterals around the venous anastomosis and 70% stenosis in the axillary vein.  A reflux angiogram was performed and demonstrated a patent arterial anastomosis with no significant stenotic lesions.  The central veins are patent with no significant stenotic lesions.    Successful angioplasty of the 90% venous anastomosis stenosis and the 70% axillary vein stenosis with a 7 mm x 40 mm conquest balloon.    Plan:  1.  Monitor access flows and venous pressures closely during dialysis.  2.  Remove sheath suture at the next dialysis session.  3.  Okay to start cannulating the brachial axillary AV graft for hemodialysis.    Sedation:Moderate sedation    Estimated Blood Loss: approximately <10 mL  Complications: None    See detailed procedure note with images in PACS [facility]).    The patient tolerated the procedure well without incident or complication and left the room in stable condition.    Gaylyn Lambert, MD  11/05/2023 11:44 AM

## 2023-11-05 NOTE — Unmapped (Signed)
[place] INTERVENTIONAL NEPHROLOGY- Pre Procedure H/P  Patient name: Megan Robertson  CSN: [id number]  MRN: [id number]  Date of Procedure: 11/05/2023    Assessment/Plan:    [name] is a 53 y.o. female who will undergo fistulogram with possible angioplasty and/or stent placement under moderate sedation in interventional nephrology at [facility] endovascular center.     Consent obtained in the Pre Op holding area by Dr. Tiffany Kocher.  Risks, benefits, and alternatives including but not limited to risks like bleeding, blood clots, infection, embolism, thromboembolism, pain, vascular injury were discussed with patient/patient's representative. All questions were answered to patient/patient's representative satisfaction.  Patient/Patient's representative consents and would like to proceed with the procedure.   --The patient will accept blood products in an emergent situation.  --The patient does not have a Do Not Resuscitate order in effect.      HPI: [name] is a 53 y.o. female with ESRD on intermittent hemodialysis via a left internal jugular vein tunneled hemodialysis catheter comes in for a fistulogram of her right upper arm brachial axillary AV graft which was placed in October 2024 by Dr. Dyann Ruddle.  Patient was noted to have increased pulsatility and abnormal bruit with suspicion for venous outflow stenosis.  Patient denies any other complaints today.    Past Medical History:   Diagnosis Date    Abnormal mammogram     Abnormal Pap smear of cervix     Allergic     Anemia     Anxiety     Arthritis     Asthma     Back pain     Cancer (CMS-HCC) Ovarian    Depression     Eczema     ESRD (end stage renal disease) on dialysis (CMS-HCC)     FSGS (focal segmental glomerulosclerosis) 1997    renal biopsy at [facility]    GERD (gastroesophageal reflux disease)     Gout     H/O adenoidectomy     had adenoids removed    Hypercholesteremia     Hypertension     Keloid     Ovarian cancer (CMS-HCC)     Pancreatitis     Psoriasis     QT prolongation 06/04/2023    Seizure (CMS-HCC)     unknown etiology; none for several years    Sepsis (CMS-HCC) 09/16/2022    Stroke (CMS-HCC) 2000    Tachycardia 07/31/2020       Past Surgical History:   Procedure Laterality Date    APPENDECTOMY      BREAST EXCISIONAL BIOPSY Right 11/28/2016    CHEMOTHERAPY      for ovarian CA in 2000    CHG US GUIDE, VASCULAR ACCESS N/A 01/29/2020    Procedure: ULTRASOUND GUIDANCE FOR VASC ACCESS REQUIRING [place] EVAL OF POTENTIAL ACCESS SITES;  Surgeon: Leona Carry, MD;  Location: MAIN OR [facility];  Service: Transplant    CHG US GUIDE, VASCULAR ACCESS N/A 06/08/2020    Procedure: ULTRASOUND GUIDANCE FOR VASC ACCESS REQUIRING [place] EVAL OF POTENTIAL ACCESS SITES;  Surgeon: Leona Carry, MD;  Location: MAIN OR [facility];  Service: Transplant    CHOLECYSTECTOMY      COMBINED HYSTEROSCOPY DIAGNOSTIC / D&C  07/07/2015    Planned for endometrial ablation, but patient had uterine perforation after D&C and unable to perform    HYSTERECTOMY      LEFT OOPHORECTOMY Left 04/12/2000    Removed with L fallopian tube for ectopic pregnancy    OOPHORECTOMY  PR ANASTOMOSIS,AV,ANY SITE Right 08/15/2023    Procedure: ARTERIOVENOUS ANASTOMOSIS, OPEN; DIRECT, ANY SITE, UPPER EXTREMITY;  Surgeon: Dyann Ruddle, MD;  Location: [facility] OR [facility];  Service: General Surgery    PR COLONOSCOPY W/BIOPSY SINGLE/MULTIPLE  09/12/2021    Procedure: COLONOSCOPY, FLEXIBLE, PROXIMAL TO SPLENIC FLEXURE; WITH BIOPSY, SINGLE OR MULTIPLE;  Surgeon: Maris Berger, MD;  [facility];  Service: Gastroenterology    PR COLSC FLX W/RMVL OF TUMOR POLYP LESION SNARE TQ N/A 09/12/2021    Procedure: COLONOSCOPY FLEX; W/REMOV TUMOR/LES BY SNARE;  Surgeon: Maris Berger, MD;  [facility];  Service: Gastroenterology    PR CREAT AV FISTULA,AUTOGENOUS GRAFT Left 01/29/2020    Procedure: Create Av Fistula (Separt Proc); Autog Gft;  Surgeon: Leona Carry, MD; Location: MAIN OR [facility];  Service: Transplant    PR CREAT AV FISTULA,NON-AUTOGENOUS GRAFT Left 06/08/2020    Procedure: CREATE AV FISTULA (SEPARATE PROC); NONAUTOGENOUS GRAFT (EG, BIOLOGICAL COLLAGEN, THERMOPLASTIC GRAFT);  Surgeon: Leona Carry, MD;  Location: MAIN OR [facility];  Service: Transplant    PR EXPLORATION N/FLWD SURG UPPER EXTREMITY ARTERY Left 08/02/2020    Procedure: Exploration Not Followed By Surgical Repair, Artery; Upper Extremity (Eg, Axillary, Brachial, Radial, Ulnar);  Surgeon: Earney Mallet, MD;  Location: MAIN OR [facility];  Service: Vascular    PR INCIS/DRAIN ARM,DEEP ABSC/HEMATOMA Left 08/14/2020    Procedure: INCISION AND DRAINAGE, UPPER ARM OR ELBOW AREA; DEEP ABSCESS OR HEMATOMA;  Surgeon: Earney Mallet, MD;  Location: MAIN OR [place];  Service: Vascular    PR INSERTION TUNNEL INTRAPERITONEAL CATH DIAL OPEN Midline 02/01/2021    Procedure: INSERTION OF INTRAPERITONEAL CANNULA OR CATHETER FOR DRAINAGE OR DIALYSIS; PERMANENT;  Surgeon: Loney Hering, MD;  Location: MAIN OR ;  Service: Transplant    PR REMV ART CLOT AXILL-BRACH,ARM INCIS Left 08/02/2020    Procedure: Embolect/Thrombec; Axilry/Brachial Art-Arm Incs;  Surgeon: Earney Mallet, MD;  Location: MAIN OR [facility];  Service: Vascular    PR UPPER GI ENDOSCOPY,BIOPSY N/A 08/29/2017    Procedure: UGI ENDOSCOPY; WITH BIOPSY, SINGLE OR MULTIPLE;  Surgeon: Neysa Hotter, MD;  Location: GI PROCEDURES [facility];  Service: Gastroenterology    PR VEIN BYPASS GRAFT,SUBCL-BRACHIAL Left 08/05/2020    Procedure: Bypass Graft, With Vein; Subclavian-Brachial;  Surgeon: Earney Mallet, MD;  Location: MAIN OR [facility];  Service: Vascular    SALPINGECTOMY Left 04/12/2000    Ectopic pregnancy    SALPINGECTOMY Right 11/22/2015    at time of total vaginal hysterectomy    TONSILECTOMY, ADENOIDECTOMY, BILATERAL MYRINGOTOMY AND TUBES      TOTAL VAGINAL HYSTERECTOMY  11/22/2015    With R salpingectomy Allergies:   Allergies   Allergen Reactions    Cephalexin Itching     unknown; tolerates cefepime    Mango Itching    Peach (Prunus Persica) Itching    Wellbutrin [Bupropion Hcl] Other (See Comments)     Seizure    Egg Itching    Fish Containing Products Itching    Lactase-Rennet Nausea And Vomiting    Lexiscan [Regadenoson] Nausea And Vomiting     Patient had nausea after the Lexi then vomitted [id number]    Mushroom Itching    Vancomycin Analogues Vancomycin Infusion Reaction     Red peeling rash likely hypersensitivity        Medications:  No relevant medications, please see full medication list in Epic.    ASA Grade: ASA 3 - Patient with moderate systemic disease with  functional limitations    PE:    Vitals:    11/05/23 1014   BP: 106/81   Pulse: 101   Resp: 18   Temp: 36.5 ??C (97.7 ??F)   SpO2: 99%     General: female in NAD.  Airway assessment: Class 2 - Can visualize soft palate and fauces, tip of uvula is obscured  Lungs: Respirations nonlabored  Extremities: Right upper arm brachial axillary AV graft has a positive thrill and bruit.  Slightly pulsatile on palpation.  Augmentation is normal.

## 2023-11-05 NOTE — Unmapped (Signed)
Dressing clean, dry, and intact.  Discharge instructions reviewed and given to pt and family.  Understanding verbalized.  CVL Heparin locked.  Woggle in place.  Home supplies provided.  Pt ambulatory without assistance.  Discharged from PRU in stable condition.

## 2023-11-07 NOTE — Unmapped (Signed)
Post call- LVM with call back number for any questions or concerns.

## 2023-11-20 ENCOUNTER — Inpatient Hospital Stay: Admit: 2023-11-20 | Payer: MEDICARE

## 2023-11-20 ENCOUNTER — Ambulatory Visit: Admit: 2023-11-20 | Payer: MEDICARE | Attending: Vascular Surgery | Primary: Vascular Surgery

## 2023-11-20 NOTE — Unmapped (Signed)
Patient no showed today, called to reschedule. lvm

## 2023-12-06 DIAGNOSIS — I998 Other disorder of circulatory system: Principal | ICD-10-CM

## 2023-12-09 DIAGNOSIS — E212 Other hyperparathyroidism: Principal | ICD-10-CM

## 2023-12-09 DIAGNOSIS — Z95828 Presence of other vascular implants and grafts: Principal | ICD-10-CM

## 2023-12-09 NOTE — Unmapped (Signed)
 Patient: Megan Robertson, 12/23/1970, Megan Robertson, F  Dialysis Location: [facility]  Attending Nephrologist: Jackelyn Poling    Service Date: 12/09/2023  Service Provider: Jackelyn Poling, MD  I met face to face with the patient today.    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 Referral sent to [facility]. She has medically resistant hyperparathyroidism. Will refer to Dr. Selena Batten for parathyroidectomy.     11/18/2022: Doing well today no issues.    11/04/23    BP 118/79 mmHg.    09/23/23    Doing well today. Goal is to transition to home hemodialysis.  Using right brachio-axillary arteriovenous arc graft.    08/26/23    Tolerating HD well.  Surgery 08/15/23 Creation of right brachio-axillary arteriovenous arc graft with 5 mm Artegraft .    08/21/23    Tolerating dialysis well. BP 122/66 mmHg. Has T RADS 4 thyroid nodule-referred to [facility] for FNA.     Hgb low at 7.4 g/dl on Mircera 109 mcg every 2 weeks. Needs Venofer 100 mg iv x 10.      07/29/2023    Tolerating dialysis well. Schedule for access surgery 08/15/23.    --------------------------------------------------------------------    06/29/23    BP has been running high. Stopped midodrine and it is responding appropriately Will also dec EDW    8/21: Patient reporting mild abd pain with day dwells. She continues to report decreased appetite, which is worsened by day dwells. Denies any pain with walking. She does not have fill pain, drain pain, cloudy/bloody effluent, signs or symptoms of infection at exit site.  Having regular bowel movements recently. Also states her BP are improved with start of midodrine.    Medications and labs reviewed.    HOME MEDICATIONS    Home Medications:     Auryxia (ferric citrate) 210 mg iron, by mouth, Take 3 tablet three times a day with meals    Sensipar (cinacalcet) 90 mg, by mouth, Take 1 tablet once a day with meals    Miralax (polyethylene glycol 3350) 17 gram/dose, by mouth, 1 scoop once a day for constipation    nystatin 100,000 unit/gram, to skin, Apply twice a day Apply twice daily under breasts until resolution.    Eliquis (apixaban) 2.5 mg, by mouth, Take 1 tablet twice a day    clobetasol 0.05%, Apply every night On outside of vulva nightly for 2 weeks then twice a week thereafter as needed    beclomethasone dipropionate 80 mcg/actuation, Inhale 1 puff twice a day 1 puff in morning, 1 puffs in evening as needed    Ecotrin Low Strength (aspirin) 81 mg, by mouth, Take 1 tablet once a day    adalimumab 40 mg/0.8 mL, subcutaneously, Inject 1 pen injector every two weeks    senna (sennosides) 8.6 mg, by mouth, Take 1 tablet once a day as needed For constipation    midodrine 5 mg, by mouth, 1 tablet twice a day as needed Take one tablet in am, then a second dose in the afternoon if SBP is <100.    furosemide 40 mg, by mouth, Take 1 tablet every six to eight hours as needed    triamcinolone acetonide 0.1%, to affected area, Apply twice a day as needed    atorvastatin 80 mg, by mouth, Take 1 tablet  once a day as needed    tizanidine 2 mg, by mouth, Take 1 tablet twice a day as needed    albuterol sulfate 90 mcg/actuation, using inhaler, Inhale 2 puff every four hours as needed For wheezing    Allergies:     cephalexin, Wellbutrin, mango, peach, egg, Fish Containing Products, lactase, Lexiscan, mushroom, tomato    LAST HOSPITALIZATION  Discharge Diagnosis: L03.90 Cellulitis, unspecified  Admission Date 10/02/23  Discharge Date 10/09/23    Comments: 06/03/23 - for hypotension - attributed to poor PO intake    TRANSPLANT  Comments: Has counseling requirement.  Reviewed with SW today.  Started 02/05/23 with Leafy Half, MD.  Self-pay currently so deferred.    DIALYSIS PRESCRIPTION    IHD 3x Week Start date: 10/30/23    Dialyzer: 180NRe Optiflux    BFR: 400    DFR: Manual 800    Potassium: 3.0    Sodium: 137    EDW: 79.5    Duration: 4:00    Calcium: 2.5    Bicarb: 35    Rx updated on: 10/30/2023    TREATMENT ASSESSMENT    Blood pressure controlled. No changes indicated.     BP Stand Pre    12/06/2023: 143/70    12/04/2023: 155/59    12/02/2023: 156/87    BP Sit Pre    12/06/2023: 129/54    12/04/2023: 134/50    12/02/2023: 137/91    BP Stand Post    12/06/2023: 139/76    12/04/2023: 144/71    12/02/2023: 198/69    BP Sit Post    12/06/2023: 133/68    12/04/2023: 152/71    12/02/2023: 130/65    Tx Duration    12/06/2023: 4:04    12/04/2023: 4:12    12/02/2023: 4:01    Missed Treatments  0 - last 30 days  0 - last 60 days    FLUID ASSESSMENT    Fluid status acceptable. Interdialytic weight gain acceptable. No changes indicated.     EDW (kg)    12/06/2023: 79.5    12/04/2023: 79.5    12/02/2023: 79.5    Weight Pre (kg)    12/06/2023: 81.0    12/04/2023: 80.3    12/02/2023: 81.1    Weight Post (kg)    12/06/2023: 79.4    12/04/2023: 79.3    12/02/2023: 79.2    PWV (kg)    12/06/2023: -0.1    12/04/2023: -0.2    12/02/2023: -0.3    UF Rate (mL/kg/hr)    12/06/2023: 5    12/04/2023: 3    12/02/2023: 6    ADEQUACY ASSESSMENT    Adequacy target met. Prescription compliance acceptable. No changes indicated.     spKt/V, URR    11/06/2023: 1.51, 74.0    10/18/2023: 1.25, 65.0    08/28/2023: 2.08, 83.0    ACCESS ASSESSMENT    Access Type: AVGraft    Access SubType: Biological - Bovine    Access Status: Active (In Use) - 10/01/2023    Access Location: Right Upper Arm    Created: --/--/----    Flow    12/06/2023: 603    11/22/2023: >2000    Comments: Right upper arm bovine AVG functioning well.     Vascular access reviewed. Current access is permanent and functioning well.    ANEMIA ASSESSMENT    HGB at goal. Iron parameters not acceptable. ESA dose adequate. Medications adjusted.     HGB    12/04/2023: 10.2  11/27/2023: 10.5    11/19/2023: 11.3      Ferritin    09/11/2023: 67.0 07/05/2023: 218.0    06/12/2023: 294.0    Mircera, IVP (mcg)    12/02/2023: 75    11/11/2023: 100    10/25/2023: 150    BMM ASSESSMENT  Comments: Refer to Dr. Selena Batten.    PTH elevated. Calcium low. Phosphorus elevated. BMM meds adherence not acceptable. Referred for parathyroidectomy.     PTH, Intact    11/06/2023: 1774.0    10/18/2023: 1435.0    09/11/2023: 877.0      Calcium, Phosphorus    11/06/2023: 8.6, 5.8    10/18/2023: 8.1, 7.0    09/11/2023: 9.3, 7.0    Vitamin D (Calcitriol) Oral (mcg)    12/06/2023: 2.25    12/04/2023: 2.25    12/02/2023: 2.25    NUTRITION ASSESSMENT    Potassium controlled. Albumin controlled. No changes indicated.     Potassium, Albumin    11/06/2023: 3.8, 3.7    10/18/2023: 4.3, 3.6    09/11/2023: 4.1, 4.0      eNPCR    11/06/2023: 0.68    10/18/2023: 0.64    08/28/2023: 0.8    PHYSICAL EXAM    Exam Performed. Vital Signs Reviewed. Lungs - Clear. CV - Blood pressure noted. CV - RRR. EXT - No edema. EXT - No ulcers. AVF/AVG Positive thrill/bruit.    DIAGNOSIS  Chief Complaint: N18.6 End stage renal disease    Comments: ESRD - continue current regimen.  Transition to home training for HHD when staff available.     Access -  right brachio-axillary arteriovenous functioning well.      Anemia - oral iron (declines iv iron due to intolerance)    BMM-PTH 1774 pg/ml, calcium 8.6 mg/dl, phosphorus 5.5 mg/dl on Auryxia 3 096 mg tabs with meals. On Sensipar 90 mg daily. Calcitriol 2.25 mcg TIW. Refer for parathyroidectomy.    T RADS 4 thyroid nodule - cytology showed atypia of undetermined significance (Bethesda category III)-await Thyroseq negative-thyroid US in one year.    Patient is stable.      Patient data updated 12/09/2023 at 12:04 PM  Signed By: Jackelyn Poling, MD  on 12/09/2023 12:08:48 PM

## 2023-12-10 ENCOUNTER — Inpatient Hospital Stay: Admit: 2023-12-10 | Discharge: 2023-12-11 | Payer: MEDICARE

## 2023-12-10 NOTE — Unmapped (Signed)
 VIR Post-Procedure Note    Procedure Name: IR REMOVE TUNNELED CATHETER [[id number]]    Pre-Op Diagnosis: ESRD    Post-Op Diagnosis: Same as pre-operative diagnosis    VIR Providers    Time out: Prior to the procedure, a time out was performed with all team members present. During the time out, the patient, procedure and procedure site when applicable were verbally verified by the team members and Trude Mcburney, MD.     Attending: Dr. Trude Mcburney, MD  Assistant: None    Description of procedure: Bedside removal of LIJ tunneled dual lumen hemodialysis catheter. Heparin was withdrawn from catheter prior to removal. Catheter and cuff removed intact. Sterile dressing applied.    Sedation: None    Estimated Blood Loss: approximately  mL  Specimens: None   Contrast: 0 mL  Complications: None    See detailed procedure note with images in PACS ([email or web address]).    The patient tolerated the procedure well without incident or complication and was returned to the PRU in stable condition.    Trude Mcburney, MD  12/10/2023 9:46 AM

## 2023-12-10 NOTE — Unmapped (Signed)
 Dressing clean, dry, and intact.  Discharge instructions reviewed and given to pt and family.  Understanding verbalized. Home supplies provided. Pt ambulatory without assistance.  Discharged from PRU in stable condition.

## 2023-12-10 NOTE — Unmapped (Signed)
 Assessment/Plan:    Megan Robertson is a 53 y.o. female who will undergo tunneled central venous catheter removal in Interventional Radiology.    Verbal consent obtained from the patient.    HPI: Megan Robertson is a 53 y.o. female with ESRD on iHD. She has a RUE brachioaxillary AVG that has been successfully utilized multiple times for dialysis since her last fistulagram and intervention in January 2025. She presents for removal of LIJ tunneled dual lumen hemodialysis catheter today.    Allergies:   Allergies   Allergen Reactions    Cephalexin Itching     unknown; tolerates cefepime    Mango Itching    Peach (Prunus Persica) Itching    Wellbutrin [Bupropion Hcl] Other (See Comments)     Seizure    Egg Itching    Fish Containing Products Itching    Iodinated Contrast Media Itching    Lactase-Rennet Nausea And Vomiting    Lexiscan [Regadenoson] Nausea And Vomiting     Patient had nausea after the Lexi then vomitted [id number]    Mushroom Itching    Vancomycin Analogues Vancomycin Infusion Reaction     Red peeling rash likely hypersensitivity        Medications:   ASA 81mg , Eliquis (held)    ASA Grade: ASA 3 - Patient with moderate systemic disease with functional limitations    PSH:   Past Surgical History:   Procedure Laterality Date    APPENDECTOMY      BREAST EXCISIONAL BIOPSY Right 11/28/2016    CHEMOTHERAPY      for ovarian CA in 2000    CHG US GUIDE, VASCULAR ACCESS N/A 01/29/2020    Procedure: ULTRASOUND GUIDANCE FOR VASC ACCESS REQUIRING [place] EVAL OF POTENTIAL ACCESS SITES;  Surgeon: Leona Carry, MD;  Location: MAIN OR [facility];  Service: Transplant    CHG US GUIDE, VASCULAR ACCESS N/A 06/08/2020    Procedure: ULTRASOUND GUIDANCE FOR VASC ACCESS REQUIRING [place] EVAL OF POTENTIAL ACCESS SITES;  Surgeon: Leona Carry, MD;  Location: MAIN OR [facility];  Service: Transplant    CHOLECYSTECTOMY      COMBINED HYSTEROSCOPY DIAGNOSTIC / D&C  07/07/2015    Planned for endometrial ablation, but patient had uterine perforation after D&C and unable to perform    HYSTERECTOMY      LEFT OOPHORECTOMY Left 04/12/2000    Removed with L fallopian tube for ectopic pregnancy    OOPHORECTOMY      PR ANASTOMOSIS,AV,ANY SITE Right 08/15/2023    Procedure: ARTERIOVENOUS ANASTOMOSIS, OPEN; DIRECT, ANY SITE, UPPER EXTREMITY;  Surgeon: Dyann Ruddle, MD;  Location: [facility] OR [facility];  Service: General Surgery    PR COLONOSCOPY W/BIOPSY SINGLE/MULTIPLE  09/12/2021    Procedure: COLONOSCOPY, FLEXIBLE, PROXIMAL TO SPLENIC FLEXURE; WITH BIOPSY, SINGLE OR MULTIPLE;  Surgeon: Maris Berger, MD;  [facility];  Service: Gastroenterology    PR COLSC FLX W/RMVL OF TUMOR POLYP LESION SNARE TQ N/A 09/12/2021    Procedure: COLONOSCOPY FLEX; W/REMOV TUMOR/LES BY SNARE;  Surgeon: Maris Berger, MD;  [facility];  Service: Gastroenterology    PR CREAT AV FISTULA,AUTOGENOUS GRAFT Left 01/29/2020    Procedure: Create Av Fistula (Separt Proc); Autog Gft;  Surgeon: Leona Carry, MD;  Location: MAIN OR [facility];  Service: Transplant    PR CREAT AV FISTULA,NON-AUTOGENOUS GRAFT Left 06/08/2020    Procedure: CREATE AV FISTULA (SEPARATE PROC); NONAUTOGENOUS GRAFT (EG, BIOLOGICAL COLLAGEN, THERMOPLASTIC GRAFT);  Surgeon: Leona Carry, MD;  Location: MAIN  OR [place];  Service: Transplant    PR EXPLORATION N/FLWD SURG UPPER EXTREMITY ARTERY Left 08/02/2020    Procedure: Exploration Not Followed By Surgical Repair, Artery; Upper Extremity (Eg, Axillary, Brachial, Radial, Ulnar);  Surgeon: Earney Mallet, MD;  Location: MAIN OR [facility];  Service: Vascular    PR INCIS/DRAIN ARM,DEEP ABSC/HEMATOMA Left 08/14/2020    Procedure: INCISION AND DRAINAGE, UPPER ARM OR ELBOW AREA; DEEP ABSCESS OR HEMATOMA;  Surgeon: Earney Mallet, MD;  Location: MAIN OR [place];  Service: Vascular    PR INSERTION TUNNEL INTRAPERITONEAL CATH DIAL OPEN Midline 02/01/2021    Procedure: INSERTION OF INTRAPERITONEAL CANNULA OR CATHETER FOR DRAINAGE OR DIALYSIS; PERMANENT;  Surgeon: Loney Hering, MD;  Location: MAIN OR [place];  Service: Transplant    PR REMV ART CLOT AXILL-BRACH,ARM INCIS Left 08/02/2020    Procedure: Embolect/Thrombec; Axilry/Brachial Art-Arm Incs;  Surgeon: Earney Mallet, MD;  Location: MAIN OR [facility];  Service: Vascular    PR UPPER GI ENDOSCOPY,BIOPSY N/A 08/29/2017    Procedure: UGI ENDOSCOPY; WITH BIOPSY, SINGLE OR MULTIPLE;  Surgeon: Neysa Hotter, MD;  Location: GI PROCEDURES [place] [facility];  Service: Gastroenterology    PR VEIN BYPASS GRAFT,SUBCL-BRACHIAL Left 08/05/2020    Procedure: Bypass Graft, With Vein; Subclavian-Brachial;  Surgeon: Earney Mallet, MD;  Location: MAIN OR [facility];  Service: Vascular    SALPINGECTOMY Left 04/12/2000    Ectopic pregnancy    SALPINGECTOMY Right 11/22/2015    at time of total vaginal hysterectomy    TONSILECTOMY, ADENOIDECTOMY, BILATERAL MYRINGOTOMY AND TUBES      TOTAL VAGINAL HYSTERECTOMY  11/22/2015    With R salpingectomy       PMH:   Past Medical History:   Diagnosis Date    Abnormal mammogram     Abnormal Pap smear of cervix     Allergic     Anemia     Anxiety     Arthritis     Asthma     Back pain     Cancer (CMS-HCC) Ovarian    Depression     Eczema     ESRD (end stage renal disease) on dialysis (CMS-HCC)     FSGS (focal segmental glomerulosclerosis) 1997    renal biopsy at [facility]    GERD (gastroesophageal reflux disease)     Gout     H/O adenoidectomy     had adenoids removed    Hypercholesteremia     Hypertension     Keloid     Ovarian cancer (CMS-HCC)     Pancreatitis     Psoriasis     QT prolongation 06/04/2023    Seizure (CMS-HCC)     unknown etiology; none for several years    Sepsis (CMS-HCC) 09/16/2022    Stroke (CMS-HCC) 2000    Tachycardia 07/31/2020       PE:    There were no vitals filed for this visit.  General: WD, WN female in NAD.   HEENT: Normocephalic, atraumatic.   Lungs: Respirations nonlabored      Trude Mcburney, MD   12/10/2023, 9:43 AM

## 2023-12-10 NOTE — Unmapped (Signed)
 Multidisciplinary Oncology Program Intake Form    Referral Receive Date:  12/09/23    Rapid Access Appointment : No -  Patient not in scope for Rapid Access    Reason for Referral:   Initial Consultation: No treatment started and no confirmed diagnosis   Disease Group: Endocrine  Specialty:  Surgery    Referral Method:  Epic    Insurance  Primary Insurance: Private/ACA/Employer     Authorization Obtained: Yes      Record Collection:     RECORDS -  INTERNAL REFERRAL    Screening Questions:     If <43, is the patient interested in learning about [facility] Fertility Preservation? No    Close the Loop Communication:  Referring Provider Contacted: No  Patient Contacted: Yes    Welcome Packet   Date Sent: 12/10/23  Sent: Email

## 2023-12-19 ENCOUNTER — Inpatient Hospital Stay: Admit: 2023-12-19 | Discharge: 2023-12-20 | Payer: MEDICARE

## 2023-12-19 DIAGNOSIS — T82868A Thrombosis of vascular prosthetic devices, implants and grafts, initial encounter: Principal | ICD-10-CM

## 2023-12-19 MED ADMIN — midazolam (PF) (VERSED) 1 mg/mL injection: INTRAVENOUS | @ 20:00:00 | Stop: 2023-12-19

## 2023-12-19 MED ADMIN — lidocaine (XYLOCAINE) 10 mg/mL (1 %) injection: SUBCUTANEOUS | @ 20:00:00 | Stop: 2023-12-19

## 2023-12-19 MED ADMIN — diphenhydrAMINE (BENADRYL) injection: 25 mg | INTRAVENOUS | @ 20:00:00 | Stop: 2023-12-19

## 2023-12-19 MED ADMIN — lidocaine (XYLOCAINE) 10 mg/mL (1 %) injection: SUBCUTANEOUS | @ 21:00:00 | Stop: 2023-12-19

## 2023-12-19 MED ADMIN — fentaNYL (PF) (SUBLIMAZE) injection: INTRAVENOUS | @ 20:00:00 | Stop: 2023-12-19

## 2023-12-19 MED ADMIN — iohexol (OMNIPAQUE) 240 mg iodine/mL solution 40 mL: 40 mL | @ 21:00:00 | Stop: 2023-12-19

## 2023-12-19 MED ADMIN — methylPREDNISolone sodium succinate (PF) (SOLU-Medrol) injection 40 mg: 40 mg | INTRAVENOUS | @ 20:00:00 | Stop: 2023-12-19

## 2023-12-19 NOTE — Unmapped (Signed)
 On phone with Pt to try and get Pt to [facility] but phone disconnected while spouse trying to find out how long Pt would be in [place] appt. Msg left with Pt and spouse's phone to let Pt know if able to arrive to [facility] by 2:30PM today, procedure can be done today. Otherwise, Pt made aware of available spots on Monday.

## 2023-12-19 NOTE — Unmapped (Signed)
 VIR Post-Procedure Note    Procedure Name: Dialysis Circuit Study    Pre-Op Diagnosis & indication: Clotted access    Post-Op Diagnosis: Same as pre-operative diagnosis    VIR Providers (Attending Physician): Elsworth Soho, MD, MD    Time out: Prior to the procedure, a time out was performed with all team members present. During the time out, the patient, procedure and procedure site when applicable were verbally verified by the team members and Dr. Elsworth Soho, MD.    Description of procedure: Successful thrombectomy of RUE AVG.  90% stenosis at venous anastomosis treated with angioplasty.    Plan: Ready for use    Sedation:Moderate sedation    Estimated Blood Loss: less than 5 mL  Complications: None    See detailed procedure note with images in PACS [facility]).    The patient tolerated the procedure well without incident or complication.    Interventional Radiologist: Elsworth Soho, MD, MD  Date/Time: 12/19/2023 3:54 PM

## 2023-12-19 NOTE — Unmapped (Signed)
 Assessment/Plan:    Megan Robertson is a 53 y.o. female who will undergo fistulogram and intervention in Interventional Radiology.    --This procedure has been fully reviewed with the patient/patient???s authorized representative. The risks, benefits and alternatives have been explained, and the patient/patient???s authorized representative has consented to the procedure.  --The patient will accept blood products in an emergent situation.  --The patient does not have a Do Not Resuscitate order in effect.    HPI: Megan Robertson is a 53 y.o. female with ESRD and a RUE brachioaxillary AVG placed on 08/15/23. Patient was using successfully until this morning when they were unable to use the graft for dialysis. Last intervention on the graft was on 11/05/23 in which the venous anastamosis stenosis was angioplastied with a 7 mm Conquest.  Physical exam without palpable pulse or thrill in the graft.       Allergies:   Allergies   Allergen Reactions    Cephalexin Itching     unknown; tolerates cefepime    Mango Itching    Peach (Prunus Persica) Itching    Wellbutrin [Bupropion Hcl] Other (See Comments)     Seizure    Egg Itching    Fish Containing Products Itching    Iodinated Contrast Media Itching    Lactase-Rennet Nausea And Vomiting    Lexiscan [Regadenoson] Nausea And Vomiting     Patient had nausea after the Lexi then vomitted [id number]    Mushroom Itching    Vancomycin Analogues Vancomycin Infusion Reaction     Red peeling rash likely hypersensitivity        Medications:  No relevant medications, please see full medication list in Epic.    ASA Grade: ASA 2 - Patient with mild systemic disease with no functional limitations    PSH:   Past Surgical History:   Procedure Laterality Date    APPENDECTOMY      BREAST EXCISIONAL BIOPSY Right 11/28/2016    CHEMOTHERAPY      for ovarian CA in 2000    CHG US GUIDE, VASCULAR ACCESS N/A 01/29/2020    Procedure: ULTRASOUND GUIDANCE FOR VASC ACCESS REQUIRING [place] EVAL OF POTENTIAL ACCESS SITES;  Surgeon: Leona Carry, MD;  Location: MAIN OR [facility];  Service: Transplant    CHG US GUIDE, VASCULAR ACCESS N/A 06/08/2020    Procedure: ULTRASOUND GUIDANCE FOR VASC ACCESS REQUIRING [place] EVAL OF POTENTIAL ACCESS SITES;  Surgeon: Leona Carry, MD;  Location: MAIN OR [facility];  Service: Transplant    CHOLECYSTECTOMY      COMBINED HYSTEROSCOPY DIAGNOSTIC / D&C  07/07/2015    Planned for endometrial ablation, but patient had uterine perforation after D&C and unable to perform    HYSTERECTOMY      LEFT OOPHORECTOMY Left 04/12/2000    Removed with L fallopian tube for ectopic pregnancy    OOPHORECTOMY      PR ANASTOMOSIS,AV,ANY SITE Right 08/15/2023    Procedure: ARTERIOVENOUS ANASTOMOSIS, OPEN; DIRECT, ANY SITE, UPPER EXTREMITY;  Surgeon: Dyann Ruddle, MD;  Location: [facility] OR [facility];  Service: General Surgery    PR COLONOSCOPY W/BIOPSY SINGLE/MULTIPLE  09/12/2021    Procedure: COLONOSCOPY, FLEXIBLE, PROXIMAL TO SPLENIC FLEXURE; WITH BIOPSY, SINGLE OR MULTIPLE;  Surgeon: Maris Berger, MD;  Location: GI [facility]URES [facility];  Service: Gastroenterology    PR COLSC FLX W/RMVL OF TUMOR POLYP LESION SNARE TQ N/A 09/12/2021    Procedure: COLONOSCOPY FLEX; W/REMOV TUMOR/LES BY SNARE;  Surgeon: Maris Berger, MD;  Location: GI PROCEDURES [facility] [place];  Service: Gastroenterology    PR CREAT AV FISTULA,AUTOGENOUS GRAFT Left 01/29/2020    Procedure: Create Av Fistula (Separt Proc); Autog Gft;  Surgeon: Leona Carry, MD;  Location: MAIN OR [facility];  Service: Transplant    PR CREAT AV FISTULA,NON-AUTOGENOUS GRAFT Left 06/08/2020    Procedure: CREATE AV FISTULA (SEPARATE PROC); NONAUTOGENOUS GRAFT (EG, BIOLOGICAL COLLAGEN, THERMOPLASTIC GRAFT);  Surgeon: Leona Carry, MD;  Location: MAIN OR [facility];  Service: Transplant    PR EXPLORATION N/FLWD SURG UPPER EXTREMITY ARTERY Left 08/02/2020    Procedure: Exploration Not Followed By Surgical Repair, Artery; Upper Extremity (Eg, Axillary, Brachial, Radial, Ulnar);  Surgeon: Earney Mallet, MD;  Location: MAIN OR [facility];  Service: Vascular    PR INCIS/DRAIN ARM,DEEP ABSC/HEMATOMA Left 08/14/2020    Procedure: INCISION AND DRAINAGE, UPPER ARM OR ELBOW AREA; DEEP ABSCESS OR HEMATOMA;  Surgeon: Earney Mallet, MD;  Location: MAIN OR [place];  Service: Vascular    PR INSERTION TUNNEL INTRAPERITONEAL CATH DIAL OPEN Midline 02/01/2021    Procedure: INSERTION OF INTRAPERITONEAL CANNULA OR CATHETER FOR DRAINAGE OR DIALYSIS; PERMANENT;  Surgeon: Loney Hering, MD;  Location: MAIN OR [place];  Service: Transplant    PR REMV ART CLOT AXILL-BRACH,ARM INCIS Left 08/02/2020    Procedure: Embolect/Thrombec; Axilry/Brachial Art-Arm Incs;  Surgeon: Earney Mallet, MD;  Location: MAIN OR [facility];  Service: Vascular    PR UPPER GI ENDOSCOPY,BIOPSY N/A 08/29/2017    Procedure: UGI ENDOSCOPY; WITH BIOPSY, SINGLE OR MULTIPLE;  Surgeon: Neysa Hotter, MD;  Location: GI PROCEDURES [facility] [facility];  Service: Gastroenterology    PR VEIN BYPASS GRAFT,SUBCL-BRACHIAL Left 08/05/2020    Procedure: Bypass Graft, With Vein; Subclavian-Brachial;  Surgeon: Earney Mallet, MD;  Location: MAIN OR [facility];  Service: Vascular    SALPINGECTOMY Left 04/12/2000    Ectopic pregnancy    SALPINGECTOMY Right 11/22/2015    at time of total vaginal hysterectomy    TONSILECTOMY, ADENOIDECTOMY, BILATERAL MYRINGOTOMY AND TUBES      TOTAL VAGINAL HYSTERECTOMY  11/22/2015    With R salpingectomy       PMH:   Past Medical History:   Diagnosis Date    Abnormal mammogram     Abnormal Pap smear of cervix     Allergic     Anemia     Anxiety     Arthritis     Asthma     Back pain     Cancer (CMS-HCC) Ovarian    Depression     Eczema     ESRD (end stage renal disease) on dialysis (CMS-HCC)     FSGS (focal segmental glomerulosclerosis) 1997    renal biopsy at [facility]    GERD (gastroesophageal reflux disease)     Gout     H/O adenoidectomy     had adenoids removed Hypercholesteremia     Hypertension     Keloid     Ovarian cancer (CMS-HCC)     Pancreatitis     Psoriasis     QT prolongation 06/04/2023    Seizure (CMS-HCC)     unknown etiology; none for several years    Sepsis (CMS-HCC) 09/16/2022    Stroke (CMS-HCC) 2000    Tachycardia 07/31/2020       PE:    There were no vitals filed for this visit.  General: WD, WN female in NAD.   HEENT: Normocephalic, atraumatic.   Lungs: Respirations nonlabored  Mallampati Class:  Class III        Jake Bathe, MD, MD  12/19/2023, 2:34 PM

## 2023-12-19 NOTE — Unmapped (Addendum)
 I received a message that Weldon Picking RN with Home Therapy Department called about an urgent matter.  I called her back and she stated that today is the first day with home dialysis and Ms.Gutt's graft has no thrills or bruit and she is unable to dialyze.  I recommended that Ms. Sem come to the ER for evaluation.  I was able to confirm that she has an appointment with Dr Coralee Rud on 12-25-2023.  I will notify Nolen Mu NP.       Addendum 12/19/23 1131 am:  I called and spoke with Zella Ball again and let her know that, we are managing her acute limb ischemia, not her dialysis access.  That it was created by Dr. Darlin Drop.  I told her that Nolen Mu NP does recommend  that Ms. Neuharth go to the ED for evaluation.  Zella Ball stated that she would send Ms. Monreal to the ER and then have the Nephrologist handle getting her in to to the correct provider.

## 2023-12-23 NOTE — Unmapped (Signed)
 Pt unavailable, unable to LVM

## 2023-12-25 ENCOUNTER — Ambulatory Visit: Admit: 2023-12-25 | Discharge: 2023-12-26 | Payer: MEDICARE | Attending: Vascular Surgery | Primary: Vascular Surgery

## 2023-12-25 DIAGNOSIS — I998 Other disorder of circulatory system: Principal | ICD-10-CM

## 2023-12-25 NOTE — Unmapped (Signed)
 VASCULAR SURGERY CLINIC  FOLLOW UP NOTE    Patient Name: Megan Robertson  Patient Age: 53 y.o.  Encounter Date: 12/25/2023    OBJECTIVE:  BP 116/76 (BP Site: L Arm)  - Pulse 105  - Temp 36.6 ??C (97.8 ??F)  - Ht 157.5 cm (5' 2)  - Wt 79.8 kg (175 lb 14.4 oz)  - LMP  (LMP Unknown)  - BMI 32.17 kg/m??     PHYSICAL EXAMINATION:   General Appearance: no acute distress. Alert and oriented x 3.   Pulmonary: Normal respiratory effort.   Cardiovascular: Regular rate.  Neurologic:  Decreased grip strength L compared to R    HPI/ASSESSMENT:   [name] is a 53 y.o. female with ESRD on HD via R brachio-axillary AVG who presented in October 2021 with acute on chronic LUE limb threatening ischemia     01/29/20: L brachio-cephalic vein AVF (thrombosed before maturation, never used)  06/08/20: L brachio-basilic AVG (never used)  08/02/20: Explanation of L brachio-basilic AVG  08/05/20: L subclavian to distal brachial bypass with R GSV  08/14/20: LUE washout with debridement and wound vac   01/25/21: Patent bypass on duplex   02/09/21: LUE angiogram with PTA of the distal anastomosis of the left subclavian to brachial bypass and PTA of the left brachial artery     Last seen in clinic 11/21/2022. At that time, she was having trouble with her grip and would drop objects. Her symptoms are unchanged, and she is still having trouble with her grip. She is taking her Eliquis.   LUE arterial duplex 12/19/23 demonstrates the bypass is patent.    PLAN:   Follow-up in 1 year with LUE arterial duplex.   She should continue Eliquis.

## 2024-01-07 NOTE — Unmapped (Signed)
 Specialty Medication(s): Cosentyx    [name] has been dis-enrolled from the [employer] [facility] [employer] specialty pharmacy services as a result of multiple unsuccessful outreach attempts by the pharmacy.    Additional information provided to the patient: see [name]'s note from 12/26 for attempts.     [name] [name] Specialty Pharmacist

## 2024-01-08 DIAGNOSIS — Z91041 Radiographic dye allergy status: Principal | ICD-10-CM

## 2024-01-08 DIAGNOSIS — T8249XA Other complication of vascular dialysis catheter, initial encounter: Principal | ICD-10-CM

## 2024-01-08 MED ORDER — PREDNISONE 20 MG TABLET
ORAL_TABLET | 0 refills | 0.00 days | Status: CP
Start: 2024-01-08 — End: ?

## 2024-01-08 NOTE — Unmapped (Signed)
 VIR pre procedure prep call completed. Reviewed to arrive 1 hour prior to appointment time.  NPO guidelines reviewed for no food after midnight the night before the procedure 1000. Pt OK to take sips of clear liquids with all AM meds day of appointment until 0800. COVID screening completed. Pt/family aware of need for driver >09 years of age. Pt/family verbalized understanding and aware of our address. All questions answered.

## 2024-01-09 ENCOUNTER — Inpatient Hospital Stay: Admit: 2024-01-09 | Discharge: 2024-01-10 | Payer: MEDICARE

## 2024-01-09 MED ADMIN — fentaNYL (PF) (SUBLIMAZE) injection: INTRAVENOUS | @ 16:00:00 | Stop: 2024-01-09

## 2024-01-09 MED ADMIN — heparin (porcine) 1000 unit/mL injection: INTRAVENOUS | @ 16:00:00 | Stop: 2024-01-09

## 2024-01-09 MED ADMIN — lidocaine (XYLOCAINE) 10 mg/mL (1 %) injection: SUBCUTANEOUS | @ 16:00:00 | Stop: 2024-01-09

## 2024-01-09 MED ADMIN — midazolam (PF) (VERSED) 1 mg/mL injection: INTRAVENOUS | @ 16:00:00 | Stop: 2024-01-09

## 2024-01-09 MED ADMIN — iohexol (OMNIPAQUE) 300 mg iodine/mL solution 55 mL: 55 mL | @ 17:00:00 | Stop: 2024-01-09

## 2024-01-09 MED ADMIN — alteplase (ACTIVase) injection small catheter clearance: INTRAVENOUS | @ 16:00:00 | Stop: 2024-01-09

## 2024-01-09 NOTE — Unmapped (Signed)
 [place] INTERVENTIONAL NEPHROLOGY- Pre Procedure H/P  Patient name: Megan Robertson  CSN: [id number]  MRN: [id number]  Date of Procedure: 01/09/2024    Assessment/Plan:    Megan Robertson is a 53 y.o. female who will undergo thrombectomy of right upper arm brachiobasilic AV graft with possible angioplasty and/or stent placement versus new stick tunneled dialysis catheter placement under moderate sedation in interventional nephrology at [facility] endovascular center.     Consent obtained in the Pre Op holding area by Dr. Tiffany Kocher.  Risks, benefits, and alternatives including but not limited to risks like bleeding, blood clots, infection, embolism, thromboembolism, pain, vascular injury were discussed with patient/patient's representative. All questions were answered to patient/patient's representative satisfaction.  Patient/Patient's representative consents and would like to proceed with the procedure.   --The patient will accept blood products in an emergent situation.  --The patient does not have a Do Not Resuscitate order in effect.      HPI: Megan Robertson is a 53 y.o. female with ESRD on intermittent hemodialysis via a right upper arm brachiobasilic AV graft comes in with a thrombosed AV graft for possible thrombectomy versus new tunneled dialysis catheter placement.  Patient notes that last dialysis session was on Tuesday, 01/07/2024.    Past Medical History:   Diagnosis Date    Abnormal mammogram     Abnormal Pap smear of cervix     Allergic     Anemia     Anxiety     Arthritis     Asthma     Back pain     Cancer (CMS-HCC) Ovarian    Depression     Eczema     ESRD (end stage renal disease) on dialysis (CMS-HCC)     FSGS (focal segmental glomerulosclerosis) 1997    renal biopsy at [facility]    GERD (gastroesophageal reflux disease)     Gout     H/O adenoidectomy     had adenoids removed    Hypercholesteremia     Hypertension     Keloid     Ovarian cancer (CMS-HCC)     Pancreatitis     Psoriasis     QT prolongation 06/04/2023 Seizure (CMS-HCC)     unknown etiology; none for several years    Sepsis (CMS-HCC) 09/16/2022    Stroke (CMS-HCC) 2000    Tachycardia 07/31/2020       Past Surgical History:   Procedure Laterality Date    APPENDECTOMY      BREAST EXCISIONAL BIOPSY Right 11/28/2016    CHEMOTHERAPY      for ovarian CA in 2000    CHG US GUIDE, VASCULAR ACCESS N/A 01/29/2020    Procedure: ULTRASOUND GUIDANCE FOR VASC ACCESS REQUIRING [place] EVAL OF POTENTIAL ACCESS SITES;  Surgeon: Leona Carry, MD;  Location: MAIN OR [facility];  Service: Transplant    CHG US GUIDE, VASCULAR ACCESS N/A 06/08/2020    Procedure: ULTRASOUND GUIDANCE FOR VASC ACCESS REQUIRING [place] EVAL OF POTENTIAL ACCESS SITES;  Surgeon: Leona Carry, MD;  Location: MAIN OR [facility];  Service: Transplant    CHOLECYSTECTOMY      COMBINED HYSTEROSCOPY DIAGNOSTIC / D&C  07/07/2015    Planned for endometrial ablation, but patient had uterine perforation after D&C and unable to perform    HYSTERECTOMY      LEFT OOPHORECTOMY Left 04/12/2000    Removed with L fallopian tube for ectopic pregnancy    OOPHORECTOMY      PR ANASTOMOSIS,AV,ANY SITE Right 08/15/2023    Procedure: ARTERIOVENOUS  ANASTOMOSIS, OPEN; DIRECT, ANY SITE, UPPER EXTREMITY;  Surgeon: Dyann Ruddle, MD;  Location: [facility] OR [facility];  Service: General Surgery    PR COLONOSCOPY W/BIOPSY SINGLE/MULTIPLE  09/12/2021    Procedure: COLONOSCOPY, FLEXIBLE, PROXIMAL TO SPLENIC FLEXURE; WITH BIOPSY, SINGLE OR MULTIPLE;  Surgeon: Maris Berger, MD;  Location: [facility]URES [facility];  Service: Gastroenterology    PR COLSC FLX W/RMVL OF TUMOR POLYP LESION SNARE TQ N/A 09/12/2021    Procedure: COLONOSCOPY FLEX; W/REMOV TUMOR/LES BY SNARE;  Surgeon: Maris Berger, MD;  [facility]: [facility]URES [facility]  Service: Gastroenterology    PR CREAT AV FISTULA,AUTOGENOUS GRAFT Left 01/29/2020    Procedure: Create Av Fistula (Separt Proc); Autog Gft;  Surgeon: Leona Carry, MD;  Location: MAIN OR [facility]; Service: Transplant    PR CREAT AV FISTULA,NON-AUTOGENOUS GRAFT Left 06/08/2020    Procedure: CREATE AV FISTULA (SEPARATE PROC); NONAUTOGENOUS GRAFT (EG, BIOLOGICAL COLLAGEN, THERMOPLASTIC GRAFT);  Surgeon: Leona Carry, MD;  Location: MAIN OR [facility];  Service: Transplant    PR EXPLORATION N/FLWD SURG UPPER EXTREMITY ARTERY Left 08/02/2020    Procedure: Exploration Not Followed By Surgical Repair, Artery; Upper Extremity (Eg, Axillary, Brachial, Radial, Ulnar);  Surgeon: Earney Mallet, MD;  Location: MAIN OR [facility];  Service: Vascular    PR INCIS/DRAIN ARM,DEEP ABSC/HEMATOMA Left 08/14/2020    Procedure: INCISION AND DRAINAGE, UPPER ARM OR ELBOW AREA; DEEP ABSCESS OR HEMATOMA;  Surgeon: Earney Mallet, MD;  Location: MAIN OR [place];  Service: Vascular    PR INSERTION TUNNEL INTRAPERITONEAL CATH DIAL OPEN Midline 02/01/2021    Procedure: INSERTION OF INTRAPERITONEAL CANNULA OR CATHETER FOR DRAINAGE OR DIALYSIS; PERMANENT;  Surgeon: Loney Hering, MD;  Location: MAIN OR [place];  Service: Transplant    PR REMV ART CLOT AXILL-BRACH,ARM INCIS Left 08/02/2020    Procedure: Embolect/Thrombec; Axilry/Brachial Art-Arm Incs;  Surgeon: Earney Mallet, MD;  Location: MAIN OR [facility];  Service: Vascular    PR UPPER GI ENDOSCOPY,BIOPSY N/A 08/29/2017    Procedure: UGI ENDOSCOPY; WITH BIOPSY, SINGLE OR MULTIPLE;  Surgeon: Neysa Hotter, MD;  Location: [facility] PROCEDURES [place] [facility]  Service: Gastroenterology    PR VEIN BYPASS GRAFT,SUBCL-BRACHIAL Left 08/05/2020    Procedure: Bypass Graft, With Vein; Subclavian-Brachial;  Surgeon: Earney Mallet, MD;  Location: MAIN OR [facility];  Service: Vascular    SALPINGECTOMY Left 04/12/2000    Ectopic pregnancy    SALPINGECTOMY Right 11/22/2015    at time of total vaginal hysterectomy    TONSILECTOMY, ADENOIDECTOMY, BILATERAL MYRINGOTOMY AND TUBES      TOTAL VAGINAL HYSTERECTOMY  11/22/2015    With R salpingectomy        Allergies:   Allergies Allergen Reactions    Cephalexin Itching     unknown; tolerates cefepime    Mango Itching    Peach (Prunus Persica) Itching    Wellbutrin [Bupropion Hcl] Other (See Comments)     Seizure    Egg Itching    Fish Containing Products Itching    Iodinated Contrast Media Itching    Lactase-Rennet Nausea And Vomiting    Lexiscan [Regadenoson] Nausea And Vomiting     Patient had nausea after the Lexi then vomitted [id number]    Mushroom Itching    Vancomycin Analogues Vancomycin Infusion Reaction     Red peeling rash likely hypersensitivity        Medications:  No relevant medications, please see full medication list in Epic.    ASA Grade: ASA 3 - Patient with moderate systemic  disease with functional limitations    PE:    There were no vitals filed for this visit.  General: female in NAD.  Airway assessment: Class 3 - Can visualize soft palate  Lungs: Respirations nonlabored

## 2024-01-09 NOTE — Unmapped (Signed)
 Pt alert and oriented, resps even and unlabored. Spouse now at bedside. Pt provided with snack and drink.

## 2024-01-09 NOTE — Unmapped (Signed)
 [place] INTERVENTIONAL NEPHROLOGY - Operative Note     Post-Procedure Note    Procedure Name: Thrombectomy of right upper arm brachiobasilic AV graft and stent placement at the venous anastomosis stenosis.    Pre-Op Diagnosis: ESRD with thrombosed AV graft.    Post-Op Diagnosis: Same as pre-operative diagnosis    Attending Physician:  Gaylyn Lambert, MD    Time out: Prior to the procedure, a time out was performed with all team members present. During the time out, the patient, procedure and procedure site when applicable were verbally verified by the team members and Gaylyn Lambert, MD.    Description of procedure: Successful pharmacomechanical thrombectomy of right upper arm brachiobasilic AV graft with angioplasty of the venous anastomosis stenosis with an 8 mm x 40 mm conquest balloon. Successful placement of a covered stent graft 7 mm x 40 mm at the venous anastomosis stenosis.    Plan:  The graft can be used for dialysis immediately.  1. Okay to remove sheath suture at next dialysis treatment at the dialysis unit.  2. Monitor access flows and venous pressures during dialysis.    Sedation:Moderate sedation    Estimated Blood Loss: approximately <20 mL  Complications: None    See detailed procedure note with images in PACS [facility]).    The patient tolerated the procedure well without incident or complication and left the room in stable condition.    Gaylyn Lambert, MD  01/09/2024 1:05 PM

## 2024-01-13 NOTE — Unmapped (Signed)
 Post call complete, no questions or concerns. Call back number given should they arise.

## 2024-01-14 ENCOUNTER — Ambulatory Visit: Admit: 2024-01-14 | Discharge: 2024-01-15 | Payer: MEDICARE

## 2024-01-14 DIAGNOSIS — N186 End stage renal disease: Principal | ICD-10-CM

## 2024-01-14 DIAGNOSIS — I742 Embolism and thrombosis of arteries of the upper extremities: Principal | ICD-10-CM

## 2024-01-14 DIAGNOSIS — Z114 Encounter for screening for human immunodeficiency virus [HIV]: Principal | ICD-10-CM

## 2024-01-14 DIAGNOSIS — Z91041 Radiographic dye allergy status: Principal | ICD-10-CM

## 2024-01-14 DIAGNOSIS — E212 Other hyperparathyroidism: Principal | ICD-10-CM

## 2024-01-14 DIAGNOSIS — Z7289 Other problems related to lifestyle: Principal | ICD-10-CM

## 2024-01-14 LAB — BASIC METABOLIC PANEL
ANION GAP: 12 mmol/L (ref 5–14)
BLOOD UREA NITROGEN: 42 mg/dL — ABNORMAL HIGH (ref 9–23)
BUN / CREAT RATIO: 7
CALCIUM: 9.2 mg/dL (ref 8.7–10.4)
CHLORIDE: 99 mmol/L (ref 98–107)
CO2: 29.4 mmol/L (ref 20.0–31.0)
CREATININE: 6.39 mg/dL — ABNORMAL HIGH (ref 0.55–1.02)
EGFR CKD-EPI (2021) FEMALE: 7 mL/min/{1.73_m2} — ABNORMAL LOW (ref >=60)
GLUCOSE RANDOM: 106 mg/dL (ref 70–179)
POTASSIUM: 3.4 mmol/L (ref 3.4–4.8)
SODIUM: 140 mmol/L (ref 135–145)

## 2024-01-14 LAB — MAGNESIUM: MAGNESIUM: 2.2 mg/dL (ref 1.6–2.6)

## 2024-01-14 LAB — PARATHYROID HORMONE (PTH): PARATHYROID HORMONE INTACT: 1385.7 pg/mL — ABNORMAL HIGH (ref 18.4–80.1)

## 2024-01-14 LAB — ALBUMIN: ALBUMIN: 3.3 g/dL — ABNORMAL LOW (ref 3.4–5.0)

## 2024-01-14 MED ORDER — PREDNISONE 20 MG TABLET
ORAL_TABLET | 0 refills | 0 days | Status: CP
Start: 2024-01-14 — End: ?

## 2024-01-14 NOTE — Unmapped (Signed)
 Megan Robertson , a patient of [name], [name], MD, was seen in conjunction with the resident, Dr. Allena Katz.  Please see his note for other details.   Briefly, the patient is referred to me for treatment of medically refractory secondary hyperparathyroidism.  She also has a history of a left thyroid nodule.  Biopsy showed atypia of undetermined significance but the ThyroSeq assay was negative.    Ultrasound:    An ultrasound exam was performed in clinic to look for abnormal parathyroids.  On the right, a parathyroid candidate is seen posterior and lateral to the thyroid.  This probably represents a superior parathyroid.  No other clear parathyroid lesions are seen.  On the left there is a solid thyroid nodule measuring 3.1 cm transversely.  Of note she has very limited neck extension.    Impression: Secondary hyperparathyroidism due to chronic renal failure.  Large left thyroid nodule.    Plan: I recommended parathyroid exploration. I also recommended concurrent left thyroid lobectomy due to the large size of the lesion.  The lesion is large enough that it could impair parathyroid exploration plus it would be best to avoid any chance of reoperation in the neck in this difficult patient.  Surgery is tentatively scheduled for Wednesday, July 2.  She can continue her Monday Tuesday Thursday Friday dialysis schedule.  I instructed her to stop her Eliquis on Sunday prior to surgery.  Today we discussed risks benefits and alternatives with the patient and gave her printed information regarding the risks of surgery. She understands all of this and desires to proceed as outlined.

## 2024-01-14 NOTE — Unmapped (Signed)
 This procedure has been fully reviewed with the patient and written informed consent has been obtained and scanned into chart.

## 2024-01-14 NOTE — Unmapped (Signed)
 REFERRING PHYSICIAN:  Sharlett Iles, MD  [address]   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 The patient has had an elevated PTH since 2021 (most recently trending above 1000: [phone]). Recently underwent biopsy of left-sided thyroid nodule (09/06/2023: Bethesda III with follow-up ThyroSeq negative). Last TTE (07/10/2023): LVEF 60-65% with normal RV systolic function. We discussed the risks/benefits of surgery for secondary hyperparathyroidism. We discussed plans for removal of nearly all parathyroid glands with preservation of a small portion. In the event that a small portion of the parathyroid gland cannot be preserved with intact vascularity in the neck, we would proceed with total parathyroidectomy with autotransplantation of parathyroid tissue in the arm. Ultrasound performed in clinic today was notable for a large left-sided thyroid nodule. We informed the patient that it may be necessary to remove the thyroid nodule to facilitate parathyroid exploration and excision. The patient elected to proceed with surgery. Informed consent was obtained.     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 The patient has had an elevated PTH since 2021 (most recently trending above 1000: [phone]). Recently underwent biopsy of left-sided thyroid nodule (09/06/2023: Bethesda III with follow-up ThyroSeq negative). Endorses fatigue, mood disturbances, and cognitive deficits. Denies joint pains, fractures, constipation, abdominal pain, or nephrolithiasis. Denies neck surgery. Denies family history of parathyroid or thyroid masses.       Allergies    Allergies   Allergen Reactions    Cephalexin Itching     unknown; tolerates cefepime    Mango Itching    Peach (Prunus Persica) Itching    Wellbutrin [Bupropion Hcl] Other (See Comments)     Seizure    Egg Itching    Fish Containing Products Itching    Iodinated Contrast Media Itching    Lactase-Rennet Nausea And Vomiting    Lexiscan [Regadenoson] Nausea And Vomiting     Patient had nausea after the Lexi then vomitted [id number]    Mushroom Itching    Vancomycin Analogues Vancomycin Infusion Reaction     Red peeling rash likely hypersensitivity          Medications        Current Outpatient Medications:     albuterol HFA 90 mcg/actuation inhaler, Inhale 2 puffs every six (6) hours as needed for wheezing., Disp: 8 g, Rfl:  3    apixaban (ELIQUIS) 2.5 mg Tab, Take 1 tablet (2.5 mg total) by mouth two (2) times a day., Disp: 30 tablet, Rfl: 2    aspirin 81 MG chewable tablet, Chew 1 tablet (81 mg total) daily., Disp: 30 tablet, Rfl: 1    beclomethasone dipropionate (QVAR REDIHALER) 80 mcg/actuation inhaler, Inhale 1 puff  in the morning and 1 puff in the evening., Disp: 10.6 g, Rfl: 3    calcitriol (ROCALTROL) 0.5 MCG capsule, Take 4 capsules (2 mcg total) by mouth 3 (three) times a week., Disp: , Rfl:     cinacalcet (SENSIPAR) 90 MG tablet, take 1 tablet by mouth every day  with meals, Disp: , Rfl:     ferric citrate (AURYXIA) 210 mg iron Tab tablet, Take 1 tablet (210 mg total) by mouth Three (3) times a day with a meal., Disp: , Rfl:     furosemide (LASIX) 40 MG tablet, Take 1 tablet up to 3 times weekly as needed for leg swelling., Disp: 12 tablet, Rfl: 2    gentamicin (GARAMYCIN) 0.1 % ointment, Apply 1 Application topically daily. Apply to exit site daily with dressing change, Disp: , Rfl:     methoxy peg-epoetin beta (MIRCERA INJ), Infuse 150 mcg into a venous catheter every fourteen (14) days., Disp: , Rfl:     mirtazapine (REMERON) 15 MG tablet, Take 1 tablet (15 mg total) by mouth nightly., Disp: 30 tablet, Rfl: 0    nystatin (MYCOSTATIN) 100,000 unit/gram powder, Apply under breasts two times daily until resolution, Disp: 60 g, Rfl: 1    predniSONE (DELTASONE) 20 MG tablet, Take 3 pills (60mg ) 13 hours before your vascular procedure / Take 3 pills (60mg ) 7 hours before your vascular procedure / Take 3 pills (60mg ) 1 hour before your vascular procedure, Disp: 9 tablet, Rfl: 0    secukinumab (COSENTYX PEN, 2 PENS,) 150 mg/mL PnIj injection, Inject the contents of 2 pens (300 mg total) under the skin every twenty-eight (28) days. Maintenance dose., Disp: 6 mL, Rfl: 3    sevelamer (RENVELA) 800 mg tablet, Take 3 tablets (2,400 mg total) by mouth Three (3) times a day with a meal., Disp: 270 tablet, Rfl: 11    tizanidine (ZANAFLEX) 2 MG tablet, Take 1 tablet (2 mg total) by mouth daily as needed., Disp: 90 tablet, Rfl: 0    triamcinolone (KENALOG) 0.1 % ointment, Apply topically two (2) times a day as needed. Use on psoriasis until flat., Disp: 80 g, Rfl: 1      Past Medical History    Past Medical History:   Diagnosis Date    Abnormal mammogram     Abnormal Pap smear of cervix     Allergic     Anemia Anxiety     Arthritis     Asthma     Back pain     Cancer Ovarian    Depression     Eczema     ESRD (end stage renal disease) on dialysis (CMS-HCC)     FSGS (focal segmental glomerulosclerosis) 1997    renal biopsy at [facility]    GERD (gastroesophageal reflux disease)     Gout     H/O adenoidectomy     had adenoids removed    Hypercholesteremia     Hypertension     Keloid     Ovarian cancer     Pancreatitis     Psoriasis     QT prolongation 06/04/2023    Seizure  unknown etiology; none for several years    Sepsis (CMS-HCC) 09/16/2022    Stroke (CMS-HCC) 2000    Tachycardia 07/31/2020         Past Surgical History    Past Surgical History:   Procedure Laterality Date    APPENDECTOMY      BREAST EXCISIONAL BIOPSY Right 11/28/2016    CHEMOTHERAPY      for ovarian CA in 2000    CHG US GUIDE, VASCULAR ACCESS N/A 01/29/2020    Procedure: ULTRASOUND GUIDANCE FOR VASC ACCESS REQUIRING [place] EVAL OF POTENTIAL ACCESS SITES;  Surgeon: Leona Carry, MD;  Location: [facility]IN OR [facility];  Service: Transplant    CHG US GUIDE, VASCULAR ACCESS N/A 06/08/2020    Procedure: ULTRASOUND GUIDANCE FOR VASC ACCESS REQUIRING [place] EVAL OF POTENTIAL ACCESS SITES;  Surgeon: Leona Carry, MD;  Location: MAIN OR [facility];  Service: Transplant    CHOLECYSTECTOMY      COMBINED HYSTEROSCOPY DIAGNOSTIC / D&C  07/07/2015    Planned for endometrial ablation, but patient had uterine perforation after D&C and unable to perform    HYSTERECTOMY      LEFT OOPHORECTOMY Left 04/12/2000    Removed with L fallopian tube for ectopic pregnancy    OOPHORECTOMY      PR ANASTOMOSIS,AV,ANY SITE Right 08/15/2023    Procedure: ARTERIOVENOUS ANASTOMOSIS, OPEN; DIRECT, ANY SITE, UPPER EXTREMITY;  Surgeon: Dyann Ruddle, MD;  Location: [facility];  Service: General Surgery    PR COLONOSCOPY W/BIOPSY SINGLE/MULTIPLE  09/12/2021    Procedure: COLONOSCOPY, FLEXIBLE, PROXIMAL TO SPLENIC FLEXURE; WITH BIOPSY, SINGLE OR MULTIPLE;  Surgeon: Maris Berger, MD; Location: GI PROCEDURES [facility];  Service: Gastroenterology    PR COLSC FLX W/RMVL OF TUMOR POLYP LESION SNARE TQ N/A 09/12/2021    Procedure: COLONOSCOPY FLEX; W/REMOV TUMOR/LES BY SNARE;  Surgeon: Maris Berger, MD;  Location: GI PROC[facility]URES [facility];  Service: Gastroenterology    PR CREAT AV FISTULA,AUTOGENOUS GRAFT Left 01/29/2020    Procedure: Create Av Fistula (Separt Proc); Autog Gft;  Surgeon: Leona Carry, MD;  Location: MAIN OR [facility];  Service: Transplant    PR CREAT AV FISTULA,NON-AUTOGENOUS GRAFT Left 06/08/2020    Procedure: CREATE AV FISTULA (SEPARATE PROC); NONAUTOGENOUS GRAFT (EG, BIOLOGICAL COLLAGEN, THERMOPLASTIC GRAFT);  Surgeon: Leona Carry, MD;  Location: [facility];  Service: Transplant    PR EXPLORATION N/FLWD SURG UPPER EXTREMITY ARTERY Left 08/02/2020    Procedure: Exploration Not Followed By Surgical Repair, Artery; Upper Extremity (Eg, Axillary, Brachial, Radial, Ulnar);  Surgeon: Earney Mallet, MD;  Location: MAIN OR [facility];  Service: Vascular    PR INCIS/DRAIN ARM,DEEP ABSC/HEMATOMA Left 08/14/2020    Procedure: INCISION AND DRAINAGE, UPPER ARM OR ELBOW AREA; DEEP ABSCESS OR HEMATOMA;  Surgeon: Earney Mallet, MD;  Location: MAIN OR [place];  Service: Vascular    PR INSERTION TUNNEL INTRAPERITONEAL CATH DIAL OPEN Midline 02/01/2021    Procedure: INSERTION OF INTRAPERITONEAL CANNULA OR CATHETER FOR DRAINAGE OR DIALYSIS; PERMANENT;  Surgeon: Loney Hering, MD;  Location: MAIN OR [place];  Service: Transplant    PR REMV ART CLOT AXILL-BRACH,ARM INCIS Left 08/02/2020    Procedure: Embolect/Thrombec; Axilry/Brachial Art-Arm Incs;  Surgeon: Earney Mallet, MD;  Location: MAIN OR [facility];  Service: Vascular    PR UPPER GI ENDOSCOPY,BIOPSY N/A 08/29/2017    Procedure: UGI ENDOSCOPY; WITH BIOPSY, SINGLE OR MULTIPLE;  Surgeon: Neysa Hotter, MD;  Location: GI PROCEDURES [facility];  Service: Gastroenterology    PR  VEIN BYPASS GRAFT,SUBCL-BRACHIAL Left 08/05/2020    Procedure: Bypass Graft, With Vein; Subclavian-Brachial;  Surgeon: Earney Mallet, MD;  Location: MAIN OR [facility];  Service: Vascular    SALPINGECTOMY Left 04/12/2000    Ectopic pregnancy    SALPINGECTOMY Right 11/22/2015    at time of total vaginal hysterectomy    TONSILECTOMY, ADENOIDECTOMY, BILATERAL MYRINGOTOMY AND TUBES      TOTAL VAGINAL HYSTERECTOMY  11/22/2015    With R salpingectomy         Family History    Family History   Adopted: Yes   Problem Relation Age of Onset    Heart attack Mother 39    Alcohol abuse Mother     Mental illness Mother     No Known Problems Father     No Known Problems Sister     No Known Problems Daughter     No Known Problems Maternal Grandmother     No Known Problems Maternal Grandfather     No Known Problems Paternal Grandmother     No Known Problems Paternal Grandfather     No Known Problems Brother     No Known Problems Son     No Known Problems Maternal Aunt     No Known Problems Maternal Uncle     No Known Problems Paternal Aunt     No Known Problems Paternal Uncle     No Known Problems Other     Anesthesia problems Neg Hx     Broken bones Neg Hx     Cancer Neg Hx     Clotting disorder Neg Hx     Collagen disease Neg Hx     Diabetes Neg Hx     Dislocations Neg Hx     Fibromyalgia Neg Hx     Gout Neg Hx     Hemophilia Neg Hx     Osteoporosis Neg Hx     Rheumatologic disease Neg Hx     Scoliosis Neg Hx     Severe sprains Neg Hx     Sickle cell anemia Neg Hx     Spinal Compression Fracture Neg Hx     Melanoma Neg Hx     Basal cell carcinoma Neg Hx     Squamous cell carcinoma Neg Hx     Breast cancer Neg Hx        Social History:  Tobacco use:   Social History     Tobacco Use   Smoking Status Former    Current packs/day: 0.00    Average packs/day: 2.0 packs/day for 1 year (2.0 ttl pk-yrs)    Types: Cigarettes    Start date: 06/30/2009    Quit date: 06/30/2010    Years since quitting: 13.5   Smokeless Tobacco Never     Alcohol use: Social History     Substance and Sexual Activity   Alcohol Use Not Currently     Drug use:   Social History     Substance and Sexual Activity   Drug Use Not Currently         Objective      Vital Signs    Vitals:    01/14/24 1620   BP: 147/62   Pulse: 105   Resp: 20   Temp: 36.6 ??C (97.9 ??F)   SpO2: 100%       Physical Exam    General Appearance:  No acute distress. Hair and skin normal in texture and character for age.  Voice normal in  character.   Eyes:  Conjuctiva and lids appear normal.   There is no proptosis   Neck: Limited neck extension; no palpable neck masses   Pulmonary:    Normal respiratory effort.  Lungs clear to auscultation  bilaterally.   Cardiovascular:  Regular rate and rhythm, no murmur noted.     Extremities: Grossly normal neuromuscular function. Bilateral upper extremities with scars from prior dialysis access creation attempts.    Neurologic: Muscle strength within normal limits.  No visible tremor.    Psychiatric: Judgement and insight appropriate.  Oriented to person, place, and time. Normal affect.

## 2024-01-14 NOTE — Unmapped (Signed)
VIR pre procedure prep call completed. Reviewed to arrive 1 hour prior to appointment time- 0700. Informed of no show/late cancellation policy. NPO guidelines reviewed. Pt OK to take sips of clear liquids with all AM meds. Pt aware of need for driver >53 years of age able to stay throughout procedure and recovery. Pt verbalized understanding. All questions answered.

## 2024-01-15 ENCOUNTER — Inpatient Hospital Stay: Admit: 2024-01-15 | Discharge: 2024-01-16 | Payer: MEDICARE

## 2024-01-15 LAB — HIV ANTIGEN/ANTIBODY COMBO: HIV ANTIGEN/ANTIBODY COMBO: NONREACTIVE

## 2024-01-15 LAB — HEPATITIS C ANTIBODY: HEPATITIS C ANTIBODY: NONREACTIVE

## 2024-01-15 LAB — HEPATITIS B SURFACE ANTIBODY
HEPATITIS B SURFACE ANTIBODY QUANT: <8 m[IU]/mL (ref <8.00)
HEPATITIS B SURFACE ANTIBODY: NONREACTIVE

## 2024-01-15 LAB — SYPHILIS SCREEN: SYPHILIS RPR SCREEN: NONREACTIVE

## 2024-01-15 LAB — HEPATITIS B SURFACE ANTIGEN: HEPATITIS B SURFACE ANTIGEN: NONREACTIVE

## 2024-01-15 MED ADMIN — alteplase (ACTIVase) injection small catheter clearance: INTRAVENOUS | @ 12:00:00 | Stop: 2024-01-15

## 2024-01-15 MED ADMIN — methylPREDNISolone sodium succinate (PF) (SOLU-Medrol) injection: INTRAVENOUS | @ 12:00:00 | Stop: 2024-01-15

## 2024-01-15 MED ADMIN — heparin (porcine) 1000 unit/mL injection: INTRAVENOUS | @ 12:00:00 | Stop: 2024-01-15

## 2024-01-15 MED ADMIN — midazolam (PF) (VERSED) 1 mg/mL injection: INTRAVENOUS | @ 12:00:00 | Stop: 2024-01-15

## 2024-01-15 MED ADMIN — lidocaine (XYLOCAINE) 10 mg/mL (1 %) injection: SUBCUTANEOUS | @ 13:00:00 | Stop: 2024-01-15

## 2024-01-15 MED ADMIN — fentaNYL (PF) (SUBLIMAZE) injection: INTRAVENOUS | @ 12:00:00 | Stop: 2024-01-15

## 2024-01-15 MED ADMIN — diphenhydrAMINE (BENADRYL) injection: INTRAVENOUS | @ 12:00:00 | Stop: 2024-01-15

## 2024-01-15 MED ADMIN — alteplase (ACTIVase) injection small catheter clearance: INTRAVENOUS | @ 13:00:00 | Stop: 2024-01-15

## 2024-01-15 MED ADMIN — oxyCODONE (ROXICODONE) immediate release tablet 5 mg: 5 mg | ORAL | @ 14:00:00 | Stop: 2024-01-15

## 2024-01-15 MED ADMIN — lidocaine (XYLOCAINE) 10 mg/mL (1 %) injection: SUBCUTANEOUS | @ 12:00:00 | Stop: 2024-01-15

## 2024-01-15 MED ADMIN — heparin (porcine) 1000 unit/mL injection: INTRAVENOUS | @ 13:00:00 | Stop: 2024-01-15

## 2024-01-15 MED ADMIN — diphenhydrAMINE (BENADRYL) capsule 25 mg: 25 mg | ORAL | @ 11:00:00

## 2024-01-15 MED ADMIN — iohexol (OMNIPAQUE) 300 mg iodine/mL solution 454 mL: 454 mL | INTRAVENOUS | @ 13:00:00 | Stop: 2024-01-15

## 2024-01-15 NOTE — Unmapped (Signed)
 Patient has 2 woggles in places, can be removed at next dialysis session. Dressing is clean, dry, and intact. AVS reviewed with pt and pt husband. Vital signs stable and discharged in stable condition.     Pt requested I call dialysis unit to make them aware she will not be able to make it in for dialysis today. [facility] notified. Pt scheduled for dialysis tomorrow at 11am and verbalized understanding.

## 2024-01-15 NOTE — Unmapped (Signed)
 [place] INTERVENTIONAL RADIOLOGY - Pre Procedure H/P  Patient name: Megan Robertson  CSN: [id number]  MRN: [id number]  Date of Procedure:       Assessment/Plan:    Megan Robertson is a 53 y.o. female who will undergo right upper arm dialysis AVG declot in Interventional Radiology.    Consent obtained in the Pre Op holding area by Dr Cathie Hoops.  Risks, benefits, and alternatives including but not limited to bleeding, vessel injury were discussed with patient/patient's representative. All questions were answered to patient/patient's representative satisfaction.  Patient/Patient's representative consents and would like to proceed with the procedure.   --The patient will accept blood products in an emergent situation.  --The patient does not have a Do Not Resuscitate order in effect.      HPI: Megan Robertson is a 53 y.o. female with ESRD, on hemodialysis via right upper arm Brachial basilic AVG. Last successful dialysis was 2 days ago. It was found thrombosed yesterday. She had declot procedure last week. We will repeat the declot again today.    Past Medical History:   Diagnosis Date    Abnormal mammogram     Abnormal Pap smear of cervix     Allergic     Anemia     Anxiety     Arthritis     Asthma     Back pain     Cancer Ovarian    Depression     Eczema     ESRD (end stage renal disease) on dialysis     FSGS (focal segmental glomerulosclerosis) 1997    renal biopsy at [facility]    GERD (gastroesophageal reflux disease)     Gout     H/O adenoidectomy     had adenoids removed    Hypercholesteremia     Hypertension     Keloid     Ovarian cancer     Pancreatitis     Psoriasis     QT prolongation 06/04/2023    Seizure     unknown etiology; none for several years    Sepsis 09/16/2022    Stroke 2000    Tachycardia 07/31/2020       Past Surgical History:   Procedure Laterality Date    APPENDECTOMY      BREAST EXCISIONAL BIOPSY Right 11/28/2016    CHEMOTHERAPY      for ovarian CA in 2000    CHG US GUIDE, VASCULAR ACCESS N/A 01/29/2020 Procedure: ULTRASOUND GUIDANCE FOR VASC ACCESS REQUIRING [place] EVAL OF POTENTIAL ACCESS SITES;  Surgeon: Leona Carry, MD;  Location: MAIN OR [facility];  Service: Transplant    CHG US GUIDE, VASCULAR ACCESS N/A 06/08/2020    Procedure: ULTRASOUND GUIDANCE FOR VASC ACCESS REQUIRING [place] EVAL OF POTENTIAL ACCESS SITES;  Surgeon: Leona Carry, MD;  Location: MAIN OR [facility];  Service: Transplant    CHOLECYSTECTOMY      COMBINED HYSTEROSCOPY DIAGNOSTIC / D&C  07/07/2015    Planned for endometrial ablation, but patient had uterine perforation after D&C and unable to perform    HYSTERECTOMY      LEFT OOPHORECTOMY Left 04/12/2000    Removed with L fallopian tube for ectopic pregnancy    OOPHORECTOMY      PR ANASTOMOSIS,AV,ANY SITE Right 08/15/2023    Procedure: ARTERIOVENOUS ANASTOMOSIS, OPEN; DIRECT, ANY SITE, UPPER EXTREMITY;  Surgeon: Dyann Ruddle, MD;  Location: [facility];  Service: General Surgery    PR COLONOSCOPY W/BIOPSY SINGLE/MULTIPLE  09/12/2021    Procedure: COLONOSCOPY, FLEXIBLE, PROXIMAL  TO SPLENIC FLEXURE; WITH BIOPSY, SINGLE OR MULTIPLE;  Surgeon: Maris Berger, MD;  Location: GI [facility]URES [facility];  Service: Gastroenterology    PR COLSC FLX W/RMVL OF TUMOR POLYP LESION SNARE TQ N/A 09/12/2021    Procedure: COLONOSCOPY FLEX; W/REMOV TUMOR/LES BY SNARE;  Surgeon: Maris Berger, MD;  Location: GI PROCEDURES [facility];  Service: Gastroenterology    PR CREAT AV FISTULA,AUTOGENOUS GRAFT Left 01/29/2020    Procedure: Create Av Fistula (Separt Proc); Autog Gft;  Surgeon: Leona Carry, MD;  Location: MAIN OR [facility];  Service: Transplant    PR CREAT AV FISTULA,NON-AUTOGENOUS GRAFT Left 06/08/2020    Procedure: CREATE AV FISTULA (SEPARATE PROC); NONAUTOGENOUS GRAFT (EG, BIOLOGICAL COLLAGEN, THERMOPLASTIC GRAFT);  Surgeon: Leona Carry, MD;  Location: [facility] OR [facility];  Service: Transplant    PR EXPLORATION N/FLWD SURG UPPER EXTREMITY ARTERY Left 08/02/2020 Procedure: Exploration Not Followed By Surgical Repair, Artery; Upper Extremity (Eg, Axillary, Brachial, Radial, Ulnar);  Surgeon: Earney Mallet, MD;  Location: MAIN OR [facility];  Service: Vascular    PR INCIS/DRAIN ARM,DEEP ABSC/HEMATOMA Left 08/14/2020    Procedure: INCISION AND DRAINAGE, UPPER ARM OR ELBOW AREA; DEEP ABSCESS OR HEMATOMA;  Surgeon: Earney Mallet, MD;  Location: MAIN OR [place];  Service: Vascular    PR INSERTION TUNNEL INTRAPERITONEAL CATH DIAL OPEN Midline 02/01/2021    Procedure: INSERTION OF INTRAPERITONEAL CANNULA OR CATHETER FOR DRAINAGE OR DIALYSIS; PERMANENT;  Surgeon: Loney Hering, MD;  Location: MAIN OR [place];  Service: Transplant    PR REMV ART CLOT AXILL-BRACH,ARM INCIS Left 08/02/2020    Procedure: Embolect/Thrombec; Axilry/Brachial Art-Arm Incs;  Surgeon: Earney Mallet, MD;  Location: MAIN OR [facility];  Service: Vascular    PR UPPER GI ENDOSCOPY,BIOPSY N/A 08/29/2017    Procedure: UGI ENDOSCOPY; WITH BIOPSY, SINGLE OR MULTIPLE;  Surgeon: Neysa Hotter, MD;  Location: [facility] PROCEDURES [facility];  Service: Gastroenterology    PR VEIN BYPASS GRAFT,SUBCL-BRACHIAL Left 08/05/2020    Procedure: Bypass Graft, With Vein; Subclavian-Brachial;  Surgeon: Earney Mallet, MD;  Location: MAIN OR [facility];  Service: Vascular    SALPINGECTOMY Left 04/12/2000    Ectopic pregnancy    SALPINGECTOMY Right 11/22/2015    at time of total vaginal hysterectomy    TONSILECTOMY, ADENOIDECTOMY, BILATERAL MYRINGOTOMY AND TUBES      TOTAL VAGINAL HYSTERECTOMY  11/22/2015    With R salpingectomy        Allergies:   Allergies   Allergen Reactions    Cephalexin Itching     unknown; tolerates cefepime    Mango Itching    Peach (Prunus Persica) Itching    Wellbutrin [Bupropion Hcl] Other (See Comments)     Seizure    Egg Itching    Fish Containing Products Itching    Iodinated Contrast Media Itching    Lactase-Rennet Nausea And Vomiting    Lexiscan [Regadenoson] Nausea And Vomiting Patient had nausea after the Lexi then vomitted [id number]    Mushroom Itching    Vancomycin Analogues Vancomycin Infusion Reaction     Red peeling rash likely hypersensitivity        Medications:  No relevant medications, please see full medication list in Epic.    ASA Grade: ASA 2 - Patient with mild systemic disease with no functional limitations    PE:    There were no vitals filed for this visit.  General: female in NAD.  Airway assessment: Class 2 - Can visualize soft palate and fauces, tip of uvula is obscured  Lungs: Respirations nonlabored

## 2024-01-15 NOTE — Unmapped (Signed)
 [place] INTERVENTIONAL RADIOLOGY - Operative Note     VIR Post-Procedure Note    Procedure Name: Dialysis circuit study with balloon angioplasty and tPA    Pre-Op Diagnosis: Thrombosed RUE AVG    Post-Op Diagnosis: Same as pre-operative diagnosis    VIR Providers    Attending: Ammie Dalton, MD  Resident: Lillie Columbia, MD    Description of procedure:   Angiography demonstrated narrowing of the graft lumen and vasospasm of the arterial outflow. Successful thrombolysis with angioplasty of the brachiobasilic AV graft, arterial and venous anastomosis and outflow and inflow vessels. Total 4 mg of tPA administered, 2 mg mixed with saline given within the graft lumen to the level of the arterial anastomosis and 2 mg mixed with saline given at the arterial outflow.    Sedation: Moderate    Estimated Blood Loss: approximately <5 mL  Complications: None    See detailed procedure note with images in PACS [facility]).    The patient tolerated the procedure well without incident or complication and left the room in stable condition.    Plan:  - OK to remove woggle suture at next dialysis session    Lillie Columbia, MD  01/15/2024 9:03 AM       I was present for the entirety of the procedure(s). Ammie Dalton, MD

## 2024-01-16 DIAGNOSIS — E041 Nontoxic single thyroid nodule: Principal | ICD-10-CM

## 2024-01-16 NOTE — Unmapped (Signed)
 Post op call- Patient states her arm is still sore today but is better than yesterday and is taking tylenol. No other questions or concerns at this time. Advised to call back with any questions.

## 2024-01-17 LAB — HTLV I/II ANTIBODY: HTLV I/II ANTIBODIES: NEGATIVE

## 2024-01-29 ENCOUNTER — Ambulatory Visit: Admit: 2024-01-29 | Discharge: 2024-01-30 | Payer: Medicare (Managed Care)

## 2024-01-29 LAB — HEMOGLOBIN A1C
ESTIMATED AVERAGE GLUCOSE: 105 mg/dL
HEMOGLOBIN A1C: 5.3 % (ref 4.8–5.6)

## 2024-01-29 MED ORDER — ONDANSETRON 4 MG DISINTEGRATING TABLET
ORAL_TABLET | Freq: Three times a day (TID) | 1 refills | 30.00 days | Status: CP | PRN
Start: 2024-01-29 — End: 2024-04-28

## 2024-01-29 NOTE — Unmapped (Addendum)
 Hi Megan Robertson,    It was great to see you today. I look forward to seeing you at your next visit. In the meantime, for non-urgent questions, please message our clinic on MyChart. I am typically able to respond to these messages within 3-5 business days. For more urgent matters, please call our clinic at [phone]. In the event of a medical emergency, call 911 and/or visit our urgent care or an emergency room.     To schedule your Mammogram: Please call [phone]    Take care,    Elia Groom, MD  He, him, his  Resident Physician, PGY-2    [facility]   [address]   [place], [place] [address]   [phone] (Phone); [phone] (Fax)     [facility] MyChart is for non-urgent messages. I do my best to respond within 3 business days, however occasionally it may take longer. If you have immediate concerns, contact our clinic by phone [phone].      [facility] has an Urgent Care!  Family Medicine Urgent Care Hours: Monday-Friday 7am-8pm; Saturday and Sunday 12pm-5pm   If you think you are having an emergency, please call 911 or go to your nearest emergency department.

## 2024-01-29 NOTE — Unmapped (Signed)
 Has at home HD sessions 4 days a week. At previous outpatient dialysis she would get zofran for nausea. Will refill today.       Orders:    ondansetron (ZOFRAN-ODT) 4 MG disintegrating tablet; Dissolve 1 tablet (4 mg total) in the mouth every eight (8) hours as needed for nausea.

## 2024-01-29 NOTE — Unmapped (Addendum)
 A1c trend: 5.8 > 4.7 (2022). Will recheck today.   Orders:    Hemoglobin A1c; Future

## 2024-01-29 NOTE — Unmapped (Signed)
 Megan Robertson is a 53 y.o. female with a PMHx as stated below that presents to clinic today regarding the following issues:      Assessment & Plan  Healthcare maintenance  Reviewed care gaps with patient - mammograms and vaccines. Expressed understanding. Declines vaccines today.        Prediabetes  A1c trend: 5.8 > 4.7 (2022). Will recheck today.   Orders:    Hemoglobin A1c; Future    Ear drainage, bilateral  Bilaterally drainage ongoing for 5 years. No blood noted. Has congential right sided ear loss. Has noted left sided hearing loss. External and internal ear exam normal. Will make referral to audiology.   Orders:    Ambulatory referral to Audiology; Future    ESRD (end stage renal disease) on dialysis  Has at home HD sessions 4 days a week. At previous outpatient dialysis she would get zofran for nausea. Will refill today.       Orders:    ondansetron (ZOFRAN-ODT) 4 MG disintegrating tablet; Dissolve 1 tablet (4 mg total) in the mouth every eight (8) hours as needed for nausea.    Nausea    Orders:    ondansetron (ZOFRAN-ODT) 4 MG disintegrating tablet; Dissolve 1 tablet (4 mg total) in the mouth every eight (8) hours as needed for nausea.    Encounter for screening mammogram for malignant neoplasm of breast    Orders:    Mammo Digital Screening W Tomo Bilateral; Future      Assessment & Plan      Future Appointments   Date Time Provider Department Center   03/06/2024  3:00 PM Katura Eatherly, Jorene New, MD [facility]   05/05/2024  3:30 PM Thomes Flicker, MD [facility]   08/26/2024 11:00 AM [facility] US  RM 1 [facility] [place] [facility]   12/23/2024 11:00 AM [facility] OUT[facility]IENT 1 [facility] [place]   12/23/2024 12:00 PM Bianca Bud, MD [facility]       Chief Complaint   Patient presents with    Follow-up     General         HISTORY:  I have reviewed the patients problem list, current medications, and allergies and have updated/reconciled them as needed.    Megan Robertson is a 53 y.o. female with a PMHx as stated below that presents to clinic today for the following issues:    # See problem based charting      Objective     Active Ambulatory Problems     Diagnosis Date Noted    Gout 05/28/2017    GERD (gastroesophageal reflux disease) 08/08/2011    Hypertension 06/21/2017    Ductal hyperplasia of breast 06/21/2017    PTSD (post-traumatic stress disorder) 12/26/2016    Prediabetes 09/23/2017    Mild persistent asthma without complication 04/11/2018    Dyspnea on exertion 05/08/2018    Stable angina (CMS-HCC) 09/11/2018    OSA (obstructive sleep apnea) 09/11/2018    Thrombocytopenia 07/31/2020    Anemia in chronic kidney disease 07/01/2020    Coagulation defect, unspecified 07/01/2020    Colitis 03/19/2012    ESRD (end stage renal disease) on dialysis 07/01/2020    Metabolic acidosis, NAG, bicarbonate losses 03/19/2012    Obesity 06/24/2014    Secondary hyperparathyroidism of renal origin 07/01/2020    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    QT prolongation 06/04/2023    Dysgeusia 06/23/2023    Cellulitis 10/02/2023    Itching 10/07/2023  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     Resolved Ambulatory Problems     Diagnosis Date Noted    Stage 4 chronic kidney disease 03/07/2017    Chronic diarrhea 11/20/2016    Affective disorder, psychotic (CMS-HCC) 12/26/2016    Bereavement 12/26/2016    Breast pain, right 09/23/2017    Nipple discharge 09/23/2017    History of ovarian cancer 11/13/2017    Elevated glucose 02/24/2018    Elevated systolic blood pressure reading without diagnosis of hypertension 02/24/2018    History of head injury 02/24/2018    Other chest pain 05/08/2018    Dyslipidemia 07/31/2018    Lichen sclerosus 11/26/2019    Vasomotor symptoms due to menopause 11/26/2019    Dyspareunia in female 11/26/2019    Leukocytosis 07/31/2020    Anemia 07/31/2020    Hypokalemia 07/31/2020    Tachycardia 07/31/2020    Anaphylactic shock, unspecified, initial encounter 07/01/2020    S/P vaginal hysterectomy 11/22/2015    Need for shingles vaccine 08/16/2021    High serum lactate 09/16/2022    Melena 09/16/2022    Coffee ground emesis 09/16/2022    Nausea 09/16/2022    Headache 09/16/2022    Blurry vision 09/16/2022    Sepsis 09/16/2022    Hypotension 06/03/2023    Hyperphosphatemia 06/08/2023    Constipation 06/08/2023    Poor fluid intake 06/23/2023    Hypovolemia 06/23/2023    Fatigue 06/23/2023    Dysuria 06/23/2023    Physical deconditioning 06/23/2023    Malnutrition 06/23/2023     Past Medical History:   Diagnosis Date    Abnormal mammogram     Abnormal Pap smear of cervix     Allergic     Anxiety     Arthritis     Asthma     Back pain     Cancer Ovarian    Depression     Eczema     FSGS (focal segmental glomerulosclerosis) 1997    H/O adenoidectomy     Hypercholesteremia     Keloid     Ovarian cancer     Pancreatitis     Psoriasis     Seizure     Stroke 2000       BP 102/68 (BP Site: L Arm, BP Position: Sitting, BP Cuff Size: Large)  - Pulse 104  - Temp 36.5 ??C (97.7 ??F) (Temporal)  - Wt 78.9 kg (174 lb)  - LMP  (LMP Unknown)  - BMI 31.83 kg/m??     Physical Exam  Vitals reviewed.   Constitutional:       General: She is not in acute distress.  HENT:      Head: Normocephalic and atraumatic.      Right Ear: Tympanic membrane, ear canal and external ear normal. Decreased hearing noted. No drainage, swelling or tenderness. There is no impacted cerumen. No foreign body.      Left Ear: Hearing, tympanic membrane, ear canal and external ear normal. No drainage, swelling or tenderness. There is no impacted cerumen. No foreign body.      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   Eyes:      Extraocular Movements: Extraocular movements intact.      Conjunctiva/sclera: Conjunctivae normal.      Pupils: Pupils are equal, round, and reactive to light.   Pulmonary:      Effort: Pulmonary effort is normal.  Skin:     General: Skin is warm and dry.   Neurological:      General: No focal deficit present.      Mental Status: She is alert and oriented to person, place, and time.   Psychiatric:         Mood and Affect: Mood normal.         Behavior: Behavior normal.           [facility]  at [facility]  [address], [place], [place] [address]  Telephone [phone]  Fax [phone]  [email or web address]

## 2024-01-29 NOTE — Unmapped (Signed)
 I saw and evaluated the patient, participating in the key portions of the service.  I reviewed Dr. Danie Duran note.  I agree with his findings and plan. Cheryl Corporal, MD

## 2024-01-30 DIAGNOSIS — N186 End stage renal disease: Principal | ICD-10-CM

## 2024-01-30 NOTE — Unmapped (Signed)
 Faxed Kidney Txp Referral Received - letter and consent sent

## 2024-02-05 DIAGNOSIS — T82858A Stenosis of vascular prosthetic devices, implants and grafts, initial encounter: Principal | ICD-10-CM

## 2024-02-10 MED ORDER — PREDNISONE 50 MG TABLET
ORAL_TABLET | ORAL | 0 refills | 0.00 days | Status: CP
Start: 2024-02-10 — End: ?

## 2024-02-12 ENCOUNTER — Inpatient Hospital Stay: Admit: 2024-02-12 | Discharge: 2024-02-13 | Payer: Medicare (Managed Care)

## 2024-03-02 DIAGNOSIS — Z91041 Radiographic dye allergy status: Principal | ICD-10-CM

## 2024-03-02 DIAGNOSIS — Z992 Dependence on renal dialysis: Principal | ICD-10-CM

## 2024-03-02 DIAGNOSIS — N186 End stage renal disease: Principal | ICD-10-CM

## 2024-03-02 MED ORDER — PREDNISONE 20 MG TABLET
ORAL_TABLET | ORAL | 0 refills | 0.00000 days | Status: CP
Start: 2024-03-02 — End: ?

## 2024-03-02 MED ORDER — DIPHENHYDRAMINE 50 MG TABLET
ORAL_TABLET | ORAL | 0 refills | 30.00000 days | Status: CP
Start: 2024-03-02 — End: 2024-04-01

## 2024-03-02 MED ORDER — PREDNISONE 50 MG TABLET
ORAL_TABLET | ORAL | 0 refills | 0.00000 days | Status: CP
Start: 2024-03-02 — End: ?

## 2024-03-03 ENCOUNTER — Inpatient Hospital Stay: Admit: 2024-03-03 | Discharge: 2024-03-04 | Payer: Medicare (Managed Care)

## 2024-03-04 DIAGNOSIS — D509 Iron deficiency anemia, unspecified: Principal | ICD-10-CM

## 2024-03-09 DIAGNOSIS — R0789 Other chest pain: Principal | ICD-10-CM

## 2024-03-09 MED ORDER — ALBUTEROL SULFATE HFA 90 MCG/ACTUATION AEROSOL INHALER
Freq: Four times a day (QID) | RESPIRATORY_TRACT | 3 refills | 0.00000 days | PRN
Start: 2024-03-09 — End: 2025-03-09

## 2024-03-09 MED ORDER — QVAR REDIHALER 80 MCG/ACTUATION HFA BREATH ACTIVATED AEROSOL
Freq: Two times a day (BID) | RESPIRATORY_TRACT | 3 refills | 0.00000 days
Start: 2024-03-09 — End: ?

## 2024-03-11 ENCOUNTER — Ambulatory Visit: Admit: 2024-03-11 | Discharge: 2024-03-12 | Payer: Medicare (Managed Care)

## 2024-03-11 MED ORDER — QVAR REDIHALER 80 MCG/ACTUATION HFA BREATH ACTIVATED AEROSOL
Freq: Two times a day (BID) | RESPIRATORY_TRACT | 3 refills | 0.00000 days | Status: CP
Start: 2024-03-11 — End: ?

## 2024-03-12 DIAGNOSIS — N186 End stage renal disease: Principal | ICD-10-CM

## 2024-03-12 MED ORDER — PREDNISONE 50 MG TABLET
ORAL_TABLET | ORAL | 0 refills | 0.00000 days | Status: CP
Start: 2024-03-12 — End: ?

## 2024-03-12 MED ORDER — DIPHENHYDRAMINE 50 MG CAPSULE
ORAL_CAPSULE | Freq: Once | ORAL | 0 refills | 1.00000 days | Status: CP
Start: 2024-03-12 — End: 2024-03-12

## 2024-03-12 MED ORDER — ALBUTEROL SULFATE HFA 90 MCG/ACTUATION AEROSOL INHALER
Freq: Four times a day (QID) | RESPIRATORY_TRACT | 3 refills | 0.00000 days | Status: CP | PRN
Start: 2024-03-12 — End: 2025-03-12

## 2024-03-13 ENCOUNTER — Inpatient Hospital Stay: Admit: 2024-03-13 | Discharge: 2024-03-14 | Payer: Medicare (Managed Care)

## 2024-03-13 MED ORDER — DIPHENHYDRAMINE 50 MG TABLET
ORAL_TABLET | ORAL | 0 refills | 30.00000 days | Status: CP
Start: 2024-03-13 — End: 2024-04-12

## 2024-03-13 MED ORDER — PREDNISONE 50 MG TABLET
ORAL_TABLET | ORAL | 0 refills | 0.00000 days | Status: CP
Start: 2024-03-13 — End: ?

## 2024-03-18 ENCOUNTER — Inpatient Hospital Stay: Admit: 2024-03-18 | Discharge: 2024-03-19 | Payer: Medicare (Managed Care)

## 2024-03-18 ENCOUNTER — Ambulatory Visit: Admit: 2024-03-18 | Discharge: 2024-03-19 | Payer: Medicare (Managed Care)

## 2024-03-21 DIAGNOSIS — J4530 Mild persistent asthma, uncomplicated: Principal | ICD-10-CM

## 2024-03-21 MED ORDER — ALBUTEROL SULFATE HFA 90 MCG/ACTUATION AEROSOL INHALER
Freq: Four times a day (QID) | RESPIRATORY_TRACT | 3 refills | 0.00000 days | Status: CP | PRN
Start: 2024-03-21 — End: 2025-03-21

## 2024-04-03 DIAGNOSIS — Z992 Dependence on renal dialysis: Principal | ICD-10-CM

## 2024-04-03 DIAGNOSIS — N186 End stage renal disease: Principal | ICD-10-CM

## 2024-04-08 ENCOUNTER — Ambulatory Visit: Admit: 2024-04-08 | Discharge: 2024-04-09 | Payer: Medicare (Managed Care)

## 2024-04-08 DIAGNOSIS — I2089 Stable angina (CMS-HCC): Principal | ICD-10-CM

## 2024-04-08 DIAGNOSIS — R9431 Abnormal electrocardiogram [ECG] [EKG]: Principal | ICD-10-CM

## 2024-04-08 DIAGNOSIS — I10 Essential (primary) hypertension: Principal | ICD-10-CM

## 2024-04-08 DIAGNOSIS — Z992 Dependence on renal dialysis: Principal | ICD-10-CM

## 2024-04-08 DIAGNOSIS — N2581 Secondary hyperparathyroidism of renal origin: Principal | ICD-10-CM

## 2024-04-08 DIAGNOSIS — N186 End stage renal disease: Principal | ICD-10-CM

## 2024-04-08 DIAGNOSIS — D696 Thrombocytopenia, unspecified: Principal | ICD-10-CM

## 2024-04-08 DIAGNOSIS — G4733 Obstructive sleep apnea (adult) (pediatric): Principal | ICD-10-CM

## 2024-04-08 DIAGNOSIS — J4530 Mild persistent asthma, uncomplicated: Principal | ICD-10-CM

## 2024-04-08 MED ORDER — PEG 3350-ELECTROLYTES 236 GRAM-22.74 GRAM-6.74 GRAM-5.86 GRAM SOLUTION
ORAL | 1 refills | 0.00000 days | Status: CP
Start: 2024-04-08 — End: ?

## 2024-04-09 ENCOUNTER — Encounter: Admit: 2024-04-09 | Discharge: 2024-04-17 | Payer: Medicare (Managed Care)

## 2024-04-09 ENCOUNTER — Inpatient Hospital Stay: Admit: 2024-04-09 | Discharge: 2024-04-11 | Payer: Medicare (Managed Care) | Admitting: Acute Care

## 2024-04-09 ENCOUNTER — Encounter: Admit: 2024-04-09 | Payer: Medicare (Managed Care)

## 2024-04-09 ENCOUNTER — Ambulatory Visit: Admit: 2024-04-09 | Payer: Medicare (Managed Care)

## 2024-04-09 ENCOUNTER — Inpatient Hospital Stay
Admit: 2024-04-09 | Discharge: 2024-04-17 | Disposition: A | Discharge status: Home Health | Payer: Medicare (Managed Care) | Admitting: Student in an Organized Health Care Education/Training Program

## 2024-04-09 ENCOUNTER — Encounter
Admit: 2024-04-09 | Discharge: 2024-04-17 | Disposition: A | Discharge status: Home Health | Payer: Medicare (Managed Care) | Attending: Student in an Organized Health Care Education/Training Program | Admitting: Student in an Organized Health Care Education/Training Program

## 2024-04-09 ENCOUNTER — Ambulatory Visit: Admit: 2024-04-09 | Discharge: 2024-04-17 | Payer: Medicare (Managed Care)

## 2024-04-09 ENCOUNTER — Ambulatory Visit
Admit: 2024-04-09 | Discharge: 2024-04-17 | Disposition: A | Discharge status: Home Health | Payer: Medicare (Managed Care) | Admitting: Student in an Organized Health Care Education/Training Program

## 2024-04-09 ENCOUNTER — Encounter
Admit: 2024-04-09 | Discharge: 2024-04-17 | Payer: Medicare (Managed Care) | Attending: Student in an Organized Health Care Education/Training Program

## 2024-04-16 DIAGNOSIS — N186 End stage renal disease: Principal | ICD-10-CM

## 2024-04-16 MED ORDER — CINACALCET 90 MG TABLET
ORAL_TABLET | Freq: Every day | ORAL | 2 refills | 30.00000 days
Start: 2024-04-16 — End: 2024-07-15

## 2024-04-16 MED ORDER — HYDROCORTISONE 1 % TOPICAL CREAM
Freq: Three times a day (TID) | TOPICAL | 0 refills | 0.00000 days | PRN
Start: 2024-04-16 — End: 2025-04-16

## 2024-04-17 MED ORDER — HYDROCORTISONE 1 % TOPICAL CREAM
Freq: Three times a day (TID) | TOPICAL | 0 refills | 0.00000 days | Status: CN | PRN
Start: 2024-04-17 — End: 2025-04-17

## 2024-04-17 MED ORDER — ATORVASTATIN 80 MG TABLET
ORAL_TABLET | Freq: Every day | ORAL | 0 refills | 90.00000 days | Status: CN
Start: 2024-04-17 — End: 2024-07-16

## 2024-04-17 MED ORDER — CINACALCET 90 MG TABLET
ORAL_TABLET | Freq: Every day | ORAL | 2 refills | 30.00000 days | Status: CP
Start: 2024-04-17 — End: 2024-07-16

## 2024-04-18 MED ORDER — CINACALCET 90 MG TABLET
ORAL_TABLET | Freq: Every day | ORAL | 2 refills | 30.00000 days | Status: CN
Start: 2024-04-18 — End: 2024-07-17

## 2024-04-29 ENCOUNTER — Ambulatory Visit: Admit: 2024-04-29 | Discharge: 2024-04-30 | Payer: Medicare (Managed Care)

## 2024-05-06 DIAGNOSIS — T82898A Other specified complication of vascular prosthetic devices, implants and grafts, initial encounter: Principal | ICD-10-CM

## 2024-05-13 ENCOUNTER — Ambulatory Visit: Admit: 2024-05-13 | Discharge: 2024-05-14 | Payer: Medicare (Managed Care)

## 2024-05-25 ENCOUNTER — Encounter
Admit: 2024-05-25 | Discharge: 2024-05-25 | Disposition: A | Discharge status: Home / Self Care | Payer: Medicare (Managed Care) | Attending: Student in an Organized Health Care Education/Training Program

## 2024-05-25 ENCOUNTER — Inpatient Hospital Stay: Admit: 2024-05-25 | Discharge: 2024-05-25 | Disposition: A | Discharge status: Home / Self Care | Payer: MEDICARE

## 2024-05-27 ENCOUNTER — Inpatient Hospital Stay: Admit: 2024-05-27 | Discharge: 2024-05-29 | Payer: Medicare (Managed Care)

## 2024-05-27 ENCOUNTER — Ambulatory Visit: Admit: 2024-05-27 | Discharge: 2024-05-29 | Payer: Medicare (Managed Care)

## 2024-05-28 ENCOUNTER — Ambulatory Visit: Admit: 2024-05-28 | Payer: Medicare (Managed Care)

## 2024-05-29 MED ORDER — OXYCODONE 5 MG TABLET
ORAL_TABLET | Freq: Four times a day (QID) | ORAL | 0 refills | 2.00000 days | Status: CP | PRN
Start: 2024-05-29 — End: ?
  Filled 2024-05-29: qty 5, 2d supply, fill #0

## 2024-05-29 MED ORDER — ACETAMINOPHEN 325 MG TABLET
ORAL_TABLET | Freq: Four times a day (QID) | ORAL | 0 refills | 5.00000 days | Status: CP
Start: 2024-05-29 — End: 2024-06-03
  Filled 2024-05-29: qty 40, 5d supply, fill #0

## 2024-05-29 MED ORDER — CALCITRIOL 0.5 MCG CAPSULE
ORAL_CAPSULE | Freq: Two times a day (BID) | ORAL | 11 refills | 30.00000 days | Status: CP
Start: 2024-05-29 — End: 2025-05-29
  Filled 2024-05-29: qty 28, 7d supply, fill #0

## 2024-05-29 MED ORDER — CALCIUM 200 MG (AS CALCIUM CARBONATE 500 MG) CHEWABLE TABLET
ORAL_TABLET | Freq: Four times a day (QID) | ORAL | 2 refills | 30.00000 days | Status: CP
Start: 2024-05-29 — End: 2024-08-27
  Filled 2024-05-29: qty 150, 10d supply, fill #0

## 2024-06-02 MED ORDER — PEG 3350-ELECTROLYTES 236 GRAM-22.74 GRAM-6.74 GRAM-5.86 GRAM SOLUTION
ORAL | 1 refills | 0.00000 days | Status: CP
Start: 2024-06-02 — End: ?

## 2024-06-04 ENCOUNTER — Inpatient Hospital Stay: Admit: 2024-06-04 | Discharge: 2024-06-04 | Payer: Medicare (Managed Care)

## 2024-06-16 ENCOUNTER — Encounter: Admit: 2024-06-16 | Discharge: 2024-06-17 | Payer: Medicare (Managed Care)

## 2024-06-16 ENCOUNTER — Ambulatory Visit: Admit: 2024-06-16 | Discharge: 2024-06-17 | Payer: Medicare (Managed Care)

## 2024-06-16 DIAGNOSIS — N2581 Secondary hyperparathyroidism of renal origin: Principal | ICD-10-CM

## 2024-06-16 DIAGNOSIS — Z992 Dependence on renal dialysis: Principal | ICD-10-CM

## 2024-06-16 DIAGNOSIS — N186 End stage renal disease: Principal | ICD-10-CM

## 2024-06-16 DIAGNOSIS — E890 Postprocedural hypothyroidism: Principal | ICD-10-CM

## 2024-06-16 DIAGNOSIS — E212 Other hyperparathyroidism: Principal | ICD-10-CM

## 2024-06-16 MED ORDER — LEVOTHYROXINE 75 MCG TABLET
ORAL_TABLET | Freq: Every day | ORAL | 3 refills | 100.00000 days | Status: CP
Start: 2024-06-16 — End: 2025-07-21

## 2024-06-17 ENCOUNTER — Encounter
Admit: 2024-06-17 | Discharge: 2024-06-17 | Payer: Medicare (Managed Care) | Attending: Student in an Organized Health Care Education/Training Program | Primary: Student in an Organized Health Care Education/Training Program

## 2024-06-17 ENCOUNTER — Inpatient Hospital Stay: Admit: 2024-06-17 | Discharge: 2024-06-17 | Payer: Medicare (Managed Care)

## 2024-06-21 ENCOUNTER — Inpatient Hospital Stay: Admit: 2024-06-21 | Discharge: 2024-06-21 | Payer: Medicare (Managed Care)

## 2024-06-30 ENCOUNTER — Inpatient Hospital Stay: Admit: 2024-06-30 | Discharge: 2024-06-30 | Payer: Medicare (Managed Care)

## 2024-07-07 DIAGNOSIS — M8580 Other specified disorders of bone density and structure, unspecified site: Principal | ICD-10-CM

## 2024-07-08 DIAGNOSIS — Z9889 Other specified postprocedural states: Principal | ICD-10-CM

## 2024-07-08 DIAGNOSIS — Z9089 Acquired absence of other organs: Principal | ICD-10-CM

## 2024-07-08 MED ORDER — CALCIUM 200 MG (AS CALCIUM CARBONATE 500 MG) CHEWABLE TABLET
ORAL_TABLET | Freq: Three times a day (TID) | ORAL | 2 refills | 30.00000 days
Start: 2024-07-08 — End: 2024-10-06

## 2024-07-09 MED ORDER — CALCIUM 200 MG (AS CALCIUM CARBONATE 500 MG) CHEWABLE TABLET
ORAL_TABLET | Freq: Three times a day (TID) | ORAL | 2 refills | 30.00000 days | Status: CP
Start: 2024-07-09 — End: 2024-10-07

## 2024-07-14 ENCOUNTER — Inpatient Hospital Stay: Admit: 2024-07-14 | Discharge: 2024-07-14 | Payer: Medicare (Managed Care)

## 2024-07-21 ENCOUNTER — Ambulatory Visit
Admission: EM | Admit: 2024-07-21 | Discharge: 2024-07-27 | Disposition: A | Discharge status: Home Health | Payer: Medicare (Managed Care)

## 2024-07-21 ENCOUNTER — Ambulatory Visit: Admit: 2024-07-21 | Discharge: 2024-07-27 | Payer: Medicare (Managed Care)

## 2024-07-21 ENCOUNTER — Inpatient Hospital Stay
Admission: EM | Admit: 2024-07-21 | Discharge: 2024-07-27 | Disposition: A | Discharge status: Home Health | Payer: Medicare (Managed Care)

## 2024-07-27 MED ORDER — FUROSEMIDE 40 MG TABLET
ORAL_TABLET | Freq: Every day | ORAL | 0 refills | 15.00000 days | Status: CP | PRN
Start: 2024-07-27 — End: ?
  Filled 2024-07-27: qty 15, 15d supply, fill #0

## 2024-07-27 MED ORDER — LEVOFLOXACIN 500 MG TABLET
ORAL_TABLET | ORAL | 0 refills | 7.00000 days | Status: CP
Start: 2024-07-27 — End: 2024-08-03
  Filled 2024-07-27: qty 2, 7d supply, fill #0

## 2024-07-27 MED FILL — CALCITRIOL 0.5 MCG CAPSULE: ORAL | 30 days supply | Qty: 30 | Fill #0

## 2024-07-28 DIAGNOSIS — N186 End stage renal disease: Principal | ICD-10-CM

## 2024-07-28 MED ORDER — CALCITRIOL 0.5 MCG CAPSULE
ORAL_CAPSULE | Freq: Every day | ORAL | 0 refills | 30.00000 days
Start: 2024-07-28 — End: 2025-07-28

## 2024-08-05 DIAGNOSIS — N186 End stage renal disease: Principal | ICD-10-CM

## 2024-08-05 DIAGNOSIS — R609 Edema, unspecified: Principal | ICD-10-CM

## 2024-08-05 MED ORDER — CALCITRIOL 0.5 MCG CAPSULE
ORAL_CAPSULE | Freq: Every day | ORAL | 11 refills | 30.00000 days
Start: 2024-08-05 — End: 2025-08-05

## 2024-08-05 MED ORDER — BUMETANIDE 2 MG TABLET
ORAL_TABLET | Freq: Every day | ORAL | 3 refills | 100.00000 days | Status: CP
Start: 2024-08-05 — End: 2025-09-09

## 2024-08-07 ENCOUNTER — Inpatient Hospital Stay
Admission: EM | Admit: 2024-08-07 | Discharge: 2024-08-09 | Disposition: A | Discharge status: Home / Self Care | Payer: Medicare (Managed Care)

## 2024-08-07 ENCOUNTER — Encounter
Admission: EM | Admit: 2024-08-07 | Discharge: 2024-08-09 | Disposition: A | Discharge status: Home / Self Care | Payer: Medicare (Managed Care)

## 2024-08-07 ENCOUNTER — Ambulatory Visit: Admission: EM | Admit: 2024-08-07 | Disposition: A | Discharge status: Home / Self Care | Payer: Medicare (Managed Care)

## 2024-08-07 ENCOUNTER — Ambulatory Visit
Admission: EM | Admit: 2024-08-07 | Discharge: 2024-08-09 | Disposition: A | Discharge status: Home / Self Care | Payer: Medicare (Managed Care)

## 2024-08-09 MED ORDER — OXYCODONE 5 MG TABLET
ORAL_TABLET | Freq: Four times a day (QID) | ORAL | 0 refills | 5.00000 days | Status: CP | PRN
Start: 2024-08-09 — End: 2024-08-14

## 2024-08-09 MED ORDER — APIXABAN 5 MG TABLET
ORAL_TABLET | ORAL | 0 refills | 36.00000 days | Status: CP
Start: 2024-08-09 — End: 2024-09-14

## 2024-08-09 MED ORDER — MIRCERA 150 MCG/0.3 ML INJECTION SYRINGE
INTRAVENOUS | 0 refills | 0.00000 days
Start: 2024-08-09 — End: ?

## 2024-08-12 ENCOUNTER — Inpatient Hospital Stay: Admit: 2024-08-12 | Discharge: 2024-08-12 | Payer: Medicare (Managed Care)

## 2024-08-15 MED ORDER — ASPIRIN 81 MG CHEWABLE TABLET
ORAL_TABLET | Freq: Every day | ORAL | 2 refills | 30.00000 days | Status: CP
Start: 2024-08-15 — End: 2024-11-13

## 2024-08-21 DIAGNOSIS — G4733 Obstructive sleep apnea (adult) (pediatric): Principal | ICD-10-CM

## 2024-08-21 DIAGNOSIS — N186 End stage renal disease: Principal | ICD-10-CM

## 2024-08-21 DIAGNOSIS — I2699 Other pulmonary embolism without acute cor pulmonale: Principal | ICD-10-CM

## 2024-08-21 DIAGNOSIS — J4530 Mild persistent asthma, uncomplicated: Principal | ICD-10-CM

## 2024-08-26 ENCOUNTER — Inpatient Hospital Stay: Admit: 2024-08-26 | Discharge: 2024-08-26 | Payer: Medicare (Managed Care)

## 2024-08-28 MED ORDER — MIRTAZAPINE 15 MG TABLET
ORAL_TABLET | Freq: Every evening | ORAL | 0 refills | 30.00000 days | Status: CP
Start: 2024-08-28 — End: 2024-08-28

## 2024-09-03 ENCOUNTER — Inpatient Hospital Stay: Admit: 2024-09-03 | Discharge: 2024-09-03 | Payer: Medicare (Managed Care)

## 2024-09-03 ENCOUNTER — Encounter
Admit: 2024-09-03 | Discharge: 2024-09-03 | Payer: Medicare (Managed Care) | Attending: Student in an Organized Health Care Education/Training Program | Primary: Student in an Organized Health Care Education/Training Program

## 2024-09-03 MED ORDER — OXYCODONE 5 MG TABLET
ORAL_TABLET | Freq: Four times a day (QID) | ORAL | 0 refills | 3.00000 days | Status: CP | PRN
Start: 2024-09-03 — End: 2024-09-08

## 2024-09-08 ENCOUNTER — Ambulatory Visit: Admit: 2024-09-08 | Discharge: 2024-09-08 | Payer: Medicare (Managed Care)

## 2024-09-08 DIAGNOSIS — N2581 Secondary hyperparathyroidism of renal origin: Principal | ICD-10-CM

## 2024-09-08 DIAGNOSIS — E890 Postprocedural hypothyroidism: Principal | ICD-10-CM

## 2024-09-08 DIAGNOSIS — N186 End stage renal disease: Principal | ICD-10-CM

## 2024-09-08 DIAGNOSIS — Z992 Dependence on renal dialysis: Principal | ICD-10-CM

## 2024-09-22 DIAGNOSIS — Z95828 Presence of other vascular implants and grafts: Principal | ICD-10-CM

## 2024-09-30 ENCOUNTER — Ambulatory Visit: Admit: 2024-09-30 | Discharge: 2024-10-01 | Payer: Medicare (Managed Care)

## 2024-09-30 DIAGNOSIS — R7989 Other specified abnormal findings of blood chemistry: Principal | ICD-10-CM

## 2024-09-30 DIAGNOSIS — E039 Hypothyroidism, unspecified: Principal | ICD-10-CM

## 2024-09-30 MED ORDER — MIRTAZAPINE 15 MG TABLET
ORAL_TABLET | Freq: Every evening | ORAL | 2 refills | 90.00000 days | Status: CP
Start: 2024-09-30 — End: ?

## 2024-09-30 MED ORDER — CLOBETASOL 0.05 % TOPICAL OINTMENT
Freq: Two times a day (BID) | TOPICAL | 2 refills | 0.00000 days | Status: CP
Start: 2024-09-30 — End: 2025-09-30

## 2024-10-01 DIAGNOSIS — E890 Postprocedural hypothyroidism: Principal | ICD-10-CM

## 2024-10-01 MED ORDER — LEVOTHYROXINE 100 MCG TABLET
ORAL_TABLET | Freq: Every day | ORAL | 1 refills | 100.00000 days | Status: CP
Start: 2024-10-01 — End: 2025-11-05

## 2024-10-02 ENCOUNTER — Ambulatory Visit: Admit: 2024-10-02 | Discharge: 2024-10-03 | Payer: Medicare (Managed Care)

## 2024-10-02 ENCOUNTER — Inpatient Hospital Stay: Admit: 2024-10-02 | Discharge: 2024-10-03 | Payer: Medicare (Managed Care)

## 2024-10-03 MED ORDER — CLOBETASOL 0.05 % TOPICAL OINTMENT
Freq: Two times a day (BID) | TOPICAL | 2 refills | 0.00000 days | Status: CP
Start: 2024-10-03 — End: 2025-10-03
  Filled 2024-10-03: qty 60, 30d supply, fill #0

## 2024-10-03 MED ORDER — MIDODRINE 5 MG TABLET
ORAL_TABLET | Freq: Every day | ORAL | 0 refills | 30.00000 days | Status: CP | PRN
Start: 2024-10-03 — End: 2024-11-02

## 2024-10-03 MED ORDER — TRIAMCINOLONE ACETONIDE 0.1 % TOPICAL CREAM
INTRAMUSCULAR | 0 refills | 0.00000 days | Status: CP
Start: 2024-10-03 — End: ?
  Filled 2024-10-03: qty 454, 30d supply, fill #0

## 2024-10-03 MED ORDER — APIXABAN 5 MG TABLET
ORAL_TABLET | ORAL | 0 refills | 36.00000 days | Status: CP
Start: 2024-10-03 — End: 2024-11-08
  Filled 2024-10-03: qty 72, 30d supply, fill #0

## 2024-10-07 DIAGNOSIS — Z992 Dependence on renal dialysis: Principal | ICD-10-CM

## 2024-10-07 DIAGNOSIS — R7989 Other specified abnormal findings of blood chemistry: Principal | ICD-10-CM

## 2024-10-07 DIAGNOSIS — N186 End stage renal disease: Principal | ICD-10-CM

## 2024-10-07 DIAGNOSIS — Z789 Other specified health status: Principal | ICD-10-CM

## 2024-10-09 ENCOUNTER — Inpatient Hospital Stay: Admit: 2024-10-09 | Discharge: 2024-10-09 | Payer: Medicare (Managed Care)

## 2024-10-26 ENCOUNTER — Ambulatory Visit: Admit: 2024-10-26 | Discharge: 2024-10-27 | Payer: Medicare (Managed Care)

## 2024-10-26 DIAGNOSIS — Z992 Dependence on renal dialysis: Principal | ICD-10-CM

## 2024-10-26 DIAGNOSIS — N186 End stage renal disease: Principal | ICD-10-CM

## 2024-10-26 DIAGNOSIS — E039 Hypothyroidism, unspecified: Principal | ICD-10-CM

## 2024-10-26 MED ORDER — BUDESONIDE-FORMOTEROL HFA 80 MCG-4.5 MCG/ACTUATION AEROSOL INHALER
Freq: Two times a day (BID) | RESPIRATORY_TRACT | 11 refills | 61.00000 days | Status: CP
Start: 2024-10-26 — End: ?

## 2024-10-28 MED ORDER — APIXABAN 5 MG TABLET
ORAL_TABLET | Freq: Two times a day (BID) | ORAL | 3 refills | 90.00000 days | Status: CP
Start: 2024-10-28 — End: ?

## 2024-11-02 ENCOUNTER — Ambulatory Visit: Admit: 2024-11-02 | Discharge: 2024-11-03 | Payer: Medicare (Managed Care)

## 2024-11-11 DIAGNOSIS — L409 Psoriasis, unspecified: Principal | ICD-10-CM

## 2024-11-11 MED ORDER — TRIAMCINOLONE ACETONIDE 0.1 % TOPICAL OINTMENT
5 refills | 0.00000 days | Status: CP
Start: 2024-11-11 — End: ?
